# Patient Record
Sex: Male | Born: 1950 | Race: White | Hispanic: No | Marital: Married | Smoking: Former smoker
Health system: Southern US, Community
[De-identification: ages and names within clinical notes are randomized; demographics above are authoritative.]

## PROBLEM LIST (undated history)

## (undated) DIAGNOSIS — Z87442 Personal history of urinary calculi: Secondary | ICD-10-CM

## (undated) DIAGNOSIS — K219 Gastro-esophageal reflux disease without esophagitis: Secondary | ICD-10-CM

## (undated) DIAGNOSIS — I4891 Unspecified atrial fibrillation: Secondary | ICD-10-CM

## (undated) DIAGNOSIS — I350 Nonrheumatic aortic (valve) stenosis: Secondary | ICD-10-CM

## (undated) DIAGNOSIS — H33311 Horseshoe tear of retina without detachment, right eye: Secondary | ICD-10-CM

## (undated) DIAGNOSIS — C801 Malignant (primary) neoplasm, unspecified: Secondary | ICD-10-CM

## (undated) DIAGNOSIS — G8929 Other chronic pain: Secondary | ICD-10-CM

## (undated) DIAGNOSIS — R609 Edema, unspecified: Secondary | ICD-10-CM

## (undated) DIAGNOSIS — R197 Diarrhea, unspecified: Secondary | ICD-10-CM

## (undated) DIAGNOSIS — I255 Ischemic cardiomyopathy: Secondary | ICD-10-CM

## (undated) DIAGNOSIS — I209 Angina pectoris, unspecified: Secondary | ICD-10-CM

## (undated) DIAGNOSIS — I10 Essential (primary) hypertension: Secondary | ICD-10-CM

## (undated) DIAGNOSIS — M549 Dorsalgia, unspecified: Secondary | ICD-10-CM

## (undated) DIAGNOSIS — M1990 Unspecified osteoarthritis, unspecified site: Secondary | ICD-10-CM

## (undated) DIAGNOSIS — I4901 Ventricular fibrillation: Secondary | ICD-10-CM

## (undated) DIAGNOSIS — IMO0001 Reserved for inherently not codable concepts without codable children: Secondary | ICD-10-CM

## (undated) DIAGNOSIS — Z973 Presence of spectacles and contact lenses: Secondary | ICD-10-CM

## (undated) DIAGNOSIS — J986 Disorders of diaphragm: Secondary | ICD-10-CM

## (undated) DIAGNOSIS — E119 Type 2 diabetes mellitus without complications: Secondary | ICD-10-CM

## (undated) DIAGNOSIS — E785 Hyperlipidemia, unspecified: Secondary | ICD-10-CM

## (undated) DIAGNOSIS — I219 Acute myocardial infarction, unspecified: Secondary | ICD-10-CM

## (undated) DIAGNOSIS — R011 Cardiac murmur, unspecified: Secondary | ICD-10-CM

## (undated) DIAGNOSIS — I2510 Atherosclerotic heart disease of native coronary artery without angina pectoris: Secondary | ICD-10-CM

## (undated) HISTORY — PX: BACK SURGERY: SHX140

## (undated) HISTORY — PX: CORONARY STENT PLACEMENT: SHX1402

## (undated) HISTORY — PX: LITHOTRIPSY: SUR834

## (undated) HISTORY — PX: TONSILLECTOMY: SUR1361

## (undated) HISTORY — PX: HAND SURGERY: SHX662

## (undated) HISTORY — DX: Malignant (primary) neoplasm, unspecified: C80.1

## (undated) HISTORY — PX: EYE EXAMINATION UNDER ANESTHESIA W/ RETINAL CRYOTHERAPY AND RETINAL LASER: SHX1561

## (undated) HISTORY — PX: CARDIAC CATHETERIZATION: SHX172

---

## 1995-05-31 HISTORY — PX: BACK SURGERY: SHX140

## 1997-10-30 ENCOUNTER — Ambulatory Visit (HOSPITAL_COMMUNITY): Admission: RE | Admit: 1997-10-30 | Discharge: 1997-10-30 | Payer: Self-pay | Admitting: Neurosurgery

## 2001-11-15 ENCOUNTER — Other Ambulatory Visit: Admission: RE | Admit: 2001-11-15 | Discharge: 2001-11-15 | Payer: Self-pay | Admitting: Dermatology

## 2003-05-31 DIAGNOSIS — I219 Acute myocardial infarction, unspecified: Secondary | ICD-10-CM

## 2003-05-31 HISTORY — DX: Acute myocardial infarction, unspecified: I21.9

## 2003-06-23 ENCOUNTER — Inpatient Hospital Stay (HOSPITAL_COMMUNITY): Admission: EM | Admit: 2003-06-23 | Discharge: 2003-06-30 | Payer: Self-pay | Admitting: *Deleted

## 2005-07-14 DIAGNOSIS — M545 Low back pain, unspecified: Secondary | ICD-10-CM | POA: Insufficient documentation

## 2006-12-08 DIAGNOSIS — R079 Chest pain, unspecified: Secondary | ICD-10-CM | POA: Insufficient documentation

## 2012-10-10 ENCOUNTER — Inpatient Hospital Stay (HOSPITAL_COMMUNITY)
Admission: AD | Admit: 2012-10-10 | Discharge: 2012-10-13 | DRG: 247 | Disposition: A | Discharge status: Home / Self Care | Payer: Medicare Other | Source: Other Acute Inpatient Hospital | Attending: Cardiology | Admitting: Cardiology

## 2012-10-10 ENCOUNTER — Encounter (HOSPITAL_COMMUNITY): Payer: Self-pay | Admitting: Family Medicine

## 2012-10-10 DIAGNOSIS — I214 Non-ST elevation (NSTEMI) myocardial infarction: Secondary | ICD-10-CM

## 2012-10-10 DIAGNOSIS — R001 Bradycardia, unspecified: Secondary | ICD-10-CM | POA: Diagnosis present

## 2012-10-10 DIAGNOSIS — Z8674 Personal history of sudden cardiac arrest: Secondary | ICD-10-CM

## 2012-10-10 DIAGNOSIS — I10 Essential (primary) hypertension: Secondary | ICD-10-CM

## 2012-10-10 DIAGNOSIS — I2510 Atherosclerotic heart disease of native coronary artery without angina pectoris: Principal | ICD-10-CM | POA: Diagnosis present

## 2012-10-10 DIAGNOSIS — Z955 Presence of coronary angioplasty implant and graft: Secondary | ICD-10-CM

## 2012-10-10 DIAGNOSIS — Z87891 Personal history of nicotine dependence: Secondary | ICD-10-CM

## 2012-10-10 DIAGNOSIS — Z9861 Coronary angioplasty status: Secondary | ICD-10-CM | POA: Diagnosis present

## 2012-10-10 DIAGNOSIS — I200 Unstable angina: Secondary | ICD-10-CM | POA: Diagnosis present

## 2012-10-10 DIAGNOSIS — I252 Old myocardial infarction: Secondary | ICD-10-CM

## 2012-10-10 DIAGNOSIS — I2589 Other forms of chronic ischemic heart disease: Secondary | ICD-10-CM | POA: Diagnosis present

## 2012-10-10 HISTORY — DX: Ischemic cardiomyopathy: I25.5

## 2012-10-10 HISTORY — DX: Angina pectoris, unspecified: I20.9

## 2012-10-10 HISTORY — DX: Atherosclerotic heart disease of native coronary artery without angina pectoris: I25.10

## 2012-10-10 HISTORY — DX: Unspecified osteoarthritis, unspecified site: M19.90

## 2012-10-10 HISTORY — DX: Disorders of diaphragm: J98.6

## 2012-10-10 HISTORY — DX: Acute myocardial infarction, unspecified: I21.9

## 2012-10-10 HISTORY — DX: Ventricular fibrillation: I49.01

## 2012-10-10 MED ORDER — NITROGLYCERIN 2 % TD OINT
0.5000 [in_us] | TOPICAL_OINTMENT | Freq: Four times a day (QID) | TRANSDERMAL | Status: DC
  Administered 2012-10-10 – 2012-10-11 (×2): 0.5 [in_us] via TOPICAL
  Filled 2012-10-10: qty 30

## 2012-10-10 MED ORDER — SODIUM CHLORIDE 0.9 % IJ SOLN: 3.0000 mL | INTRAMUSCULAR | Status: DC | PRN

## 2012-10-10 MED ORDER — SODIUM CHLORIDE 0.9 % IV SOLN
1.0000 mL/kg/h | INTRAVENOUS | Status: DC
  Administered 2012-10-11: 1 mL/kg/h via INTRAVENOUS

## 2012-10-10 MED ORDER — NITROGLYCERIN 0.4 MG SL SUBL: 0.4000 mg | SUBLINGUAL_TABLET | SUBLINGUAL | Status: DC | PRN

## 2012-10-10 MED ORDER — ONDANSETRON HCL 4 MG/2ML IJ SOLN: 4.0000 mg | Freq: Four times a day (QID) | INTRAMUSCULAR | Status: DC | PRN

## 2012-10-10 MED ORDER — ACETAMINOPHEN 325 MG PO TABS: 650.0000 mg | ORAL_TABLET | ORAL | Status: DC | PRN

## 2012-10-10 MED ORDER — SODIUM CHLORIDE 0.9 % IJ SOLN
3.0000 mL | Freq: Two times a day (BID) | INTRAMUSCULAR | Status: DC
  Administered 2012-10-10 – 2012-10-11 (×2): 3 mL via INTRAVENOUS

## 2012-10-10 MED ORDER — ASPIRIN 300 MG RE SUPP
300.0000 mg | RECTAL | Status: AC
  Filled 2012-10-10: qty 1

## 2012-10-10 MED ORDER — METOPROLOL TARTRATE 12.5 MG HALF TABLET
12.5000 mg | ORAL_TABLET | Freq: Four times a day (QID) | ORAL | Status: DC
  Administered 2012-10-10: 12.5 mg via ORAL
  Filled 2012-10-10 (×6): qty 1

## 2012-10-10 MED ORDER — ASPIRIN EC 81 MG PO TBEC
81.0000 mg | DELAYED_RELEASE_TABLET | Freq: Every day | ORAL | Status: DC
  Administered 2012-10-12 – 2012-10-13 (×2): 81 mg via ORAL
  Filled 2012-10-10 (×2): qty 1

## 2012-10-10 MED ORDER — ASPIRIN 81 MG PO CHEW
324.0000 mg | CHEWABLE_TABLET | ORAL | Status: AC
  Administered 2012-10-11: 324 mg via ORAL
  Filled 2012-10-10 (×2): qty 4
  Filled 2012-10-10: qty 1

## 2012-10-10 MED ORDER — ATORVASTATIN CALCIUM 80 MG PO TABS
80.0000 mg | ORAL_TABLET | Freq: Every day | ORAL | Status: DC
  Administered 2012-10-10 – 2012-10-12 (×3): 80 mg via ORAL
  Filled 2012-10-10 (×4): qty 1

## 2012-10-10 MED ORDER — SODIUM CHLORIDE 0.9 % IV SOLN: 250.0000 mL | INTRAVENOUS | Status: DC | PRN

## 2012-10-10 MED ORDER — ASPIRIN 81 MG PO CHEW
324.0000 mg | CHEWABLE_TABLET | ORAL | Status: AC
  Administered 2012-10-10: 324 mg via ORAL
  Filled 2012-10-10: qty 4

## 2012-10-10 MED ORDER — RAMIPRIL 10 MG PO CAPS
10.0000 mg | ORAL_CAPSULE | Freq: Every day | ORAL | Status: DC
  Filled 2012-10-10: qty 1

## 2012-10-10 NOTE — Progress Notes (Signed)
ANTICOAGULATION CONSULT NOTE - Initial Consult  Pharmacy Consult for heparin Indication: chest pain/ACS  Allergies  Allergen Reactions  . Penicillins Swelling    Patient Measurements: Height: 5\' 11"  (180.3 cm) Weight: 249 lb 9 oz (113.2 kg) IBW/kg (Calculated) : 75.3 Heparin Dosing Weight: 100 kg  Vital Signs: Temp: 98.5 F (36.9 C) (05/14 2245) Temp src: Oral (05/14 2245) BP: 137/76 mmHg (05/14 2245) Pulse Rate: 58 (05/14 2245)  Labs: No results found for this basename: HGB, HCT, PLT, APTT, LABPROT, INR, HEPARINUNFRC, CREATININE, CKTOTAL, CKMB, TROPONINI,  in the last 72 hours  CrCl is unknown because no creatinine reading has been taken.   Medical History: Past Medical History  Diagnosis Date  . Anginal pain   . Arthritis   . Myocardial infarction     2004, 2005, cardiac arrest    Medications:  Scheduled:  . aspirin  324 mg Oral NOW   Or  . aspirin  300 mg Rectal NOW  . [START ON 10/11/2012] aspirin  324 mg Oral Pre-Cath  . [START ON 10/12/2012] aspirin EC  81 mg Oral Daily  . [START ON 10/11/2012] atorvastatin  80 mg Oral q1800  . [START ON 10/11/2012] metoprolol tartrate  12.5 mg Oral Q6H  . [START ON 10/11/2012] nitroGLYCERIN  0.5 inch Topical Q6H  . [START ON 10/11/2012] ramipril  10 mg Oral Daily  . sodium chloride  3 mL Intravenous Q12H  . sodium chloride  3 mL Intravenous Q12H    Assessment: 62 yo male transferred from [facility] with possible ACS. Pharmacy to manage heparin. At [facility], patient received heparin 5000 unit IV bolus x 1, then heparin infusion started at 1000 units/hr at ~ 20:00. Baseline labs from [facility] include Hgb 14.5,  Plt 178, INR 1.1. Plan for possible LHC tomorrow.   Goal of Therapy:  Heparin level 0.3-0.7 units/ml Monitor platelets by anticoagulation protocol: Yes   Plan:  1. Increase IV heparin to 1350 units/hr.  2. Heparin level in 6 hours 3. Daily CBC, heparin level  Emeline Gins 10/10/2012,11:46 PM

## 2012-10-10 NOTE — H&P (Signed)
Physician History and Physical    Kaj Vasil MRN: [id number] DOB/AGE: 1950-08-21 62 y.o. Admit date: 10/10/2012  HPI:  62 yo with history of CAD s/p LAD PCI in 2005 and 2006 presents with symptoms concerning for unstable angina.  Patient had an anterior STEMI in 2005 complicated by vfib arrest.  PCI to LAD was done at [facility].  He then had stent thrombosis that was treated with DES at [facility] in 2006.  He has done well since that time.  About a month ago, he had a brief episode of chest pain (does not remember the circumstances).   1 week ago, he stopped aspirin and Plavix for a skin surgery that he had today (mole removal).  Today, after the surgery, he was home washing his car.  While doing this, he developed substernal chest pain that radiated up to his jaw.  This pain lasted for about 1.5 hours and resolved in the ER at [facility] after getting NTG.  He is currently chest pain-free and has been started on heparin gtt.  Initial TnI was negative.  He does not smoke now (rare cigar).    Review of systems complete and found to be negative unless listed above   PMH: 1. HTN 2. CAD: Anterior STEMI 2005 complicated by vfib arrest.  Patient had PCI to LAD at [facility].  In 2006, patient had stent thrombosis and had DES within prior LAD stent at [facility].   3. Ischemic cardiomyopathy: No record of EF, but patient reports that he was told that his heart was "weakened" by MI.   Family History:  Mother with "heart trouble."    History   Social History  . Marital Status: Divorced    Spouse Name: N/A    Number of Children: N/A  . Years of Education: N/A   Occupational History  . Not on file.   Social History Main Topics  . Smoking status: Former Smoker    Types: Cigars  . Smokeless tobacco: Not on file  . Alcohol Use: No  . Drug Use: No  . Sexually Active: Not on file   Other Topics Concern  . Not on file   Social History Narrative  . Retired, lives in [place].     Home  Meds: Plavix 75 mg daily (off x 1 week) ASA 81 mg daily (off x 1 week) Ramipril 10 mg daily Crestor 10 mg daily  Physical Exam: Blood pressure 137/76, pulse 58, temperature 98.5 F (36.9 C), temperature source Oral, resp. rate 19, height 5\' 11"  (1.803 m), weight 249 lb 9 oz (113.2 kg), SpO2 94.00%.  General: NAD Neck: No JVD, no thyromegaly or thyroid nodule.  Lungs: Clear to auscultation bilaterally with normal respiratory effort. CV: Nondisplaced PMI.  Heart regular S1/S2, no S3/S4, no murmur.  No peripheral edema.  No carotid bruit.  Normal pedal pulses.  Abdomen: Soft, nontender, no hepatosplenomegaly, no distention.  Skin: Intact without lesions or rashes.  Neurologic: Alert and oriented x 3.  Psych: Normal affect. Extremities: No clubbing or cyanosis.  HEENT: Normal.   Labs: K 3.7, creatinine 0.88, ALT 45, AST 40, TnI 0.02, BNP 34, INR 1.1, HCT 43.5, WBCs 10, plts 178    EKG: NSR, old anteroseptal MI, cannot rule out prior IMI.   ASSESSMENT AND PLAN:  62 yo with history of anterior STEMI in 2005 and LAD stent thrombosis in 2006 presents with symptoms concerning for unstable angina.   - Plan for [facility] tomorrow.  - Cycle cardiac  enzymes and ECGs.  - Check echocardiogram to assess EF given history of anterior MI.  - Heparin gtt, ASA, atorvastatin 80, NTG paste, metoprolol 12.5 mg every 6 hrs, continue ramipril.  - Lipids in am.  - Hold off on Plavix for now as he is CP-free (has been off x 1 week).  Would likely be Brilinta or Effient candidate if he needs PCI.   Signed: [name] 10/10/2012, 11:24 PM

## 2012-10-11 ENCOUNTER — Encounter (HOSPITAL_COMMUNITY)
Admission: AD | Disposition: A | Discharge status: Home / Self Care | Payer: Self-pay | Source: Other Acute Inpatient Hospital | Attending: Cardiology

## 2012-10-11 DIAGNOSIS — I369 Nonrheumatic tricuspid valve disorder, unspecified: Secondary | ICD-10-CM

## 2012-10-11 DIAGNOSIS — I2510 Atherosclerotic heart disease of native coronary artery without angina pectoris: Secondary | ICD-10-CM

## 2012-10-11 HISTORY — PX: LEFT HEART CATH: SHX5478

## 2012-10-11 LAB — CBC
HCT: 38.9 % — ABNORMAL LOW (ref 39.0–52.0)
MCHC: 34.4 g/dL (ref 30.0–36.0)
MCV: 94 fL (ref 78.0–100.0)
Platelets: 170 10*3/uL (ref 150–400)
RDW: 12.9 % (ref 11.5–15.5)

## 2012-10-11 LAB — BASIC METABOLIC PANEL
BUN: 17 mg/dL (ref 6–23)
Calcium: 9 mg/dL (ref 8.4–10.5)
Creatinine, Ser: 0.98 mg/dL (ref 0.50–1.35)
GFR calc Af Amer: >90 mL/min (ref >90)
GFR calc non Af Amer: 86 mL/min — ABNORMAL LOW (ref >90)

## 2012-10-11 LAB — TROPONIN I
Troponin I: <0.3 ng/mL (ref <0.30)
Troponin I: <0.3 ng/mL (ref <0.30)

## 2012-10-11 LAB — LIPID PANEL
Cholesterol: 129 mg/dL (ref 0–200)
HDL: 42 mg/dL (ref >39)
Total CHOL/HDL Ratio: 3.1 RATIO

## 2012-10-11 LAB — HEPARIN LEVEL (UNFRACTIONATED): Heparin Unfractionated: 0.12 IU/mL — ABNORMAL LOW (ref 0.30–0.70)

## 2012-10-11 LAB — MRSA PCR SCREENING: MRSA by PCR: NEGATIVE

## 2012-10-11 SURGERY — LEFT HEART CATH
Anesthesia: Moderate Sedation

## 2012-10-11 MED ORDER — HEPARIN (PORCINE) IN NACL 2-0.9 UNIT/ML-% IJ SOLN
INTRAMUSCULAR | Status: AC
  Filled 2012-10-11: qty 1000

## 2012-10-11 MED ORDER — MIDAZOLAM HCL 2 MG/2ML IJ SOLN
INTRAMUSCULAR | Status: AC
  Filled 2012-10-11: qty 2

## 2012-10-11 MED ORDER — FENTANYL CITRATE 0.05 MG/ML IJ SOLN
INTRAMUSCULAR | Status: AC
  Filled 2012-10-11: qty 2

## 2012-10-11 MED ORDER — SODIUM CHLORIDE 0.9 % IV SOLN
1.0000 mL/kg/h | INTRAVENOUS | Status: AC
  Administered 2012-10-11: 1.007 mL/kg/h via INTRAVENOUS

## 2012-10-11 MED ORDER — NITROGLYCERIN IN D5W 200-5 MCG/ML-% IV SOLN
INTRAVENOUS | Status: AC
  Filled 2012-10-11: qty 250

## 2012-10-11 MED ORDER — SODIUM CHLORIDE 0.9 % IJ SOLN
3.0000 mL | Freq: Two times a day (BID) | INTRAMUSCULAR | Status: DC
  Administered 2012-10-11 – 2012-10-13 (×4): 3 mL via INTRAVENOUS

## 2012-10-11 MED ORDER — MORPHINE SULFATE 2 MG/ML IJ SOLN
INTRAMUSCULAR | Status: AC
  Filled 2012-10-11: qty 2

## 2012-10-11 MED ORDER — POTASSIUM CHLORIDE CRYS ER 20 MEQ PO TBCR
40.0000 meq | EXTENDED_RELEASE_TABLET | Freq: Once | ORAL | Status: AC
  Administered 2012-10-11: 40 meq via ORAL
  Filled 2012-10-11: qty 2

## 2012-10-11 MED ORDER — NITROGLYCERIN 1 MG/10 ML FOR IR/CATH LAB
INTRA_ARTERIAL | Status: AC
  Filled 2012-10-11: qty 10

## 2012-10-11 MED ORDER — GUAIFENESIN 100 MG/5ML PO SYRP
100.0000 mg | ORAL_SOLUTION | Freq: Once | ORAL | Status: AC
  Administered 2012-10-11: 100 mg via ORAL
  Filled 2012-10-11: qty 118

## 2012-10-11 MED ORDER — HEPARIN (PORCINE) IN NACL 100-0.45 UNIT/ML-% IJ SOLN
1700.0000 [IU]/h | INTRAMUSCULAR | Status: DC
  Administered 2012-10-11: 1350 [IU]/h via INTRAVENOUS
  Filled 2012-10-11 (×2): qty 250

## 2012-10-11 MED ORDER — SODIUM CHLORIDE 0.9 % IJ SOLN
3.0000 mL | INTRAMUSCULAR | Status: DC | PRN
  Administered 2012-10-11: 3 mL via INTRAVENOUS

## 2012-10-11 MED ORDER — SODIUM CHLORIDE 0.9 % IV SOLN: 250.0000 mL | INTRAVENOUS | Status: DC | PRN

## 2012-10-11 MED ORDER — SODIUM CHLORIDE 0.9 % IV SOLN
0.2500 mg/kg/h | INTRAVENOUS | Status: AC
  Administered 2012-10-11: 0.25 mg/kg/h via INTRAVENOUS
  Filled 2012-10-11: qty 250

## 2012-10-11 MED ORDER — RAMIPRIL 2.5 MG PO CAPS
2.5000 mg | ORAL_CAPSULE | Freq: Every day | ORAL | Status: DC
  Administered 2012-10-11: 2.5 mg via ORAL
  Filled 2012-10-11 (×2): qty 1

## 2012-10-11 MED ORDER — LIDOCAINE HCL (PF) 1 % IJ SOLN
INTRAMUSCULAR | Status: AC
  Filled 2012-10-11: qty 30

## 2012-10-11 MED ORDER — TICAGRELOR 90 MG PO TABS
90.0000 mg | ORAL_TABLET | Freq: Two times a day (BID) | ORAL | Status: DC
  Administered 2012-10-11 – 2012-10-13 (×4): 90 mg via ORAL
  Filled 2012-10-11 (×7): qty 1

## 2012-10-11 MED ORDER — BIVALIRUDIN 250 MG IV SOLR
INTRAVENOUS | Status: AC
  Filled 2012-10-11: qty 250

## 2012-10-11 MED ORDER — TICAGRELOR 90 MG PO TABS
ORAL_TABLET | ORAL | Status: AC
  Filled 2012-10-11: qty 2

## 2012-10-11 MED ORDER — MORPHINE SULFATE 4 MG/ML IJ SOLN: 4.0000 mg | Freq: Once | INTRAMUSCULAR | Status: DC

## 2012-10-11 NOTE — Progress Notes (Signed)
Patient ID: Hayden Rivera, male   DOB: 1950-11-10, 62 y.o.   MRN: [id number]    SUBJECTIVE: No further chest pain.  No complaints this morning.  HR is running upper 40s-50s. Cardiac enzymes negative.   Marland Kitchen aspirin  324 mg Oral Pre-Cath  . [START ON 10/12/2012] aspirin EC  81 mg Oral Daily  . atorvastatin  80 mg Oral q1800  . nitroGLYCERIN  0.5 inch Topical Q6H  . potassium chloride  40 mEq Oral Once  . ramipril  2.5 mg Oral Daily  . sodium chloride  3 mL Intravenous Q12H  . sodium chloride  3 mL Intravenous Q12H      Filed Vitals:   10/11/12 0200 10/11/12 0238 10/11/12 0349 10/11/12 0600  BP: 103/59  106/65 98/60  Pulse: 51 49 49 58  Temp:   98 F (36.7 C)   TempSrc:   Oral   Resp: 17 17 19 15   Height:      Weight:      SpO2: 96% 96% 97% 92%    Intake/Output Summary (Last 24 hours) at 10/11/12 0744 Last data filed at 10/11/12 0600  Gross per 24 hour  Intake 330.63 ml  Output    350 ml  Net -19.37 ml    LABS: Basic Metabolic Panel:  Recent Labs  40/98/11 0545  NA 141  K 3.4*  CL 105  CO2 24  GLUCOSE 103*  BUN 17  CREATININE 0.98  CALCIUM 9.0   Liver Function Tests: No results found for this basename: AST, ALT, ALKPHOS, BILITOT, PROT, ALBUMIN,  in the last 72 hours No results found for this basename: LIPASE, AMYLASE,  in the last 72 hours CBC:  Recent Labs  10/11/12 0545  WBC 9.6  HGB 13.4  HCT 38.9*  MCV 94.0  PLT 170   Cardiac Enzymes:  Recent Labs  10/10/12 2358 10/11/12 0545  TROPONINI <0.30 <0.30   BNP: No components found with this basename: POCBNP,  D-Dimer: No results found for this basename: DDIMER,  in the last 72 hours Hemoglobin A1C: No results found for this basename: HGBA1C,  in the last 72 hours Fasting Lipid Panel:  Recent Labs  10/11/12 0545  CHOL 129  HDL 42  LDLCALC 55  TRIG 160*  CHOLHDL 3.1   Thyroid Function Tests: No results found for this basename: TSH, T4TOTAL, FREET3, T3FREE, THYROIDAB,  in the last 72  hours Anemia Panel: No results found for this basename: VITAMINB12, FOLATE, FERRITIN, TIBC, IRON, RETICCTPCT,  in the last 72 hours  RADIOLOGY: No results found.  PHYSICAL EXAM General: NAD Neck: No JVD, no thyromegaly or thyroid nodule.  Lungs: Clear to auscultation bilaterally with normal respiratory effort. CV: Nondisplaced PMI.  Heart regular S1/S2, no S3/S4, no murmur.  No peripheral edema.   Abdomen: Soft, nontender, no hepatosplenomegaly, no distention.  Neurologic: Alert and oriented x 3.  Psych: Normal affect. Extremities: No clubbing or cyanosis.   TELEMETRY: Reviewed telemetry pt in NSR  ASSESSMENT AND PLAN: 62 yo with history of anterior STEMI in 2005 and LAD stent thrombosis in 2006 presents with symptoms concerning for unstable angina.  Cardiac enzymes negative.  - Plan for LHC today  - Check echocardiogram to assess EF given history of anterior MI.  - Heparin gtt, ASA, atorvastatin 80, NTG paste.  - Stop metoprolol given bradycardia - Will cut back home dose of ramipril for now as SBP is in the 90s-100s with NTG paste on.   - Hold off on Plavix  for now as he is CP-free (has been off x 1 week). Would likely be Brilinta or Effient candidate if he needs PCI.   [name] 10/11/2012 7:47 AM

## 2012-10-11 NOTE — Progress Notes (Signed)
Nurse contacted Cath lab to gain clarity for need of additional IV site  (Pt currently has 2 IV sites, left hand and right wrist).  Per Cath lab, nurse should not remove wrist site at this time, cath lab will assess and intervene if necessary prior to procedure.

## 2012-10-11 NOTE — Progress Notes (Signed)
HR sustaining between 47-53 bpm. BP 98/60. Metoprolol held this am. PA notified. Will monitor.  M.Foster Simpson, RN

## 2012-10-11 NOTE — Progress Notes (Signed)
Pt had prolonged vagal episode post-PCI. Did not require atropine, but was treated with IV fluids. Now feeling much better. EKG without acute ST changes. Still with mild residual 'soreness.' Will cycle CKMB's and f/u in am.  Tonny Bollman 10/11/2012 5:30 PM

## 2012-10-11 NOTE — Care Management Note (Signed)
    Page 1 of 1   10/11/2012     3:27:43 PM   CARE MANAGEMENT NOTE 10/11/2012  Patient:  Hayden Rivera, Hayden Rivera   Account Number:  [id number]  Date Initiated:  10/11/2012  Documentation initiated by:  Junius Creamer  Subjective/Objective Assessment:   adm w ch pain, pos cath     Action/Plan:   lives alone, spoke w pt and has medicare and supp ins   Anticipated DC Date:     Anticipated DC Plan:    In-house referral  Artist      DC Planning Services  CM consult  Medication Assistance      Choice offered to / List presented to:             Status of service:   Medicare Important Message given?   (If response is "NO", the following Medicare IM given date fields will be blank) Date Medicare IM given:   Date Additional Medicare IM given:    Discharge Disposition:    Per UR Regulation:  Reviewed for med. necessity/level of care/duration of stay  If discussed at Long Length of Stay Meetings, dates discussed:    Comments:  5/15 1526 Hayden Hayden Searight rn,bsn spoke w pt and gave him brilinta 30day free card. pt states has medicare and supplement. alerted fin co that pt has medicare.

## 2012-10-11 NOTE — Progress Notes (Signed)
ANTICOAGULATION CONSULT NOTE - Follow Up Consult  Pharmacy Consult for heparin Indication: chest pain/ACS  Allergies  Allergen Reactions  . Penicillins Swelling    Patient Measurements: Height: 5\' 11"  (180.3 cm) Weight: 249 lb 9 oz (113.2 kg) IBW/kg (Calculated) : 75.3 Heparin Dosing Weight: 100 kg  Vital Signs: Temp: 98 F (36.7 C) (05/15 0349) Temp src: Oral (05/15 0349) BP: 98/60 mmHg (05/15 0600) Pulse Rate: 58 (05/15 0600)  Labs:  Recent Labs  10/10/12 2358 10/11/12 0545  HGB  --  13.4  HCT  --  38.9*  PLT  --  170  LABPROT  --  14.5  INR  --  1.15  HEPARINUNFRC  --  0.12*  TROPONINI <0.30  --     CrCl is unknown because no creatinine reading has been taken.   Medical History: Past Medical History  Diagnosis Date  . Anginal pain   . Arthritis   . Myocardial infarction     2004, 2005, cardiac arrest    Medications:  Scheduled:  . aspirin  324 mg Oral Pre-Cath  . [START ON 10/12/2012] aspirin EC  81 mg Oral Daily  . atorvastatin  80 mg Oral q1800  . metoprolol tartrate  12.5 mg Oral Q6H  . nitroGLYCERIN  0.5 inch Topical Q6H  . ramipril  10 mg Oral Daily  . sodium chloride  3 mL Intravenous Q12H  . sodium chloride  3 mL Intravenous Q12H    Assessment: 62 yo male transferred from [facility] with possible ACS. Pharmacy to manage heparin. Heparin level (0.12) is below-goal on 1350 units/hr. No problem with line / infusion and no bleeding per RN. Plan for possible LHC later today.   Goal of Therapy:  Heparin level 0.3-0.7 units/ml Monitor platelets by anticoagulation protocol: Yes   Plan:  1. Increase IV heparin to 1700 units/hr.  2. Heparin level in 6 hours vs follow-up post-cath.   Emeline Gins 10/11/2012,7:04 AM

## 2012-10-11 NOTE — Progress Notes (Signed)
  Echocardiogram 2D Echocardiogram has been performed.  Georgian Co 10/11/2012, 10:09 AM

## 2012-10-11 NOTE — CV Procedure (Signed)
Cardiac Catheterization Procedure Note  Name: Hayden Rivera MRN: [id number] DOB: 1950-12-09  Procedure: Left Heart Cath, Selective Coronary Angiography, LV angiography, IVUS, PTCA and stenting of the LAD, PTCA and stenting of the LCx  Indication: Unstable angina. 62 year old gentleman with history of coronary artery disease. The patient had an anterior wall MI in 2005. He had stent thrombosis in 2006. His stent thrombosis event was treated in [facility]. He apparently underwent repeat stenting but I do not have the records. He has been maintained on long-term dual antiplatelet therapy with aspirin and Plavix. These medications were stopped about one week ago for a skin surgery. He developed chest pain radiating to the neck and shoulders, symptoms consistent with unstable angina. He was referred for cardiac catheterization and possible PCI.  Procedural Details:  The right wrist was prepped, draped, and anesthetized with 1% lidocaine. Using the modified Seldinger technique, a 5/6 French sheath was introduced into the right radial artery. 3 mg of verapamil was administered through the sheath, weight-based unfractionated heparin was administered intravenously. Standard Judkins catheters were used for selective coronary angiography and left ventriculography. Catheter exchanges were performed over an exchange length guidewire.  PROCEDURAL FINDINGS Hemodynamics: AO 99/60 LV 99/20   Coronary angiography: Coronary dominance: right  Left mainstem: Patent with mild to moderate calcification. There is a 30% distal left main stenosis.  Left anterior descending (LAD): The vessel is heavily calcified. The proximal LAD has diffuse calcific 50% stenosis. The stented segment has severe 95% eccentric stenosis with the appearance of a filling defect. The findings are suggestive of thrombus. The mid and distal LAD have mild segmental disease but no areas of high-grade stenosis.  Left circumflex (LCx):  The left circumflex is a large caliber vessel. There is an intermediate branch with 60% ostial stenosis. The AV circumflex gives off a single large OM. The mid circumflex has 80% stenosis. The OM divides into twin vessels without significant stenoses.  Right coronary artery (RCA): Large, dominant vessel. The mid vessel has 40-50% stenosis. There are no areas of high-grade disease noted. The PDA and PLA branches are widely patent.  Left ventriculography: Left ventricular systolic function is normal, LVEF is estimated at 55%, there is no significant mitral regurgitation   Intravascular ultrasound findings: The stented segment of the LAD is patent. The intravascular ultrasound was performed after predilatation. There was residual circumferential neointimal proliferation through the proximal portion of the stent. There was some irregularity but no clear evidence of thrombus. Proximal to the LAD stent, there is heavy circumflex frontal calcification with at least moderate stenosis. The left main has nonobstructive stenosis distally and is widely patent through the proximal and mid vessel.  PCI Note:  Following the diagnostic procedure, the decision was made to proceed with PCI. The patient was preloaded with brilinta 180 mg. We confirmed that he had no history of major bleeding problems and no plans for upcoming surgeries. He's been compliant with his medical therapies. Weight-based bivalirudin was given for anticoagulation. Once a therapeutic ACT was achieved, a 6 [place] XB LAD 3.5 cm guide catheter was inserted.  I plan on treating both the severe stenosis in the proximal LAD as well as the mid left circumflex. I plan on using intravascular ultrasound to help guide treatment for the severe stenosis in the stented segment of the LAD. A pro-water coronary guidewire was used to cross the lesion.  The lesion was predilated with a 3.0 x 12 mm noncompliant balloon.  The segment was then evaluated  with intravascular  ultrasound using a mechanical pullback after administration of intracoronary nitroglycerin. This demonstrated wide patency of the distal portion of the stented segment. There was a good transition zone distally. The mid and proximal segment of the stent had evidence of severe in-stent restenosis with neointimal proliferation. The stented segment was then redilated with a 3.0 x 10 mm cutting balloon to high pressure. Final angiography demonstrated an excellent result with less than 20% residual stenosis. Because there were already overlapping layers of stent, I elected to treat this with angioplasty alone. Attention was then turned to the left circumflex. The vessel was wired with a moderate amount of difficulty using the same pro-water wire. The lesion was dilated with the 3.0 [place] sprinter balloon to 8 atmospheres. The lesion was then stented with a 3.0 x 20 mm Promus premier drug-eluting stent. The stent was very difficult to deliver but eventually I was able to advance it across the lesion. The stented segment was postdilated with the same 3.0 [place] sprinter balloon to 18 atmospheres. Final angiography confirmed an excellent result. The patient tolerated the procedure well. There were no immediate procedural complications. A TR band was used for radial hemostasis. The patient was transferred to the post catheterization recovery area for further monitoring.  PCI Data: Lesion 1: Vessel - LAD/Segment - proximal Percent Stenosis (pre)  95 TIMI-flow 3 Stent none Percent Stenosis (post) 20 TIMI-flow (post) 3  Lesion 2: Vessel - left circumflex/Segment - mid Percent Stenosis (pre)  80 TIMI-flow 3 Stent 3.0 x 20 mm Promus premier drug-eluting Percent Stenosis (post) 0 TIMI-flow (post) 3  Final Conclusions:   1. Severe stenosis of the mid LAD involving the previously stented segment with successful PCI using balloon angioplasty alone with guidance of intravascular ultrasound  2. Severe stenosis of the mid  left circumflex treated successfully with a drug-eluting stent platform  3. Mild nonobstructive right coronary artery stenosis  4. Preserved left ventricular systolic function with an estimated left ventricular ejection fraction of 55%   Recommendations:  Dual antiplatelet therapy with aspirin and brilinta for at least 12 months. 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 Would be reasonable to change back to Plavix after 12 months  Tonny Bollman 10/11/2012, 12:25 PM

## 2012-10-11 NOTE — Progress Notes (Signed)
Pt was transported to cath lab at approximately 1020.  Pt was alert and oriented prior to transport without questions or concerns.  Pt family was at bedside during transport, personal belongings were carried by family.  Heparin drip was stopped on call from lab as ordered.

## 2012-10-11 NOTE — Plan of Care (Signed)
Problem: Phase I Progression Outcomes Goal: Hemodynamically stable Outcome: Not Progressing SB with soft BP.  MD aware

## 2012-10-12 ENCOUNTER — Inpatient Hospital Stay (HOSPITAL_COMMUNITY): Payer: Medicare Other

## 2012-10-12 DIAGNOSIS — I10 Essential (primary) hypertension: Secondary | ICD-10-CM

## 2012-10-12 DIAGNOSIS — I200 Unstable angina: Secondary | ICD-10-CM | POA: Diagnosis present

## 2012-10-12 DIAGNOSIS — I2510 Atherosclerotic heart disease of native coronary artery without angina pectoris: Secondary | ICD-10-CM | POA: Diagnosis present

## 2012-10-12 DIAGNOSIS — R001 Bradycardia, unspecified: Secondary | ICD-10-CM | POA: Diagnosis present

## 2012-10-12 DIAGNOSIS — Z9861 Coronary angioplasty status: Secondary | ICD-10-CM | POA: Diagnosis present

## 2012-10-12 LAB — CBC
HCT: 39.8 % (ref 39.0–52.0)
Hemoglobin: 14 g/dL (ref 13.0–17.0)
MCH: 32.9 pg (ref 26.0–34.0)
MCHC: 35.2 g/dL (ref 30.0–36.0)
MCV: 93.6 fL (ref 78.0–100.0)

## 2012-10-12 LAB — TROPONIN I: Troponin I: 3.99 ng/mL (ref <0.30)

## 2012-10-12 LAB — BASIC METABOLIC PANEL
BUN: 12 mg/dL (ref 6–23)
Chloride: 105 mEq/L (ref 96–112)
Glucose, Bld: 99 mg/dL (ref 70–99)
Potassium: 3.7 mEq/L (ref 3.5–5.1)

## 2012-10-12 LAB — CK TOTAL AND CKMB (NOT AT ARMC)
CK, MB: 20.1 ng/mL (ref 0.3–4.0)
Relative Index: 5.8 — ABNORMAL HIGH (ref 0.0–2.5)
Total CK: 347 U/L — ABNORMAL HIGH (ref 7–232)
Total CK: 389 U/L — ABNORMAL HIGH (ref 7–232)

## 2012-10-12 MED ORDER — ALPRAZOLAM 0.25 MG PO TABS
0.2500 mg | ORAL_TABLET | Freq: Two times a day (BID) | ORAL | Status: DC | PRN
  Administered 2012-10-13: 0.25 mg via ORAL
  Filled 2012-10-12: qty 1

## 2012-10-12 MED ORDER — HEPARIN SODIUM (PORCINE) 5000 UNIT/ML IJ SOLN
5000.0000 [IU] | Freq: Three times a day (TID) | INTRAMUSCULAR | Status: DC
  Administered 2012-10-12 – 2012-10-13 (×3): 5000 [IU] via SUBCUTANEOUS
  Filled 2012-10-12 (×6): qty 1

## 2012-10-12 MED ORDER — RAMIPRIL 5 MG PO CAPS
5.0000 mg | ORAL_CAPSULE | Freq: Every day | ORAL | Status: DC
  Administered 2012-10-12: 5 mg via ORAL
  Filled 2012-10-12 (×2): qty 1

## 2012-10-12 MED ORDER — FLUTICASONE PROPIONATE 50 MCG/ACT NA SUSP
1.0000 | Freq: Every day | NASAL | Status: DC
  Administered 2012-10-12 – 2012-10-13 (×2): 1 via NASAL
  Filled 2012-10-12: qty 16

## 2012-10-12 MED ORDER — ZOLPIDEM TARTRATE 5 MG PO TABS
10.0000 mg | ORAL_TABLET | Freq: Every evening | ORAL | Status: DC | PRN
  Administered 2012-10-12: 5 mg via ORAL
  Filled 2012-10-12: qty 1

## 2012-10-12 MED ORDER — GUAIFENESIN-CODEINE 100-10 MG/5ML PO SOLN
5.0000 mL | ORAL | Status: DC | PRN
  Administered 2012-10-12 – 2012-10-13 (×4): 5 mL via ORAL
  Filled 2012-10-12 (×4): qty 5

## 2012-10-12 MED ORDER — POTASSIUM CHLORIDE CRYS ER 20 MEQ PO TBCR
40.0000 meq | EXTENDED_RELEASE_TABLET | Freq: Once | ORAL | Status: AC
  Administered 2012-10-12: 40 meq via ORAL
  Filled 2012-10-12 (×2): qty 1

## 2012-10-12 MED FILL — Sodium Chloride IV Soln 0.9%: INTRAVENOUS | Qty: 50 | Status: AC

## 2012-10-12 NOTE — Progress Notes (Signed)
Subjective:  The patient underwent Cath yesterday, s/p PCI to LAD and DES to LCx.  The patient notes no chest pain overnight.  He does note mild post-nasal drip.  The patient's HR has stayed in the 50-60's overnight, though with no lightheadedness, dizziness, palpitations, nausea.  CK-MB has increased this morning.  Objective:  Vital Signs in the last 24 hours: Temp:  [97.7 F (36.5 C)-99.4 F (37.4 C)] 98.3 F (36.8 C) (05/16 0400) Pulse Rate:  [45-68] 55 (05/16 0600) Resp:  [12-25] 15 (05/16 0400) BP: (95-143)/(50-85) 131/70 mmHg (05/16 0600) SpO2:  [93 %-99 %] 97 % (05/16 0600) Weight:  [250 lb 10.6 oz (113.7 kg)] 250 lb 10.6 oz (113.7 kg) November 06, 2022 1600)  Intake/Output from previous day: 11-06-22 0701 - 05/16 0700 In: 2841 [P.O.:1080; I.V.:1761] Out: 2477 [Urine:2476; Stool:1]  Physical Exam: General: alert, cooperative, and in no apparent distress HEENT: pupils equal round and reactive to light, vision grossly intact, oropharynx clear and non-erythematous  Neck: supple, no lymphadenopathy, no JVD, no carotid bruits Lungs: clear to ascultation bilaterally, normal work of respiration, no wheezes, rales, ronchi Heart: bradycardic, regular rhythm, no murmurs, gallops, or rubs Abdomen: soft, non-tender, non-distended, normal bowel sounds Extremities: no cyanosis, clubbing, or edema Neurologic: alert & oriented X3, cranial nerves II-XII intact, strength grossly intact, sensation intact to light touch  Lab Results:  Recent Labs  11-05-12 0545 10/12/12 0352  WBC 9.6 12.3*  HGB 13.4 14.0  PLT 170 167    Recent Labs  05-Nov-2012 0545 10/12/12 0352  NA 141 139  K 3.4* 3.7  CL 105 105  CO2 24 22  GLUCOSE 103* 99  BUN 17 12  CREATININE 0.98 0.83    Recent Labs  11-05-12 0545 11/05/12 1504  TROPONINI <0.30 <0.30    Cardiac Studies: Cardiac Catheterization 11-05-12 Hemodynamics:  AO 99/60  LV 99/20  Coronary angiography:  Coronary dominance: right  Left mainstem:  Patent with mild to moderate calcification. There is a 30% distal left main stenosis.  Left anterior descending (LAD): The vessel is heavily calcified. The proximal LAD has diffuse calcific 50% stenosis. The stented segment has severe 95% eccentric stenosis with the appearance of a filling defect. The findings are suggestive of thrombus. The mid and distal LAD have mild segmental disease but no areas of high-grade stenosis.  Left circumflex (LCx): The left circumflex is a large caliber vessel. There is an intermediate branch with 60% ostial stenosis. The AV circumflex gives off a single large OM. The mid circumflex has 80% stenosis. The OM divides into twin vessels without significant stenoses.  Right coronary artery (RCA): Large, dominant vessel. The mid vessel has 40-50% stenosis. There are no areas of high-grade disease noted. The PDA and PLA branches are widely patent.  Left ventriculography: Left ventricular systolic function is normal, LVEF is estimated at 55%, there is no significant mitral regurgitation  Intravascular ultrasound findings: The stented segment of the LAD is patent. The intravascular ultrasound was performed after predilatation. There was residual circumferential neointimal proliferation through the proximal portion of the stent. There was some irregularity but no clear evidence of thrombus. Proximal to the LAD stent, there is heavy circumflex frontal calcification with at least moderate stenosis. The left main has nonobstructive stenosis distally and is widely patent through the proximal and mid vessel.  PCI Note: Following the diagnostic procedure, the decision was made to proceed with PCI. The patient was preloaded with brilinta 180 mg. We confirmed that he had no history of  major bleeding problems and no plans for upcoming surgeries. He's been compliant with his medical therapies. Weight-based bivalirudin was given for anticoagulation. Once a therapeutic ACT was achieved, a 6 [place]  XB LAD 3.5 cm guide catheter was inserted. I plan on treating both the severe stenosis in the proximal LAD as well as the mid left circumflex. I plan on using intravascular ultrasound to help guide treatment for the severe stenosis in the stented segment of the LAD. A pro-water coronary guidewire was used to cross the lesion. The lesion was predilated with a 3.0 x 12 mm noncompliant balloon. The segment was then evaluated with intravascular ultrasound using a mechanical pullback after administration of intracoronary nitroglycerin. This demonstrated wide patency of the distal portion of the stented segment. There was a good transition zone distally. The mid and proximal segment of the stent had evidence of severe in-stent restenosis with neointimal proliferation. The stented segment was then redilated with a 3.0 x 10 mm cutting balloon to high pressure. Final angiography demonstrated an excellent result with less than 20% residual stenosis. Because there were already overlapping layers of stent, I elected to treat this with angioplasty alone. Attention was then turned to the left circumflex. The vessel was wired with a moderate amount of difficulty using the same pro-water wire. The lesion was dilated with the 3.0 [place] sprinter balloon to 8 atmospheres. The lesion was then stented with a 3.0 x 20 mm Promus premier drug-eluting stent. The stent was very difficult to deliver but eventually I was able to advance it across the lesion. The stented segment was postdilated with the same 3.0  sprinter balloon to 18 atmospheres. Final angiography confirmed an excellent result. The patient tolerated the procedure well. There were no immediate procedural complications. A TR band was used for radial hemostasis. The patient was transferred to the post catheterization recovery area for further monitoring.  PCI Data:  Lesion 1:  Vessel - LAD/Segment - proximal  Percent Stenosis (pre) 95  TIMI-flow 3  Stent none  Percent  Stenosis (post) 20  TIMI-flow (post) 3  Lesion 2:  Vessel - left circumflex/Segment - mid  Percent Stenosis (pre) 80  TIMI-flow 3  Stent 3.0 x 20 mm Promus premier drug-eluting  Percent Stenosis (post) 0  TIMI-flow (post) 3  Final Conclusions:  1. Severe stenosis of the mid LAD involving the previously stented segment with successful PCI using balloon angioplasty alone with guidance of intravascular ultrasound  2. Severe stenosis of the mid left circumflex treated successfully with a drug-eluting stent platform  3. Mild nonobstructive right coronary artery stenosis  4. Preserved left ventricular systolic function with an estimated left ventricular ejection fraction of 55%  Recommendations:  Dual antiplatelet therapy with aspirin and brilinta for at least 12 months. 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 Would be reasonable to change back to Plavix after 12 months  Echocardiogram 10/11/12 - Left ventricle: The cavity size was normal. Wall thickness was normal. The estimated ejection fraction was 60%. Wall motion was normal; there were no regional wall motion abnormalities. Left ventricular diastolic function parameters were normal. - Aortic valve: Sclerosis without stenosis.  Tele: Sinus bradycardia, with few PVC's  Assessment/Plan:  The patient is a 62 yo man, history of CAD (s/p PCI to LAD 05, DES to LAD 06), HTN, presenting with chest pain.  # Acute obstructive CAD - the patient presented with symptoms of typical chest pain, with no elevation in troponin, found to have obstructive  CAD, s/p stent with PCI to LAD and DES to LCx.  Immediately following the procedure, the patient experienced an episode of pallor, lightheadedness, which resolved with IVF.  Patient is now chest pain free, though CK-MB has increased this morning. -continue aspirin, brilinta -holding metoprolol for bradycardia -continue atorvastatin -continue ramipril  #  Sinus bradycardia - HR in 50-60's overnight -continue to hold BB  # HTN - BP mostly stable, though as low as 108/69 overnight -continue ramipril  Janalyn Harder, M.D. 10/12/2012, 6:27 AM  Patient seen with resident, agree with the above note.  Will get 1 additional set of cardiac enzymes this morning. He did have a significant peri-procedural CKMB elevation to 20.  He can go to floor, will watch today given peri-procedural infarct and send home in am if remains stable.   [name] 10/12/2012 7:53 AM

## 2012-10-12 NOTE — Progress Notes (Signed)
Pt ambulated hallway independently. Tolerated well. Dion Saucier

## 2012-10-12 NOTE — Progress Notes (Signed)
CARDIAC REHAB PHASE I   PRE:  Rate/Rhythm: 67 SR    BP: sitting 120/70    SaO2:   MODE:  Ambulation: 500 ft   POST:  Rate/Rhythm: 85 SR    BP: sitting 140/74     SaO2:   Tolerated well, no c/o. Ed completed. Pt sts he knows what to do, just needs to do it. Interested in [facility] and will send referral to [facility]. [phone]   Elissa Lovett Bejou CES, ACSM 10/12/2012 1:52 PM

## 2012-10-12 NOTE — Progress Notes (Signed)
CRITICAL VALUE ALERT  Critical value received:  Troponin I: 3.99  Date of notification:  10/12/2012  Time of notification:  10:23 AM   Critical value read back:yes  Nurse who received alert:  Nicanor Alcon   MD notified (1st page):  [place]  Time of first page:  10:25 AM   MD notified (2nd page):  Time of second page:   Responding MD:  Shirlee Latch  Time MD responded:  10:26 AM

## 2012-10-12 NOTE — Progress Notes (Signed)
Critical value Troponin I received, MD notified. No new orders received. Will continue to monitor.

## 2012-10-13 ENCOUNTER — Encounter (HOSPITAL_COMMUNITY): Payer: Self-pay | Admitting: Physician Assistant

## 2012-10-13 DIAGNOSIS — I2510 Atherosclerotic heart disease of native coronary artery without angina pectoris: Secondary | ICD-10-CM

## 2012-10-13 DIAGNOSIS — I214 Non-ST elevation (NSTEMI) myocardial infarction: Secondary | ICD-10-CM

## 2012-10-13 LAB — CBC
HCT: 39.9 % (ref 39.0–52.0)
MCH: 32.9 pg (ref 26.0–34.0)
MCV: 94.5 fL (ref 78.0–100.0)
RBC: 4.22 MIL/uL (ref 4.22–5.81)
WBC: 12.7 10*3/uL — ABNORMAL HIGH (ref 4.0–10.5)

## 2012-10-13 LAB — BASIC METABOLIC PANEL
CO2: 22 mEq/L (ref 19–32)
Calcium: 9.3 mg/dL (ref 8.4–10.5)
Chloride: 106 mEq/L (ref 96–112)
Glucose, Bld: 115 mg/dL — ABNORMAL HIGH (ref 70–99)
Sodium: 140 mEq/L (ref 135–145)

## 2012-10-13 MED ORDER — LOSARTAN POTASSIUM 25 MG PO TABS: 25.0000 mg | ORAL_TABLET | Freq: Every day | ORAL | Status: DC

## 2012-10-13 MED ORDER — LOSARTAN POTASSIUM 50 MG PO TABS
50.0000 mg | ORAL_TABLET | Freq: Every day | ORAL | Status: DC
  Filled 2012-10-13: qty 1

## 2012-10-13 MED ORDER — NITROGLYCERIN 0.4 MG SL SUBL: 0.4000 mg | SUBLINGUAL_TABLET | SUBLINGUAL | Status: DC | PRN

## 2012-10-13 MED ORDER — LOSARTAN POTASSIUM 25 MG PO TABS
25.0000 mg | ORAL_TABLET | Freq: Every day | ORAL | Status: DC
  Administered 2012-10-13: 25 mg via ORAL
  Filled 2012-10-13: qty 1

## 2012-10-13 MED ORDER — METOPROLOL TARTRATE 12.5 MG HALF TABLET
12.5000 mg | ORAL_TABLET | Freq: Two times a day (BID) | ORAL | Status: DC
  Administered 2012-10-13: 12.5 mg via ORAL
  Filled 2012-10-13 (×2): qty 1

## 2012-10-13 MED ORDER — METOPROLOL TARTRATE 25 MG PO TABS: 12.5000 mg | ORAL_TABLET | Freq: Two times a day (BID) | ORAL | Status: DC

## 2012-10-13 MED ORDER — ATORVASTATIN CALCIUM 80 MG PO TABS: 80.0000 mg | ORAL_TABLET | Freq: Every day | ORAL | Status: DC

## 2012-10-13 MED ORDER — TICAGRELOR 90 MG PO TABS: 90.0000 mg | ORAL_TABLET | Freq: Two times a day (BID) | ORAL | Status: DC

## 2012-10-13 MED ORDER — ASPIRIN 81 MG PO TABS: 81.0000 mg | ORAL_TABLET | Freq: Every day | ORAL | Status: DC

## 2012-10-13 NOTE — Progress Notes (Signed)
Subjective:  The patient is s/p PCI to LAD and DES to LCx.  The patient notes no chest pain overnight.  He continues to complain of cough.  Objective:  Vital Signs in the last 24 hours: Temp:  [97.9 F (36.6 C)-98.8 F (37.1 C)] 97.9 F (36.6 C) (05/17 0447) Pulse Rate:  [53-66] 53 (05/17 0447) Resp:  [16-19] 19 (05/17 0447) BP: (104-132)/(59-65) 109/63 mmHg (05/17 0447) SpO2:  [96 %-98 %] 97 % (05/17 0447)  Intake/Output from previous day: 05/16 0701 - 05/17 0700 In: 840 [P.O.:840] Out: 1100 [Urine:1100]  Physical Exam: General: alert, cooperative, and in no apparent distress HEENT: normal Neck: supple Lungs: clear to ascultation bilaterally Heart: RRR Abdomen: soft, non-tender, non-distended Extremities: no edema Neurologic: grossly intact  Lab Results:  Recent Labs  10/12/12 0352 10/13/12 0355  WBC 12.3* 12.7*  HGB 14.0 13.9  PLT 167 172    Recent Labs  10/12/12 0352 10/13/12 0355  NA 139 140  K 3.7 3.7  CL 105 106  CO2 22 22  GLUCOSE 99 115*  BUN 12 14  CREATININE 0.83 0.93    Recent Labs  Nov 01, 2012 1504 10/12/12 0841  TROPONINI <0.30 3.99*    Cardiac Studies: Cardiac Catheterization 01-Nov-2012 Hemodynamics:  AO 99/60  LV 99/20  Coronary angiography:  Coronary dominance: right  Left mainstem: Patent with mild to moderate calcification. There is a 30% distal left main stenosis.  Left anterior descending (LAD): The vessel is heavily calcified. The proximal LAD has diffuse calcific 50% stenosis. The stented segment has severe 95% eccentric stenosis with the appearance of a filling defect. The findings are suggestive of thrombus. The mid and distal LAD have mild segmental disease but no areas of high-grade stenosis.  Left circumflex (LCx): The left circumflex is a large caliber vessel. There is an intermediate branch with 60% ostial stenosis. The AV circumflex gives off a single large OM. The mid circumflex has 80% stenosis. The OM divides into  twin vessels without significant stenoses.  Right coronary artery (RCA): Large, dominant vessel. The mid vessel has 40-50% stenosis. There are no areas of high-grade disease noted. The PDA and PLA branches are widely patent.  Left ventriculography: Left ventricular systolic function is normal, LVEF is estimated at 55%, there is no significant mitral regurgitation  Intravascular ultrasound findings: The stented segment of the LAD is patent. The intravascular ultrasound was performed after predilatation. There was residual circumferential neointimal proliferation through the proximal portion of the stent. There was some irregularity but no clear evidence of thrombus. Proximal to the LAD stent, there is heavy circumflex frontal calcification with at least moderate stenosis. The left main has nonobstructive stenosis distally and is widely patent through the proximal and mid vessel.  PCI Note: Following the diagnostic procedure, the decision was made to proceed with PCI. The patient was preloaded with brilinta 180 mg. We confirmed that he had no history of major bleeding problems and no plans for upcoming surgeries. He's been compliant with his medical therapies. Weight-based bivalirudin was given for anticoagulation. Once a therapeutic ACT was achieved, a 6 [place] XB LAD 3.5 cm guide catheter was inserted. I plan on treating both the severe stenosis in the proximal LAD as well as the mid left circumflex. I plan on using intravascular ultrasound to help guide treatment for the severe stenosis in the stented segment of the LAD. A pro-water coronary guidewire was used to cross the lesion. The lesion was predilated with a 3.0 x 12 mm  noncompliant balloon. The segment was then evaluated with intravascular ultrasound using a mechanical pullback after administration of intracoronary nitroglycerin. This demonstrated wide patency of the distal portion of the stented segment. There was a good transition zone distally. The mid  and proximal segment of the stent had evidence of severe in-stent restenosis with neointimal proliferation. The stented segment was then redilated with a 3.0 x 10 mm cutting balloon to high pressure. Final angiography demonstrated an excellent result with less than 20% residual stenosis. Because there were already overlapping layers of stent, I elected to treat this with angioplasty alone. Attention was then turned to the left circumflex. The vessel was wired with a moderate amount of difficulty using the same pro-water wire. The lesion was dilated with the 3.0 [place] sprinter balloon to 8 atmospheres. The lesion was then stented with a 3.0 x 20 mm Promus premier drug-eluting stent. The stent was very difficult to deliver but eventually I was able to advance it across the lesion. The stented segment was postdilated with the same 3.0 [place] sprinter balloon to 18 atmospheres. Final angiography confirmed an excellent result. The patient tolerated the procedure well. There were no immediate procedural complications. A TR band was used for radial hemostasis. The patient was transferred to the post catheterization recovery area for further monitoring.  PCI Data:  Lesion 1:  Vessel - LAD/Segment - proximal  Percent Stenosis (pre) 95  TIMI-flow 3  Stent none  Percent Stenosis (post) 20  TIMI-flow (post) 3  Lesion 2:  Vessel - left circumflex/Segment - mid  Percent Stenosis (pre) 80  TIMI-flow 3  Stent 3.0 x 20 mm Promus premier drug-eluting  Percent Stenosis (post) 0  TIMI-flow (post) 3  Final Conclusions:  1. Severe stenosis of the mid LAD involving the previously stented segment with successful PCI using balloon angioplasty alone with guidance of intravascular ultrasound  2. Severe stenosis of the mid left circumflex treated successfully with a drug-eluting stent platform  3. Mild nonobstructive right coronary artery stenosis  4. Preserved left ventricular systolic function with an estimated left ventricular  ejection fraction of 55%  Recommendations:  Dual antiplatelet therapy with aspirin and brilinta for at least 12 months. 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 Would be reasonable to change back to Plavix after 12 months  Echocardiogram 10/11/12 - Left ventricle: The cavity size was normal. Wall thickness was normal. The estimated ejection fraction was 60%. Wall motion was normal; there were no regional wall motion abnormalities. Left ventricular diastolic function parameters were normal. - Aortic valve: Sclerosis without stenosis.  Tele: Sinus bradycardia, with few PVC's  Assessment/Plan:  The patient is a 62 yo man, history of CAD (s/p PCI to LAD 05, DES to LAD 06), HTN, presenting with chest pain.  # Acute obstructive CAD - the patient presented with symptoms of typical chest pain, with no elevation in troponin, found to have obstructive CAD, s/p stent with PCI to LAD and DES to LCx.  Immediately following the procedure, the patient experienced an episode of chest pain, pallor, lightheadedness, which resolved with IVF.  He has ruled in for non-ST elevation myocardial infarction. I discussed with Dr. Excell Seltzer this morning. This is felt most likely related to distal embolization of thrombus during procedure. He has had no further chest pain. Plan discharge today. -continue aspirin, brilinta -resume metoprolol 12.5 mg by mouth twice a day. -continue atorvastatin -discontinue ramipril because of cough. Add Cozaar 25 mg by mouth daily.   #  HTN - change Altace to Cozaar as described above.   Olga Millers 10/13/2012 9:44 AM

## 2012-10-13 NOTE — Discharge Summary (Signed)
Discharge Summary   Patient ID: Hayden Rivera MRN: [id number], DOB/AGE: [birth year] 63 y.o. Admit date: 10/10/2012 D/C date:     10/13/2012  Primary Cardiologist: Shirlee Latch this admission  Primary Discharge Diagnoses:  1. CAD  - THIS ADMISSION: s/p balloon angioplasty to mid LAD for severe stenosis involving previously stented segment and DES to LCx; initial enzymes negative but ruled in for NSTEMI following vagal symptoms after his case, felt likely related to distal embolization of thrombus during procedure - prior history: Anterior STEMI 2005 c/b vfib arrest, had PCI to LAD at [facility]; 2006: patient had stent thrombosis and had DES within prior LAD stent at [facility] 2. Prior h/o ischemic cardiomyopathy, EF 55% by cath this admission 3. HTN 4. Elevated R hemidiaphragm, instructed to f/u PCP in setting of #5 5. Cough - ACEI discontinued and changed to ARB 6. Sinus bradycardia  Secondary Discharge Diagnoses:  1. H/o VF arrest 2005 in setting of STEMI  Hospital Course: Hayden Rivera is a 62 y/o M with history of CAD s/p LAD PCI in 2005 (STEMI c/b VF arrest) and 2006, HTN who presented to [facility] with complaints of chest pain concerning for unstable angina. About a month ago, he had a brief episode of chest pain (does not remember the circumstances). 1 week ago, he stopped aspirin and Plavix for a skin surgery that he had today (mole removal). After surgery, he was at home washing his car and developed substernal chest pain that radiated up to his jaw. He presented initially to [facility] ER, started on heparin gtt, then was transferred to [facility]. Troponin was negative, EKG showed NSR old anteroseptal MI. He was treated with continued heparin gtt, ASA, statin, NTG paste, and BB. Metoprolol was adjusted given bradycardia with HR 40s-50s. 2D Echo showed EF 60% without RWMA. He underwent cardiac cath demonstrating 1. Severe stenosis of the mid LAD involving the previously stented segment  with successful PCI using balloon angioplasty alone with guidance of intravascular ultrasound  2. Severe stenosis of the mid left circumflex treated successfully with a drug-eluting stent platform  3. Mild nonobstructive right coronary artery stenosis  4. Preserved left ventricular systolic function with an estimated left ventricular ejection fraction of 55%  Dr. Excell Seltzer recommended ASA/brilinta for at least 12 months, with consideration for long-term antiplatelet therapy because of history of stent thrombosis. He felt it would be reasonable to change back to Plavix after 12 months. Post PCI, the patient had a prolonged vagal episode. He did not require atropine, but was treated with IV fluids. EKG was without acute ST-changes. Followup troponins were positive at 3.99 ruling him in for NSTEMI, likely related to distal embolization of thrombus during procedure. The patient remained stable and is doing well today. His ramipril was changed to Cozaar due to cough. He was also noted to have elevated R hemidiaphragm and thus was instructed to f/u PCP to determine if further eval is needed particularly in setting of cough. I personally discussed these findings with the patient. Dr. Jens Som has seen and examined him today and feels he is stable for discharge. I have left a message on our office's scheduling voicemail requesting a follow-up appointment, and our office will call the patient with this appointment.   Discharge Vitals: Blood pressure 136/70, pulse 61, temperature 98.6 F (37 C), temperature source Oral, resp. rate 18, height 5\' 11"  (1.803 m), weight 250 lb 10.6 oz (113.7 kg), SpO2 97.00%.  Labs: Lab Results  Component Value Date  WBC 12.7* 10/13/2012   HGB 13.9 10/13/2012   HCT 39.9 10/13/2012   MCV 94.5 10/13/2012   PLT 172 10/13/2012     Recent Labs Lab 10/13/12 0355  NA 140  K 3.7  CL 106  CO2 22  BUN 14  CREATININE 0.93  CALCIUM 9.3  GLUCOSE 115*    Recent Labs  10/10/12 2358  10/11/12 0545 10/11/12 1504 10/12/12 0352 10/12/12 0841  CKTOTAL  --   --  187 347* 389*  CKMB  --   --  3.8 20.1* 21.8*  TROPONINI <0.30 <0.30 <0.30  --  3.99*   Lab Results  Component Value Date   CHOL 129 10/11/2012   HDL 42 10/11/2012   LDLCALC 55 10/11/2012   TRIG 160* 10/11/2012    Diagnostic Studies/Procedures   Cardiac catheterization this admission, please see full report and above for summary.  2D Echo 10/11/12 - Left ventricle: The cavity size was normal. Wall thickness was normal. The estimated ejection fraction was 60%. Wall motion was normal; there were no regional wall motion abnormalities. Left ventricular diastolic function parameters were normal. - Aortic valve: Sclerosis without stenosis.  Dg Chest Port 1 View 10/12/2012   *RADIOLOGY REPORT*  Clinical Data: Cough.  Post cardiac catheterization 1 day ago.  PORTABLE CHEST - 1 VIEW  Comparison: None.  Findings: Elevated right hemidiaphragm limits evaluation of the right lung base.  Otherwise no evidence of infiltrate noted.  Mild central pulmonary vascular prominence.  No gross pneumothorax.  Heart size within normal limits.  The patient would eventually benefit from follow-up two-view chest with cardiac leads removed.  IMPRESSION: No infiltrate detected.  Please see above.   Original Report Authenticated By: Lacy Duverney, M.D.    Discharge Medications     Medication List    STOP taking these medications       clopidogrel 75 MG tablet  Commonly known as:  PLAVIX     hydrochlorothiazide 25 MG tablet  Commonly known as:  HYDRODIURIL     potassium chloride 10 MEQ tablet  Commonly known as:  K-DUR,KLOR-CON     ramipril 10 MG capsule  Commonly known as:  ALTACE     rosuvastatin 10 MG tablet  Commonly known as:  CRESTOR      TAKE these medications       aspirin 81 MG tablet  Take 1 tablet (81 mg total) by mouth daily.     atorvastatin 80 MG tablet  Commonly known as:  LIPITOR  Take 1 tablet (80 mg  total) by mouth daily.     cholecalciferol 1000 UNITS tablet  Commonly known as:  VITAMIN D  Take 1,000 Units by mouth daily.     Fish Oil 1200 MG Caps  Take 1 capsule by mouth 2 (two) times daily.     losartan 25 MG tablet  Commonly known as:  COZAAR  Take 1 tablet (25 mg total) by mouth daily.     metoprolol tartrate 25 MG tablet  Commonly known as:  LOPRESSOR  Take 0.5 tablets (12.5 mg total) by mouth 2 (two) times daily.     nitroGLYCERIN 0.4 MG SL tablet  Commonly known as:  NITROSTAT  Place 1 tablet (0.4 mg total) under the tongue every 5 (five) minutes as needed for chest pain (up to 3 doses).     OSTEO BI-FLEX TRIPLE STRENGTH PO  Take 1 tablet by mouth 2 (two) times daily.     Ticagrelor 90 MG Tabs tablet  Commonly  known as:  BRILINTA  Take 1 tablet (90 mg total) by mouth 2 (two) times daily.       Note: pharmacy inadvertantly entered brilinta as a home med since patient's wife had told them it was recently changed (not realizing she meant here in the hospital) - this shows up as continue rather than start, but the patient is aware to be taking brilinta.   Disposition   The patient will be discharged in stable condition to home. Discharge Orders   Future Orders Complete By Expires     Amb Referral to Cardiac Rehabilitation  As directed     Diet - low sodium heart healthy  As directed     Discharge instructions  As directed     Comments:      Please follow up with your primary doctor regarding your cough. On your chest x-ray, your hemidiaphragm (breathing muscle) was slightly more elevated on the right. This may be a normal finding but please discuss further evaluation with your doctor.    Increase activity slowly  As directed     Scheduling Instructions:      No driving for 1 week. No lifting over 10 lbs for 2 weeks. No sexual activity for 2 weeks. Keep procedure site clean & dry. If you notice increased pain, swelling, bleeding or pus, call/return!  You may  shower, but no soaking baths/hot tubs/pools for 1 week.      Follow-up Information   Follow up with Primary Care Doctor. (See discharge instructions)       Follow up with [name], MD. (Our office will call you for a follow-up appointment. Please call the office if you have not heard from [place] within 3 days.)    Contact information:   [address] [place] [address] [phone]        Duration of Discharge Encounter: Greater than 30 minutes including physician and PA time.  Signed, Ronie Spies PA-C 10/13/2012, 2:28 PM

## 2012-10-13 NOTE — Discharge Summary (Signed)
See progress notes Hayden Rivera  

## 2012-10-14 ENCOUNTER — Other Ambulatory Visit: Payer: Self-pay | Admitting: Physician Assistant

## 2012-10-14 ENCOUNTER — Telehealth: Payer: Self-pay | Admitting: Physician Assistant

## 2012-10-14 NOTE — Telephone Encounter (Addendum)
Pt's wife called Sunday 12:25pm with question about Brilinta. He was just discharged yesterday afternoon around 2:30pm with new rx for this. By the time they went to the pharmacy last night to pick it up, it was closed. They did not call our office at that time. This afternoon the patient's wife went to the pharmacy to pick up the prescription, but was told they do not have Brilinta in stock. It will have to be ordered. She was instructed to call various pharmacies in the area ASAP to try and locate a supply - I instructed her to try [facility] area first since [place] is right there and many of our heart patients are on Brilinta out that way. I told her the name-brand pharmacies may be able to run a search on the medication. I also told her this should be done ASAP as he is not supposed to miss any doses of Brilinta whatsoever given recent procedure. She will call me back as soon as one has been located. Since she works at [employer], she prefers to pick up the full prescription at [facility] (where it was originally e-scribed to) because her insurance will pay for it. Thus my plan is to send in additional short-term rx to whichever pharmacy she finds stocking Brilinta, until she can pick up regular prescription from [facility]. [facility] told her they will probably have it in stock by tomorrow. Pt verbalized understanding of all aspects of conversation and will call us back. Dayna Dunn PA-C  ADDENDUM: within 10 mins, pt's wife located med at [facility] [place]. Per pharmacist, they unable to fill short term rx, must be 60 tablets total. I verbally gave order for Brilinta 90mg  po bid disp #60 with zero rf to pharmacist there. Pt's wife aware that this has to be 30-day rx at minimum. I reiterated need for him to not miss any further doses due to risk of stent thrombosis. She verbalized understanding and gratitude. She will go pick up immediately. She will still plan to get regular refills at [facility]. Dayna Dunn PA-C

## 2012-10-31 ENCOUNTER — Ambulatory Visit (INDEPENDENT_AMBULATORY_CARE_PROVIDER_SITE_OTHER): Payer: Medicare Other | Admitting: Nurse Practitioner

## 2012-10-31 ENCOUNTER — Encounter: Payer: Self-pay | Admitting: Nurse Practitioner

## 2012-10-31 ENCOUNTER — Ambulatory Visit
Admission: RE | Admit: 2012-10-31 | Discharge: 2012-10-31 | Disposition: A | Discharge status: Home / Self Care | Payer: Medicare Other | Source: Ambulatory Visit | Attending: Nurse Practitioner | Admitting: Nurse Practitioner

## 2012-10-31 VITALS — BP 100/70 | HR 47 | Ht 71.0 in | Wt 247.1 lb

## 2012-10-31 DIAGNOSIS — I214 Non-ST elevation (NSTEMI) myocardial infarction: Secondary | ICD-10-CM

## 2012-10-31 DIAGNOSIS — R0600 Dyspnea, unspecified: Secondary | ICD-10-CM

## 2012-10-31 DIAGNOSIS — R0609 Other forms of dyspnea: Secondary | ICD-10-CM

## 2012-10-31 LAB — BASIC METABOLIC PANEL
BUN: 18 mg/dL (ref 6–23)
CO2: 22 mEq/L (ref 19–32)
Calcium: 9.4 mg/dL (ref 8.4–10.5)
Chloride: 109 mEq/L (ref 96–112)
Creatinine, Ser: 0.9 mg/dL (ref 0.4–1.5)
GFR: 87.34 mL/min (ref >60.00)
Glucose, Bld: 86 mg/dL (ref 70–99)
Potassium: 4.3 mEq/L (ref 3.5–5.1)
Sodium: 143 mEq/L (ref 135–145)

## 2012-10-31 LAB — CBC WITH DIFFERENTIAL/PLATELET
Basophils Absolute: 0.1 10*3/uL (ref 0.0–0.1)
Basophils Relative: 0.6 % (ref 0.0–3.0)
Eosinophils Absolute: 0.2 10*3/uL (ref 0.0–0.7)
Eosinophils Relative: 2.4 % (ref 0.0–5.0)
HCT: 43.9 % (ref 39.0–52.0)
Hemoglobin: 14.5 g/dL (ref 13.0–17.0)
Lymphocytes Relative: 32 % (ref 12.0–46.0)
Lymphs Abs: 2.9 10*3/uL (ref 0.7–4.0)
MCHC: 33 g/dL (ref 30.0–36.0)
MCV: 98.6 fl (ref 78.0–100.0)
Monocytes Absolute: 0.8 10*3/uL (ref 0.1–1.0)
Monocytes Relative: 9.2 % (ref 3.0–12.0)
Neutro Abs: 5.1 10*3/uL (ref 1.4–7.7)
Neutrophils Relative %: 55.8 % (ref 43.0–77.0)
Platelets: 184 10*3/uL (ref 150.0–400.0)
RBC: 4.45 Mil/uL (ref 4.22–5.81)
RDW: 12.8 % (ref 11.5–14.6)
WBC: 9.2 10*3/uL (ref 4.5–10.5)

## 2012-10-31 LAB — BRAIN NATRIURETIC PEPTIDE: Pro B Natriuretic peptide (BNP): 44 pg/mL (ref 0.0–100.0)

## 2012-10-31 MED ORDER — PRASUGREL HCL 10 MG PO TABS: 10.0000 mg | ORAL_TABLET | Freq: Every day | ORAL | Status: DC

## 2012-10-31 NOTE — Patient Instructions (Addendum)
You will not be switched to Plavix for at least a year - that was Dr. Earmon Phoenix recommendation.  We will stop the Brilinta and place you on Effient 10 mg a day. This may help with your breathing. We will try to give you samples.  Stop the metoprolol - to let your heart rate and BP come up.  I think your shortness of breath is coming from your elevated right hemidiaphragm and your medicines  You need a CXR today to look at that. May have to go see a lung doctor as well since you are continuing to cough. Go to [facility] to [facility] on the first floor - [address]  Your pumping function of your heart is normal.   We need to see you in 2 weeks with a repeat EKG  We will check labs today.   Call the [facility] office at [phone] if you have any questions, problems or concerns.

## 2012-10-31 NOTE — Progress Notes (Signed)
Hayden Rivera Date of Birth: 1951-05-28 Medical Record #[id number]  History of Present Illness: Hayden Rivera is seen back today for a post hospital visit. Seen for Hayden Rivera. He has a history of CAD withprior LAD in 2005 (STEMIc/b VF arrest) and in 2006. Other issues include HTN, elevated right hemidiaphragm, cough (changed to ARB from ACE) and chronic sinus bradycardia. Has had past ischemic CM but EF is 55% by recent cath and 60% per echo done at [facility].  Most recently admitted with chest pain. Had stopped his aspirin and Plavix one week prior to admission for a skin surgery. Was admitted and ruled out initially for MI. Had cath with balloon PCI to the mid LAD for severe stenosis involving the previously stented segment and DES to the LCX. Ruled in for NSTEMI following vagal symptoms post cath, felt likely related to distal embolization of thrombus during the procedure. He is on aspirin and Brilinta for one year with consideration for long term therapy. Could change to Plavix after a year of therapy. Noted to have an elevated right hemidiaphragm and was to see his PCP.   Comes in today. He is here with his wife. He is "a whole lot better" but only about 80%. His cough persists. No change with switching to ARB therapy. Short of breath. Wakes up at night and feels "anxious". Has to go and sit up. No chest pain. No jaw pain. Little lightheaded at times. BP and HR still low. He does not have a PCP.    Current Outpatient Prescriptions on File Prior to Visit  Medication Sig Dispense Refill  . aspirin 81 MG tablet Take 1 tablet (81 mg total) by mouth daily.      . cholecalciferol (VITAMIN D) 1000 UNITS tablet Take 1,000 Units by mouth daily.      Marland Kitchen losartan (COZAAR) 25 MG tablet Take 1 tablet (25 mg total) by mouth daily.  30 tablet  6  . metoprolol tartrate (LOPRESSOR) 25 MG tablet Take 0.5 tablets (12.5 mg total) by mouth 2 (two) times daily.  60 tablet  6  . Misc Natural Products (OSTEO BI-FLEX  TRIPLE STRENGTH PO) Take 1 tablet by mouth 2 (two) times daily.      . nitroGLYCERIN (NITROSTAT) 0.4 MG SL tablet Place 1 tablet (0.4 mg total) under the tongue every 5 (five) minutes as needed for chest pain (up to 3 doses).  25 tablet  4  . Omega-3 Fatty Acids (FISH OIL) 1200 MG CAPS Take 1 capsule by mouth 2 (two) times daily.      . Ticagrelor (BRILINTA) 90 MG TABS tablet Take 1 tablet (90 mg total) by mouth 2 (two) times daily.  60 tablet  10   No current facility-administered medications on file prior to visit.    Allergies  Allergen Reactions  . Altace (Ramipril)     cough  . Penicillins Swelling    Past Medical History  Diagnosis Date  . Arthritis   . CAD (coronary artery disease)     a. Anterior STEMI 2005 c/b vfib arrest, PCI to LAD at [facility]. b. Stent thrombosis 2006 with DES within prior LAD stent at [facility]. C. 09/2012: s/p balloon angioplasty to mLAD for severe stenosis in previously stented segment & DES to LCx; initial enz neg but ruled in for NSTEMI after post-cath vagal sx, felt d/t distal emboliz of thrombus during case.  . Ischemic cardiomyopathy     a. Unclear prior EF but pt was told heart was  weakened in past. b. EF normal 09/2012.  Marland Kitchen Ventricular fibrillation     a. VF arrest 2005 in setting of STEMI.  . Elevated hemidiaphragm     a. Noted 09/2012 - instructed to f/u PCP.    Past Surgical History  Procedure Laterality Date  . Back surgery    . Hand surgery    . Cardiac catheterization      stents x2    History  Smoking status  . Former Smoker  . Types: Cigars  Smokeless tobacco  . Not on file    History  Alcohol Use No    History reviewed. No pertinent family history.  Review of Systems: The review of systems is per the HPI.  All other systems were reviewed and are negative.  Physical Exam: BP 100/70  Pulse 47  Ht 5\' 11"  (1.803 m)  Wt 247 lb 1.9 oz (112.093 kg)  BMI 34.48 kg/m2  SpO2 100% Patient is very pleasant and in no acute distress.  Skin is warm and dry. Color is normal.  HEENT is unremarkable. Normocephalic/atraumatic. PERRL. Sclera are nonicteric. Neck is supple. No masses. No JVD. Lungs are clear. Cardiac exam shows a regular rate and rhythm. Abdomen is soft. Extremities are without edema. Gait and ROM are intact. No gross neurologic deficits noted.  LABORATORY DATA: EKG today shows marked sinus bradycardia with PACs.   Lab Results  Component Value Date   WBC 12.7* 10/13/2012   HGB 13.9 10/13/2012   HCT 39.9 10/13/2012   PLT 172 10/13/2012   GLUCOSE 115* 10/13/2012   CHOL 129 10/11/2012   TRIG 160* 10/11/2012   HDL 42 10/11/2012   LDLCALC 55 10/11/2012   NA 140 10/13/2012   K 3.7 10/13/2012   CL 106 10/13/2012   CREATININE 0.93 10/13/2012   BUN 14 10/13/2012   CO2 22 10/13/2012   TSH 2.083 10/10/2012   INR 1.15 10/11/2012   Dg Chest Port 1 View  10/12/2012   *RADIOLOGY REPORT*  Clinical Data: Cough.  Post cardiac catheterization 1 day ago.  PORTABLE CHEST - 1 VIEW  Comparison: None.  Findings: Elevated right hemidiaphragm limits evaluation of the right lung base.  Otherwise no evidence of infiltrate noted.  Mild central pulmonary vascular prominence.  No gross pneumothorax.  Heart size within normal limits.  The patient would eventually benefit from follow-up two-view chest with cardiac leads removed.  IMPRESSION: No infiltrate detected.  Please see above.   Original Report Authenticated By: Lacy Duverney, M.D.    Coronary angiography:   Left mainstem: Patent with mild to moderate calcification. There is a 30% distal left main stenosis.  Left anterior descending (LAD): The vessel is heavily calcified. The proximal LAD has diffuse calcific 50% stenosis. The stented segment has severe 95% eccentric stenosis with the appearance of a filling defect. The findings are suggestive of thrombus. The mid and distal LAD have mild segmental disease but no areas of high-grade stenosis.  Left circumflex (LCx): The left circumflex is a large  caliber vessel. There is an intermediate branch with 60% ostial stenosis. The AV circumflex gives off a single large OM. The mid circumflex has 80% stenosis. The OM divides into twin vessels without significant stenoses.  Right coronary artery (RCA): Large, dominant vessel. The mid vessel has 40-50% stenosis. There are no areas of high-grade disease noted. The PDA and PLA branches are widely patent.  Left ventriculography: Left ventricular systolic function is normal, LVEF is estimated at 55%, there is no significant mitral  regurgitation  Intravascular ultrasound findings: The stented segment of the LAD is patent. The intravascular ultrasound was performed after predilatation. There was residual circumferential neointimal proliferation through the proximal portion of the stent. There was some irregularity but no clear evidence of thrombus. Proximal to the LAD stent, there is heavy circumflex frontal calcification with at least moderate stenosis. The left main has nonobstructive stenosis distally and is widely patent through the proximal and mid vessel.  PCI Note: Following the diagnostic procedure, the decision was made to proceed with PCI. The patient was preloaded with brilinta 180 mg. We confirmed that he had no history of major bleeding problems and no plans for upcoming surgeries. He's been compliant with his medical therapies. Weight-based bivalirudin was given for anticoagulation. Once a therapeutic ACT was achieved, a 6 [place] XB LAD 3.5 cm guide catheter was inserted. I plan on treating both the severe stenosis in the proximal LAD as well as the mid left circumflex. I plan on using intravascular ultrasound to help guide treatment for the severe stenosis in the stented segment of the LAD. A pro-water coronary guidewire was used to cross the lesion. The lesion was predilated with a 3.0 x 12 mm noncompliant balloon. The segment was then evaluated with intravascular ultrasound using a mechanical pullback  after administration of intracoronary nitroglycerin. This demonstrated wide patency of the distal portion of the stented segment. There was a good transition zone distally. The mid and proximal segment of the stent had evidence of severe in-stent restenosis with neointimal proliferation. The stented segment was then redilated with a 3.0 x 10 mm cutting balloon to high pressure. Final angiography demonstrated an excellent result with less than 20% residual stenosis. Because there were already overlapping layers of stent, I elected to treat this with angioplasty alone. Attention was then turned to the left circumflex. The vessel was wired with a moderate amount of difficulty using the same pro-water wire. The lesion was dilated with the 3.0 [place] sprinter balloon to 8 atmospheres. The lesion was then stented with a 3.0 x 20 mm Promus premier drug-eluting stent. The stent was very difficult to deliver but eventually I was able to advance it across the lesion. The stented segment was postdilated with the same 3.0 [place] sprinter balloon to 18 atmospheres. Final angiography confirmed an excellent result. The patient tolerated the procedure well. There were no immediate procedural complications. A TR band was used for radial hemostasis. The patient was transferred to the post catheterization recovery area for further monitoring.  PCI Data:  Lesion 1:  Vessel - LAD/Segment - proximal  Percent Stenosis (pre) 95  TIMI-flow 3  Stent none  Percent Stenosis (post) 20  TIMI-flow (post) 3  Lesion 2:  Vessel - left circumflex/Segment - mid  Percent Stenosis (pre) 80  TIMI-flow 3  Stent 3.0 x 20 mm Promus premier drug-eluting  Percent Stenosis (post) 0  TIMI-flow (post) 3   Final Conclusions:  1. Severe stenosis of the mid LAD involving the previously stented segment with successful PCI using balloon angioplasty alone with guidance of intravascular ultrasound  2. Severe stenosis of the mid left circumflex treated  successfully with a drug-eluting stent platform  3. Mild nonobstructive right coronary artery stenosis  4. Preserved left ventricular systolic function with an estimated left ventricular ejection fraction of 55%   Recommendations:  Dual antiplatelet therapy with aspirin and brilinta for at least 12 months. 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 Would be reasonable to change  back to Plavix after 12 months  Tonny Bollman  10/11/2012, 12:25 PM   Assessment / Plan: 1. CAD with recent cath/PCI as described above. No further chest/jaw pain but is short of breath. EF is normal.   2. Dyspnea - possibly related to his medicines, his elevated diaphragm. Will switch to Effient. Will recheck a CXR 2 view today. Will check labs today as well.  3. Bradycardia - Metoprolol is stopped. Hopefully this will let his BP and heart rate come up some.  4. Cough - does not sound like it was ACE related.   I will see him back in 2 weeks. Hold on cardiac rehab at this time. May need pulmonary referral at some point but will make these changes first and then reassess.   Patient is agreeable to this plan and will call if any problems develop in the interim.   Rosalio Macadamia, RN, ANP-C [facility] [address] [place], [place]  [address]

## 2012-10-31 NOTE — Addendum Note (Signed)
Addended by: Rosalio Macadamia on: 10/31/2012 09:37 AM   Modules accepted: Orders

## 2012-11-01 ENCOUNTER — Telehealth: Payer: Self-pay | Admitting: *Deleted

## 2012-11-01 NOTE — Telephone Encounter (Signed)
Message copied by Tarri Fuller on Thu Nov 01, 2012  9:55 AM ------      Message from: Rosalio Macadamia      Created: Wed Oct 31, 2012 10:29 AM       Ok to report. The chest XRAY is stable. No active lung issues. Slight elevation of the right hemidiaphragm noted. Will see how he does with the med changes that were made today. If his symptoms do not improve by the time he is seen back, will send to pulmonary. ------

## 2012-11-01 NOTE — Telephone Encounter (Signed)
pt notified about cxr and lab results with verbal understanding on both results. has f/u w/LG, NP 6/18 11:45.

## 2012-11-14 ENCOUNTER — Encounter: Payer: Self-pay | Admitting: Nurse Practitioner

## 2012-11-14 ENCOUNTER — Ambulatory Visit (INDEPENDENT_AMBULATORY_CARE_PROVIDER_SITE_OTHER): Payer: Medicare Other | Admitting: Nurse Practitioner

## 2012-11-14 VITALS — BP 132/80 | HR 60 | Ht 71.0 in | Wt 246.0 lb

## 2012-11-14 DIAGNOSIS — I259 Chronic ischemic heart disease, unspecified: Secondary | ICD-10-CM

## 2012-11-14 MED ORDER — ISOSORBIDE MONONITRATE ER 30 MG PO TB24: 30.0000 mg | ORAL_TABLET | Freq: Every day | ORAL | Status: DC

## 2012-11-14 NOTE — Progress Notes (Signed)
Hayden Rivera Date of Birth: 01-Mar-1951 Medical Record #[id number]  History of Present Illness: Hayden Rivera is seen back today for a 2 week check. Seen for Dr. Shirlee Latch. He has a history of CAD with prior LAD in 2005 (STEMI c/b VF arrest) and in 2006. Other issues include HTN, elevated right hemidiaphragm, cough (changed to ARB from ACE) and chronic sinus bradycardia. Has had past ischemic CM but EF is 55% by recent cath and 60% per echo done at [facility].   Most recently admitted in May with chest pain. Had stopped his aspirin and Plavix one week prior to admission for a skin surgery. Was admitted and ruled out initially for MI. Had cath with balloon PCI to the mid LAD for severe stenosis involving the previously stented segment and DES to the LCX. Ruled in for NSTEMI following vagal symptoms post cath, felt likely related to distal embolization of thrombus during the procedure. He was on aspirin and Brilinta for one year with consideration for long term therapy. Could change to Plavix after a year of therapy. Noted to have an elevated right hemidiaphragm and was to see his PCP.   I saw him 2 weeks. He was short of breath. Still with a cough. No chest pain. We checked a CXR. Changed him to Effient from [facility].   He comes back today. He is here with his wife. He is feeling better overall. No more shortness of breath or anxious feelings at night. Back playing golf and feels like he is back to his baseline. Did have some chest pain yesterday - lasted about 5 to 10 minutes - no NTG use - little similar to his prior chest pain syndrome. May have felt like indigestion. Recurred during the night. May have felt some in his jaw. None today so far. He is not really exercising but back playing golf and walking the course. Has been doing some heavy moving since they are relocating to [place].   Current Outpatient Prescriptions on File Prior to Visit  Medication Sig Dispense Refill  . aspirin 81 MG tablet  Take 1 tablet (81 mg total) by mouth daily.      . cholecalciferol (VITAMIN D) 1000 UNITS tablet Take 1,000 Units by mouth daily.      Marland Kitchen losartan (COZAAR) 25 MG tablet Take 1 tablet (25 mg total) by mouth daily.  30 tablet  6  . Misc Natural Products (OSTEO BI-FLEX TRIPLE STRENGTH PO) Take 1 tablet by mouth 2 (two) times daily.      . nitroGLYCERIN (NITROSTAT) 0.4 MG SL tablet Place 1 tablet (0.4 mg total) under the tongue every 5 (five) minutes as needed for chest pain (up to 3 doses).  25 tablet  4  . Omega-3 Fatty Acids (FISH OIL) 1200 MG CAPS Take 1 capsule by mouth 2 (two) times daily.      . prasugrel (EFFIENT) 10 MG TABS Take 1 tablet (10 mg total) by mouth daily.  30 tablet  11  . rosuvastatin (CRESTOR) 10 MG tablet Take 10 mg by mouth daily.       No current facility-administered medications on file prior to visit.    Allergies  Allergen Reactions  . Altace (Ramipril)     cough  . Penicillins Swelling    Past Medical History  Diagnosis Date  . Arthritis   . CAD (coronary artery disease)     a. Anterior STEMI 2005 c/b vfib arrest, PCI to LAD at [facility]. b. Stent thrombosis 2006 with  DES within prior LAD stent at [facility]. C. 09/2012: s/p balloon angioplasty to mLAD for severe stenosis in previously stented segment & DES to LCx; initial enz neg but ruled in for NSTEMI after post-cath vagal sx, felt d/t distal emboliz of thrombus during case.  . Ischemic cardiomyopathy     a. Unclear prior EF but pt was told heart was weakened in past. b. EF normal 09/2012.  Marland Kitchen Ventricular fibrillation     a. VF arrest 2005 in setting of STEMI.  . Elevated hemidiaphragm     a. Noted 09/2012 - instructed to f/u PCP.    Past Surgical History  Procedure Laterality Date  . Back surgery    . Hand surgery    . Cardiac catheterization      stents x2    History  Smoking status  . Former Smoker  . Types: Cigars  Smokeless tobacco  . Not on file    History  Alcohol Use No    History reviewed.  No pertinent family history.  Review of Systems: The review of systems is per the HPI.  All other systems were reviewed and are negative.  Physical Exam: BP 132/80  Pulse 60  Ht 5\' 11"  (1.803 m)  Wt 246 lb (111.585 kg)  BMI 34.33 kg/m2 Patient is very pleasant and in no acute distress. Skin is warm and dry. Color is normal.  HEENT is unremarkable. Normocephalic/atraumatic. PERRL. Sclera are nonicteric. Neck is supple. No masses. No JVD. Lungs are clear. Cardiac exam shows a regular rate and rhythm. He does have a soft outflow murmur. Abdomen is soft. Extremities are without edema. Gait and ROM are intact. No gross neurologic deficits noted.  LABORATORY DATA: EKG with sinus rhythm, PAC, anterior Q's - unchanged. QT may be a little longer versus slight U wave noted.   Dg Chest 2 View  10/31/2012   *RADIOLOGY REPORT*  Clinical Data: Cough, short of breath, former smoking history  CHEST - 2 VIEW  Comparison: Portable chest x-ray of 10/12/2012  Findings: No active infiltrate or effusion is seen.  Mediastinal contours appear stable.  The heart is within upper limits of normal.  Slight elevation of the right hemidiaphragm is stable.  No bony abnormality is seen.  IMPRESSION: Stable chest x-ray.  No active lung disease.   Original Report Authenticated By: Dwyane Dee, M.D.   Lab Results  Component Value Date   WBC 9.2 10/31/2012   HGB 14.5 10/31/2012   HCT 43.9 10/31/2012   PLT 184.0 10/31/2012   GLUCOSE 86 10/31/2012   CHOL 129 10/11/2012   TRIG 160* 10/11/2012   HDL 42 10/11/2012   LDLCALC 55 10/11/2012   NA 143 10/31/2012   K 4.3 10/31/2012   CL 109 10/31/2012   CREATININE 0.9 10/31/2012   BUN 18 10/31/2012   CO2 22 10/31/2012   TSH 2.083 10/10/2012   INR 1.15 10/11/2012   Cardiac Cath Final Conclusions:  1. Severe stenosis of the mid LAD involving the previously stented segment with successful PCI using balloon angioplasty alone with guidance of intravascular ultrasound  2. Severe stenosis of the mid left  circumflex treated successfully with a drug-eluting stent platform  3. Mild nonobstructive right coronary artery stenosis  4. Preserved left ventricular systolic function with an estimated left ventricular ejection fraction of 55%   Recommendations:  Dual antiplatelet therapy with aspirin and brilinta for at least 12 months. 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 Would be reasonable to change  back to Plavix after 12 months  Tonny Bollman  10/11/2012, 12:25 PM   Assessment / Plan: 1. CAD - with recent cath/PCI - some recurrent chest pain - may be GI - will add OTC Prilosec or Nexium to take one a day along with Imdur 30 mg daily. Use sl NTG prn. Will get him back to see Dr. Shirlee Latch in about 2 weeks. Overall he is doing much better but will need to continue to monitor closely. Recheck EKG on return.   2. Dyspnea - now resolved - probably from Brilinta.   3. Bradycardia - resolved - off of his beta blocker.   Patient is agreeable to this plan and will call if any problems develop in the interim.   Rosalio Macadamia, RN, ANP-C [facility] [address] [place], [place]  [address]

## 2012-11-14 NOTE — Patient Instructions (Addendum)
Go to the drugstore and pick up either OTC Prilosec or the Nexium and take one a day  I am sending the prescription for Imdur 30 mg to the drug store - take that one a day.   Stay on your current medicines.  Use your NTG under your tongue for recurrent chest pain. May take one tablet every 5 minutes. If you are still having discomfort after 3 tablets in 15 minutes, call 911.  See Dr. Shirlee Latch in 2 to 3 weeks  Call the [facility] office at [phone] if you have any questions, problems or concerns.

## 2012-11-29 ENCOUNTER — Ambulatory Visit (INDEPENDENT_AMBULATORY_CARE_PROVIDER_SITE_OTHER): Payer: Medicare Other | Admitting: Cardiology

## 2012-11-29 ENCOUNTER — Encounter: Payer: Self-pay | Admitting: Cardiology

## 2012-11-29 ENCOUNTER — Encounter: Payer: Self-pay | Admitting: *Deleted

## 2012-11-29 ENCOUNTER — Encounter (INDEPENDENT_AMBULATORY_CARE_PROVIDER_SITE_OTHER): Payer: Medicare Other

## 2012-11-29 VITALS — BP 142/82 | HR 76 | Ht 71.0 in | Wt 247.0 lb

## 2012-11-29 DIAGNOSIS — E785 Hyperlipidemia, unspecified: Secondary | ICD-10-CM

## 2012-11-29 DIAGNOSIS — J986 Disorders of diaphragm: Secondary | ICD-10-CM

## 2012-11-29 DIAGNOSIS — R002 Palpitations: Secondary | ICD-10-CM

## 2012-11-29 DIAGNOSIS — I214 Non-ST elevation (NSTEMI) myocardial infarction: Secondary | ICD-10-CM

## 2012-11-29 DIAGNOSIS — I2510 Atherosclerotic heart disease of native coronary artery without angina pectoris: Secondary | ICD-10-CM

## 2012-11-29 DIAGNOSIS — I10 Essential (primary) hypertension: Secondary | ICD-10-CM

## 2012-11-29 MED ORDER — LOSARTAN POTASSIUM 50 MG PO TABS: 50.0000 mg | ORAL_TABLET | Freq: Every day | ORAL | Status: DC

## 2012-11-29 NOTE — Patient Instructions (Addendum)
Increase losartan to 50mg  daily. You can take 2 of your 25mg  tablets daily at the same time and use your current supply.  Your physician recommends that you return for a FASTING lipid profile /liver profile/BMET in 2 weeks.    You have been referred to Dr Marchelle Gearing at [facility].  You have been referred to Cardiac Rehab at [facility].  Your physician has recommended that you wear a holter monitor. Holter monitors are medical devices that record the heart's electrical activity. Doctors most often use these monitors to diagnose arrhythmias. Arrhythmias are problems with the speed or rhythm of the heartbeat. The monitor is a small, portable device. You can wear one while you do your normal daily activities. This is usually used to diagnose what is causing palpitations/syncope (passing out). 24 hour    Your physician wants you to follow-up in: 3 months with Dr Shirlee Latch. (October 2014), You will receive a reminder letter in the mail two months in advance. If you don't receive a letter, please call our office to schedule the follow-up appointment.

## 2012-11-29 NOTE — Progress Notes (Signed)
Patient ID: Hayden Rivera, male   DOB: 01-19-1951, 62 y.o.   MRN: [id number] E-Cardio 24 Hour Holter monitor applied to patient.

## 2012-11-30 DIAGNOSIS — R002 Palpitations: Secondary | ICD-10-CM | POA: Insufficient documentation

## 2012-11-30 DIAGNOSIS — E785 Hyperlipidemia, unspecified: Secondary | ICD-10-CM | POA: Insufficient documentation

## 2012-11-30 NOTE — Progress Notes (Signed)
Patient ID: Hayden Rivera, male   DOB: Oct 17, 1950, 62 y.o.   MRN: [id number] 62 yo with history of CAD s/p recent admission with unstable angina presents for cardiology followup.  In 5/14, patient was admitted with unstable angina.  He had angioplasty to in-stent restenosis in the mid LAD and had DES to LCx.  The procedure was complicated by probable distal embolization with peri-procedural infarction.  EF was preserved on echo done this admission.  After discharge, he was seen in the office with significant dyspnea.  Brilinta was stopped and he was started on Effient.  The dyspnea resolved.  Metoprolol was stopped due to bradycardia.    Currently, Hayden Rivera is doing well.  He is playing golf and doing some walking for exercise.  He has been moving into a new house in [place], so has been very active.  He denies exertional dyspnea or chest pain.  He was, of note, found to have an elevated right hemidiaphragm during recent hospitalization.  He has also been having palpitations/fluttering in chest that has been bothersome.   Labs (6/14): K 4.3, creatinine 0.9, BNP 44  PMH: 1. CAD: Anterior STEMI in 2005 complicated by ventricular fibrillation arrest.  PCI to LAD at [facility].  Stent thrombosis in 2006, had DES to LAD in [place].  5/14 had balloon angioplasty to mid LAD (in-stent restenosis) and DES to LCx for unstable angina.  Echo (5/14) with EF 60%, no regional wall motion abnormalities, aortic sclerosis without stenosis.  2. Elevated right hemidiaphragm.  3. Hyperlipidemia. 4. Dyspnea with Brilinta.  5. HTN  SH: Retired, prior smoker, lives in [place].   FH: CAD  ROS: All systems reviewed and negative except as per HPI.   Current Outpatient Prescriptions  Medication Sig Dispense Refill  . aspirin 81 MG tablet Take 1 tablet (81 mg total) by mouth daily.      . cholecalciferol (VITAMIN D) 1000 UNITS tablet Take 1,000 Units by mouth daily.      . isosorbide mononitrate (IMDUR) 30 MG 24  hr tablet Take 1 tablet (30 mg total) by mouth daily.  30 tablet  3  . Misc Natural Products (OSTEO BI-FLEX TRIPLE STRENGTH PO) Take 1 tablet by mouth 2 (two) times daily.      . nitroGLYCERIN (NITROSTAT) 0.4 MG SL tablet Place 1 tablet (0.4 mg total) under the tongue every 5 (five) minutes as needed for chest pain (up to 3 doses).  25 tablet  4  . Omega-3 Fatty Acids (FISH OIL) 1200 MG CAPS Take 1 capsule by mouth 2 (two) times daily.      . prasugrel (EFFIENT) 10 MG TABS Take 1 tablet (10 mg total) by mouth daily.  30 tablet  11  . Ranitidine HCl (ZANTAC 75 PO) Take by mouth daily.      . rosuvastatin (CRESTOR) 10 MG tablet Take 10 mg by mouth daily.      Marland Kitchen losartan (COZAAR) 50 MG tablet Take 1 tablet (50 mg total) by mouth daily.  90 tablet  1   No current facility-administered medications for this visit.    BP 142/82  Pulse 76  Ht 5\' 11"  (1.803 m)  Wt 247 lb (112.038 kg)  BMI 34.46 kg/m2  SpO2 96% General: NAD, overweight Neck: No JVD, no thyromegaly or thyroid nodule.  Lungs: Slight decreased breath sounds right base.  CV: Nondisplaced PMI.  Heart regular S1/S2, no S3/S4, no murmur.  No peripheral edema.  No carotid bruit.  Normal pedal pulses.  Abdomen: Soft, nontender, no hepatosplenomegaly, no distention.  Skin: Intact without lesions or rashes.  Neurologic: Alert and oriented x 3.  Psych: Normal affect. Extremities: No clubbing or cyanosis.   Assessment/Plan: 1. CAD: Stable with no ischemic symptoms.  EF preserved on echo in 5/14.  Dyspnea resolved off Brilinta.   - Continue Effient until 5/15, will switch back to Plavix at that point for long-term therapy given history of stent thrombosis. - Continue ASA 81, Imdur, ARB, statin.  - No beta blocker with bradycardia while on low dose metoprolol. - Start cardiac rehab at [facility].  2. Palpitations: I will arrange a 24 hour holter monitor.  3. HTN: Increase losartan to 50 mg daily with BMET in 2 wks. 4. Hyperlipidemia:  Fasting lipids in 2 wks, continue statin.   [name] 11/30/2012

## 2012-12-18 ENCOUNTER — Encounter (HOSPITAL_COMMUNITY): Payer: Self-pay

## 2012-12-18 ENCOUNTER — Encounter (HOSPITAL_COMMUNITY)
Admission: RE | Admit: 2012-12-18 | Discharge: 2012-12-18 | Disposition: A | Discharge status: Home / Self Care | Payer: Medicare Other | Source: Ambulatory Visit | Attending: Cardiology | Admitting: Cardiology

## 2012-12-18 DIAGNOSIS — I252 Old myocardial infarction: Secondary | ICD-10-CM | POA: Insufficient documentation

## 2012-12-18 DIAGNOSIS — I219 Acute myocardial infarction, unspecified: Secondary | ICD-10-CM

## 2012-12-18 DIAGNOSIS — Z5189 Encounter for other specified aftercare: Secondary | ICD-10-CM | POA: Insufficient documentation

## 2012-12-18 DIAGNOSIS — Z9861 Coronary angioplasty status: Secondary | ICD-10-CM

## 2012-12-18 NOTE — Progress Notes (Signed)
Patient was referred to CR by Dr. [name] due to recent Myocardial Infarction 410.90. Patient had angioplasty performed. During orientation advised patient on arrival and appointment times what to wear, what to do before, during and after exercise. Reviewed attendance and class policy. Talked about inclement weather and class consultation policy. Pt is scheduled to start Cardiac Rehab on 12/24/12 at 8:15am. Pt was advised to come to class 5 minutes before class starts. He was also given instructions on meeting with the dietician and attending the Family Structure classes. Pt is eager to get started.

## 2012-12-18 NOTE — Patient Instructions (Signed)
Pt has finished orientation and is scheduled to start CR on 12/24/12 at 8:15am. Pt has been instructed to arrive to class 15 minutes early for scheduled class. Pt has been instructed to wear comfortable clothing and shoes with rubber soles. Pt has been told to take their medications 1 hour prior to coming to class.  If the patient is not going to attend class, he/she has been instructed to call.

## 2012-12-19 ENCOUNTER — Telehealth: Payer: Self-pay | Admitting: *Deleted

## 2012-12-19 NOTE — Telephone Encounter (Signed)
Pt given results of monitor done 11/29/12 and reviewed by Dr Shirlee Latch.

## 2012-12-24 ENCOUNTER — Encounter (HOSPITAL_COMMUNITY)
Admission: RE | Admit: 2012-12-24 | Discharge: 2012-12-24 | Disposition: A | Discharge status: Home / Self Care | Payer: Medicare Other | Source: Ambulatory Visit | Attending: Cardiology | Admitting: Cardiology

## 2012-12-26 ENCOUNTER — Institutional Professional Consult (permissible substitution): Payer: Medicare Other | Admitting: Internal Medicine

## 2012-12-26 ENCOUNTER — Encounter: Payer: Self-pay | Admitting: Internal Medicine

## 2012-12-26 ENCOUNTER — Encounter (HOSPITAL_COMMUNITY)
Admission: RE | Admit: 2012-12-26 | Discharge: 2012-12-26 | Disposition: A | Discharge status: Home / Self Care | Payer: Medicare Other | Source: Ambulatory Visit | Attending: Cardiology | Admitting: Cardiology

## 2012-12-26 ENCOUNTER — Ambulatory Visit (INDEPENDENT_AMBULATORY_CARE_PROVIDER_SITE_OTHER): Payer: Medicare Other | Admitting: Internal Medicine

## 2012-12-26 VITALS — BP 138/82 | HR 61 | Temp 97.7°F | Ht 70.0 in | Wt 249.0 lb

## 2012-12-26 DIAGNOSIS — J986 Disorders of diaphragm: Secondary | ICD-10-CM

## 2012-12-26 NOTE — Patient Instructions (Addendum)
#  Diaprhagm elevation  - not sure if this is a problem  - do sniff test; wil call with results  #Cough  - glad is better; if this is aproblem we will follow this   #FOllowup  - depends on sniff test result

## 2012-12-26 NOTE — Progress Notes (Signed)
Subjective:    Patient ID: Hayden Rivera, male    DOB: 19-Aug-1950, 62 y.o.   MRN: [id number]  PCP No PCP Per Patient CArdiologist - Dr [name] HPI  62 yo with history of CAD s/p recent admission in [place] 2014 with unstable angina presents for cardiology followup. In 5/14, patient was admitted with unstable angina. He had angioplasty to in-stent restenosis in the mid LAD and had DES to LCx. The procedure was complicated by probable distal embolization with peri-procedural infarction. EF was preserved on echo done this admission. After discharge, he was seen in the office with significant dyspnea. Brilinta was stopped and he was started on Effient. The dyspnea resolved. Metoprolol was stopped due to bradycardia.   As of Early July 2014 cardiology visit:  Mr Lipinski was doing well. He was playing golf and doing some walking for exercise. He denied exertional dyspnea or chest pain.    However, now 12/26/2012   He was, of note, found to have an elevated right hemidiaphragm May 2014  during recent hospitalization  REferred to pulmonary imaging. He says he had real bad severe dry cough priro to admission for Unstable angina in May 2014. Did some cement work. This was a dry cough but it resolved after admission; a week later. HE thinks might have been due to ace inhiibitor but he is not sure.  Currently only occassional mild dry cough that does not bother him. Current cough happpens at night. Cough not associatd with sinus drainage, no actoive heart burn symptoms but is on H2 blockade (did have some symptims around ACS admission in may 2014), wheezing  He does have chronic mild dyspnea when doing heavy work esp squatting; improved with rest. Stabl  CXR only available is May 2014 a portable that reported as right diaphragm elevation - looks normal to me. ANd June 2014 reported as normal - agree is normal   [employer]   reports that he quit smoking about 10 years ago. His smoking use included  Cigars and Cigarettes. He has a 5 pack-year smoking history. He does not have any smokeless tobacco history on file.        Past Medical History  Diagnosis Date  . Arthritis   . CAD (coronary artery disease)     a. Anterior STEMI 2005 c/b vfib arrest, PCI to LAD at [facility]. b. Stent thrombosis 2006 with DES within prior LAD stent at [facility]. C. 09/2012: s/p balloon angioplasty to mLAD for severe stenosis in previously stented segment & DES to LCx; initial enz neg but ruled in for NSTEMI after post-cath vagal sx, felt d/t distal emboliz of thrombus during case.  . Ischemic cardiomyopathy     a. Unclear prior EF but pt was told heart was weakened in past. b. EF normal 09/2012.  Marland Kitchen Ventricular fibrillation     a. VF arrest 2005 in setting of STEMI.  . Elevated hemidiaphragm     a. Noted 09/2012 - instructed to f/u PCP.  Marland Kitchen Myocardial infarction      Family History  Problem Relation Age of Onset  . Heart disease Mother      History   Social History  . Marital Status: Divorced    Spouse Name: N/A    Number of Children: N/A  . Years of Education: N/A   Occupational History  . retired    Social History Main Topics  . Smoking status: Former Smoker -- 0.50 packs/day for 10 years    Types: Cigars, Cigarettes  Quit date: 05/30/2002  . Smokeless tobacco: Not on file  . Alcohol Use: No  . Drug Use: No  . Sexually Active: Not Currently   Other Topics Concern  . Not on file   Social History Narrative  . No narrative on file     Allergies  Allergen Reactions  . Altace (Ramipril)     cough  . Penicillins Swelling     Outpatient Prescriptions Prior to Visit  Medication Sig Dispense Refill  . aspirin 81 MG tablet Take 1 tablet (81 mg total) by mouth daily.      . cholecalciferol (VITAMIN D) 1000 UNITS tablet Take 1,000 Units by mouth daily.      . isosorbide mononitrate (IMDUR) 30 MG 24 hr tablet Take 1 tablet (30 mg total) by mouth daily.  30 tablet  3  . losartan (COZAAR)  50 MG tablet Take 1 tablet (50 mg total) by mouth daily.  90 tablet  1  . Misc Natural Products (OSTEO BI-FLEX TRIPLE STRENGTH PO) Take 1 tablet by mouth 2 (two) times daily.      . nitroGLYCERIN (NITROSTAT) 0.4 MG SL tablet Place 1 tablet (0.4 mg total) under the tongue every 5 (five) minutes as needed for chest pain (up to 3 doses).  25 tablet  4  . Omega-3 Fatty Acids (FISH OIL) 1200 MG CAPS Take 1 capsule by mouth 2 (two) times daily.      . prasugrel (EFFIENT) 10 MG TABS Take 1 tablet (10 mg total) by mouth daily.  30 tablet  11  . Ranitidine HCl (ZANTAC 75 PO) Take by mouth daily.      . rosuvastatin (CRESTOR) 10 MG tablet Take 10 mg by mouth daily.       No facility-administered medications prior to visit.       Review of Systems  Constitutional: Negative for fever and unexpected weight change.  HENT: Negative for ear pain, nosebleeds, congestion, sore throat, rhinorrhea, sneezing, trouble swallowing, dental problem, postnasal drip and sinus pressure.   Eyes: Negative for redness and itching.  Respiratory: Positive for cough. Negative for chest tightness, shortness of breath and wheezing.   Cardiovascular: Positive for leg swelling ( "feel tight"). Negative for palpitations.  Gastrointestinal: Negative for nausea and vomiting.  Genitourinary: Negative for dysuria.  Musculoskeletal: Negative for joint swelling.  Skin: Negative for rash.  Neurological: Negative for headaches.  Hematological: Does not bruise/bleed easily.  Psychiatric/Behavioral: Negative for dysphoric mood. The patient is not nervous/anxious.    Current outpatient prescriptions:aspirin 81 MG tablet, Take 1 tablet (81 mg total) by mouth daily., Disp: , Rfl: ;  cholecalciferol (VITAMIN D) 1000 UNITS tablet, Take 1,000 Units by mouth daily., Disp: , Rfl: ;  isosorbide mononitrate (IMDUR) 30 MG 24 hr tablet, Take 1 tablet (30 mg total) by mouth daily., Disp: 30 tablet, Rfl: 3;  losartan (COZAAR) 50 MG tablet, Take 1  tablet (50 mg total) by mouth daily., Disp: 90 tablet, Rfl: 1 Misc Natural Products (OSTEO BI-FLEX TRIPLE STRENGTH PO), Take 1 tablet by mouth 2 (two) times daily., Disp: , Rfl: ;  nitroGLYCERIN (NITROSTAT) 0.4 MG SL tablet, Place 1 tablet (0.4 mg total) under the tongue every 5 (five) minutes as needed for chest pain (up to 3 doses)., Disp: 25 tablet, Rfl: 4;  Omega-3 Fatty Acids (FISH OIL) 1200 MG CAPS, Take 1 capsule by mouth 2 (two) times daily., Disp: , Rfl:  prasugrel (EFFIENT) 10 MG TABS, Take 1 tablet (10 mg total) by mouth daily.,  Disp: 30 tablet, Rfl: 11;  Ranitidine HCl (ZANTAC 75 PO), Take by mouth daily., Disp: , Rfl: ;  rosuvastatin (CRESTOR) 10 MG tablet, Take 10 mg by mouth daily., Disp: , Rfl:      Objective:   Physical Exam  Nursing note and vitals reviewed. Constitutional: He is oriented to person, place, and time. He appears well-developed and well-nourished. No distress.  Body mass index is 35.73 kg/(m^2).   HENT:  Head: Normocephalic and atraumatic.  Right Ear: External ear normal.  Left Ear: External ear normal.  Mouth/Throat: Oropharynx is clear and moist. No oropharyngeal exudate.  Eyes: Conjunctivae and EOM are normal. Pupils are equal, round, and reactive to light. Right eye exhibits no discharge. Left eye exhibits no discharge. No scleral icterus.  Neck: Normal range of motion. Neck supple. No JVD present. No tracheal deviation present. No thyromegaly present.  Cardiovascular: Normal rate, regular rhythm and intact distal pulses.  Exam reveals no gallop and no friction rub.   No murmur heard. Pulmonary/Chest: Effort normal and breath sounds normal. No respiratory distress. He has no wheezes. He has no rales. He exhibits no tenderness.  Abdominal: Soft. Bowel sounds are normal. He exhibits no distension and no mass. There is no tenderness. There is no rebound and no guarding.  Musculoskeletal: Normal range of motion. He exhibits no edema and no tenderness.   Lymphadenopathy:    He has no cervical adenopathy.  Neurological: He is alert and oriented to person, place, and time. He has normal reflexes. No cranial nerve deficit. Coordination normal.  Skin: Skin is warm and dry. No rash noted. He is not diaphoretic. No erythema. No pallor.  Psychiatric: He has a normal mood and affect. His behavior is normal. Judgment and thought content normal.          Assessment & Plan:

## 2012-12-28 ENCOUNTER — Encounter (HOSPITAL_COMMUNITY): Payer: Medicare Other

## 2012-12-31 ENCOUNTER — Encounter (HOSPITAL_COMMUNITY)
Admission: RE | Admit: 2012-12-31 | Discharge: 2012-12-31 | Disposition: A | Discharge status: Home / Self Care | Payer: Medicare Other | Source: Ambulatory Visit | Attending: Cardiology | Admitting: Cardiology

## 2012-12-31 DIAGNOSIS — I252 Old myocardial infarction: Secondary | ICD-10-CM | POA: Insufficient documentation

## 2012-12-31 DIAGNOSIS — Z5189 Encounter for other specified aftercare: Secondary | ICD-10-CM | POA: Insufficient documentation

## 2013-01-01 DIAGNOSIS — J986 Disorders of diaphragm: Secondary | ICD-10-CM | POA: Insufficient documentation

## 2013-01-01 NOTE — Assessment & Plan Note (Signed)
I am not very convinced he has elevated Rt hemidiaphramgm. Wil get sniff test to sort out.

## 2013-01-02 ENCOUNTER — Encounter (HOSPITAL_COMMUNITY)
Admission: RE | Admit: 2013-01-02 | Discharge: 2013-01-02 | Disposition: A | Discharge status: Home / Self Care | Payer: Medicare Other | Source: Ambulatory Visit | Attending: Cardiology | Admitting: Cardiology

## 2013-01-03 ENCOUNTER — Ambulatory Visit (HOSPITAL_COMMUNITY)
Admission: RE | Admit: 2013-01-03 | Discharge: 2013-01-03 | Disposition: A | Discharge status: Home / Self Care | Payer: Medicare Other | Source: Ambulatory Visit | Attending: Internal Medicine | Admitting: Internal Medicine

## 2013-01-03 DIAGNOSIS — R918 Other nonspecific abnormal finding of lung field: Secondary | ICD-10-CM | POA: Insufficient documentation

## 2013-01-03 DIAGNOSIS — J986 Disorders of diaphragm: Secondary | ICD-10-CM

## 2013-01-04 ENCOUNTER — Encounter (HOSPITAL_COMMUNITY)
Admission: RE | Admit: 2013-01-04 | Discharge: 2013-01-04 | Disposition: A | Discharge status: Home / Self Care | Payer: Medicare Other | Source: Ambulatory Visit | Attending: Cardiology | Admitting: Cardiology

## 2013-01-06 ENCOUNTER — Telehealth: Payer: Self-pay | Admitting: Internal Medicine

## 2013-01-06 NOTE — Telephone Encounter (Signed)
Let him know siff test 01/03/13 is normal. So I do not think right diaphragm is paralyzed. SO no further fu from that standpoing. However, if he needs evaluation for his shortness of breath when he bends down; likely weight related. I could order some PFT etc., or he can watch it and return if problematic   Dr. Kalman Shan, M.D., [facility].C.P Pulmonary and Critical Care Medicine Staff Physician [facility] Pager: [phone], If no answer or between  15:00h - 7:00h: call [phone]  01/06/2013 9:22 PM

## 2013-01-07 ENCOUNTER — Encounter (HOSPITAL_COMMUNITY)
Admission: RE | Admit: 2013-01-07 | Discharge: 2013-01-07 | Disposition: A | Discharge status: Home / Self Care | Payer: Medicare Other | Source: Ambulatory Visit | Attending: Cardiology | Admitting: Cardiology

## 2013-01-09 ENCOUNTER — Encounter (HOSPITAL_COMMUNITY)
Admission: RE | Admit: 2013-01-09 | Discharge: 2013-01-09 | Disposition: A | Discharge status: Home / Self Care | Payer: Medicare Other | Source: Ambulatory Visit | Attending: Cardiology | Admitting: Cardiology

## 2013-01-09 NOTE — Telephone Encounter (Signed)
[email or web address].Jennifer Castillo, CMA  

## 2013-01-10 NOTE — Telephone Encounter (Signed)
Returning call can be reached at [phone].[name]

## 2013-01-10 NOTE — Telephone Encounter (Signed)
Called, spoke with pt - Informed him of below results and recs.  He verbalized understanding of both.  Pt would like to wait to see if the SOB when bending over becomes problematic. If so, he will call back.  Nothing further needed at this time.  Will sign off.

## 2013-01-11 ENCOUNTER — Encounter (HOSPITAL_COMMUNITY)
Admission: RE | Admit: 2013-01-11 | Discharge: 2013-01-11 | Disposition: A | Discharge status: Home / Self Care | Payer: Medicare Other | Source: Ambulatory Visit | Attending: Cardiology | Admitting: Cardiology

## 2013-01-14 ENCOUNTER — Encounter (HOSPITAL_COMMUNITY)
Admission: RE | Admit: 2013-01-14 | Discharge: 2013-01-14 | Disposition: A | Discharge status: Home / Self Care | Payer: Medicare Other | Source: Ambulatory Visit | Attending: Cardiology | Admitting: Cardiology

## 2013-01-16 ENCOUNTER — Encounter (HOSPITAL_COMMUNITY)
Admission: RE | Admit: 2013-01-16 | Discharge: 2013-01-16 | Disposition: A | Discharge status: Home / Self Care | Payer: Medicare Other | Source: Ambulatory Visit | Attending: Cardiology | Admitting: Cardiology

## 2013-01-18 ENCOUNTER — Encounter (HOSPITAL_COMMUNITY)
Admission: RE | Admit: 2013-01-18 | Discharge: 2013-01-18 | Disposition: A | Discharge status: Home / Self Care | Payer: Medicare Other | Source: Ambulatory Visit | Attending: Cardiology | Admitting: Cardiology

## 2013-01-21 ENCOUNTER — Encounter (HOSPITAL_COMMUNITY)
Admission: RE | Admit: 2013-01-21 | Discharge: 2013-01-21 | Disposition: A | Discharge status: Home / Self Care | Payer: Medicare Other | Source: Ambulatory Visit | Attending: Cardiology | Admitting: Cardiology

## 2013-01-23 ENCOUNTER — Encounter (HOSPITAL_COMMUNITY)
Admission: RE | Admit: 2013-01-23 | Discharge: 2013-01-23 | Disposition: A | Discharge status: Home / Self Care | Payer: Medicare Other | Source: Ambulatory Visit | Attending: Cardiology | Admitting: Cardiology

## 2013-01-25 ENCOUNTER — Encounter (HOSPITAL_COMMUNITY): Payer: Medicare Other

## 2013-01-28 ENCOUNTER — Encounter (HOSPITAL_COMMUNITY): Payer: Medicare Other

## 2013-01-30 ENCOUNTER — Encounter (HOSPITAL_COMMUNITY)
Admission: RE | Admit: 2013-01-30 | Discharge: 2013-01-30 | Disposition: A | Discharge status: Home / Self Care | Payer: Medicare Other | Source: Ambulatory Visit | Attending: Cardiology | Admitting: Cardiology

## 2013-01-30 DIAGNOSIS — I252 Old myocardial infarction: Secondary | ICD-10-CM | POA: Insufficient documentation

## 2013-01-30 DIAGNOSIS — Z5189 Encounter for other specified aftercare: Secondary | ICD-10-CM | POA: Insufficient documentation

## 2013-02-01 ENCOUNTER — Encounter (HOSPITAL_COMMUNITY)
Admission: RE | Admit: 2013-02-01 | Discharge: 2013-02-01 | Disposition: A | Discharge status: Home / Self Care | Payer: Medicare Other | Source: Ambulatory Visit | Attending: Cardiology | Admitting: Cardiology

## 2013-02-04 ENCOUNTER — Encounter (HOSPITAL_COMMUNITY)
Admission: RE | Admit: 2013-02-04 | Discharge: 2013-02-04 | Disposition: A | Discharge status: Home / Self Care | Payer: Medicare Other | Source: Ambulatory Visit | Attending: Cardiology | Admitting: Cardiology

## 2013-02-06 ENCOUNTER — Encounter (HOSPITAL_COMMUNITY)
Admission: RE | Admit: 2013-02-06 | Discharge: 2013-02-06 | Disposition: A | Discharge status: Home / Self Care | Payer: Medicare Other | Source: Ambulatory Visit | Attending: Cardiology | Admitting: Cardiology

## 2013-02-08 ENCOUNTER — Encounter (HOSPITAL_COMMUNITY)
Admission: RE | Admit: 2013-02-08 | Discharge: 2013-02-08 | Disposition: A | Discharge status: Home / Self Care | Payer: Medicare Other | Source: Ambulatory Visit | Attending: Cardiology | Admitting: Cardiology

## 2013-02-11 ENCOUNTER — Encounter (HOSPITAL_COMMUNITY)
Admission: RE | Admit: 2013-02-11 | Discharge: 2013-02-11 | Disposition: A | Discharge status: Home / Self Care | Payer: Medicare Other | Source: Ambulatory Visit | Attending: Cardiology | Admitting: Cardiology

## 2013-02-13 ENCOUNTER — Encounter (HOSPITAL_COMMUNITY)
Admission: RE | Admit: 2013-02-13 | Discharge: 2013-02-13 | Disposition: A | Discharge status: Home / Self Care | Payer: Medicare Other | Source: Ambulatory Visit | Attending: Cardiology | Admitting: Cardiology

## 2013-02-15 ENCOUNTER — Encounter (HOSPITAL_COMMUNITY)
Admission: RE | Admit: 2013-02-15 | Discharge: 2013-02-15 | Disposition: A | Discharge status: Home / Self Care | Payer: Medicare Other | Source: Ambulatory Visit | Attending: Cardiology | Admitting: Cardiology

## 2013-02-18 ENCOUNTER — Encounter (HOSPITAL_COMMUNITY)
Admission: RE | Admit: 2013-02-18 | Discharge: 2013-02-18 | Disposition: A | Discharge status: Home / Self Care | Payer: Medicare Other | Source: Ambulatory Visit | Attending: Cardiology | Admitting: Cardiology

## 2013-02-20 ENCOUNTER — Encounter (HOSPITAL_COMMUNITY)
Admission: RE | Admit: 2013-02-20 | Discharge: 2013-02-20 | Disposition: A | Discharge status: Home / Self Care | Payer: Medicare Other | Source: Ambulatory Visit | Attending: Cardiology | Admitting: Cardiology

## 2013-02-22 ENCOUNTER — Encounter (HOSPITAL_COMMUNITY)
Admission: RE | Admit: 2013-02-22 | Discharge: 2013-02-22 | Disposition: A | Discharge status: Home / Self Care | Payer: Medicare Other | Source: Ambulatory Visit | Attending: Cardiology | Admitting: Cardiology

## 2013-02-25 ENCOUNTER — Encounter (HOSPITAL_COMMUNITY): Payer: Medicare Other

## 2013-02-27 ENCOUNTER — Encounter (HOSPITAL_COMMUNITY): Payer: Medicare Other

## 2013-03-01 ENCOUNTER — Encounter (HOSPITAL_COMMUNITY): Payer: Medicare Other

## 2013-03-04 ENCOUNTER — Encounter (HOSPITAL_COMMUNITY)
Admission: RE | Admit: 2013-03-04 | Discharge: 2013-03-04 | Disposition: A | Discharge status: Home / Self Care | Payer: Medicare Other | Source: Ambulatory Visit | Attending: Cardiology | Admitting: Cardiology

## 2013-03-04 DIAGNOSIS — Z5189 Encounter for other specified aftercare: Secondary | ICD-10-CM | POA: Insufficient documentation

## 2013-03-04 DIAGNOSIS — I252 Old myocardial infarction: Secondary | ICD-10-CM | POA: Insufficient documentation

## 2013-03-06 ENCOUNTER — Encounter (HOSPITAL_COMMUNITY): Payer: Medicare Other

## 2013-03-08 ENCOUNTER — Encounter (HOSPITAL_COMMUNITY): Payer: Medicare Other

## 2013-03-11 ENCOUNTER — Encounter (HOSPITAL_COMMUNITY): Payer: Medicare Other

## 2013-03-12 ENCOUNTER — Other Ambulatory Visit: Payer: Self-pay | Admitting: Nurse Practitioner

## 2013-03-13 ENCOUNTER — Encounter (HOSPITAL_COMMUNITY)
Admission: RE | Admit: 2013-03-13 | Discharge: 2013-03-13 | Disposition: A | Discharge status: Home / Self Care | Payer: Medicare Other | Source: Ambulatory Visit | Attending: Cardiology | Admitting: Cardiology

## 2013-03-15 ENCOUNTER — Encounter (HOSPITAL_COMMUNITY)
Admission: RE | Admit: 2013-03-15 | Discharge: 2013-03-15 | Disposition: A | Discharge status: Home / Self Care | Payer: Medicare Other | Source: Ambulatory Visit | Attending: Cardiology | Admitting: Cardiology

## 2013-03-18 ENCOUNTER — Encounter (HOSPITAL_COMMUNITY)
Admission: RE | Admit: 2013-03-18 | Discharge: 2013-03-18 | Disposition: A | Discharge status: Home / Self Care | Payer: Medicare Other | Source: Ambulatory Visit | Attending: Cardiology | Admitting: Cardiology

## 2013-03-20 ENCOUNTER — Encounter (HOSPITAL_COMMUNITY)
Admission: RE | Admit: 2013-03-20 | Discharge: 2013-03-20 | Disposition: A | Discharge status: Home / Self Care | Payer: Medicare Other | Source: Ambulatory Visit | Attending: Cardiology | Admitting: Cardiology

## 2013-03-22 ENCOUNTER — Encounter (HOSPITAL_COMMUNITY)
Admission: RE | Admit: 2013-03-22 | Discharge: 2013-03-22 | Disposition: A | Discharge status: Home / Self Care | Payer: Medicare Other | Source: Ambulatory Visit | Attending: Cardiology | Admitting: Cardiology

## 2013-03-25 ENCOUNTER — Encounter (HOSPITAL_COMMUNITY)
Admission: RE | Admit: 2013-03-25 | Discharge: 2013-03-25 | Disposition: A | Discharge status: Home / Self Care | Payer: Medicare Other | Source: Ambulatory Visit | Attending: Cardiology | Admitting: Cardiology

## 2013-03-27 ENCOUNTER — Encounter (HOSPITAL_COMMUNITY)
Admission: RE | Admit: 2013-03-27 | Discharge: 2013-03-27 | Disposition: A | Discharge status: Home / Self Care | Payer: Medicare Other | Source: Ambulatory Visit | Attending: Cardiology | Admitting: Cardiology

## 2013-03-29 ENCOUNTER — Encounter (HOSPITAL_COMMUNITY)
Admission: RE | Admit: 2013-03-29 | Discharge: 2013-03-29 | Disposition: A | Discharge status: Home / Self Care | Payer: Medicare Other | Source: Ambulatory Visit | Attending: Cardiology | Admitting: Cardiology

## 2013-04-01 ENCOUNTER — Encounter (HOSPITAL_COMMUNITY)
Admission: RE | Admit: 2013-04-01 | Discharge: 2013-04-01 | Disposition: A | Discharge status: Home / Self Care | Payer: Medicare Other | Source: Ambulatory Visit | Attending: Cardiology | Admitting: Cardiology

## 2013-04-01 DIAGNOSIS — I252 Old myocardial infarction: Secondary | ICD-10-CM | POA: Insufficient documentation

## 2013-04-01 DIAGNOSIS — Z5189 Encounter for other specified aftercare: Secondary | ICD-10-CM | POA: Insufficient documentation

## 2013-04-03 ENCOUNTER — Encounter (HOSPITAL_COMMUNITY)
Admission: RE | Admit: 2013-04-03 | Discharge: 2013-04-03 | Disposition: A | Discharge status: Home / Self Care | Payer: Medicare Other | Source: Ambulatory Visit | Attending: Cardiology | Admitting: Cardiology

## 2013-04-05 ENCOUNTER — Encounter (HOSPITAL_COMMUNITY)
Admission: RE | Admit: 2013-04-05 | Discharge: 2013-04-05 | Disposition: A | Discharge status: Home / Self Care | Payer: Medicare Other | Source: Ambulatory Visit | Attending: Cardiology | Admitting: Cardiology

## 2013-04-05 NOTE — Progress Notes (Signed)
Patient graduated from Cardiac Rehabilitation today on 04/05/13 after completing 36 sessions. He achieved LTG of 30 minutes of aerobic exercise at Max Met level of 3.89. All patients vitals are WNL. Patient has met with dietician. Discharge instruction has been reviewed in detail and patient stated an understanding of material given. Patient plans to exercise at the [facility] and walking. Cardiac Rehab staff will make f/u calls at 1 month, 6 months, and 1 year. Patient had no complaints of any abnormal S/S or pain on their exit visit. Patient completed 6 minute walk test on 04/03/13.

## 2013-04-08 ENCOUNTER — Encounter (INDEPENDENT_AMBULATORY_CARE_PROVIDER_SITE_OTHER): Payer: Self-pay

## 2013-04-08 ENCOUNTER — Ambulatory Visit (INDEPENDENT_AMBULATORY_CARE_PROVIDER_SITE_OTHER): Payer: Medicare Other | Admitting: Cardiology

## 2013-04-08 ENCOUNTER — Encounter: Payer: Self-pay | Admitting: Cardiology

## 2013-04-08 ENCOUNTER — Encounter (HOSPITAL_COMMUNITY): Payer: Medicare Other

## 2013-04-08 VITALS — BP 138/60 | HR 50 | Ht 71.0 in | Wt 246.0 lb

## 2013-04-08 DIAGNOSIS — E785 Hyperlipidemia, unspecified: Secondary | ICD-10-CM

## 2013-04-08 DIAGNOSIS — I498 Other specified cardiac arrhythmias: Secondary | ICD-10-CM

## 2013-04-08 DIAGNOSIS — I2510 Atherosclerotic heart disease of native coronary artery without angina pectoris: Secondary | ICD-10-CM

## 2013-04-08 DIAGNOSIS — R001 Bradycardia, unspecified: Secondary | ICD-10-CM

## 2013-04-08 DIAGNOSIS — R002 Palpitations: Secondary | ICD-10-CM

## 2013-04-08 DIAGNOSIS — I10 Essential (primary) hypertension: Secondary | ICD-10-CM

## 2013-04-08 NOTE — Progress Notes (Signed)
Patient ID: Hayden Rivera, male   DOB: 1951-05-24, 62 y.o.   MRN: [id number] PCP: At [facility]  62 yo with history of CAD presents for cardiology followup.  In 5/14, patient was admitted with unstable angina.  He had angioplasty to in-stent restenosis in the mid LAD and had DES to LCx.  The procedure was complicated by probable distal embolization with peri-procedural infarction.  EF was preserved on echo done 5/14.  After discharge, he was seen in the office with significant dyspnea.  Brilinta was stopped and he was started on Effient.  The dyspnea resolved.  Metoprolol was stopped due to bradycardia.  He has completed cardiac rehab at [facility].   Currently, Hayden Rivera is doing well.  He is playing golf and doing some walking for exercise.  He does yardwork on 3 acres.  No exertional dyspnea.  He had 1 episode of chest pain about 1 month ago while watching TV.  This resolved on its own in about 10 minutes with no recurrence. There had been some concern about elevated right hemidiaphragm but sniff test was normal.   Labs (6/14): K 4.3, creatinine 0.9, BNP 44  PMH: 1. CAD: Anterior STEMI in 2005 complicated by ventricular fibrillation arrest.  PCI to LAD at [facility].  Stent thrombosis in 2006, had DES to LAD in [place].  5/14 had balloon angioplasty to mid LAD (in-stent restenosis) and DES to LCx for unstable angina.  Echo (5/14) with EF 60%, no regional wall motion abnormalities, aortic sclerosis without stenosis.  2. 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  3. Hyperlipidemia. 4. Dyspnea with Brilinta.  5. HTN 6. Palpitations: Holter (7/14) occasional PVCs and PACs.     SH: Retired, prior smoker, lives in [place].   FH: CAD  ROS: All systems reviewed and negative except as per HPI.   Current Outpatient Prescriptions  Medication Sig Dispense Refill  . aspirin 81 MG tablet Take 1 tablet (81 mg total) by mouth daily.      . cholecalciferol (VITAMIN D) 1000  UNITS tablet Vitamin d3 2000 IU DAILY      . isosorbide mononitrate (IMDUR) 30 MG 24 hr tablet TAKE ONE TABLET BY MOUTH ONCE EVERY DAY  30 tablet  3  . losartan (COZAAR) 50 MG tablet Take 1 tablet (50 mg total) by mouth daily.  90 tablet  1  . Misc Natural Products (OSTEO BI-FLEX TRIPLE STRENGTH PO) Take 1 tablet by mouth 2 (two) times daily.      . nitroGLYCERIN (NITROSTAT) 0.4 MG SL tablet Place 1 tablet (0.4 mg total) under the tongue every 5 (five) minutes as needed for chest pain (up to 3 doses).  25 tablet  4  . Omega-3 Fatty Acids (FISH OIL) 1200 MG CAPS Take 1 capsule by mouth 2 (two) times daily.      . prasugrel (EFFIENT) 10 MG TABS Take 1 tablet (10 mg total) by mouth daily.  30 tablet  11  . rosuvastatin (CRESTOR) 10 MG tablet Take 10 mg by mouth daily.       No current facility-administered medications for this visit.    BP 138/60  Pulse 50  Ht 5\' 11"  (1.803 m)  Wt 111.585 kg (246 lb)  BMI 34.33 kg/m2 General: NAD, obese Neck: No JVD, no thyromegaly or thyroid nodule.  Lungs: Slight decreased breath sounds right base.  CV: Nondisplaced PMI.  Heart regular S1/S2, no S3/S4, no murmur.  No peripheral edema.  No carotid  bruit.  Normal pedal pulses.  Abdomen: Soft, nontender, no hepatosplenomegaly, no distention.  Skin: Intact without lesions or rashes.  Neurologic: Alert and oriented x 3.  Psych: Normal affect. Extremities: No clubbing or cyanosis.   Assessment/Plan: 1. CAD: Stable with no ischemic symptoms.  EF preserved on echo in 5/14.  Dyspnea resolved off Brilinta.   - Continue Effient until 5/15, will switch back to Plavix at that point for long-term therapy given history of stent thrombosis. - Continue ASA 81, Imdur, ARB, statin.  - No beta blocker with bradycardia while on low dose metoprolol. 2. Palpitations: PVCs and PACs on holter in 7/14.  These seem to have recovered.  3. HTN: BP controlled.  4. Hyperlipidemia: Check lipids today.    Hayden Rivera 04/08/2013

## 2013-04-08 NOTE — Patient Instructions (Signed)
Your physician recommends that you have  lab work today--Lipid profile/Liver profile/BMET.  Your physician wants you to follow-up in: 6 months with Dr Shirlee Latch. (May 2015). You will receive a reminder letter in the mail two months in advance. If you don't receive a letter, please call our office to schedule the follow-up appointment.

## 2013-04-09 LAB — LIPID PANEL
Cholesterol: 132 mg/dL (ref 0–200)
HDL: 51 mg/dL (ref >39.00)

## 2013-04-09 LAB — BASIC METABOLIC PANEL
BUN: 17 mg/dL (ref 6–23)
Calcium: 9.4 mg/dL (ref 8.4–10.5)
GFR: 87.22 mL/min (ref >60.00)
Glucose, Bld: 105 mg/dL — ABNORMAL HIGH (ref 70–99)

## 2013-04-09 LAB — HEPATIC FUNCTION PANEL
ALT: 36 U/L (ref 0–53)
AST: 31 U/L (ref 0–37)
Bilirubin, Direct: 0.2 mg/dL (ref 0.0–0.3)
Total Bilirubin: 1 mg/dL (ref 0.3–1.2)

## 2013-04-10 NOTE — Progress Notes (Signed)
Pt.notified

## 2013-06-01 ENCOUNTER — Other Ambulatory Visit: Payer: Self-pay | Admitting: Cardiology

## 2013-06-04 NOTE — Progress Notes (Signed)
Cardiac Rehabilitation Program Outcomes Report   Orientation:  12/18/2012 1st week report: 12/31/2012 Graduate Date:  tbd Discharge Date:  tbd # of sessions completed: 3 DX: Stent X 1   Cardiologist: Arville Care MD:  [facility] Class Time:  08:15  A.  Exercise Program:  Tolerates exercise @ 3.89 METS for 15 minutes and Walk Test Results:  Pre: Pre Walk Test: Resting HR 65, BP 122/70, O297%, RPE 7 and RPD 7, 53minHR 87, BP 150/80, O298%, RPE11 and RPD 12, Post HR 58, BP 120/62, O2 96 %, RPE 7 and RPD 9 walked 1600 ft at 3.0 mph at 3.3 METS.  B.  Mental Health:  Good mental attitude  C.  Education/Instruction/Skills  Accurately checks own pulse.  Rest:  53  Exercise: 108, Knows THR for exercise and Uses Perceived Exertion Scale and/or Dyspnea Scale  Uses Perceived Exertion Scale and/or Dyspnea Scale  D.  Nutrition/Weight Control/Body Composition:  Adherence to prescribed nutrition program: good    E.  Blood Lipids    Lab Results  Component Value Date   CHOL 132 04/08/2013   HDL 51.00 04/08/2013   LDLCALC 62 04/08/2013   TRIG 93.0 04/08/2013   CHOLHDL 3 04/08/2013    F.  Lifestyle Changes:  Making positive lifestyle changes and Not smoking:  Quit 2004  G.  Symptoms noted with exercise:  Asymptomatic  Report Completed By:  Oletta Lamas. Sondi Desch RN   Comments:  This is patients 1st week report. He has done well his first week. He achieved a peak METS 56f 3.89. His resting HR was 53 and resting BP was 112/60, His peak HR was 108 and Peak BP was 122/70. A report will follow upon his halfway session.

## 2013-06-04 NOTE — Addendum Note (Signed)
Encounter addended by: Norlene Duel, RN on: 06/04/2013 11:54 AM<BR>     Documentation filed: Notes Section

## 2013-06-04 NOTE — Addendum Note (Signed)
Encounter addended by: Norlene Duel, RN on: 06/04/2013 11:45 AM<BR>     Documentation filed: Clinical Notes

## 2013-06-04 NOTE — Addendum Note (Signed)
Encounter addended by: Norlene Duel, RN on: 06/04/2013 12:07 PM<BR>     Documentation filed: Notes Section

## 2013-06-04 NOTE — Progress Notes (Signed)
Cardiac Rehabilitation Program07/22/2014 Outcomes Report   Orientation:  12/18/2012 Halfway Report: 02/08/2013 Graduate Date:  tbd Discharge Date:  tbd # of sessions completed: 18 DX: Stent X 1  Cardiologist: Arville Care MD:  Juliann Pulse Drs Class Time:  08:15  A.  Exercise Program:  Tolerates exercise @ 3.80 METS for 15 minutes  B.  Mental Health:  Good mental attitude  C.  Education/Instruction/Skills  Accurately checks own pulse.  Rest:  65  Exercise:  108, Knows THR for exercise and Uses Perceived Exertion Scale and/or Dyspnea Scale  Uses Perceived Exertion Scale and/or Dyspnea Scale  D.  Nutrition/Weight Control/Body Composition:  Adherence to prescribed nutrition program: good    E.  Blood Lipids    Lab Results  Component Value Date   CHOL 132 04/08/2013   HDL 51.00 04/08/2013   LDLCALC 62 04/08/2013   TRIG 93.0 04/08/2013   CHOLHDL 3 04/08/2013    F.  Lifestyle Changes:  Making positive lifestyle changes and Not smoking:  Quit 2004  G.  Symptoms noted with exercise:  Asymptomatic  Report Completed By:  Oletta Lamas. Genifer Lazenby RN   Comments:  This is patients halfway report. HE achieved a peak METS of 3.80. His resting HR was 65 and resting BP was 120/62. His peak HR was 108 and His peak BP was 122/68. A graduation report will follow upon the 53 th visit.

## 2013-06-04 NOTE — Addendum Note (Signed)
Encounter addended by: Norlene Duel, RN on: 06/04/2013 12:07 PM<BR>     Documentation filed: Clinical Notes

## 2013-06-04 NOTE — Addendum Note (Signed)
Encounter addended by: Norlene Duel, RN on: 06/04/2013 11:44 AM<BR>     Documentation filed: Notes Section

## 2013-06-04 NOTE — Progress Notes (Signed)
Cardiac Rehabilitation Program Outcomes Report   Orientation:  O7/22/2014 Graduate Date:  04/05/2013 Discharge Date:  04/05/2013 # of sessions completed: 36 DX: Stent X 1  Cardiologist: Loralie Champagne Family MD:  [facility] Class Time:  08:15  A.  Exercise Program:  Tolerates exercise @ 3.89 METS for 15 minutes and Walk Test Results:  Post: Post walk test: Rest HR 67, BP 110/62, O2 97%,RPE 7, and RPD 7, 6 min hr 102, BP 180/80, O2 97%, RPE 11,and RPD 11, Post HR 64, BP 132/70 O2 97%, RPE 7 and RPD 7. Walked 2050 feet at 3.8 mph at 3.97 METS  B.  Mental Health:  Good mental attitude  C.  Education/Instruction/Skills  Accurately checks own pulse.  Rest:  76  Exercise:  103, Knows THR for exercise, Uses Perceived Exertion Scale and/or Dyspnea Scale and Attended 13  education classes  Uses Perceived Exertion Scale and/or Dyspnea Scale  D.  Nutrition/Weight Control/Body Composition:  Adherence to prescribed nutrition program: good    E.  Blood Lipids    Lab Results  Component Value Date   CHOL 132 04/08/2013   HDL 51.00 04/08/2013   LDLCALC 62 04/08/2013   TRIG 93.0 04/08/2013   CHOLHDL 3 04/08/2013    F.  Lifestyle Changes:  Making positive lifestyle changes and Not smoking:  Quit 2004  G.  Symptoms noted with exercise:  Asymptomatic  Report Completed By:  Oletta Lamas. Jovane Foutz RN   Comments:  This is patients graduation report. He has done very well while in rehab. His resting hr was 76 and resting BP was 120/70, peak HR was 103 and Peak BP was 140/70. A call will be given to the patient at 1 month, ^ months and at 1 year to comfirm compliance with exercise or not.

## 2013-06-04 NOTE — Addendum Note (Signed)
Encounter addended by: Norlene Duel, RN on: 06/04/2013 11:55 AM<BR>     Documentation filed: Clinical Notes

## 2013-07-09 ENCOUNTER — Other Ambulatory Visit: Payer: Self-pay | Admitting: Nurse Practitioner

## 2013-07-30 ENCOUNTER — Other Ambulatory Visit: Payer: Self-pay | Admitting: *Deleted

## 2013-07-30 MED ORDER — ISOSORBIDE MONONITRATE ER 30 MG PO TB24: ORAL_TABLET | ORAL | Status: DC

## 2013-07-30 MED ORDER — LOSARTAN POTASSIUM 50 MG PO TABS: ORAL_TABLET | ORAL | Status: DC

## 2013-07-30 MED ORDER — ROSUVASTATIN CALCIUM 10 MG PO TABS: 10.0000 mg | ORAL_TABLET | Freq: Every day | ORAL | Status: DC

## 2013-07-30 MED ORDER — PRASUGREL HCL 10 MG PO TABS
10.0000 mg | ORAL_TABLET | Freq: Every day | ORAL | Status: DC
Start: 2013-07-30 — End: 2013-10-01

## 2013-08-12 ENCOUNTER — Ambulatory Visit: Payer: Self-pay | Admitting: General Practice

## 2013-08-31 ENCOUNTER — Other Ambulatory Visit: Payer: Self-pay | Admitting: Cardiology

## 2013-09-27 ENCOUNTER — Other Ambulatory Visit: Payer: Self-pay | Admitting: Cardiology

## 2013-10-01 ENCOUNTER — Other Ambulatory Visit: Payer: Self-pay | Admitting: Cardiology

## 2013-10-11 ENCOUNTER — Encounter: Payer: Self-pay | Admitting: Cardiology

## 2013-10-11 ENCOUNTER — Ambulatory Visit (INDEPENDENT_AMBULATORY_CARE_PROVIDER_SITE_OTHER): Payer: Commercial Managed Care - HMO | Admitting: Cardiology

## 2013-10-11 VITALS — BP 141/86 | HR 58 | Ht 71.0 in | Wt 255.0 lb

## 2013-10-11 DIAGNOSIS — I10 Essential (primary) hypertension: Secondary | ICD-10-CM

## 2013-10-11 DIAGNOSIS — R002 Palpitations: Secondary | ICD-10-CM

## 2013-10-11 DIAGNOSIS — Z0000 Encounter for general adult medical examination without abnormal findings: Secondary | ICD-10-CM

## 2013-10-11 DIAGNOSIS — E785 Hyperlipidemia, unspecified: Secondary | ICD-10-CM

## 2013-10-11 DIAGNOSIS — I2510 Atherosclerotic heart disease of native coronary artery without angina pectoris: Secondary | ICD-10-CM

## 2013-10-11 MED ORDER — LOSARTAN POTASSIUM 50 MG PO TABS: 50.0000 mg | ORAL_TABLET | Freq: Every day | ORAL | Status: DC

## 2013-10-11 NOTE — Patient Instructions (Addendum)
Your physician wants you to follow-up in: 6 months with Dr Aundra Dubin with lipid profile sameday You will receive a reminder letter in the mail two months in advance. If you don't receive a letter, please call our office to schedule the follow-up appointment.   Your physician has recommended you make the following change in your medication:  1) Stop Effient in 3 months---call 1 week prior to running out of med and we will call in Plavix for you 2) Start Plavix 75mg  the day after you stop Effient  You have been referred to [facility]-- for PCP per Dr Aundra Dubin

## 2013-10-13 NOTE — Progress Notes (Signed)
Patient ID: Hayden Rivera, male   DOB: [birth year], 63 y.o.   MRN: [id number] PCP: None  63 yo with history of CAD presents for cardiology followup.  In 5/14, patient was admitted with unstable angina.  He had angioplasty to in-stent restenosis in the mid LAD and had DES to LCx.  The procedure was complicated by probable distal embolization with peri-procedural infarction.  EF was preserved on echo done 5/14.  After discharge, he was seen in the office with significant dyspnea.  Brilinta was stopped and he was started on Effient.  The dyspnea resolved.  Metoprolol was stopped due to bradycardia.    Currently, Mr Minder is doing well.  He is doing a lot of yardwork, now lives near [place].  He has gained 9 lbs likely through eating more and less exercise.  No exertional dyspnea.  He is able to mow and weed-eat with no problems.  No chest pain.    Labs (6/14): K 4.3, creatinine 0.9, BNP 44 Labs (11/14): K 3.8, creatinine 0.9, LDL 62, HDL 51  ECG: sinus bradycardia with PVC, left axis deviation, old ASMI  PMH: 1. CAD: Anterior STEMI in 3474 complicated by ventricular fibrillation arrest.  PCI to LAD at [facility].  Stent thrombosis in 2006, had DES to LAD in [place].  5/14 had balloon angioplasty to mid LAD (in-stent restenosis) and DES to LCx for unstable angina.  Echo (5/14) with EF 60%, no regional wall motion abnormalities, aortic sclerosis without stenosis.  2. 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  3. Hyperlipidemia. 4. Dyspnea with Brilinta.  5. HTN 6. Palpitations: Holter (7/14) occasional PVCs and PACs.     SH: Retired, prior smoker, lives in [place].   FH: CAD  ROS: All systems reviewed and negative except as per HPI.   Current Outpatient Prescriptions  Medication Sig Dispense Refill  . aspirin 81 MG tablet Take 1 tablet (81 mg total) by mouth daily.      . cholecalciferol (VITAMIN D) 1000 UNITS tablet Vitamin d3 2000 IU DAILY      . EFFIENT 10  MG TABS tablet TAKE 1 TABLET EVERY DAY  90 tablet  0  . isosorbide mononitrate (IMDUR) 30 MG 24 hr tablet TAKE 1 TABLET ONE TIME EVERY DAY  90 tablet  0  . losartan (COZAAR) 50 MG tablet Take 1 tablet (50 mg total) by mouth daily.  90 tablet  3  . Misc Natural Products (OSTEO BI-FLEX TRIPLE STRENGTH PO) Take 1 tablet by mouth 2 (two) times daily.      . nitroGLYCERIN (NITROSTAT) 0.4 MG SL tablet Place 1 tablet (0.4 mg total) under the tongue every 5 (five) minutes as needed for chest pain (up to 3 doses).  25 tablet  4  . Omega-3 Fatty Acids (FISH OIL) 1200 MG CAPS Take 1 capsule by mouth 2 (two) times daily.      . rosuvastatin (CRESTOR) 10 MG tablet Take 1 tablet (10 mg total) by mouth daily.  90 tablet  0   No current facility-administered medications for this visit.    BP 141/86  Pulse 58  Ht 5\' 11"  (1.803 m)  Wt 115.667 kg (255 lb)  BMI 35.58 kg/m2 General: NAD, obese Neck: No JVD, no thyromegaly or thyroid nodule.  Lungs: Slight decreased breath sounds right base.  CV: Nondisplaced PMI.  Heart regular S1/S2, no S3/S4, 1/6 SEM RUSB.  No peripheral edema.  No carotid bruit. Normal pedal pulses.  Abdomen: Soft, nontender,  no hepatosplenomegaly, no distention.  Skin: Intact without lesions or rashes.  Neurologic: Alert and oriented x 3.  Psych: Normal affect. Extremities: No clubbing or cyanosis.   Assessment/Plan: 1. CAD: Stable with no ischemic symptoms.  EF preserved on echo in 5/14.  Dyspnea resolved off Brilinta.   - Continue Effient until he runs out of current bottle, will switch to Plavix at that point for long-term therapy given history of stent thrombosis. - Continue ASA 81, Imdur, ARB, statin.  - No beta blocker with bradycardia while on low dose metoprolol. 2. Palpitations: PVCs and PACs on holter in 7/14.  These seem to have recovered.  3. HTN: BP controlled.  4. Hyperlipidemia: Good lipids in 11/14.  5. I will try to arrange for PCP followup.     Larey Dresser 10/13/2013

## 2013-10-22 ENCOUNTER — Ambulatory Visit: Payer: Self-pay | Admitting: Physician Assistant

## 2013-10-23 ENCOUNTER — Ambulatory Visit (INDEPENDENT_AMBULATORY_CARE_PROVIDER_SITE_OTHER): Payer: Commercial Managed Care - HMO

## 2013-10-23 ENCOUNTER — Ambulatory Visit (INDEPENDENT_AMBULATORY_CARE_PROVIDER_SITE_OTHER): Payer: Commercial Managed Care - HMO | Admitting: Physician Assistant

## 2013-10-23 ENCOUNTER — Encounter: Payer: Self-pay | Admitting: Physician Assistant

## 2013-10-23 VITALS — BP 144/73 | HR 62 | Temp 98.1°F | Ht 70.0 in | Wt 254.8 lb

## 2013-10-23 DIAGNOSIS — R05 Cough: Secondary | ICD-10-CM

## 2013-10-23 DIAGNOSIS — R059 Cough, unspecified: Secondary | ICD-10-CM

## 2013-10-23 DIAGNOSIS — R109 Unspecified abdominal pain: Secondary | ICD-10-CM

## 2013-10-23 DIAGNOSIS — I2510 Atherosclerotic heart disease of native coronary artery without angina pectoris: Secondary | ICD-10-CM

## 2013-10-23 DIAGNOSIS — E785 Hyperlipidemia, unspecified: Secondary | ICD-10-CM

## 2013-10-23 DIAGNOSIS — R1032 Left lower quadrant pain: Secondary | ICD-10-CM

## 2013-10-23 NOTE — Patient Instructions (Signed)
healthyCardiac Diet A cardiac diet can help stop heart disease or a stroke from happening. It involves eating less unhealthy fats and eating more healthy fats.  FOODS TO AVOID OR LIMIT  Limit saturated fats. This type of fat is found in oils and dairy products, such as:  Coconut oil.  Palm oil.  Cocoa butter.  Butter.  Avoid trans-fat or hydrogenated oils. These are found in fried or pre-made baked goods, such as:  Margarine.  Pre-made cookies, cakes, and crackers.  Limit processed meats (hot dogs, deli meats, sausage) to 3 ounces a week.  Limit high-fat meats (marbled meats, fried chicken, or chicken with skin) to 3 ounces a week.  Limit salt (sodium) to 1500 milligrams a day.   Limit sweets and drinks with added sugar to no more than 5 servings a week. One serving is:  1 tablespoon of sugar.  1 tablespoon of jelly or jam.   cup sorbet.  1 cup lemonade.   cup regular soda. EAT MORE OF THE FOLLOWING FOODS Fruit  Eat 4to 5 servings a day. One serving of fruit is:  1 medium whole fruit.   cup dried fruit.   cup of fresh, frozen, or canned fruit.   cup 100% fruit juice. Vegetables  Eat 4 to 5 servings a day. One serving is:  1 cup raw leafy vegetables.   cup raw or cooked, cut-up vegetables.   cup vegetable juice. Whole Grains  Eat 3 servings a day (1 ounce equals 1 serving). Legumes (such as beans, peas, and lentils)   Eat at least 4 servings a week ( cup equals 1 serving). Nuts and Seeds   Eat at least 4 servings a week ( cup equals 1 serving). Dietary Fiber  Eat 20 to 30 grams a day. Some foods high in dietary fiber include:  Dried beans.  Citrus fruits.  Apples, bananas.  Broccoli, Brussels sprouts, and eggplant.  Oats. Omega-3 Fats  Eat food with omega-3 fats. You can also take a dietary pill (supplement) that has 1 gram of DHA and EPA. Have 3.5 ounces of fatty fish a week, such as:  Salmon.  Mackerel.  Albacore  tuna.  Sardines.  Lake trout.  Herring. PREPARING YOUR FOOD  Broil, bake, steam, or roast foods. Do not fry food. Do not cook food in butter (fat).  Use non-stick cooking sprays.  Remove skin from poultry, such as chicken and [place].  Remove fat from meat.  Take the fat off the top of stews, soups, and gravy.  Use lemon or herbs to flavor food instead of using butter or margarine.  Use nonfat yogurt, salsa, or low-fat dressings for salads. Document Released: 11/15/2011 Document Reviewed: 11/15/2011 [facility] Patient Information 2014 [place], [place].

## 2013-10-23 NOTE — Progress Notes (Signed)
Subjective:     Patient ID: Hayden Rivera, male   DOB: 08/13/1950, 63 y.o.   MRN: [id number]  HPI Pt here to establish care He has significant hx of CAD an d MI and has regular f/u with his Card They recommended he have a family MD to take care of his other issues  Review of Systems  Constitutional: Negative for activity change, appetite change and unexpected weight change.  HENT: Positive for congestion, postnasal drip and voice change. Negative for sore throat.   Cardiovascular: Positive for palpitations. Negative for chest pain and leg swelling.  Gastrointestinal: Negative for nausea, abdominal pain, diarrhea, constipation and abdominal distention.  Endocrine: Negative.   Genitourinary: Negative for dysuria, urgency, frequency, flank pain, decreased urine volume, scrotal swelling, enuresis, difficulty urinating and testicular pain.       Nocturia x 2  Musculoskeletal: Positive for arthralgias and back pain. Negative for joint swelling, myalgias, neck pain and neck stiffness.  Skin: Negative.   Allergic/Immunologic: Positive for environmental allergies.  Neurological: Negative.   Hematological: Negative.   Psychiatric/Behavioral: Negative.        Objective:   Physical Exam  Constitutional: He is oriented to person, place, and time. He appears well-developed and well-nourished.  HENT:  Head: Normocephalic and atraumatic.  Right Ear: External ear normal.  Left Ear: External ear normal.  Mouth/Throat: Oropharynx is clear and moist.  Eyes: Conjunctivae and EOM are normal. Pupils are equal, round, and reactive to light.  Neck: Normal range of motion. Neck supple. No JVD present. No thyromegaly present.  Cardiovascular: Normal rate, regular rhythm and intact distal pulses.   Murmur heard. Soft sys murmur 2/6  Pulmonary/Chest: Effort normal and breath sounds normal. He has no wheezes. He has no rales. He exhibits no tenderness.  Abdominal: Soft. Bowel sounds are normal. He exhibits  no distension and no mass. There is tenderness. There is no guarding.  TTP LLQ Sx worse with stressing of abd and L hip flexor  Genitourinary: Penis normal.  Testes down No hernia  Musculoskeletal: Normal range of motion. He exhibits no edema and no tenderness.  Lymphadenopathy:    He has no cervical adenopathy.  Neurological: He is alert and oriented to person, place, and time. He has normal reflexes. No cranial nerve deficit.  Skin: Skin is warm and dry.  CXR- pending BMP and PSA pending     Assessment:     CAD Hyperlipid Cough LLQ abd pain    Plan:     Discussed with pt LLQ pain seems muscular in nature I would like him to increase exercise as recommended by Card Good healthy diet Labs done today not recently done at Card Pt due for colonoscopy but he sates his Card would like him to wait for 3 months until off med Will set this up at that time Will inform of lab results F/U pending labs

## 2013-10-24 LAB — CMP14+EGFR
A/G RATIO: 1.6 (ref 1.1–2.5)
ALBUMIN: 4.6 g/dL (ref 3.6–4.8)
ALK PHOS: 99 IU/L (ref 39–117)
ALT: 47 IU/L — ABNORMAL HIGH (ref 0–44)
AST: 47 IU/L — ABNORMAL HIGH (ref 0–40)
BILIRUBIN TOTAL: 0.6 mg/dL (ref 0.0–1.2)
BUN / CREAT RATIO: 15 (ref 10–22)
BUN: 15 mg/dL (ref 8–27)
CO2: 22 mmol/L (ref 18–29)
CREATININE: 1.01 mg/dL (ref 0.76–1.27)
Calcium: 9.7 mg/dL (ref 8.6–10.2)
Chloride: 105 mmol/L (ref 97–108)
GFR, EST AFRICAN AMERICAN: 91 mL/min/{1.73_m2} (ref >59)
GFR, EST NON AFRICAN AMERICAN: 79 mL/min/{1.73_m2} (ref >59)
GLOBULIN, TOTAL: 2.8 g/dL (ref 1.5–4.5)
GLUCOSE: 95 mg/dL (ref 65–99)
Potassium: 4.1 mmol/L (ref 3.5–5.2)
Sodium: 143 mmol/L (ref 134–144)
TOTAL PROTEIN: 7.4 g/dL (ref 6.0–8.5)

## 2013-10-24 LAB — PSA, TOTAL AND FREE
PSA, Free Pct: 55 %
PSA, Free: 0.11 ng/mL
PSA: 0.2 ng/mL (ref 0.0–4.0)

## 2013-10-25 ENCOUNTER — Telehealth: Payer: Self-pay | Admitting: Physician Assistant

## 2013-10-25 NOTE — Telephone Encounter (Signed)
Please review for patient

## 2013-12-31 ENCOUNTER — Other Ambulatory Visit: Payer: Self-pay

## 2013-12-31 MED ORDER — CLOPIDOGREL BISULFATE 75 MG PO TABS: 75.0000 mg | ORAL_TABLET | Freq: Every day | ORAL | Status: DC

## 2014-02-06 ENCOUNTER — Other Ambulatory Visit: Payer: Self-pay | Admitting: Cardiology

## 2014-03-03 ENCOUNTER — Encounter: Payer: Self-pay | Admitting: Cardiology

## 2014-03-03 ENCOUNTER — Ambulatory Visit (INDEPENDENT_AMBULATORY_CARE_PROVIDER_SITE_OTHER): Payer: Commercial Managed Care - HMO | Admitting: Cardiology

## 2014-03-03 ENCOUNTER — Other Ambulatory Visit (INDEPENDENT_AMBULATORY_CARE_PROVIDER_SITE_OTHER): Payer: Commercial Managed Care - HMO | Admitting: *Deleted

## 2014-03-03 VITALS — BP 124/70 | HR 56 | Ht 70.0 in | Wt 250.0 lb

## 2014-03-03 DIAGNOSIS — E785 Hyperlipidemia, unspecified: Secondary | ICD-10-CM

## 2014-03-03 DIAGNOSIS — K219 Gastro-esophageal reflux disease without esophagitis: Secondary | ICD-10-CM | POA: Insufficient documentation

## 2014-03-03 DIAGNOSIS — I2510 Atherosclerotic heart disease of native coronary artery without angina pectoris: Secondary | ICD-10-CM

## 2014-03-03 LAB — LIPID PANEL
Cholesterol: 132 mg/dL (ref 0–200)
HDL: 42.9 mg/dL (ref >39.00)
LDL CALC: 59 mg/dL (ref 0–99)
NonHDL: 89.1
Total CHOL/HDL Ratio: 3
Triglycerides: 149 mg/dL (ref 0.0–149.0)
VLDL: 29.8 mg/dL (ref 0.0–40.0)

## 2014-03-03 NOTE — Patient Instructions (Signed)
Your physician wants you to follow-up in:6 months with Dr. McLean.  You will receive a reminder letter in the mail two months in advance. If you don't receive a letter, please call our office to schedule the follow-up appointment.   

## 2014-03-03 NOTE — Progress Notes (Signed)
Patient ID: Nery Frappier, male   DOB: 08-30-1950, 63 y.o.   MRN: [id number]    03/03/2014 Olusegun Gerstenberger   1950-12-15  [id number]  Primary Physicia Redge Gainer, MD Primary Cardiologist: Dr. Aundra Dubin   HPI: The patient is a 63 year old male, followed by Dr. Aundra Dubin, who presents to clinic today for routine 6 month followup. His last office visit with Dr. Aundra Dubin was in May 2015. He has a history of known CAD: Anterior STEMI in 5329 complicated by ventricular fibrillation arrest. PCI to LAD at [facility]. Stent thrombosis in 2006, had DES to LAD in [place]. 5/14 had balloon angioplasty to mid LAD (in-stent restenosis) and DES to LCx for unstable angina. Echo (5/14) with EF 60%, no regional wall motion abnormalities, aortic sclerosis without stenosis. He also has a history of treated hypertension and hyperlipidemia. He remains on dual antiplatelet therapy with aspirin and Plavix. Beta blocker therapy was discontinued in the past subsequent to sinus bradycardia. He takes Crestor for cholesterol. He is on an ARB for blood pressure control.  Today in clinic, he denies any recent history of angina. He performs yard work on a routine basis including mowing and trimming bushes and trees without exertional dyspnea. Occasionally, he feels a bit winded whenever he walks his dogs with moderate to high intensity speed, however denies any anginal like symptoms. No symptoms at rest. He denies orthopnea, PND and lower extremity edema. No dizziness, lightheadedness, syncope/near syncope. He denies any tobacco use. He reports full medication compliance.  EKG today demonstrates sinus bradycardia with heart rate of 56 beats per minute. Compared to prior EKG in 09/2013, there are no changes. His blood pressure is well controlled at 124/70.    Current Outpatient Prescriptions  Medication Sig Dispense Refill  . aspirin 81 MG tablet Take 1 tablet (81 mg total) by mouth daily.      . cholecalciferol (VITAMIN D) 1000 UNITS  tablet Vitamin d3 2000 IU DAILY      . clopidogrel (PLAVIX) 75 MG tablet Take 1 tablet (75 mg total) by mouth daily.  90 tablet  3  . isosorbide mononitrate (IMDUR) 30 MG 24 hr tablet TAKE 1 TABLET ONE TIME DAILY  ( NEED MD APPOINTMENT  )  90 tablet  0  . losartan (COZAAR) 50 MG tablet Take 1 tablet (50 mg total) by mouth daily.  90 tablet  3  . Misc Natural Products (OSTEO BI-FLEX TRIPLE STRENGTH PO) Take 1 tablet by mouth 2 (two) times daily.      . nitroGLYCERIN (NITROSTAT) 0.4 MG SL tablet Place 1 tablet (0.4 mg total) under the tongue every 5 (five) minutes as needed for chest pain (up to 3 doses).  25 tablet  4  . Omega-3 Fatty Acids (FISH OIL) 1200 MG CAPS Take 1 capsule by mouth 2 (two) times daily.      . rosuvastatin (CRESTOR) 10 MG tablet Take 1 tablet (10 mg total) by mouth daily.  90 tablet  0   No current facility-administered medications for this visit.    Allergies  Allergen Reactions  . Altace [Ramipril]     cough  . Penicillins Swelling    History   Social History  . Marital Status: Married    Spouse Name: N/A    Number of Children: N/A  . Years of Education: N/A   Occupational History  . retired    Social History Main Topics  . Smoking status: Former Smoker -- 0.50 packs/day for 10 years  Types: Cigars, Cigarettes    Quit date: 05/30/2002  . Smokeless tobacco: Not on file  . Alcohol Use: No  . Drug Use: No  . Sexual Activity: Not Currently   Other Topics Concern  . Not on file   Social History Narrative  . No narrative on file     Review of Systems: General: negative for chills, fever, night sweats or weight changes.  Cardiovascular: negative for chest pain, dyspnea on exertion, edema, orthopnea, palpitations, paroxysmal nocturnal dyspnea or shortness of breath Dermatological: negative for rash Respiratory: negative for cough or wheezing Urologic: negative for hematuria Abdominal: negative for nausea, vomiting, diarrhea, bright red blood per  rectum, melena, or hematemesis Neurologic: negative for visual changes, syncope, or dizziness All other systems reviewed and are otherwise negative except as noted above.    Blood pressure 124/70, pulse 56, height 5\' 10"  (1.778 m), weight 250 lb (113.399 kg).  General appearance: alert, cooperative and no distress Neck: no carotid bruit and no JVD Lungs: clear to auscultation bilaterally Heart: regular rate and rhythm, S1, S2 normal, no murmur, click, rub or gallop Extremities: no LEE Pulses: 2+ and symmetric Skin: warm and dry Neurologic: Grossly normal  EKG Sinus bradycardia. HR 56 bpm. No significant changes compare to prior EKG 5/15.  ASSESSMENT AND PLAN:   1. CAD: No anginal symptoms. EKG compared to prior EKG is unchanged. Blood pressure and heart rate were well controlled. Continue medical therapy with aspirin, Plavix, losartan and Crestor. He is not a candidate for beta blocker therapy due to sinus bradycardia.  2. Hypertension: Controlled at 124/70. Continue current dose of losartan.  3. Hyperlipidemia: Last lipid panel 10 months ago revealed total cholesterol less than 200. LDL was at goal of less than 70 and 60 mg/dL. The patient just had labs drawn for repeat lipid panel. Results pending. For now, continue current dose of Crestor. We'll make medication adjustments if fasting lipid panel demonstrates any negative change in cholesterol levels.   PLAN  The patient appears stable from a cardiac standpoint with no recurrent anginal symptoms, nonischemic EKG, and stable heart rate and blood pressure. Recommend continuation of medical therapy. Followup with Dr. Aundra Dubin in 6 months.  Hester Forget, BRITTAINYPA-C 03/03/2014 11:05 AM

## 2014-05-08 ENCOUNTER — Encounter (HOSPITAL_COMMUNITY): Payer: Self-pay | Admitting: Cardiovascular Disease

## 2014-05-20 ENCOUNTER — Other Ambulatory Visit: Payer: Self-pay | Admitting: Cardiology

## 2014-07-08 ENCOUNTER — Encounter: Payer: Self-pay | Admitting: Family Medicine

## 2014-07-08 ENCOUNTER — Ambulatory Visit (INDEPENDENT_AMBULATORY_CARE_PROVIDER_SITE_OTHER): Payer: Commercial Managed Care - HMO | Admitting: Family Medicine

## 2014-07-08 VITALS — BP 136/77 | HR 62 | Temp 96.6°F | Ht 70.0 in | Wt 259.2 lb

## 2014-07-08 DIAGNOSIS — R3915 Urgency of urination: Secondary | ICD-10-CM | POA: Diagnosis not present

## 2014-07-08 DIAGNOSIS — M1711 Unilateral primary osteoarthritis, right knee: Secondary | ICD-10-CM

## 2014-07-08 DIAGNOSIS — N200 Calculus of kidney: Secondary | ICD-10-CM

## 2014-07-08 DIAGNOSIS — N210 Calculus in bladder: Secondary | ICD-10-CM | POA: Diagnosis not present

## 2014-07-08 DIAGNOSIS — R339 Retention of urine, unspecified: Secondary | ICD-10-CM | POA: Diagnosis not present

## 2014-07-08 DIAGNOSIS — R312 Other microscopic hematuria: Secondary | ICD-10-CM | POA: Diagnosis not present

## 2014-07-08 DIAGNOSIS — N5201 Erectile dysfunction due to arterial insufficiency: Secondary | ICD-10-CM | POA: Diagnosis not present

## 2014-07-08 LAB — POCT URINALYSIS DIPSTICK
Bilirubin, UA: NEGATIVE
Glucose, UA: NEGATIVE
Ketones, UA: NEGATIVE
Leukocytes, UA: NEGATIVE
Nitrite, UA: NEGATIVE
Spec Grav, UA: 1.02
Urobilinogen, UA: NEGATIVE
pH, UA: 5

## 2014-07-08 LAB — POCT UA - MICROSCOPIC ONLY
Bacteria, U Microscopic: NEGATIVE
Casts, Ur, LPF, POC: NEGATIVE
Crystals, Ur, HPF, POC: NEGATIVE
WBC, Ur, HPF, POC: NEGATIVE
Yeast, UA: NEGATIVE

## 2014-07-08 MED ORDER — NAPROXEN 500 MG PO TABS: 500.0000 mg | ORAL_TABLET | Freq: Two times a day (BID) | ORAL | Status: DC

## 2014-07-08 MED ORDER — HYDROCODONE-ACETAMINOPHEN 5-325 MG PO TABS: 1.0000 | ORAL_TABLET | Freq: Four times a day (QID) | ORAL | Status: DC | PRN

## 2014-07-08 NOTE — Progress Notes (Signed)
   Subjective:    Patient ID: Hayden Rivera, male    DOB: 1951/01/21, 64 y.o.   MRN: [id number]  HPI Patient c/o colicky back pain and difficulty urinating.  He states his urine stream becomes diminished and he develops back pain.  He states this has been ongoing for a month.  He c/o chronic right knee pain and was told by the [facility] that he has DJD of the right knee and needs to see orthopedics.  Review of Systems  Constitutional: Negative for fever.  HENT: Negative for ear pain.   Eyes: Negative for discharge.  Respiratory: Negative for cough.   Cardiovascular: Negative for chest pain.  Gastrointestinal: Negative for abdominal distention.  Endocrine: Negative for polyuria.  Genitourinary: Negative for difficulty urinating.  Musculoskeletal: Negative for gait problem and neck pain.  Skin: Negative for color change and rash.  Neurological: Negative for speech difficulty and headaches.  Psychiatric/Behavioral: Negative for agitation.       Objective:    BP 136/77 mmHg  Pulse 62  Temp(Src) 96.6 F (35.9 C) (Oral)  Ht 5\' 10"  (1.778 m)  Wt 259 lb 3.2 oz (117.572 kg)  BMI 37.19 kg/m2 Physical Exam  Constitutional: He is oriented to person, place, and time. He appears well-developed and well-nourished.  HENT:  Head: Normocephalic and atraumatic.  Mouth/Throat: Oropharynx is clear and moist.  Eyes: Pupils are equal, round, and reactive to light.  Neck: Normal range of motion. Neck supple.  Cardiovascular: Normal rate and regular rhythm.   No murmur heard. Pulmonary/Chest: Effort normal and breath sounds normal.  Abdominal: Soft. Bowel sounds are normal. There is no tenderness.  Neurological: He is alert and oriented to person, place, and time.  Skin: Skin is warm and dry.  Psychiatric: He has a normal mood and affect.          Assessment & Plan:     ICD-9-CM ICD-10-CM   1. Urinary retention 788.20 R33.9 POCT UA - Microscopic Only     POCT urinalysis dipstick   naproxen (NAPROSYN) 500 MG tablet     HYDROcodone-acetaminophen (NORCO) 5-325 MG per tablet     Ambulatory referral to Urology  2. Nephrolithiasis 592.0 N20.0 naproxen (NAPROSYN) 500 MG tablet     HYDROcodone-acetaminophen (NORCO) 5-325 MG per tablet     Ambulatory referral to Urology  3. Primary osteoarthritis of right knee 715.16 M17.11 naproxen (NAPROSYN) 500 MG tablet     HYDROcodone-acetaminophen (NORCO) 5-325 MG per tablet     Ambulatory referral to Urology     Ambulatory referral to Orthopedic Surgery     No Follow-up on file.  Lysbeth Penner FNP

## 2014-07-08 NOTE — Addendum Note (Signed)
Addended by: Jamelle Haring on: 07/08/2014 10:47 AM   Modules accepted: Miquel Dunn

## 2014-07-16 DIAGNOSIS — M1711 Unilateral primary osteoarthritis, right knee: Secondary | ICD-10-CM | POA: Diagnosis not present

## 2014-07-28 DIAGNOSIS — M1711 Unilateral primary osteoarthritis, right knee: Secondary | ICD-10-CM | POA: Diagnosis not present

## 2014-07-29 ENCOUNTER — Other Ambulatory Visit: Payer: Self-pay | Admitting: Cardiology

## 2014-08-05 DIAGNOSIS — M25561 Pain in right knee: Secondary | ICD-10-CM | POA: Diagnosis not present

## 2014-08-13 DIAGNOSIS — M25561 Pain in right knee: Secondary | ICD-10-CM | POA: Diagnosis not present

## 2014-09-02 ENCOUNTER — Telehealth: Payer: Self-pay | Admitting: Cardiology

## 2014-09-02 NOTE — Telephone Encounter (Signed)
I do not have a surgical clearance form  for Dr Aundra Dubin to complete.  I will forward to HIM to see if they have a completed form on file from 08/15/14.  Kim--would you see if you have this surgical clearance on file? Thanks

## 2014-09-02 NOTE — Telephone Encounter (Signed)
New problem   Want to know status of medical clearance that was fax on 3.18.16 for pt to have knee scope. Please advise.

## 2014-09-03 NOTE — Telephone Encounter (Signed)
Hayden Rivera,  I do Not have a Surgical Clearance for this pt.

## 2014-09-08 NOTE — Telephone Encounter (Signed)
Surgical clearance request has been completed by Dr Aundra Dubin and returned to HIM.

## 2014-09-10 ENCOUNTER — Ambulatory Visit (INDEPENDENT_AMBULATORY_CARE_PROVIDER_SITE_OTHER): Payer: Commercial Managed Care - HMO | Admitting: Cardiology

## 2014-09-10 ENCOUNTER — Encounter: Payer: Self-pay | Admitting: Cardiology

## 2014-09-10 VITALS — BP 136/76 | HR 74 | Ht 71.0 in | Wt 259.0 lb

## 2014-09-10 DIAGNOSIS — R0600 Dyspnea, unspecified: Secondary | ICD-10-CM | POA: Insufficient documentation

## 2014-09-10 DIAGNOSIS — I10 Essential (primary) hypertension: Secondary | ICD-10-CM

## 2014-09-10 DIAGNOSIS — R0609 Other forms of dyspnea: Secondary | ICD-10-CM | POA: Diagnosis not present

## 2014-09-10 DIAGNOSIS — E785 Hyperlipidemia, unspecified: Secondary | ICD-10-CM

## 2014-09-10 DIAGNOSIS — E669 Obesity, unspecified: Secondary | ICD-10-CM

## 2014-09-10 NOTE — Assessment & Plan Note (Signed)
STEMI in '05 Rx'd with LAD stent with ISR of LAD - PCI '06, and 2014 CFX DES 2014

## 2014-09-10 NOTE — Assessment & Plan Note (Signed)
This is a new problem for him, onset in the past few weeks after a URI

## 2014-09-10 NOTE — Assessment & Plan Note (Signed)
He needs Rt knee arthroscopic surgery

## 2014-09-10 NOTE — Assessment & Plan Note (Signed)
Treated

## 2014-09-10 NOTE — Assessment & Plan Note (Signed)
Treated LDL 59 in Oct 2015

## 2014-09-10 NOTE — Patient Instructions (Signed)
Medication Instructions:   Your physician recommends that you continue on your current medications as directed. Please refer to the Current Medication list given to you today.   Labwork: NONE AT THIS TIME   Testing/Procedures:  Your physician has requested that you have an echocardiogram. Echocardiography is a painless test that uses sound waves to create images of your heart. It provides your doctor with information about the size and shape of your heart and how well your heart's chambers and valves are working. This procedure takes approximately one hour. There are no restrictions for this procedure.  Your physician has requested that you have a lexiscan myoview. For further information please visit [email or web address]. Please follow instruction sheet, as given.   Follow-Up:  YOU HAVE A 6 MONTH FOLLOW SET UP FOR YOU TO CALL OFFICE BACK ONCE YOU RECEIVE A LETTER THROUGH THE MAIL   Any Other Special Instructions Will Be Listed Below (If Applicable).

## 2014-09-10 NOTE — Progress Notes (Signed)
09/10/2014 Hayden Rivera   [birth year]  [id number]  Primary Physician Redge Gainer, MD Primary Cardiologist: Dr Aundra Dubin  HPI:  64 year old male, followed by Dr. Aundra Dubin, who presents to clinic today for routine 6 month follow up as well as pre op clearance for arthroscopic knee surgery. He has a history of known CAD: s/p anterior STEMI in 3354 complicated by ventricular fibrillation arrest. He underwent PCI to LAD at [facility]. He had in stent thrombosis in 2006, Rx'd with DES to LAD in [place]. In May 2014 he had balloon angioplasty to mid LAD (in-stent restenosis) and DES to LCx for unstable angina. Echo (5/14) with EF 60%, no regional wall motion abnormalities, aortic sclerosis without stenosis. He also has a history of treated hypertension, obesity, and hyperlipidemia. He remains on dual antiplatelet therapy with aspirin and Plavix. Beta blocker therapy was discontinued in the past subsequent to sinus bradycardia. He takes Crestor for cholesterol. He is on an ARB for blood pressure control.         He needs to have Rt knee arthroscopic surgery and came today really for pre op clearance. He denies chest pain but does complain of new DOE for the past few weeks. He says it started after a URI,  "the flu" a few weeks ago. He denies any ankle edema but does say he feels like he has to sit up to sleep at times. He notes increased DOE overall. He denies chest pain but has had an increase in his palpitations.    Current Outpatient Prescriptions  Medication Sig Dispense Refill  . acetaminophen (TYLENOL) 500 MG tablet Take 500 mg by mouth as needed.    Marland Kitchen aspirin 81 MG tablet Take 1 tablet (81 mg total) by mouth daily.    . cholecalciferol (VITAMIN D) 1000 UNITS tablet Vitamin d3 2000 IU DAILY    . clopidogrel (PLAVIX) 75 MG tablet Take 1 tablet (75 mg total) by mouth daily. 90 tablet 3  . HYDROcodone-acetaminophen (NORCO) 5-325 MG per tablet Take 1 tablet by mouth every 6 (six) hours as needed for  moderate pain. 30 tablet 0  . isosorbide mononitrate (IMDUR) 30 MG 24 hr tablet Take 1 tablet (30 mg total) by mouth daily. 90 tablet 1  . losartan (COZAAR) 50 MG tablet TAKE 1 TABLET BY MOUTH DAILY. 90 tablet 0  . Misc Natural Products (OSTEO BI-FLEX TRIPLE STRENGTH PO) Take 1 tablet by mouth 2 (two) times daily.    . naproxen (NAPROSYN) 500 MG tablet Take 1 tablet (500 mg total) by mouth 2 (two) times daily with a meal. 30 tablet 0  . nitroGLYCERIN (NITROSTAT) 0.4 MG SL tablet Place 1 tablet (0.4 mg total) under the tongue every 5 (five) minutes as needed for chest pain (up to 3 doses). 25 tablet 4  . Omega-3 Fatty Acids (FISH OIL) 1200 MG CAPS Take 1 capsule by mouth 2 (two) times daily.    Marland Kitchen OVER THE COUNTER MEDICATION as needed. COUGH SUPPRESSANT.Marland KitchenMarland KitchenTake as directed on bottle    . rosuvastatin (CRESTOR) 10 MG tablet Take 1 tablet (10 mg total) by mouth daily. 90 tablet 0   No current facility-administered medications for this visit.    Allergies  Allergen Reactions  . Altace [Ramipril] Shortness Of Breath and Other (See Comments)    cough  . Penicillins Swelling    History   Social History  . Marital Status: Married    Spouse Name: N/A  . Number of Children: N/A  . Years  of Education: N/A   Occupational History  . retired    Social History Main Topics  . Smoking status: Former Smoker -- 0.50 packs/day for 10 years    Types: Cigars, Cigarettes    Quit date: 05/30/2002  . Smokeless tobacco: Not on file  . Alcohol Use: No  . Drug Use: No  . Sexual Activity: Not Currently   Other Topics Concern  . Not on file   Social History Narrative     Review of Systems: General: negative for chills, fever, night sweats or weight changes.  Cardiovascular: negative for chest pain, dyspnea on exertion, edema, orthopnea, palpitations, paroxysmal nocturnal dyspnea or shortness of breath Dermatological: negative for rash Respiratory: negative for cough or wheezing Urologic: negative  for hematuria Abdominal: negative for nausea, vomiting, diarrhea, bright red blood per rectum, melena, or hematemesis Neurologic: negative for visual changes, syncope, or dizziness All other systems reviewed and are otherwise negative except as noted above.    Blood pressure 136/76, pulse 74, height 5\' 11"  (1.803 m), weight 259 lb (117.482 kg), SpO2 94 %.  General appearance: alert, cooperative, no distress and moderately obese Neck: no carotid bruit and no JVD Lungs: clear to auscultation bilaterally Heart: regular rate and rhythm and 2/6 systolic murmur LSB Abdomen: obese Extremities: no edema Skin: Skin color, texture, turgor normal. No rashes or lesions Neurologic: Grossly normal   ASSESSMENT AND PLAN:   Dyspnea on exertion This is a new problem for him, onset in the past few weeks after a URI   CAD S/P percutaneous coronary angioplasty/DES STEMI in '05 Rx'd with LAD stent with ISR of LAD - PCI '06, and 2014 CFX DES 2014   Hypertension Treated   Hyperlipidemia Treated LDL 59 in Oct 2015   Arthritis, degenerative He needs Rt knee arthroscopic surgery    PLAN  I have ordered an echo and Lexiscan Myoview on Mr Blackard. If the above tests are normal he should be OK for arthroscopic knee surgery. He would most likely have to hold his ASA and Plavix for this and this should be resumed ASAP after surgery.   Iris Hairston KPA-C 09/10/2014 11:35 AM

## 2014-09-11 ENCOUNTER — Encounter: Payer: Self-pay | Admitting: Cardiovascular Disease

## 2014-09-11 NOTE — Telephone Encounter (Signed)
This encounter was created in error - please disregard.

## 2014-09-11 NOTE — Telephone Encounter (Signed)
Follow up  Pt returning Lauren's phone call. Please call back and discuss.

## 2014-09-16 ENCOUNTER — Ambulatory Visit: Payer: Commercial Managed Care - HMO | Admitting: Cardiology

## 2014-09-16 ENCOUNTER — Other Ambulatory Visit: Payer: Self-pay

## 2014-09-16 ENCOUNTER — Ambulatory Visit (HOSPITAL_BASED_OUTPATIENT_CLINIC_OR_DEPARTMENT_OTHER): Payer: Commercial Managed Care - HMO | Admitting: Radiology

## 2014-09-16 ENCOUNTER — Ambulatory Visit (HOSPITAL_COMMUNITY): Payer: Commercial Managed Care - HMO | Attending: Cardiology | Admitting: Radiology

## 2014-09-16 DIAGNOSIS — I10 Essential (primary) hypertension: Secondary | ICD-10-CM | POA: Diagnosis not present

## 2014-09-16 DIAGNOSIS — R0609 Other forms of dyspnea: Secondary | ICD-10-CM | POA: Diagnosis not present

## 2014-09-16 DIAGNOSIS — E785 Hyperlipidemia, unspecified: Secondary | ICD-10-CM | POA: Diagnosis not present

## 2014-09-16 MED ORDER — TECHNETIUM TC 99M SESTAMIBI GENERIC - CARDIOLITE
33.0000 | Freq: Once | INTRAVENOUS | Status: AC | PRN
  Administered 2014-09-16: 33 via INTRAVENOUS

## 2014-09-16 MED ORDER — REGADENOSON 0.4 MG/5ML IV SOLN
0.4000 mg | Freq: Once | INTRAVENOUS | Status: AC
  Administered 2014-09-16: 0.4 mg via INTRAVENOUS

## 2014-09-16 MED ORDER — TECHNETIUM TC 99M SESTAMIBI GENERIC - CARDIOLITE
11.0000 | Freq: Once | INTRAVENOUS | Status: AC | PRN
  Administered 2014-09-16: 11 via INTRAVENOUS

## 2014-09-16 NOTE — Progress Notes (Signed)
Echocardiogram performed.  

## 2014-09-16 NOTE — Progress Notes (Addendum)
[facility] [address] [place], [place] [address] [phone]    Cardiology Nuclear Med Study  Jakai Risse is a 64 y.o. male     MRN : [id number]     DOB: 1950-06-27  Procedure Date: 09/16/2014  Nuclear Med Background Indication for Stress Test:  Evaluation for Ischemia and Surgical Clearance:Right Knee Surgery with Dr. Rhona Raider History:  CAD-Stent, MPI 10 yrs ago, STEMI with VFIB arrest Cardiac Risk Factors: Hypertension and Lipids  Symptoms:  DOE   Nuclear Pre-Procedure Caffeine/Decaff Intake:  8:00pm NPO After: 8:30am   Lungs:  clear O2 Sat: 95% on room air. IV 0.9% NS with Angio Cath:  22g  IV Site: R Hand  IV Started by:  Matilde Haymaker, RN  Chest Size (in):  50 Cup Size: n/a  Height: 5\' 11"  (1.803 m)  Weight:  252 lb (114.306 kg)  BMI:  Body mass index is 35.16 kg/(m^2). Tech Comments:  n/a    Nuclear Med Study 1 or 2 day study: 1 day  Stress Test Type:  Lexiscan  Reading MD: n/a  Order Authorizing Provider:  Theador Hawthorne and Rober Minion  Resting Radionuclide: Technetium 39m Sestamibi  Resting Radionuclide Dose: 11.0 mCi   Stress Radionuclide:  Technetium 15m Sestamibi  Stress Radionuclide Dose: 33.0 mCi           Stress Protocol Rest HR: 59 Stress HR: 72  Rest BP: 140/88 Stress BP: 160/90  Exercise Time (min): n/a METS: n/a   Predicted Max HR: 156 bpm % Max HR: 46.15 bpm Rate Pressure Product: 11520   Dose of Adenosine (mg):  n/a Dose of Lexiscan: 0.4 mg  Dose of Atropine (mg): n/a Dose of Dobutamine: n/a mcg/kg/min (at max HR)  Stress Test Technologist: Perrin Maltese, EMT-P  Nuclear Technologist:  Earl Many, CNMT     Rest Procedure:  Myocardial perfusion imaging was performed at rest 45 minutes following the intravenous administration of Technetium 77m Sestamibi. Rest ECG: SR, anteroseptal MI, age undetermined.  Stress Procedure:  The patient received IV Lexiscan 0.4 mg over 15-seconds.   Technetium 74m Sestamibi injected at 30-seconds. The patient was sob and felt weird with the Lexiscan injection. Quantitative spect images were obtained after a 45 minute delay. Stress ECG: No significant change from baseline ECG  QPS Raw Data Images:  Normal; no motion artifact; normal heart/lung ratio. Stress Images:  There is decreased uptake in the apical anterior, apex, basal and mid inferoseptal, inferior, inferolateral and basal anterolateral walls.  Rest Images:  There is decreased uptake in the apical anterior, apex, basal and mid inferoseptal, inferior, inferolateral walls.  Subtraction (SDS):  4 Transient Ischemic Dilatation (Normal <1.22):  1.05 Lung/Heart Ratio (Normal <0.45):  0.36  Quantitative Gated Spect Images QGS EDV:  133 ml QGS ESV:  66 ml  Impression Exercise Capacity:  Lexiscan with no exercise. BP Response:  Normal blood pressure response. Clinical Symptoms:  There is dyspnea. ECG Impression:  No significant ST segment change suggestive of ischemia. Comparison with Prior Nuclear Study: No images to compare  Overall Impression:  High risk stress nuclear study with two defects: 1. There is a small scar in the distal anterior wall and in the apex. 2. There is a large scar in the basal and mid inferoseptal, inferior and inferolateral walls with medium size, moderate severity  peri-infarct ischemia in the basal and mid inferolateral and basal anterolateral walls.   LV Ejection Fraction: 49%.  LV Wall Motion:  Paradoxical septal motion, akinesis in the basal inferior and inferoseptal walls, apical anterior walls.   Dorothy Spark 09/16/2014  Primarily scar from prior MI, some peri-infarct ischemia.  Suspect this is result of prior MI but would like to see see him back in office to discuss symptoms are scar appears rather extensive.  Can we work him in to see me on my office day next week (can squeeze him in as last patient or first patient)?  Thanks.  Loralie Champagne 09/17/2014 9:56 AM

## 2014-09-17 ENCOUNTER — Telehealth: Payer: Self-pay | Admitting: Nurse Practitioner

## 2014-09-17 NOTE — Telephone Encounter (Signed)
Tentatively have appointment "scheduled" for 4/28 at 4:30 pm. Can adjust if needed.

## 2014-09-17 NOTE — Telephone Encounter (Signed)
Spoke with patient and reviewed myocardial stress test results.  Patient verbalized understanding and states he needs to come in earlier on 4/28.  Patient scheduled for 11:15 on 4/28

## 2014-09-17 NOTE — Telephone Encounter (Signed)
Left message for patient to call office.  Per Dr. Aundra Dubin he would like to see the patient for follow-up next week to discuss myoview results

## 2014-09-19 ENCOUNTER — Other Ambulatory Visit: Payer: Self-pay | Admitting: Physician Assistant

## 2014-09-25 ENCOUNTER — Encounter: Payer: Self-pay | Admitting: *Deleted

## 2014-09-25 ENCOUNTER — Encounter: Payer: Self-pay | Admitting: Cardiology

## 2014-09-25 ENCOUNTER — Ambulatory Visit: Payer: Commercial Managed Care - HMO | Admitting: Cardiology

## 2014-09-25 ENCOUNTER — Ambulatory Visit (INDEPENDENT_AMBULATORY_CARE_PROVIDER_SITE_OTHER): Payer: Commercial Managed Care - HMO | Admitting: Cardiology

## 2014-09-25 VITALS — BP 156/92 | HR 66 | Ht 71.0 in | Wt 257.0 lb

## 2014-09-25 DIAGNOSIS — I2510 Atherosclerotic heart disease of native coronary artery without angina pectoris: Secondary | ICD-10-CM | POA: Diagnosis not present

## 2014-09-25 DIAGNOSIS — R0609 Other forms of dyspnea: Secondary | ICD-10-CM | POA: Diagnosis not present

## 2014-09-25 DIAGNOSIS — E785 Hyperlipidemia, unspecified: Secondary | ICD-10-CM | POA: Diagnosis not present

## 2014-09-25 DIAGNOSIS — I10 Essential (primary) hypertension: Secondary | ICD-10-CM | POA: Diagnosis not present

## 2014-09-25 LAB — CBC WITH DIFFERENTIAL/PLATELET
BASOS ABS: 0.1 10*3/uL (ref 0.0–0.1)
BASOS PCT: 0.7 % (ref 0.0–3.0)
EOS ABS: 0.2 10*3/uL (ref 0.0–0.7)
Eosinophils Relative: 2.3 % (ref 0.0–5.0)
HCT: 42 % (ref 39.0–52.0)
Hemoglobin: 14.4 g/dL (ref 13.0–17.0)
Lymphocytes Relative: 31.8 % (ref 12.0–46.0)
Lymphs Abs: 3.2 10*3/uL (ref 0.7–4.0)
MCHC: 34.3 g/dL (ref 30.0–36.0)
MCV: 96.6 fl (ref 78.0–100.0)
MONO ABS: 1 10*3/uL (ref 0.1–1.0)
Monocytes Relative: 10 % (ref 3.0–12.0)
NEUTROS ABS: 5.6 10*3/uL (ref 1.4–7.7)
Neutrophils Relative %: 55.2 % (ref 43.0–77.0)
Platelets: 208 10*3/uL (ref 150.0–400.0)
RBC: 4.35 Mil/uL (ref 4.22–5.81)
RDW: 13.9 % (ref 11.5–15.5)
WBC: 10.1 10*3/uL (ref 4.0–10.5)

## 2014-09-25 LAB — BASIC METABOLIC PANEL
BUN: 12 mg/dL (ref 6–23)
CALCIUM: 9.7 mg/dL (ref 8.4–10.5)
CO2: 24 meq/L (ref 19–32)
CREATININE: 0.81 mg/dL (ref 0.40–1.50)
Chloride: 108 mEq/L (ref 96–112)
GFR: 101.82 mL/min (ref >60.00)
Glucose, Bld: 104 mg/dL — ABNORMAL HIGH (ref 70–99)
Potassium: 3.8 mEq/L (ref 3.5–5.1)
SODIUM: 139 meq/L (ref 135–145)

## 2014-09-25 LAB — PROTIME-INR
INR: 1.2 ratio — AB (ref 0.8–1.0)
Prothrombin Time: 13.7 s — ABNORMAL HIGH (ref 9.6–13.1)

## 2014-09-25 MED ORDER — NITROGLYCERIN 0.4 MG SL SUBL: SUBLINGUAL_TABLET | SUBLINGUAL | Status: DC

## 2014-09-25 MED ORDER — ISOSORBIDE MONONITRATE ER 60 MG PO TB24: 60.0000 mg | ORAL_TABLET | Freq: Every day | ORAL | Status: DC

## 2014-09-25 NOTE — Patient Instructions (Addendum)
Medication Instructions:  Increase Imdur (isosorbide) to 60mg  daily.  Dr Aundra Dubin has refilled your nitroglycerin prescription.  Labwork: BMET/CBCd/PT/INR today  Testing/Procedures: Your physician has requested that you have a cardiac catheterization. Cardiac catheterization is used to diagnose and/or treat various heart conditions. Doctors may recommend this procedure for a number of different reasons. The most common reason is to evaluate chest pain. Chest pain can be a symptom of coronary artery disease (CAD), and cardiac catheterization can show whether plaque is narrowing or blocking your heart's arteries. This procedure is also used to evaluate the valves, as well as measure the blood flow and oxygen levels in different parts of your heart. For further information please visit HugeFiesta.[email or web address]. Please follow instruction sheet, as given. Thursday May 5,2016    Follow-Up: Your physician recommends that you schedule a follow-up appointment in: 3 weeks with Dr Aundra Dubin.   Your physician has requested that you regularly monitor and record your blood pressure readings at home. Please use the same machine at the same time of day to check your readings and record them. I have given you a prescription for a blood pressure cuff.

## 2014-09-25 NOTE — Progress Notes (Addendum)
Patient ID: Hayden Rivera, male   DOB: 1950-10-06, 64 y.o.   MRN: [id number] PCP: Dr Laurance Flatten  64 yo with history of CAD presents for cardiology followup.  In 5/14, patient was admitted with unstable angina.  He had angioplasty to in-stent restenosis in the mid LAD and had DES to LCx.  The procedure was complicated by probable distal embolization with peri-procedural infarction.  EF was preserved on echo done 5/14.  After discharge, he was seen in the office with significant dyspnea.  Brilinta was stopped and he was started on Effient.  The dyspnea resolved.  Metoprolol was stopped due to bradycardia.    About 2 months ago, Hayden Rivera got influenza, then feels like he never got over it.  He developed exertional dyspnea.  Currently, this is a little better but still present.  He is short of breath walking up a hill or a flight of steps.  He also gets episodes where he will be short of breath and diaphoretic with no particular trigger.  Possibly associated with anxiety.  No chest pain or pressure. No palpitations or lightheadedness.   He has been having knee pain and needed arthroscopy.  He was seen in this office by Kerin Ransom for pre-operative evaluation and given the above symptoms, set up for Cardiolite and echo. The Cardiolite was done earlier this month and was read as "high risk" with anterior and apical scar and inferior/inferolateral scar + prominent peri-infarct ischemia and EF 49%.  Echo showed EF 50% with wall motion abnormalities. He returns today to followup on this.  BP is high today.   Labs (6/14): K 4.3, creatinine 0.9, BNP 44 Labs (11/14): K 3.8, creatinine 0.9, LDL 62, HDL 51 Labs (5/15): creatinine 1.01 Labs (10/15): LDL 59, HDL 43  ECG: sinus bradycardia with PVC, left axis deviation, old ASMI  PMH: 1. CAD: Anterior STEMI in 0865 complicated by ventricular fibrillation arrest.  PCI to LAD at [facility].  Stent thrombosis in 2006, had DES to LAD in [place].  5/14 had balloon  angioplasty to mid LAD (in-stent restenosis) and DES to LCx for unstable angina.  Echo (5/14) with EF 60%, no regional wall motion abnormalities, aortic sclerosis without stenosis. Lexiscan Cardiolite (4/16) with EF 49%, small apical anterior/apex scar, large basal-mid inferoseptal/inferior/inferolateral scar + prominent peri-infarct ischemia (read as high risk).  Echo (4/16) with EF 50%, mid to apical anteroseptal akinesis, mid to apical inferior akinesis, very mild aortic stenosis.  2. 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  3. Hyperlipidemia. 4. Dyspnea with Brilinta.  5. HTN 6. Palpitations: Holter (7/14) occasional PVCs and PACs.    7. Aortic stenosis: Very mild on 4/16 echo.  8. OA knee  SH: Retired, prior smoker, lives in [place].   FH: CAD  ROS: All systems reviewed and negative except as per HPI.   Current Outpatient Prescriptions  Medication Sig Dispense Refill  . acetaminophen (TYLENOL) 500 MG tablet Take 500 mg by mouth as needed.    Marland Kitchen aspirin 81 MG tablet Take 1 tablet (81 mg total) by mouth daily.    . cholecalciferol (VITAMIN D) 1000 UNITS tablet Vitamin d3 2000 IU DAILY    . clopidogrel (PLAVIX) 75 MG tablet Take 1 tablet (75 mg total) by mouth daily. 90 tablet 3  . HYDROcodone-acetaminophen (NORCO) 5-325 MG per tablet Take 1 tablet by mouth every 6 (six) hours as needed for moderate pain. 30 tablet 0  . losartan (COZAAR) 50 MG tablet TAKE 1  TABLET BY MOUTH DAILY. 90 tablet 0  . Misc Natural Products (OSTEO BI-FLEX TRIPLE STRENGTH PO) Take 1 tablet by mouth 2 (two) times daily.    . naproxen (NAPROSYN) 500 MG tablet Take 1 tablet (500 mg total) by mouth 2 (two) times daily with a meal. 30 tablet 0  . nitroGLYCERIN (NITROSTAT) 0.4 MG SL tablet PLACE 1 TABLET UNDER THE TONGUE EVERY 5 MINUTES UP TO 3 DOSES AS NEEDED FOR CHEST PAIN 100 tablet 3  . Omega-3 Fatty Acids (FISH OIL) 1200 MG CAPS Take 1 capsule by mouth 2 (two) times daily.    Marland Kitchen OVER  THE COUNTER MEDICATION as needed. COUGH SUPPRESSANT.Marland KitchenMarland KitchenTake as directed on bottle    . rosuvastatin (CRESTOR) 10 MG tablet Take 1 tablet (10 mg total) by mouth daily. 90 tablet 0  . isosorbide mononitrate (IMDUR) 60 MG 24 hr tablet Take 1 tablet (60 mg total) by mouth daily. 90 tablet 3   No current facility-administered medications for this visit.    BP 156/92 mmHg  Pulse 66  Ht 5\' 11"  (1.803 m)  Wt 257 lb (116.574 kg)  BMI 35.86 kg/m2 General: NAD, obese Neck: No JVD, no thyromegaly or thyroid nodule.  Lungs: Slight decreased breath sounds right base.  CV: Nondisplaced PMI.  Heart regular S1/S2, no S3/S4, 1/6 SEM RUSB.  Trace ankle edema.  No carotid bruit. Normal pedal pulses.  Abdomen: Soft, nontender, no hepatosplenomegaly, no distention.  Skin: Intact without lesions or rashes.  Neurologic: Alert and oriented x 3.  Psych: Normal affect. Extremities: No clubbing or cyanosis.   Assessment/Plan: 1. CAD: Patient has developed exertional dyspnea over the last 2 months.  Cardiolite is concerning for possible inferior/inferolateral ischemia. EF is mildly depressed by echo and by Cardiolite.  Patient needs a knee arthroscopy.  - Given high risk Cardiolite and his history, I think he needs cardiac catheterization.  We discussed the risks/benefits of the procedure and he agrees to proceed. Will plan for next week. Will get pre-cath labs today. - Continue Plavix long-term given history of stent thrombosis. - Continue ASA 81, Imdur, ARB, statin. Will increase Imdur to 60 mg daily.  If his exertional dyspnea is due to ischemia, this may help.  - Will refill NTG sublingual. - No beta blocker with bradycardia while on low dose metoprolol. - He was instructed to go to ER with any worsening symptoms prior to cath.  2. Palpitations: PVCs and PACs on holter in 7/14.  These seem to have recovered.  3. HTN: BP high today, could be related to anxiety.  He will get a home BP cuff and check daily. I will  review his readings at next appt in 2 wks to see if additional medication is needed.  4. Hyperlipidemia: Good lipids in 10/15.  5. Knee OA: Needs arthroscopy.  Will do cath first and recommendations after this.   Followup with me in 2 wks after cath.    Loralie Champagne 09/25/2014

## 2014-09-29 ENCOUNTER — Other Ambulatory Visit: Payer: Self-pay | Admitting: Cardiology

## 2014-10-02 ENCOUNTER — Encounter (HOSPITAL_COMMUNITY): Admission: RE | Payer: Self-pay | Source: Ambulatory Visit

## 2014-10-02 ENCOUNTER — Encounter (HOSPITAL_COMMUNITY): Payer: Self-pay | Admitting: Cardiology

## 2014-10-02 ENCOUNTER — Encounter (HOSPITAL_COMMUNITY)
Admission: RE | Disposition: A | Discharge status: Home / Self Care | Payer: Commercial Managed Care - HMO | Source: Ambulatory Visit | Attending: Cardiology

## 2014-10-02 ENCOUNTER — Ambulatory Visit (HOSPITAL_COMMUNITY)
Admission: RE | Admit: 2014-10-02 | Payer: Commercial Managed Care - HMO | Source: Ambulatory Visit | Admitting: Cardiology

## 2014-10-02 ENCOUNTER — Other Ambulatory Visit: Payer: Self-pay

## 2014-10-02 ENCOUNTER — Ambulatory Visit (HOSPITAL_COMMUNITY)
Admission: RE | Admit: 2014-10-02 | Discharge: 2014-10-02 | Disposition: A | Discharge status: Home / Self Care | Payer: Commercial Managed Care - HMO | Source: Ambulatory Visit | Attending: Cardiology | Admitting: Cardiology

## 2014-10-02 DIAGNOSIS — Z955 Presence of coronary angioplasty implant and graft: Secondary | ICD-10-CM | POA: Insufficient documentation

## 2014-10-02 DIAGNOSIS — I2510 Atherosclerotic heart disease of native coronary artery without angina pectoris: Secondary | ICD-10-CM | POA: Insufficient documentation

## 2014-10-02 DIAGNOSIS — I10 Essential (primary) hypertension: Secondary | ICD-10-CM | POA: Insufficient documentation

## 2014-10-02 DIAGNOSIS — M179 Osteoarthritis of knee, unspecified: Secondary | ICD-10-CM | POA: Insufficient documentation

## 2014-10-02 DIAGNOSIS — Z87891 Personal history of nicotine dependence: Secondary | ICD-10-CM | POA: Insufficient documentation

## 2014-10-02 DIAGNOSIS — E785 Hyperlipidemia, unspecified: Secondary | ICD-10-CM | POA: Diagnosis not present

## 2014-10-02 DIAGNOSIS — I350 Nonrheumatic aortic (valve) stenosis: Secondary | ICD-10-CM | POA: Insufficient documentation

## 2014-10-02 DIAGNOSIS — I252 Old myocardial infarction: Secondary | ICD-10-CM | POA: Diagnosis not present

## 2014-10-02 DIAGNOSIS — T859XXA Unspecified complication of internal prosthetic device, implant and graft, initial encounter: Secondary | ICD-10-CM | POA: Insufficient documentation

## 2014-10-02 DIAGNOSIS — Z7982 Long term (current) use of aspirin: Secondary | ICD-10-CM | POA: Insufficient documentation

## 2014-10-02 HISTORY — PX: CARDIAC CATHETERIZATION: SHX172

## 2014-10-02 SURGERY — LEFT HEART CATH AND CORONARY ANGIOGRAPHY

## 2014-10-02 SURGERY — LEFT HEART CATHETERIZATION WITH CORONARY ANGIOGRAM
Anesthesia: LOCAL

## 2014-10-02 MED ORDER — SODIUM CHLORIDE 0.9 % WEIGHT BASED INFUSION: 1.0000 mL/kg/h | INTRAVENOUS | Status: DC

## 2014-10-02 MED ORDER — SODIUM CHLORIDE 0.9 % IV SOLN
INTRAVENOUS | Status: DC | PRN
  Administered 2014-10-02: 117 mL/h via INTRAVENOUS

## 2014-10-02 MED ORDER — MIDAZOLAM HCL 2 MG/2ML IJ SOLN
INTRAMUSCULAR | Status: DC | PRN
  Administered 2014-10-02 (×2): 1 mg via INTRAVENOUS

## 2014-10-02 MED ORDER — SODIUM CHLORIDE 0.9 % IV SOLN: 250.0000 mL | INTRAVENOUS | Status: DC | PRN

## 2014-10-02 MED ORDER — SODIUM CHLORIDE 0.9 % IJ SOLN: 3.0000 mL | INTRAMUSCULAR | Status: DC | PRN

## 2014-10-02 MED ORDER — SODIUM CHLORIDE 0.9 % WEIGHT BASED INFUSION: 3.0000 mL/kg/h | INTRAVENOUS | Status: AC

## 2014-10-02 MED ORDER — ACETAMINOPHEN 325 MG PO TABS: 650.0000 mg | ORAL_TABLET | ORAL | Status: DC | PRN

## 2014-10-02 MED ORDER — ASPIRIN 81 MG PO CHEW: 81.0000 mg | CHEWABLE_TABLET | ORAL | Status: DC

## 2014-10-02 MED ORDER — SODIUM CHLORIDE 0.9 % IJ SOLN: 3.0000 mL | Freq: Two times a day (BID) | INTRAMUSCULAR | Status: DC

## 2014-10-02 MED ORDER — HEPARIN SODIUM (PORCINE) 1000 UNIT/ML IJ SOLN
INTRAMUSCULAR | Status: DC | PRN
  Administered 2014-10-02: 5500 [IU] via INTRAVENOUS

## 2014-10-02 MED ORDER — SODIUM CHLORIDE 0.9 % WEIGHT BASED INFUSION: 3.0000 mL/kg/h | INTRAVENOUS | Status: DC

## 2014-10-02 MED ORDER — VERAPAMIL HCL 2.5 MG/ML IV SOLN
INTRAVENOUS | Status: DC | PRN
  Administered 2014-10-02: 3 mg via INTRA_ARTERIAL

## 2014-10-02 MED ORDER — IOHEXOL 350 MG/ML SOLN
INTRAVENOUS | Status: DC | PRN
  Administered 2014-10-02: 85 mL via INTRACARDIAC

## 2014-10-02 MED ORDER — NITROGLYCERIN 0.2 MG/ML ON CALL CATH LAB
INTRAVENOUS | Status: DC | PRN
  Administered 2014-10-02: 200 ug via INTRAVENOUS

## 2014-10-02 MED ORDER — ONDANSETRON HCL 4 MG/2ML IJ SOLN: 4.0000 mg | Freq: Four times a day (QID) | INTRAMUSCULAR | Status: DC | PRN

## 2014-10-02 SURGICAL SUPPLY — 15 items
CATH INFINITI 5 FR JL3.5 (CATHETERS) ×3 IMPLANT
CATH INFINITI 5 FR MPA2 (CATHETERS) IMPLANT
CATH INFINITI 5FR ANG PIGTAIL (CATHETERS) ×3 IMPLANT
CATH INFINITI JR4 5F (CATHETERS) ×3 IMPLANT
DEVICE RAD COMP TR BAND LRG (VASCULAR PRODUCTS) ×3 IMPLANT
GLIDESHEATH SLEND SS 6F .021 (SHEATH) ×3 IMPLANT
KIT HEART LEFT (KITS) ×3 IMPLANT
PACK CARDIAC CATHETERIZATION (CUSTOM PROCEDURE TRAY) ×3 IMPLANT
SHEATH PINNACLE 5F 10CM (SHEATH) IMPLANT
SYR MEDRAD MARK V 150ML (SYRINGE) IMPLANT
TRANSDUCER W/STOPCOCK (MISCELLANEOUS) ×3 IMPLANT
TUBING CIL FLEX 10 FLL-RA (TUBING) ×3 IMPLANT
WIRE EMERALD 3MM-J .035X150CM (WIRE) IMPLANT
WIRE HI TORQ VERSACORE-J 145CM (WIRE) ×3 IMPLANT
WIRE SAFE-T 1.5MM-J .035X260CM (WIRE) ×3 IMPLANT

## 2014-10-02 NOTE — Discharge Instructions (Signed)
Radial Site Care °Refer to this sheet in the next few weeks. These instructions provide you with information on caring for yourself after your procedure. Your caregiver may also give you more specific instructions. Your treatment has been planned according to current medical practices, but problems sometimes occur. Call your caregiver if you have any problems or questions after your procedure. °HOME CARE INSTRUCTIONS °· You may shower the day after the procedure. Remove the bandage (dressing) and gently wash the site with plain soap and water. Gently pat the site dry. °· Do not apply powder or lotion to the site. °· Do not submerge the affected site in water for 3 to 5 days. °· Inspect the site at least twice daily. °· Do not flex or bend the affected arm for 24 hours. °· No lifting over 5 pounds (2.3 kg) for 5 days after your procedure. °· Do not drive home if you are discharged the same day of the procedure. Have someone else drive you. °· You may drive 24 hours after the procedure unless otherwise instructed by your caregiver. °· Do not operate machinery or power tools for 24 hours. °· A responsible adult should be with you for the first 24 hours after you arrive home. °What to expect: °· Any bruising will usually fade within 1 to 2 weeks. °· Blood that collects in the tissue (hematoma) may be painful to the touch. It should usually decrease in size and tenderness within 1 to 2 weeks. °SEEK IMMEDIATE MEDICAL CARE IF: °· You have unusual pain at the radial site. °· You have redness, warmth, swelling, or pain at the radial site. °· You have drainage (other than a small amount of blood on the dressing). °· You have chills. °· You have a fever or persistent symptoms for more than 72 hours. °· You have a fever and your symptoms suddenly get worse. °· Your arm becomes pale, cool, tingly, or numb. °· You have heavy bleeding from the site. Hold pressure on the site and call 911. °Document Released: 06/18/2010 Document  Revised: 08/08/2011 Document Reviewed: 06/18/2010 °ExitCare® Patient Information ©2015 [employer]. This information is not intended to replace advice given to you by your health care provider. Make sure you discuss any questions you have with your health care provider. ° °

## 2014-10-02 NOTE — H&P (View-Only) (Signed)
Patient ID: Hayden Rivera, male   DOB: 19-Oct-1950, 64 y.o.   MRN: [id number] PCP: Dr Laurance Flatten  64 yo with history of CAD presents for cardiology followup.  In 5/14, patient was admitted with unstable angina.  He had angioplasty to in-stent restenosis in the mid LAD and had DES to LCx.  The procedure was complicated by probable distal embolization with peri-procedural infarction.  EF was preserved on echo done 5/14.  After discharge, he was seen in the office with significant dyspnea.  Brilinta was stopped and he was started on Effient.  The dyspnea resolved.  Metoprolol was stopped due to bradycardia.    About 2 months ago, [name] got influenza, then feels like he never got over it.  He developed exertional dyspnea.  Currently, this is a little better but still present.  He is short of breath walking up a hill or a flight of steps.  He also gets episodes where he will be short of breath and diaphoretic with no particular trigger.  Possibly associated with anxiety.  No chest pain or pressure. No palpitations or lightheadedness.   He has been having knee pain and needed arthroscopy.  He was seen in this office by Kerin Ransom for pre-operative evaluation and given the above symptoms, set up for Cardiolite and echo. The Cardiolite was done earlier this month and was read as "high risk" with anterior and apical scar and inferior/inferolateral scar + prominent peri-infarct ischemia and EF 49%.  Echo showed EF 50% with wall motion abnormalities. He returns today to followup on this.  BP is high today.   Labs (6/14): K 4.3, creatinine 0.9, BNP 44 Labs (11/14): K 3.8, creatinine 0.9, LDL 62, HDL 51 Labs (5/15): creatinine 1.01 Labs (10/15): LDL 59, HDL 43  ECG: sinus bradycardia with PVC, left axis deviation, old ASMI  PMH: 1. CAD: Anterior STEMI in 1025 complicated by ventricular fibrillation arrest.  PCI to LAD at [facility].  Stent thrombosis in 2006, had DES to LAD in [place].  5/14 had balloon  angioplasty to mid LAD (in-stent restenosis) and DES to LCx for unstable angina.  Echo (5/14) with EF 60%, no regional wall motion abnormalities, aortic sclerosis without stenosis. Lexiscan Cardiolite (4/16) with EF 49%, small apical anterior/apex scar, large basal-mid inferoseptal/inferior/inferolateral scar + prominent peri-infarct ischemia (read as high risk).  Echo (4/16) with EF 50%, mid to apical anteroseptal akinesis, mid to apical inferior akinesis, very mild aortic stenosis.  2. 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  3. Hyperlipidemia. 4. Dyspnea with Brilinta.  5. HTN 6. Palpitations: Holter (7/14) occasional PVCs and PACs.    7. Aortic stenosis: Very mild on 4/16 echo.  8. OA knee  SH: Retired, prior smoker, lives in [place].   FH: CAD  ROS: All systems reviewed and negative except as per HPI.   Current Outpatient Prescriptions  Medication Sig Dispense Refill  . acetaminophen (TYLENOL) 500 MG tablet Take 500 mg by mouth as needed.    Marland Kitchen aspirin 81 MG tablet Take 1 tablet (81 mg total) by mouth daily.    . cholecalciferol (VITAMIN D) 1000 UNITS tablet Vitamin d3 2000 IU DAILY    . clopidogrel (PLAVIX) 75 MG tablet Take 1 tablet (75 mg total) by mouth daily. 90 tablet 3  . HYDROcodone-acetaminophen (NORCO) 5-325 MG per tablet Take 1 tablet by mouth every 6 (six) hours as needed for moderate pain. 30 tablet 0  . losartan (COZAAR) 50 MG tablet TAKE 1  TABLET BY MOUTH DAILY. 90 tablet 0  . Misc Natural Products (OSTEO BI-FLEX TRIPLE STRENGTH PO) Take 1 tablet by mouth 2 (two) times daily.    . naproxen (NAPROSYN) 500 MG tablet Take 1 tablet (500 mg total) by mouth 2 (two) times daily with a meal. 30 tablet 0  . nitroGLYCERIN (NITROSTAT) 0.4 MG SL tablet PLACE 1 TABLET UNDER THE TONGUE EVERY 5 MINUTES UP TO 3 DOSES AS NEEDED FOR CHEST PAIN 100 tablet 3  . Omega-3 Fatty Acids (FISH OIL) 1200 MG CAPS Take 1 capsule by mouth 2 (two) times daily.    Marland Kitchen OVER  THE COUNTER MEDICATION as needed. COUGH SUPPRESSANT.Marland KitchenMarland KitchenTake as directed on bottle    . rosuvastatin (CRESTOR) 10 MG tablet Take 1 tablet (10 mg total) by mouth daily. 90 tablet 0  . isosorbide mononitrate (IMDUR) 60 MG 24 hr tablet Take 1 tablet (60 mg total) by mouth daily. 90 tablet 3   No current facility-administered medications for this visit.    BP 156/92 mmHg  Pulse 66  Ht 5\' 11"  (1.803 m)  Wt 257 lb (116.574 kg)  BMI 35.86 kg/m2 General: NAD, obese Neck: No JVD, no thyromegaly or thyroid nodule.  Lungs: Slight decreased breath sounds right base.  CV: Nondisplaced PMI.  Heart regular S1/S2, no S3/S4, 1/6 SEM RUSB.  Trace ankle edema.  No carotid bruit. Normal pedal pulses.  Abdomen: Soft, nontender, no hepatosplenomegaly, no distention.  Skin: Intact without lesions or rashes.  Neurologic: Alert and oriented x 3.  Psych: Normal affect. Extremities: No clubbing or cyanosis.   Assessment/Plan: 1. CAD: Patient has developed exertional dyspnea over the last 2 months.  Cardiolite is concerning for possible inferior/inferolateral ischemia. EF is mildly depressed by echo and by Cardiolite.  Patient needs a knee arthroscopy.  - Given high risk Cardiolite and his history, I think he needs cardiac catheterization.  We discussed the risks/benefits of the procedure and he agrees to proceed. Will plan for next week. Will get pre-cath labs today. - Continue Plavix long-term given history of stent thrombosis. - Continue ASA 81, Imdur, ARB, statin. Will increase Imdur to 60 mg daily.  If his exertional dyspnea is due to ischemia, this may help.  - Will refill NTG sublingual. - No beta blocker with bradycardia while on low dose metoprolol. - He was instructed to go to ER with any worsening symptoms prior to cath.  2. Palpitations: PVCs and PACs on holter in 7/14.  These seem to have recovered.  3. HTN: BP high today, could be related to anxiety.  He will get a home BP cuff and check daily. I will  review his readings at next appt in 2 wks to see if additional medication is needed.  4. Hyperlipidemia: Good lipids in 10/15.  5. Knee OA: Needs arthroscopy.  Will do cath first and recommendations after this.   Followup with me in 2 wks after cath.    Loralie Champagne 09/25/2014

## 2014-10-02 NOTE — Interval H&P Note (Signed)
Cath Lab Visit (complete for each Cath Lab visit)  Clinical Evaluation Leading to the Procedure:   ACS: No.  Non-ACS:    Anginal Classification: CCS III  Anti-ischemic medical therapy: Minimal Therapy (1 class of medications)  Non-Invasive Test Results: Intermediate-risk stress test findings: cardiac mortality 1-3%/year  Prior CABG: No previous CABG      History and Physical Interval Note:  10/02/2014 11:23 AM  Hayden Rivera  has presented today for surgery, with the diagnosis of cp  The various methods of treatment have been discussed with the patient and family. After consideration of risks, benefits and other options for treatment, the patient has consented to  Procedure(s): Left Heart Cath And Coronary Angiography (N/A) as a surgical intervention .  The patient's history has been reviewed, patient examined, no change in status, stable for surgery.  I have reviewed the patient's chart and labs.  Questions were answered to the patient's satisfaction.     Hayden Rivera

## 2014-10-02 NOTE — Progress Notes (Signed)
CAD Assessment (Coronary Angiography With or Without Left Heart Catheterization and/or Left Ventriculography)  Patient Information:   Suspected CAD (No Prior PCI, No Prior CABG, and No Prior Angiogram Showing > = 50% Angiographic Stenosis)   Prior Noninvasive Testing: Stress Test With Imaging (SPECT MPI, Stress Echocardiography, Stress PET, Stress CMR)   Intermediate-risk findings (e.g., 5% to 10% ischemic myocardium on stress SPECT MPI or stress PET, stress-induced wall motion abnormality in a single segment on stress echo or stress CMR)   Pretest Symptom Status: Symptomatic  AUC Score:   A (7)   Indication:   16  Hayden Rivera 10/02/2014

## 2014-10-03 MED FILL — Heparin Sodium (Porcine) 2 Unit/ML in Sodium Chloride 0.9%: INTRAMUSCULAR | Qty: 1000 | Status: AC

## 2014-10-09 DIAGNOSIS — N200 Calculus of kidney: Secondary | ICD-10-CM | POA: Diagnosis not present

## 2014-10-09 DIAGNOSIS — N5201 Erectile dysfunction due to arterial insufficiency: Secondary | ICD-10-CM | POA: Diagnosis not present

## 2014-10-10 ENCOUNTER — Other Ambulatory Visit: Payer: Self-pay | Admitting: Orthopaedic Surgery

## 2014-10-16 ENCOUNTER — Ambulatory Visit (INDEPENDENT_AMBULATORY_CARE_PROVIDER_SITE_OTHER): Payer: Commercial Managed Care - HMO | Admitting: Cardiology

## 2014-10-16 ENCOUNTER — Encounter: Payer: Self-pay | Admitting: Cardiology

## 2014-10-16 VITALS — BP 138/86 | HR 87 | Ht 71.0 in | Wt 260.0 lb

## 2014-10-16 DIAGNOSIS — I10 Essential (primary) hypertension: Secondary | ICD-10-CM | POA: Diagnosis not present

## 2014-10-16 DIAGNOSIS — Z9861 Coronary angioplasty status: Secondary | ICD-10-CM | POA: Diagnosis not present

## 2014-10-16 DIAGNOSIS — I2510 Atherosclerotic heart disease of native coronary artery without angina pectoris: Secondary | ICD-10-CM

## 2014-10-16 MED ORDER — ISOSORBIDE MONONITRATE ER 30 MG PO TB24: 30.0000 mg | ORAL_TABLET | Freq: Every day | ORAL | Status: DC

## 2014-10-16 NOTE — Patient Instructions (Signed)
Medication Instructions:  Decrease Imdur (isosorbide) to 30mg  daily.  Labwork: None today.   Testing/Procedures: None today.  Follow-Up: Your physician wants you to follow-up in: 6 months with Dr Aundra Dubin. (November 2016).  You will receive a reminder letter in the mail two months in advance. If you don't receive a letter, please call our office to schedule the follow-up appointment.

## 2014-10-17 NOTE — H&P (Signed)
Hayden Rivera is an 64 y.o. male.   Chief Complaint: Right knee scope HPI: Hayden Rivera continues with right knee pain.  It is particularly painful when he tries to walk.  If he twists it's very difficult.  He sometimes will wake from sleep due to the knee.  He is here with his wife Hayden Rivera who has recovered from her shoulder arthroscopy which we did back in December.  Hayden Rivera has had an MRI scan since last time he was here.    MRI:  I reviewed an MRI scan films and report of a study done at the [facility] on 08/05/14.  This shows a moderate size radial tear of the posterior horn of the medial meniscus.  They also read some patellofemoral chondromalacia moderate.  Past Medical History  Diagnosis Date  . Arthritis   . CAD (coronary artery disease)     a. Anterior STEMI 2005 c/b vfib arrest, PCI to LAD at [facility]. b. Stent thrombosis 2006 with DES within prior LAD stent at [facility]. C. 09/2012: s/p balloon angioplasty to mLAD for severe stenosis in previously stented segment & DES to LCx; initial enz neg but ruled in for NSTEMI after post-cath vagal sx, felt d/t distal emboliz of thrombus during case.  . Ischemic cardiomyopathy     a. Unclear prior EF but pt was told heart was weakened in past. b. EF normal 09/2012.  Marland Kitchen Ventricular fibrillation     a. VF arrest 2005 in setting of STEMI.  . Elevated hemidiaphragm     a. Noted 09/2012 - instructed to f/u PCP.  Marland Kitchen Myocardial infarction     Past Surgical History  Procedure Laterality Date  . Back surgery    . Hand surgery    . Cardiac catheterization      stents x2  . Coronary stent placement    . Left heart cath N/A 10/11/2012    Procedure: LEFT HEART CATH;  Surgeon: Sherren Mocha, MD;  Location: [facility] CATH LAB;  Service: Cardiovascular;  Laterality: N/A;  . Cardiac catheterization N/A 10/02/2014    Procedure: Left Heart Cath And Coronary Angiography;  Surgeon: Larey Dresser, MD;  Location: [facility] INVASIVE CV LAB CUPID;  Service: Cardiovascular;  Laterality: N/A;     Family History  Problem Relation Age of Onset  . Heart disease Mother   . Stroke Father   . Healthy Brother    Social History:  reports that he quit smoking about 12 years ago. His smoking use included Cigars and Cigarettes. He has a 5 pack-year smoking history. He does not have any smokeless tobacco history on file. He reports that he does not drink alcohol or use illicit drugs.  Allergies:  Allergies  Allergen Reactions  . Altace [Ramipril] Shortness Of Breath and Other (See Comments)    cough  . Penicillins Swelling    No prescriptions prior to admission    No results found for this or any previous visit (from the past 48 hour(s)). No results found.  Review of Systems  Musculoskeletal: Positive for joint pain.       Right knee  All other systems reviewed and are negative.   There were no vitals taken for this visit. Physical Exam  Constitutional: He is oriented to person, place, and time. He appears well-developed and well-nourished.  HENT:  Head: Normocephalic and atraumatic.  Eyes: Pupils are equal, round, and reactive to light.  Neck: Normal range of motion.  Cardiovascular: Normal rate and regular rhythm.   Respiratory: Effort  normal.  GI: Soft.  Musculoskeletal:  Right knee has trace effusion.  His motion is from 0-120 at which point he has significant pain.  He has pain along the medial joint line and to patellofemoral compression.  There is some mild crepitation towards full extension.  McMurray's test does cause pain in the medial direction today.  There is no pop with that test.   Hip motion is full and pain free and SLR is negative on both sides.  There is no palpable LAD behind either knee.  Sensation and motor function are intact on both sides and there are palpable pulses on both sides.    Neurological: He is alert and oriented to person, place, and time.  Skin: Skin is warm and dry.  Psychiatric: He has a normal mood and affect. His behavior is  normal. Judgment and thought content normal.     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  The injection we gave him helped for a few days.  He has been symptomatic for about 4 or 5 months at this point.  This limits his ability to walk and rest.  We discussed a knee arthroscopy. I reviewed risks of anesthesia and infection as well as potential for DVT related to a knee arthroscopy.  I've stressed the importance of some postoperative physical therapy to optimize results and we will try to set up an appointment.  Two to four weeks for recovery would be typical but that is a little variable. I stressed this will make him better but not new as he does have some underlying moderate degenerative change mostly patellofemoral.    Hayden Rivera PAUL 10/17/2014, 10:13 AM

## 2014-10-17 NOTE — Pre-Procedure Instructions (Signed)
    Hayden Rivera  10/17/2014     Your procedure is scheduled on Friday, May 17,  Report to [facility] Admitting at 11:10 A.M.   Call this number if you have problems the morning of surgery: [phone]              For any other questions, please call [phone], Monday - Friday 8 AM - 4 PM.     Remember:  Do not eat food or drink liquids after midnight Thursday, May 26.  Take these medicines the morning of surgery with A SIP OF WATER --isosorbide mononitrate (IMDUR).                 Take if needed:HYDROcodone-acetaminophen (NORCO)  Or Tylenol, nitroGLYCERIN (NITROSTAT).               Stop taking Vitamins, Osteo- BiFlex, Aleve (Naproxen), Fish Oil.            Do not wear jewelry, make-up or nail polish.  Do not wear lotions, powders, or perfumes.              Men may shave face and neck.  Do not bring valuables to the hospital.  [facility] is not responsible for any belongings or valuables.  Contacts, dentures or bridgework may not be worn into surgery.  Leave your suitcase in the car.  After surgery it may be brought to your room.  For patients admitted to the hospital, discharge time will be determined by your treatment team.  Patients discharged the day of surgery will not be allowed to drive home.   Name and phone number of your driver:   -  Special instructions:  Review  [place] - Preparing For Surgery.  Please read over the following fact sheets that you were given. Pain Booklet, Coughing and Deep Breathing and Surgical Site Infection Prevention

## 2014-10-17 NOTE — Pre-Procedure Instructions (Signed)
    Hayden Rivera  10/17/2014     Your procedure is scheduled on Friday, May 17,  Report to [facility] Admitting at 11:10 A.M.   Call this number if you have problems the morning of surgery: [phone]              For any other questions, please call [phone], Monday - Friday 8 AM - 4 PM.     Remember:  Do not eat food or drink liquids after midnight Thursday, May 26.  Take these medicines the morning of surgery with A SIP OF WATER --isosorbide mononitrate (IMDUR).                 Take if needed:HYDROcodone-acetaminophen (NORCO)  Or Tylenol, nitroGLYCERIN (NITROSTAT).               Stop taking Vitamins, Osteo- BiFlex, Aleve (Naproxen), Fish Oil.            Do not wear jewelry, make-up or nail polish.  Do not wear lotions, powders, or perfumes.              Men may shave face and neck.  Do not bring valuables to the hospital.  [facility] is not responsible for any belongings or valuables.  Contacts, dentures or bridgework may not be worn into surgery.  Leave your suitcase in the car.  After surgery it may be brought to your room.  For patients admitted to the hospital, discharge time will be determined by your treatment team.  Patients discharged the day of surgery will not be allowed to drive home.   Name and phone number of your driver:   -  Special instructions:  Review   - Preparing For Surgery.  Please read over the following fact sheets that you were given. Pain Booklet, Coughing and Deep Breathing and Surgical Site Infection Prevention

## 2014-10-18 NOTE — Progress Notes (Signed)
Patient ID: Hayden Rivera, male   DOB: 1950-11-04, 64 y.o.   MRN: [id number] PCP: Dr Laurance Flatten  64 yo with history of CAD presents for cardiology followup.  In 5/14, patient was admitted with unstable angina.  He had angioplasty to in-stent restenosis in the mid LAD and had DES to LCx.  The procedure was complicated by probable distal embolization with peri-procedural infarction.  EF was preserved on echo done 5/14.  After discharge, he was seen in the office with significant dyspnea.  Brilinta was stopped and he was started on Effient.  The dyspnea resolved.  Metoprolol was stopped due to bradycardia.    About 2 months ago, Hayden Rivera got influenza, then feels like he never got over it.  He developed exertional dyspnea.  He was short of breath walking up a hill or a flight of steps.  He also gets episodes where he will be short of breath and diaphoretic with no particular trigger.  Possibly associated with anxiety.  No chest pain or pressure. No palpitations or lightheadedness.   He has been having knee pain and needed arthroscopy.  He was seen in this office by Kerin Ransom for pre-operative evaluation and given the above symptoms, set up for Cardiolite and echo. The Cardiolite was read as "high risk" with anterior and apical scar and inferior/inferolateral scar + prominent peri-infarct ischemia and EF 49%.  Echo showed EF 50% with wall motion abnormalities. LHC was done, showing no tight stenoses and patent stents.   Since then, he has had no chest pain.  He has dyspnea with heavier exertion.  Labs (6/14): K 4.3, creatinine 0.9, BNP 44 Labs (11/14): K 3.8, creatinine 0.9, LDL 62, HDL 51 Labs (5/15): creatinine 1.01 Labs (10/15): LDL 59, HDL 43 Labs (4/16): K 3.8, creatinine 0.81  PMH: 1. CAD: Anterior STEMI in 0932 complicated by ventricular fibrillation arrest.  [facility] LAD [facility].  Stent thrombosis in 2006, had DES to LAD in [place].  5/14 had balloon angioplasty to mid LAD (in-stent restenosis)  and DES to LCx for unstable angina.  Echo (5/14) with EF 60%, no regional wall motion abnormalities, aortic sclerosis without stenosis. Lexiscan Cardiolite (4/16) with EF 49%, small apical anterior/apex scar, large basal-mid inferoseptal/inferior/inferolateral scar + prominent peri-infarct ischemia (read as high risk).  Echo (4/16) with EF 50%, mid to apical anteroseptal akinesis, mid to apical inferior akinesis, very mild aortic stenosis.  LHC (5/16) with 35% LM stenosis, serial 30% RCA stenoses, serial 30% and 40% proximal LAD stenoses, 50% OM1 stenosis.  2. 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  3. Hyperlipidemia. 4. Dyspnea with Brilinta.  5. HTN 6. Palpitations: Holter (7/14) occasional PVCs and PACs.    7. Aortic stenosis: Very mild on 4/16 echo.  8. OA knee  SH: Retired, prior smoker, lives in [place].   FH: CAD  ROS: All systems reviewed and negative except as per HPI.   Current Outpatient Prescriptions  Medication Sig Dispense Refill  . acetaminophen (TYLENOL) 500 MG tablet Take 500 mg by mouth as needed.    Marland Kitchen aspirin 81 MG tablet Take 1 tablet (81 mg total) by mouth daily.    . cholecalciferol (VITAMIN D) 1000 UNITS tablet Take 2,000 Units by mouth daily. Vitamin d3 2000 IU DAILY    . clopidogrel (PLAVIX) 75 MG tablet Take 1 tablet (75 mg total) by mouth daily. 90 tablet 3  . HYDROcodone-acetaminophen (NORCO) 5-325 MG per tablet Take 1 tablet by mouth every  6 (six) hours as needed for moderate pain. 30 tablet 0  . losartan (COZAAR) 50 MG tablet TAKE 1 TABLET BY MOUTH DAILY. 90 tablet 0  . Misc Natural Products (OSTEO BI-FLEX TRIPLE STRENGTH PO) Take 1 tablet by mouth 2 (two) times daily.    . naproxen (NAPROSYN) 500 MG tablet Take 1 tablet (500 mg total) by mouth 2 (two) times daily with a meal. 30 tablet 0  . nitroGLYCERIN (NITROSTAT) 0.4 MG SL tablet PLACE 1 TABLET UNDER THE TONGUE EVERY 5 MINUTES UP TO 3 DOSES AS NEEDED FOR CHEST PAIN 100 tablet  3  . Omega-3 Fatty Acids (FISH OIL) 1200 MG CAPS Take 1 capsule by mouth 2 (two) times daily.    Marland Kitchen OVER THE COUNTER MEDICATION as needed. COUGH SUPPRESSANT.Marland KitchenMarland KitchenTake as directed on bottle    . rosuvastatin (CRESTOR) 10 MG tablet Take 1 tablet (10 mg total) by mouth daily. 90 tablet 0  . isosorbide mononitrate (IMDUR) 30 MG 24 hr tablet Take 1 tablet (30 mg total) by mouth daily. 90 tablet 3   No current facility-administered medications for this visit.    BP 138/86 mmHg  Pulse 87  Ht 5\' 11"  (1.803 m)  Wt 260 lb (117.935 kg)  BMI 36.28 kg/m2 General: NAD, obese Neck: Thick, no JVD, no thyromegaly or thyroid nodule.  Lungs: Slight decreased breath sounds right base.  CV: Nondisplaced PMI.  Heart regular S1/S2, no S3/S4, 1/6 SEM RUSB.  Trace ankle edema.  No carotid bruit. Normal pedal pulses.  Abdomen: Soft, nontender, no hepatosplenomegaly, no distention.  Skin: Intact without lesions or rashes.  Neurologic: Alert and oriented x 3.  Psych: Normal affect. Extremities: No clubbing or cyanosis.   Assessment/Plan: 1. CAD: Patient had Cardiolite concerning for possible inferior/inferolateral ischemia. EF was mildly depressed by echo and by Cardiolite.  I took him for [facility] in 5/16.  There was no tight stenosis.  I suspect the Cardiolite abnormality was due to prior MI.  - Continue Plavix long-term given history of stent thrombosis. - Continue ASA 81, Imdur, ARB, statin. He can decrease Imdur back to 30 mg daily. - No beta blocker with bradycardia while on low dose metoprolol.  2. Palpitations: PVCs and PACs on holter in 7/14.  These seem to have recovered.  3. HTN: BP upper normal today.  He will get a home BP cuff and check daily (I gave him another prescription today). We will call him in 2 weeks to get readings.  4. Hyperlipidemia: Good lipids in 10/15.  5. Knee OA: Needs arthroscopy.  I think that he is reasonable risk to undergo the procedure.  He will need to stop his Plavix 5 days prior  to procedure unless told otherwise by the surgeon.    Loralie Champagne 10/18/2014

## 2014-10-20 ENCOUNTER — Encounter (HOSPITAL_COMMUNITY): Payer: Self-pay

## 2014-10-20 ENCOUNTER — Encounter (HOSPITAL_COMMUNITY)
Admission: RE | Admit: 2014-10-20 | Discharge: 2014-10-20 | Disposition: A | Discharge status: Home / Self Care | Payer: Commercial Managed Care - HMO | Source: Ambulatory Visit | Attending: Orthopaedic Surgery | Admitting: Orthopaedic Surgery

## 2014-10-20 DIAGNOSIS — I2510 Atherosclerotic heart disease of native coronary artery without angina pectoris: Secondary | ICD-10-CM | POA: Insufficient documentation

## 2014-10-20 DIAGNOSIS — I252 Old myocardial infarction: Secondary | ICD-10-CM | POA: Insufficient documentation

## 2014-10-20 DIAGNOSIS — Z01812 Encounter for preprocedural laboratory examination: Secondary | ICD-10-CM | POA: Diagnosis not present

## 2014-10-20 DIAGNOSIS — Z955 Presence of coronary angioplasty implant and graft: Secondary | ICD-10-CM | POA: Insufficient documentation

## 2014-10-20 HISTORY — DX: Reserved for inherently not codable concepts without codable children: IMO0001

## 2014-10-20 HISTORY — DX: Presence of spectacles and contact lenses: Z97.3

## 2014-10-20 LAB — CBC
HEMATOCRIT: 43.6 % (ref 39.0–52.0)
Hemoglobin: 14.9 g/dL (ref 13.0–17.0)
MCH: 33 pg (ref 26.0–34.0)
MCHC: 34.2 g/dL (ref 30.0–36.0)
MCV: 96.5 fL (ref 78.0–100.0)
PLATELETS: 175 10*3/uL (ref 150–400)
RBC: 4.52 MIL/uL (ref 4.22–5.81)
RDW: 12.8 % (ref 11.5–15.5)
WBC: 8.4 10*3/uL (ref 4.0–10.5)

## 2014-10-20 LAB — BASIC METABOLIC PANEL
Anion gap: 11 (ref 5–15)
BUN: 12 mg/dL (ref 6–20)
CHLORIDE: 106 mmol/L (ref 101–111)
CO2: 24 mmol/L (ref 22–32)
Calcium: 9.6 mg/dL (ref 8.9–10.3)
Creatinine, Ser: 0.94 mg/dL (ref 0.61–1.24)
GFR calc Af Amer: >60 mL/min (ref >60)
GFR calc non Af Amer: >60 mL/min (ref >60)
Glucose, Bld: 155 mg/dL — ABNORMAL HIGH (ref 65–99)
POTASSIUM: 3.7 mmol/L (ref 3.5–5.1)
Sodium: 141 mmol/L (ref 135–145)

## 2014-10-20 NOTE — Progress Notes (Signed)
Pt denies SOB at present but stated that he does have a history of episodes of SOB at rest and with exertion. Pt denies chest pain but is under the care of Dr. Aundra Dubin, cardiology. Spoke with Sandi Raveling at Dr. Jerald Kief office regarding pt Plavix and Aspirin instructions. According to Juliann Pulse, pt is to continue with both and take the morning of surgery. Pt called and instructed to take both Plavix and Aspirin the AM of procedure with a sip of water. Pt verbalized understanding of instructions. Spoke with Ebony Hail, PA ( anesthesia)  to review pt chart given pt cardiac history.

## 2014-10-20 NOTE — Progress Notes (Signed)
   10/20/14 1019  OBSTRUCTIVE SLEEP APNEA  Have you ever been diagnosed with sleep apnea through a sleep study? No  Do you snore loudly (loud enough to be heard through closed doors)?  0  Do you often feel tired, fatigued, or sleepy during the daytime? 0  Has anyone observed you stop breathing during your sleep? 0  Do you have, or are you being treated for high blood pressure? 0  BMI more than 35 kg/m2? 1  Age over 65 years old? 1  Neck circumference greater than 40 cm/16 inches? 1  Gender: 1

## 2014-10-20 NOTE — Pre-Procedure Instructions (Signed)
Rockney Grenz  10/20/2014      [facility] - [place], [place] - [address] [place] [address] Phone: [phone] Fax: [phone] [address], [place] [address] [place] [address] Phone: [phone] Fax: [phone]    Your procedure is scheduled on Friday, Oct 24, 2014  [facility] at 11:30 A.M.  Call this number if you have problems the morning of surgery:  [phone]   Remember: Follow doctors instructions regarding Plavix  Do not eat food or drink liquids after midnight Thursday, Oct 23, 2014  Take these medicines the morning of surgery with A SIP OF WATER : isosorbide mononitrate (IMDUR),  if needed: Pain medication,  nitroGLYCERIN (NITROSTAT) for chest pain    Stop taking over the counter vitamins and herbal medications such as Omega-3 Fatty Acids (FISH OIL) and OSTEO BI-FLEX TRIPLE STRENGTH). Do not take any NSAIDs ie: Ibuprofen, Advil, Naproxen and etc.   Do not wear jewelry, make-up or nail polish.  Do not wear lotions, powders, or perfumes.  You may not wear deodorant.  Do not shave 48 hours prior to surgery.  Men may shave face and neck.  Do not bring valuables to the hospital.  [facility] is not responsible for any belongings or valuables.  Contacts, dentures or bridgework may not be worn into surgery.  Leave your suitcase in the car.  After surgery it may be brought to your room.  For patients admitted to the hospital, discharge time will be determined by your treatment team.  Patients discharged the day of surgery will not be allowed to drive home.   Name and phone number of your driver:    Special instructions:   Special Instructions:Special Instructions: [facility] - Preparing for Surgery  Before surgery, you can play an important role.  Because skin is not sterile, your skin needs to be as free of germs as possible.  You can  reduce the number of germs on you skin by washing with CHG (chlorahexidine gluconate) soap before surgery.  CHG is an antiseptic cleaner which kills germs and bonds with the skin to continue killing germs even after washing.  Please DO NOT use if you have an allergy to CHG or antibacterial soaps.  If your skin becomes reddened/irritated stop using the CHG and inform your nurse when you arrive at Short Stay.  Do not shave (including legs and underarms) for at least 48 hours prior to the first CHG shower.  You may shave your face.  Please follow these instructions carefully:   1.  Shower with CHG Soap the night before surgery and the morning of Surgery.  2.  If you choose to wash your hair, wash your hair first as usual with your normal shampoo.  3.  After you shampoo, rinse your hair and body thoroughly to remove the Shampoo.  4.  Use CHG as you would any other liquid soap.  You can apply chg directly  to the skin and wash gently with scrungie or a clean washcloth.  5.  Apply the CHG Soap to your body ONLY FROM THE NECK DOWN.  Do not use on open wounds or open sores.  Avoid contact with your eyes, ears, mouth and genitals (private parts).  Wash genitals (private parts) with your normal soap.  6.  Wash thoroughly, paying special attention to the area where your surgery  will be performed.  7.  Thoroughly rinse your body with warm water from the neck down.  8.  DO NOT shower/wash with your normal soap after using and rinsing off the CHG Soap.  9.  Pat yourself dry with a clean towel.            10.  Wear clean pajamas.            11.  Place clean sheets on your bed the night of your first shower and do not sleep with pets.  Day of Surgery  Do not apply any lotions/deodorants the morning of surgery.  Please wear clean clothes to the hospital/surgery center.  Please read over the following fact sheets that you were given. Pain Booklet, Coughing and Deep Breathing and Surgical Site Infection  Prevention

## 2014-10-20 NOTE — Progress Notes (Signed)
Anesthesia Chart Review:  Pt is 64 year old male scheduled for R knee arthroscopy on 10/24/2014 with Dr. Rhona Raider.   Cardiologist is Dr. Aundra Dubin. Last office visit 10/18/2014.   PMH includes: CAD (a. Anterior STEMI 2005 c/b vfib arrest, PCI to LAD at [facility]. b. Stent thrombosis 2006 with DES within prior LAD stent at [facility]. c. 09/2012: s/p balloon angioplasty to mLAD for severe stenosis in previously stented segment & DES to LCx; initial enz neg but ruled in for NSTEMI after post-cath vagal sx, felt d/t distal emboliz of thrombus during case), ischemic cardiomyopathy. Former smoker. BMI 36.   Medications include: ASA, plavix, imdur, losartan. Pt to continue ASA and plavix perioperatively.   Preoperative labs reviewed.    Chest x-ray 10/23/2013 reviewed. No active cardiopulmonary disease.   EKG 10/02/2014: NSR. Left axis deviation. Low voltage QRS. Inferior infarct, age undetermined. Possible Anterolateral infarct, age undetermined  Cardiac cath 10/02/2014 (for abnormal stress test):  LM lesion, 35% stenosed.  Prox RCA-1 lesion, 30% stenosed.  Prox RCA-2 lesion, 30% stenosed.  Prox LAD-2 lesion, 30% stenosed.  Prox LAD-1 lesion, 40% stenosed.  1st Mrg lesion, 50% stenosed.  Mid Cx lesion, 20% stenosed.  Stents patent. The most significant lesion present was a 50% ostial stenosis in a high OM1. I think that the Cardiolite defect was likely reflective of a prior MI.   Echo 09/16/2014:  - Left ventricle: The cavity size was normal. Wall thickness was normal. Systolic function was normal. The estimated ejection fraction was in the range of 50% to 55%. There is akinesis of the mid-apicalanteroseptal myocardium. There is akinesis of the mid-apicalinferior myocardium. Features are consistent with a pseudonormal left ventricular filling pattern, with concomitant abnormal relaxation and increased filling pressure (grade 2 diastolic dysfunction). - Aortic valve: Valve mobility was restricted.  There was very mild stenosis. Impressions:- Distal septal and distal inferior akinesis with overall lownormal LV function; grade 2 diastolic dysfunction; thickened aortic valve with mild AS (mean gradient 10 mmHg); trace MR and TR.  Pt has cardiac clearance for procedure from Dr. Aundra Dubin in 10/18/2014 Epic note.   If no changes, I anticipate pt can proceed with surgery as scheduled.   Willeen Cass, FNP-BC [facility]/Anesthesiology Phone: [phone] 10/20/2014 3:33 PM

## 2014-10-21 ENCOUNTER — Telehealth: Payer: Self-pay | Admitting: *Deleted

## 2014-10-21 NOTE — Telephone Encounter (Signed)
I left message for patient to return call in regards to needing a sleep study test based on results from pre-operative report

## 2014-10-22 NOTE — Telephone Encounter (Signed)
Patient aware and states he will call us back once he gets over his surgery.

## 2014-10-23 MED ORDER — VANCOMYCIN HCL 10 G IV SOLR
1500.0000 mg | INTRAVENOUS | Status: AC
  Administered 2014-10-24 (×2): 1500 mg via INTRAVENOUS
  Filled 2014-10-23: qty 1500

## 2014-10-24 ENCOUNTER — Encounter (HOSPITAL_COMMUNITY)
Admission: RE | Disposition: A | Discharge status: Home / Self Care | Payer: Self-pay | Source: Ambulatory Visit | Attending: Orthopaedic Surgery

## 2014-10-24 ENCOUNTER — Ambulatory Visit (HOSPITAL_COMMUNITY): Payer: Commercial Managed Care - HMO | Admitting: Certified Registered Nurse Anesthetist

## 2014-10-24 ENCOUNTER — Ambulatory Visit (HOSPITAL_COMMUNITY)
Admission: RE | Admit: 2014-10-24 | Discharge: 2014-10-24 | Disposition: A | Discharge status: Home / Self Care | Payer: Commercial Managed Care - HMO | Source: Ambulatory Visit | Attending: Orthopaedic Surgery | Admitting: Orthopaedic Surgery

## 2014-10-24 ENCOUNTER — Encounter (HOSPITAL_COMMUNITY): Payer: Self-pay | Admitting: *Deleted

## 2014-10-24 ENCOUNTER — Ambulatory Visit (HOSPITAL_COMMUNITY): Payer: Commercial Managed Care - HMO | Admitting: Vascular Surgery

## 2014-10-24 DIAGNOSIS — I255 Ischemic cardiomyopathy: Secondary | ICD-10-CM | POA: Diagnosis not present

## 2014-10-24 DIAGNOSIS — S83241A Other tear of medial meniscus, current injury, right knee, initial encounter: Secondary | ICD-10-CM | POA: Insufficient documentation

## 2014-10-24 DIAGNOSIS — Z87891 Personal history of nicotine dependence: Secondary | ICD-10-CM | POA: Diagnosis not present

## 2014-10-24 DIAGNOSIS — X58XXXA Exposure to other specified factors, initial encounter: Secondary | ICD-10-CM | POA: Diagnosis not present

## 2014-10-24 DIAGNOSIS — M94261 Chondromalacia, right knee: Secondary | ICD-10-CM | POA: Insufficient documentation

## 2014-10-24 DIAGNOSIS — Z6836 Body mass index (BMI) 36.0-36.9, adult: Secondary | ICD-10-CM | POA: Diagnosis not present

## 2014-10-24 DIAGNOSIS — M2241 Chondromalacia patellae, right knee: Secondary | ICD-10-CM | POA: Diagnosis not present

## 2014-10-24 DIAGNOSIS — I252 Old myocardial infarction: Secondary | ICD-10-CM | POA: Insufficient documentation

## 2014-10-24 DIAGNOSIS — Z880 Allergy status to penicillin: Secondary | ICD-10-CM | POA: Insufficient documentation

## 2014-10-24 DIAGNOSIS — Z7902 Long term (current) use of antithrombotics/antiplatelets: Secondary | ICD-10-CM | POA: Diagnosis not present

## 2014-10-24 DIAGNOSIS — Z888 Allergy status to other drugs, medicaments and biological substances status: Secondary | ICD-10-CM | POA: Diagnosis not present

## 2014-10-24 DIAGNOSIS — Y9389 Activity, other specified: Secondary | ICD-10-CM | POA: Insufficient documentation

## 2014-10-24 DIAGNOSIS — Z955 Presence of coronary angioplasty implant and graft: Secondary | ICD-10-CM | POA: Diagnosis not present

## 2014-10-24 DIAGNOSIS — M1711 Unilateral primary osteoarthritis, right knee: Secondary | ICD-10-CM | POA: Insufficient documentation

## 2014-10-24 DIAGNOSIS — Y998 Other external cause status: Secondary | ICD-10-CM | POA: Insufficient documentation

## 2014-10-24 DIAGNOSIS — M1990 Unspecified osteoarthritis, unspecified site: Secondary | ICD-10-CM | POA: Diagnosis not present

## 2014-10-24 DIAGNOSIS — Z79899 Other long term (current) drug therapy: Secondary | ICD-10-CM | POA: Diagnosis not present

## 2014-10-24 DIAGNOSIS — Y9289 Other specified places as the place of occurrence of the external cause: Secondary | ICD-10-CM | POA: Diagnosis not present

## 2014-10-24 DIAGNOSIS — I4901 Ventricular fibrillation: Secondary | ICD-10-CM | POA: Diagnosis not present

## 2014-10-24 DIAGNOSIS — I2510 Atherosclerotic heart disease of native coronary artery without angina pectoris: Secondary | ICD-10-CM | POA: Insufficient documentation

## 2014-10-24 DIAGNOSIS — M25461 Effusion, right knee: Secondary | ICD-10-CM | POA: Insufficient documentation

## 2014-10-24 DIAGNOSIS — M96830 Postprocedural hemorrhage and hematoma of a musculoskeletal structure following a musculoskeletal system procedure: Secondary | ICD-10-CM | POA: Diagnosis not present

## 2014-10-24 DIAGNOSIS — Z7982 Long term (current) use of aspirin: Secondary | ICD-10-CM | POA: Diagnosis not present

## 2014-10-24 DIAGNOSIS — Z8674 Personal history of sudden cardiac arrest: Secondary | ICD-10-CM | POA: Diagnosis not present

## 2014-10-24 DIAGNOSIS — I10 Essential (primary) hypertension: Secondary | ICD-10-CM | POA: Insufficient documentation

## 2014-10-24 DIAGNOSIS — L7621 Postprocedural hemorrhage and hematoma of skin and subcutaneous tissue following a dermatologic procedure: Secondary | ICD-10-CM | POA: Diagnosis not present

## 2014-10-24 HISTORY — PX: KNEE ARTHROSCOPY: SHX127

## 2014-10-24 SURGERY — ARTHROSCOPY, KNEE
Anesthesia: General | Site: Knee | Laterality: Right

## 2014-10-24 MED ORDER — CHLORHEXIDINE GLUCONATE 4 % EX LIQD: 60.0000 mL | Freq: Once | CUTANEOUS | Status: DC

## 2014-10-24 MED ORDER — PROPOFOL 10 MG/ML IV BOLUS
INTRAVENOUS | Status: AC
  Filled 2014-10-24: qty 20

## 2014-10-24 MED ORDER — LABETALOL HCL 5 MG/ML IV SOLN
INTRAVENOUS | Status: AC
  Filled 2014-10-24: qty 4

## 2014-10-24 MED ORDER — PROPOFOL 10 MG/ML IV BOLUS
INTRAVENOUS | Status: DC | PRN
  Administered 2014-10-24: 200 mg via INTRAVENOUS

## 2014-10-24 MED ORDER — SODIUM CHLORIDE 0.9 % IR SOLN
Status: DC | PRN
  Administered 2014-10-24: 1000 mL
  Administered 2014-10-24 (×2): 3000 mL

## 2014-10-24 MED ORDER — FENTANYL CITRATE (PF) 250 MCG/5ML IJ SOLN
INTRAMUSCULAR | Status: AC
  Filled 2014-10-24: qty 5

## 2014-10-24 MED ORDER — ONDANSETRON HCL 4 MG/2ML IJ SOLN
INTRAMUSCULAR | Status: DC | PRN
  Administered 2014-10-24: 4 mg via INTRAVENOUS

## 2014-10-24 MED ORDER — MIDAZOLAM HCL 2 MG/2ML IJ SOLN
INTRAMUSCULAR | Status: AC
  Filled 2014-10-24: qty 2

## 2014-10-24 MED ORDER — LACTATED RINGERS IV SOLN: INTRAVENOUS | Status: DC

## 2014-10-24 MED ORDER — MORPHINE SULFATE 4 MG/ML IJ SOLN
INTRAMUSCULAR | Status: DC | PRN
  Administered 2014-10-24: 1 mg via INTRAVENOUS

## 2014-10-24 MED ORDER — PROMETHAZINE HCL 25 MG/ML IJ SOLN: 6.2500 mg | INTRAMUSCULAR | Status: DC | PRN

## 2014-10-24 MED ORDER — FENTANYL CITRATE (PF) 100 MCG/2ML IJ SOLN
INTRAMUSCULAR | Status: DC | PRN
  Administered 2014-10-24: 100 ug via INTRAVENOUS
  Administered 2014-10-24 (×3): 50 ug via INTRAVENOUS

## 2014-10-24 MED ORDER — MORPHINE SULFATE 4 MG/ML IJ SOLN
INTRAMUSCULAR | Status: AC
  Filled 2014-10-24: qty 1

## 2014-10-24 MED ORDER — MIDAZOLAM HCL 5 MG/5ML IJ SOLN
INTRAMUSCULAR | Status: DC | PRN
  Administered 2014-10-24: 2 mg via INTRAVENOUS

## 2014-10-24 MED ORDER — LIDOCAINE HCL (CARDIAC) 20 MG/ML IV SOLN
INTRAVENOUS | Status: DC | PRN
  Administered 2014-10-24: 100 mg via INTRAVENOUS

## 2014-10-24 MED ORDER — LACTATED RINGERS IV SOLN
INTRAVENOUS | Status: DC
  Administered 2014-10-24: 13:00:00 via INTRAVENOUS

## 2014-10-24 MED ORDER — HYDROMORPHONE HCL 1 MG/ML IJ SOLN
INTRAMUSCULAR | Status: AC
  Filled 2014-10-24: qty 1

## 2014-10-24 MED ORDER — HYDROMORPHONE HCL 1 MG/ML IJ SOLN
0.2500 mg | INTRAMUSCULAR | Status: DC | PRN
  Administered 2014-10-24: 0.5 mg via INTRAVENOUS

## 2014-10-24 MED ORDER — BUPIVACAINE HCL 0.5 % IJ SOLN
INTRAMUSCULAR | Status: DC | PRN
  Administered 2014-10-24: 1 mL via INTRA_ARTICULAR

## 2014-10-24 MED ORDER — BUPIVACAINE-EPINEPHRINE 0.5% -1:200000 IJ SOLN
INTRAMUSCULAR | Status: DC | PRN
  Administered 2014-10-24: 30 mL

## 2014-10-24 MED ORDER — LABETALOL HCL 5 MG/ML IV SOLN
INTRAVENOUS | Status: DC | PRN
  Administered 2014-10-24: 5 mg via INTRAVENOUS

## 2014-10-24 MED ORDER — HYDROCODONE-ACETAMINOPHEN 7.5-325 MG PO TABS: 1.0000 | ORAL_TABLET | Freq: Once | ORAL | Status: DC | PRN

## 2014-10-24 MED ORDER — METHYLPREDNISOLONE ACETATE 80 MG/ML IJ SUSP
INTRAMUSCULAR | Status: AC
  Filled 2014-10-24: qty 1

## 2014-10-24 MED ORDER — LACTATED RINGERS IV SOLN
INTRAVENOUS | Status: DC | PRN
  Administered 2014-10-24: 14:00:00 via INTRAVENOUS

## 2014-10-24 SURGICAL SUPPLY — 34 items
BANDAGE ELASTIC 6 VELCRO ST LF (GAUZE/BANDAGES/DRESSINGS) ×3 IMPLANT
BLADE GREAT WHITE 4.2 (BLADE) ×2 IMPLANT
BLADE GREAT WHITE 4.2MM (BLADE) ×1
BLADE SURG 11 STRL SS (BLADE) IMPLANT
BNDG GAUZE ELAST 4 BULKY (GAUZE/BANDAGES/DRESSINGS) ×3 IMPLANT
COVER SURGICAL LIGHT HANDLE (MISCELLANEOUS) ×3 IMPLANT
CUFF TOURNIQUET SINGLE 34IN LL (TOURNIQUET CUFF) IMPLANT
CUFF TOURNIQUET SINGLE 44IN (TOURNIQUET CUFF) IMPLANT
DRAPE ARTHROSCOPY W/POUCH 114 (DRAPES) ×3 IMPLANT
DRAPE U-SHAPE 47X51 STRL (DRAPES) ×3 IMPLANT
DRSG EMULSION OIL 3X3 NADH (GAUZE/BANDAGES/DRESSINGS) ×3 IMPLANT
DRSG PAD ABDOMINAL 8X10 ST (GAUZE/BANDAGES/DRESSINGS) ×3 IMPLANT
DURAPREP 26ML APPLICATOR (WOUND CARE) ×3 IMPLANT
GAUZE SPONGE 4X4 12PLY STRL (GAUZE/BANDAGES/DRESSINGS) ×3 IMPLANT
GLOVE BIO SURGEON STRL SZ8 (GLOVE) ×12 IMPLANT
GLOVE BIOGEL PI IND STRL 8 (GLOVE) ×1 IMPLANT
GLOVE BIOGEL PI INDICATOR 8 (GLOVE) ×2
GOWN STRL REUS W/ TWL LRG LVL3 (GOWN DISPOSABLE) ×3 IMPLANT
GOWN STRL REUS W/ TWL XL LVL3 (GOWN DISPOSABLE) ×3 IMPLANT
GOWN STRL REUS W/TWL 2XL LVL3 (GOWN DISPOSABLE) ×3 IMPLANT
GOWN STRL REUS W/TWL LRG LVL3 (GOWN DISPOSABLE) ×6
GOWN STRL REUS W/TWL XL LVL3 (GOWN DISPOSABLE) ×6
KIT ROOM TURNOVER OR (KITS) ×3 IMPLANT
MANIFOLD NEPTUNE II (INSTRUMENTS) ×3 IMPLANT
NEEDLE 18GX1X1/2 (RX/OR ONLY) (NEEDLE) ×3 IMPLANT
NEEDLE 22X1 1/2 (OR ONLY) (NEEDLE) IMPLANT
NEEDLE SPNL 18GX3.5 QUINCKE PK (NEEDLE) IMPLANT
PACK ARTHROSCOPY DSU (CUSTOM PROCEDURE TRAY) ×3 IMPLANT
PAD ARMBOARD 7.5X6 YLW CONV (MISCELLANEOUS) ×6 IMPLANT
SET ARTHROSCOPY TUBING (MISCELLANEOUS) ×2
SET ARTHROSCOPY TUBING LN (MISCELLANEOUS) ×1 IMPLANT
SYR CONTROL 10ML LL (SYRINGE) ×3 IMPLANT
TOWEL OR 17X24 6PK STRL BLUE (TOWEL DISPOSABLE) ×6 IMPLANT
WATER STERILE IRR 1000ML POUR (IV SOLUTION) ×3 IMPLANT

## 2014-10-24 NOTE — Interval H&P Note (Signed)
OK for surgery PD 

## 2014-10-24 NOTE — Op Note (Signed)
#  [id number] 

## 2014-10-24 NOTE — Progress Notes (Signed)
Orthopedic Tech Progress Note Patient Details:  Hayden Rivera December 14, 1950 [id number] Fit pt. for crutches.  Left crutches with pt.'s nurse. Ortho Devices Type of Ortho Device: Crutches Ortho Device/Splint Interventions: Adjustment   Darrol Poke 10/24/2014, 3:06 PM

## 2014-10-24 NOTE — Anesthesia Procedure Notes (Signed)
Procedure Name: LMA Insertion Date/Time: 10/24/2014 1:43 PM Performed by: Rogers Blocker Pre-anesthesia Checklist: Patient identified, Emergency Drugs available, Suction available, Timeout performed and Patient being monitored Patient Re-evaluated:Patient Re-evaluated prior to inductionOxygen Delivery Method: Circle system utilized Preoxygenation: Pre-oxygenation with 100% oxygen Intubation Type: IV induction LMA: LMA inserted LMA Size: 5.0 Number of attempts: 1 Placement Confirmation: positive ETCO2,  CO2 detector and breath sounds checked- equal and bilateral Tube secured with: Tape Dental Injury: Teeth and Oropharynx as per pre-operative assessment

## 2014-10-24 NOTE — Anesthesia Postprocedure Evaluation (Signed)
  Anesthesia Post-op Note  Patient: Hayden Rivera  Procedure(s) Performed: Procedure(s): ARTHROSCOPY RIGHT KNEE, Partial medial menisectomy and chondroplasty (Right)  Patient Location: PACU  Anesthesia Type:General  Level of Consciousness: awake, alert  and oriented  Airway and Oxygen Therapy: Patient Spontanous Breathing  Post-op Pain: mild  Post-op Assessment: Post-op Vital signs reviewed  Post-op Vital Signs: Reviewed  Last Vitals:  Filed Vitals:   10/24/14 1530  BP:   Pulse: 64  Temp:   Resp: 19    Complications: No apparent anesthesia complications

## 2014-10-24 NOTE — Transfer of Care (Signed)
Immediate Anesthesia Transfer of Care Note  Patient: Hayden Rivera  Procedure(s) Performed: Procedure(s): ARTHROSCOPY RIGHT KNEE, Partial medial menisectomy and chondroplasty (Right)  Patient Location: PACU  Anesthesia Type:General  Level of Consciousness: awake, alert , oriented and patient cooperative  Airway & Oxygen Therapy: Patient Spontanous Breathing and Patient connected to face mask oxygen  Post-op Assessment: Report given to RN, Post -op Vital signs reviewed and stable and Patient moving all extremities X 4  Post vital signs: Reviewed and stable  Last Vitals:  Filed Vitals:   10/24/14 1441  BP: 155/86  Pulse: 67  Temp:   Resp: 17    Complications: No apparent anesthesia complications

## 2014-10-24 NOTE — Anesthesia Preprocedure Evaluation (Addendum)
Anesthesia Evaluation  Patient identified by MRN, date of birth, ID band Patient awake    Reviewed: Allergy & Precautions, NPO status , Patient's Chart, lab work & pertinent test results  Airway Mallampati: II  TM Distance: >3 FB Neck ROM: Full    Dental   Pulmonary former smoker,  breath sounds clear to auscultation        Cardiovascular hypertension, Pt. on medications + CAD, + Past MI and + Cardiac Stents Rhythm:Regular Rate:Normal     Neuro/Psych negative neurological ROS     GI/Hepatic Neg liver ROS, GERD-  ,  Endo/Other  Morbid obesity  Renal/GU negative Renal ROS     Musculoskeletal  (+) Arthritis -,   Abdominal   Peds  Hematology negative hematology ROS (+)   Anesthesia Other Findings   Reproductive/Obstetrics                            Anesthesia Physical Anesthesia Plan  ASA: III  Anesthesia Plan: General   Post-op Pain Management:    Induction: Intravenous  Airway Management Planned: LMA  Additional Equipment:   Intra-op Plan:   Post-operative Plan:   Informed Consent: I have reviewed the patients History and Physical, chart, labs and discussed the procedure including the risks, benefits and alternatives for the proposed anesthesia with the patient or authorized representative who has indicated his/her understanding and acceptance.   Dental advisory given  Plan Discussed with: CRNA  Anesthesia Plan Comments:         Anesthesia Quick Evaluation

## 2014-10-25 ENCOUNTER — Encounter (HOSPITAL_COMMUNITY): Payer: Self-pay | Admitting: Orthopaedic Surgery

## 2014-10-25 NOTE — Op Note (Signed)
NAME:  Hayden Rivera, Hayden Rivera NO.:  [id number]  MEDICAL RECORD NO.:  [id number]  LOCATION:  [facility]                         FACILITY:  [facility]  PHYSICIAN:  Monico Blitz. Iisha Soyars, M.D.DATE OF BIRTH:  1950-09-15  DATE OF PROCEDURE:  10/24/2014 DATE OF DISCHARGE:  10/24/2014                              OPERATIVE REPORT   PREOPERATIVE DIAGNOSES: 1. Right knee torn medial meniscus. 2. Right knee chondromalacia.  POSTOPERATIVE DIAGNOSES: 1. Right knee torn medial meniscus. 2. Right knee chondromalacia.  PROCEDURES: 1. Right knee partial medial meniscectomy. 2. Right knee abrasion chondroplasty, patellofemoral.  ANESTHESIA:  General.  ATTENDING SURGEON:  Monico Blitz. Rhona Raider, M.D.  ASSISTANT:  Loni Dolly, PA.  INDICATION FOR PROCEDURE:  The patient is a 64 year old man with a long history of right knee pain.  This is persisted despite multiple conservative measures.  MRI scan, he has a radial tear of the posterior horn of the medial meniscus and some moderate degenerative change.  He has pain, which limits his ability to rest and walk and he is offered an arthroscopy.  Informed operative consent was obtained after discussion of possible complications including reaction to anesthesia and infection.  The case was scheduled in the main operating room due to some cardiac comorbidities.  He was cleared by his cardiologist preoperatively.  SUMMARY OF FINDINGS AND PROCEDURE:  Under general anesthesia, an arthroscopy of the right knee was performed.  Suprapatellar pouch was benign while the patellofemoral joint exhibited some grade 3 and focal grade 4 change.  A thorough chondroplasty was done along with abrasion to bleeding bone and some small areas.  Medial compartment exhibited a radial tear of the posterior horn consistent with his scan.  This required about a 15% partial medial meniscectomy in the posterior horn contouring this back to stable tissues.  He had at most some  grade 3 change focally medial.  ACL looked normal.  The lateral compartment exhibited no evidence of meniscal or articular cartilage injury.  He was irrigated and discharged home assuming he clears anesthesia and the PACU.  DESCRIPTION OF PROCEDURE:  The patient was taken to the operating suite where general anesthetic was applied without difficulty.  He was positioned supine and prepped and draped in normal sterile fashion. After the administration of preop IV vancomycin and an appropriate time- out, an arthroscopy of the right knee was performed through total of two portals.  Findings were as noted above and procedure consisted predominantly of the partial medial meniscectomy done with basket and shaver involving the posterior horn only.  We also performed the chondroplasties and abrasions as described above.  The knee was thoroughly irrigated at the end of the case followed by placement of Marcaine with epinephrine and morphine plus some Depo-Medrol.  Adaptic was placed over the portals followed by dry gauze and loose Ace wrap. Estimated blood loss and intraoperative fluids can obtained from anesthesia records.  DISPOSITION:  The patient was extubated in the operating room and taken to the recovery room in stable condition.  Plans were for him to go home same day assuming he clears the anesthesia and PACU.     Monico Blitz Rhona Raider, M.D.  PGD/MEDQ  D:  10/24/2014  T:  10/25/2014  Job:  [id number]

## 2014-11-03 DIAGNOSIS — Z9889 Other specified postprocedural states: Secondary | ICD-10-CM | POA: Diagnosis not present

## 2014-11-03 DIAGNOSIS — M2241 Chondromalacia patellae, right knee: Secondary | ICD-10-CM | POA: Diagnosis not present

## 2014-11-03 DIAGNOSIS — M25561 Pain in right knee: Secondary | ICD-10-CM | POA: Diagnosis not present

## 2014-11-03 DIAGNOSIS — R262 Difficulty in walking, not elsewhere classified: Secondary | ICD-10-CM | POA: Diagnosis not present

## 2014-11-03 DIAGNOSIS — M25661 Stiffness of right knee, not elsewhere classified: Secondary | ICD-10-CM | POA: Diagnosis not present

## 2014-11-04 ENCOUNTER — Telehealth: Payer: Self-pay | Admitting: Cardiology

## 2014-11-04 NOTE — Telephone Encounter (Signed)
New Message    Pt is needing prior auth for his blood pressure monitor Hayden Rivera is this something that you would handle?

## 2014-11-05 NOTE — Telephone Encounter (Signed)
Spoke with patient about this issue. Informed him I am unaware of any prior auth process for a BP cuff. Will ask Dr. Claris Gladden nurse if she knows any more.

## 2014-11-12 DIAGNOSIS — M25561 Pain in right knee: Secondary | ICD-10-CM | POA: Diagnosis not present

## 2014-11-12 DIAGNOSIS — M25661 Stiffness of right knee, not elsewhere classified: Secondary | ICD-10-CM | POA: Diagnosis not present

## 2014-11-12 DIAGNOSIS — R262 Difficulty in walking, not elsewhere classified: Secondary | ICD-10-CM | POA: Diagnosis not present

## 2014-11-12 DIAGNOSIS — Z9889 Other specified postprocedural states: Secondary | ICD-10-CM | POA: Diagnosis not present

## 2014-11-18 DIAGNOSIS — M25661 Stiffness of right knee, not elsewhere classified: Secondary | ICD-10-CM | POA: Diagnosis not present

## 2014-11-18 DIAGNOSIS — Z9889 Other specified postprocedural states: Secondary | ICD-10-CM | POA: Diagnosis not present

## 2014-11-18 DIAGNOSIS — M25561 Pain in right knee: Secondary | ICD-10-CM | POA: Diagnosis not present

## 2014-11-18 DIAGNOSIS — R262 Difficulty in walking, not elsewhere classified: Secondary | ICD-10-CM | POA: Diagnosis not present

## 2014-11-20 DIAGNOSIS — M25561 Pain in right knee: Secondary | ICD-10-CM | POA: Diagnosis not present

## 2014-11-20 DIAGNOSIS — M25661 Stiffness of right knee, not elsewhere classified: Secondary | ICD-10-CM | POA: Diagnosis not present

## 2014-11-20 DIAGNOSIS — Z9889 Other specified postprocedural states: Secondary | ICD-10-CM | POA: Diagnosis not present

## 2014-11-20 DIAGNOSIS — R262 Difficulty in walking, not elsewhere classified: Secondary | ICD-10-CM | POA: Diagnosis not present

## 2014-11-24 DIAGNOSIS — R262 Difficulty in walking, not elsewhere classified: Secondary | ICD-10-CM | POA: Diagnosis not present

## 2014-11-24 DIAGNOSIS — Z9889 Other specified postprocedural states: Secondary | ICD-10-CM | POA: Diagnosis not present

## 2014-11-24 DIAGNOSIS — M25561 Pain in right knee: Secondary | ICD-10-CM | POA: Diagnosis not present

## 2014-11-24 DIAGNOSIS — M25661 Stiffness of right knee, not elsewhere classified: Secondary | ICD-10-CM | POA: Diagnosis not present

## 2014-11-26 DIAGNOSIS — M25561 Pain in right knee: Secondary | ICD-10-CM | POA: Diagnosis not present

## 2014-11-26 DIAGNOSIS — Z9889 Other specified postprocedural states: Secondary | ICD-10-CM | POA: Diagnosis not present

## 2014-11-27 DIAGNOSIS — M25661 Stiffness of right knee, not elsewhere classified: Secondary | ICD-10-CM | POA: Diagnosis not present

## 2014-11-27 DIAGNOSIS — M25561 Pain in right knee: Secondary | ICD-10-CM | POA: Diagnosis not present

## 2014-11-27 DIAGNOSIS — R262 Difficulty in walking, not elsewhere classified: Secondary | ICD-10-CM | POA: Diagnosis not present

## 2014-11-27 DIAGNOSIS — Z9889 Other specified postprocedural states: Secondary | ICD-10-CM | POA: Diagnosis not present

## 2014-12-19 ENCOUNTER — Other Ambulatory Visit: Payer: Self-pay | Admitting: Cardiology

## 2014-12-22 DIAGNOSIS — Z9889 Other specified postprocedural states: Secondary | ICD-10-CM | POA: Diagnosis not present

## 2015-02-17 ENCOUNTER — Telehealth: Payer: Self-pay | Admitting: Family Medicine

## 2015-02-18 NOTE — Telephone Encounter (Signed)
Will call back when ready

## 2015-02-26 ENCOUNTER — Telehealth: Payer: Self-pay | Admitting: Family Medicine

## 2015-04-01 DIAGNOSIS — M1711 Unilateral primary osteoarthritis, right knee: Secondary | ICD-10-CM | POA: Diagnosis not present

## 2015-04-08 DIAGNOSIS — M1711 Unilateral primary osteoarthritis, right knee: Secondary | ICD-10-CM | POA: Diagnosis not present

## 2015-04-15 DIAGNOSIS — M1711 Unilateral primary osteoarthritis, right knee: Secondary | ICD-10-CM | POA: Diagnosis not present

## 2015-04-20 ENCOUNTER — Other Ambulatory Visit: Payer: Self-pay | Admitting: Cardiology

## 2015-04-28 ENCOUNTER — Telehealth: Payer: Self-pay | Admitting: Family Medicine

## 2015-04-29 DIAGNOSIS — N200 Calculus of kidney: Secondary | ICD-10-CM | POA: Diagnosis not present

## 2015-05-27 ENCOUNTER — Telehealth: Payer: Self-pay | Admitting: Family Medicine

## 2015-05-27 ENCOUNTER — Telehealth: Payer: Self-pay | Admitting: Cardiology

## 2015-05-27 NOTE — Telephone Encounter (Signed)
°  New Prob  1. What dental office are you calling from? Pt is calling in  2. What is your office phone and fax number? N/A  3. What type of procedure is the patient having performed? Tooth extraction  4. What date is procedure scheduled? 12/30 Friday   5. What is your question (ex. Antibiotics prior to procedure, holding medication-we need to know how long dentist wants pt to hold med)? Pt wants to known if he needs antibiotics prior to procedure.

## 2015-05-27 NOTE — Telephone Encounter (Signed)
Last office visit here was with B. Oxford 07-08-14.   Patient advised to call his cardiologist for opinion  about antibiotic before tooth removal.

## 2015-05-27 NOTE — Telephone Encounter (Signed)
Pt.notified

## 2015-05-27 NOTE — Telephone Encounter (Signed)
No antibiotics required.

## 2015-05-31 DIAGNOSIS — T8859XA Other complications of anesthesia, initial encounter: Secondary | ICD-10-CM

## 2015-05-31 HISTORY — DX: Other complications of anesthesia, initial encounter: T88.59XA

## 2015-06-15 ENCOUNTER — Telehealth: Payer: Self-pay | Admitting: Family Medicine

## 2015-06-16 NOTE — Telephone Encounter (Signed)
noted 

## 2015-07-02 ENCOUNTER — Encounter: Payer: Self-pay | Admitting: Cardiology

## 2015-07-02 ENCOUNTER — Ambulatory Visit (INDEPENDENT_AMBULATORY_CARE_PROVIDER_SITE_OTHER): Payer: Commercial Managed Care - HMO | Admitting: Cardiology

## 2015-07-02 VITALS — BP 142/78 | HR 66 | Ht 71.0 in | Wt 256.0 lb

## 2015-07-02 DIAGNOSIS — I10 Essential (primary) hypertension: Secondary | ICD-10-CM | POA: Diagnosis not present

## 2015-07-02 DIAGNOSIS — E785 Hyperlipidemia, unspecified: Secondary | ICD-10-CM

## 2015-07-02 DIAGNOSIS — Z9861 Coronary angioplasty status: Secondary | ICD-10-CM

## 2015-07-02 DIAGNOSIS — I2510 Atherosclerotic heart disease of native coronary artery without angina pectoris: Secondary | ICD-10-CM | POA: Diagnosis not present

## 2015-07-02 DIAGNOSIS — R0683 Snoring: Secondary | ICD-10-CM | POA: Diagnosis not present

## 2015-07-02 MED ORDER — LOSARTAN POTASSIUM 100 MG PO TABS: 100.0000 mg | ORAL_TABLET | Freq: Every day | ORAL | Status: DC

## 2015-07-02 NOTE — Patient Instructions (Signed)
Medication Instructions:  Increase losartan to 100mg  daily. You can take 2 of your 50mg  tablets daily at the same time and use your current supply.   Labwork: Your physician recommends that you return for a FASTING lipid profile /BMET in 2 weeks.    Testing/Procedures: Your physician has recommended that you have a sleep study. This test records several body functions during sleep, including: brain activity, eye movement, oxygen and carbon dioxide blood levels, heart rate and rhythm, breathing rate and rhythm, the flow of air through your mouth and nose, snoring, body muscle movements, and chest and belly movement.    Follow-Up: Your physician wants you to follow-up in: 1 year with Dr Aundra Dubin. (February 2018).  You will receive a reminder letter in the mail two months in advance. If you don't receive a letter, please call our office to schedule the follow-up appointment.        If you need a refill on your cardiac medications before your next appointment, please call your pharmacy.

## 2015-07-02 NOTE — Progress Notes (Signed)
Patient ID: Hayden Rivera, male   DOB: Oct 20, 1950, 65 y.o.   MRN: [id number] PCP: Dr Laurance Flatten  65 yo with history of CAD presents for cardiology followup.  In 5/14, patient was admitted with unstable angina.  He had angioplasty to in-stent restenosis in the mid LAD and had DES to LCx.  The procedure was complicated by probable distal embolization with peri-procedural infarction.  EF was preserved on echo done 5/14.  After discharge, he was seen in the office with significant dyspnea.  Brilinta was stopped and he was started on Effient.  The dyspnea resolved.  Metoprolol was stopped due to bradycardia.  He had a Cardiolite in 4/16 that was read as "high risk" with anterior and apical scar and inferior/inferolateral scar + prominent peri-infarct ischemia and EF 49%.  Echo showed EF 50% with wall motion abnormalities. LHC was done, showing no tight stenoses and patent stents.     No recent chest pain.  Still has right knee pain.  He plays golf.  Mild dyspnea walking up a steep hill.  +Generalized fatigue.  Snores at night with daytime sleepiness.    Labs (6/14): K 4.3, creatinine 0.9, BNP 44 Labs (11/14): K 3.8, creatinine 0.9, LDL 62, HDL 51 Labs (5/15): creatinine 1.01 Labs (10/15): LDL 59, HDL 43 Labs (4/16): K 3.8, creatinine 0.81 Labs (5/16): K 3.7, creatinine 0.94, HCT 43.6  ECG: NSR, septal Qs, low voltage  PMH: 1. CAD: Anterior STEMI in [id number] complicated by ventricular fibrillation arrest.  PCI to LAD at [facility].  Stent thrombosis in 2006, had DES to LAD in [place].  5/14 had balloon angioplasty to mid LAD (in-stent restenosis) and DES to LCx for unstable angina.  Echo (5/14) with EF 60%, no regional wall motion abnormalities, aortic sclerosis without stenosis. Lexiscan Cardiolite (4/16) with EF 49%, small apical anterior/apex scar, large basal-mid inferoseptal/inferior/inferolateral scar + prominent peri-infarct ischemia (read as high risk).  Echo (4/16) with EF 50%, mid to apical anteroseptal  akinesis, mid to apical inferior akinesis, very mild aortic stenosis.  LHC (5/16) with 35% LM stenosis, serial 30% RCA stenoses, serial 30% and 40% proximal LAD stenoses, 50% OM1 stenosis.  2. 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  3. Hyperlipidemia. 4. Dyspnea with Brilinta.  5. HTN 6. Palpitations: Holter (7/14) occasional PVCs and PACs.    7. Aortic stenosis: Very mild on 4/16 echo.  8. OA knee  SH: Retired, prior smoker, lives in [place].   FH: CAD  ROS: All systems reviewed and negative except as per HPI.   Current Outpatient Prescriptions  Medication Sig Dispense Refill  . acetaminophen (TYLENOL) 500 MG tablet Take 500 mg by mouth every 6 (six) hours as needed (PAIN).     Marland Kitchen aspirin 81 MG tablet Take 1 tablet (81 mg total) by mouth daily.    . cholecalciferol (VITAMIN D) 1000 UNITS tablet Take 2,000 Units by mouth daily.     . clopidogrel (PLAVIX) 75 MG tablet Take 75 mg by mouth daily. Reported on 07/02/2015    . isosorbide mononitrate (IMDUR) 30 MG 24 hr tablet Take 1 tablet (30 mg total) by mouth daily. 90 tablet 3  . Misc Natural Products (OSTEO BI-FLEX TRIPLE STRENGTH PO) Take 1 tablet by mouth 2 (two) times daily.    . nitroGLYCERIN (NITROSTAT) 0.4 MG SL tablet PLACE 1 TABLET UNDER THE TONGUE EVERY 5 MINUTES UP TO 3 DOSES AS NEEDED FOR CHEST PAIN 100 tablet 3  . Omega-3 Fatty Acids (FISH  OIL) 1200 MG CAPS Take 1 capsule by mouth 2 (two) times daily.    . rosuvastatin (CRESTOR) 10 MG tablet Take 1 tablet (10 mg total) by mouth daily. 90 tablet 0  . losartan (COZAAR) 100 MG tablet Take 1 tablet (100 mg total) by mouth daily. 90 tablet 0   No current facility-administered medications for this visit.    BP 142/78 mmHg  Pulse 66  Ht 5\' 11"  (1.803 m)  Wt 256 lb (116.121 kg)  BMI 35.72 kg/m2 General: NAD, obese Neck: Thick, no JVD, no thyromegaly or thyroid nodule.  Lungs: CTAB  CV: Nondisplaced PMI.  Heart regular S1/S2, no S3/S4, 2/6 early  SEM RUSB.  No edema.  No carotid bruit. Normal pedal pulses.  Abdomen: Soft, nontender, no hepatosplenomegaly, no distention.  Skin: Intact without lesions or rashes.  Neurologic: Alert and oriented x 3.  Psych: Normal affect. Extremities: No clubbing or cyanosis.   Assessment/Plan: 1. CAD: Patient had Cardiolite in 4/16 concerning for possible inferior/inferolateral ischemia. EF was mildly depressed by echo and by Cardiolite.  I took him for [facility] in 5/16.  There was no tight stenosis.  I suspect the Cardiolite abnormality was due to prior MI.  No further chest pain.  - Continue Plavix long-term given history of stent thrombosis. - Continue ASA 81, Imdur, ARB, statin.  - No beta blocker with bradycardia while on low dose metoprolol.  2. Palpitations: PVCs and PACs on holter in 7/14.  These seem to have resolved.  3. HTN: BP has been running high.  I will have him increase losartan to 100 mg daily.  BMET in 2 wks.   4. Hyperlipidemia: I will arrange to check fasting lipids.  5. Suspect OSA: I will arrange for sleep study.    Loralie Champagne 07/02/2015

## 2015-07-16 ENCOUNTER — Other Ambulatory Visit (INDEPENDENT_AMBULATORY_CARE_PROVIDER_SITE_OTHER): Payer: Commercial Managed Care - HMO | Admitting: *Deleted

## 2015-07-16 DIAGNOSIS — R0683 Snoring: Secondary | ICD-10-CM | POA: Diagnosis not present

## 2015-07-16 DIAGNOSIS — E785 Hyperlipidemia, unspecified: Secondary | ICD-10-CM

## 2015-07-16 DIAGNOSIS — I2510 Atherosclerotic heart disease of native coronary artery without angina pectoris: Secondary | ICD-10-CM

## 2015-07-16 DIAGNOSIS — I10 Essential (primary) hypertension: Secondary | ICD-10-CM | POA: Diagnosis not present

## 2015-07-16 LAB — LIPID PANEL
Cholesterol: 136 mg/dL (ref 125–200)
HDL: 40 mg/dL (ref >=40)
LDL Cholesterol: 72 mg/dL (ref <130)
TRIGLYCERIDES: 118 mg/dL (ref <150)
Total CHOL/HDL Ratio: 3.4 Ratio (ref <=5.0)
VLDL: 24 mg/dL (ref <30)

## 2015-07-16 LAB — BASIC METABOLIC PANEL
BUN: 16 mg/dL (ref 7–25)
CHLORIDE: 106 mmol/L (ref 98–110)
CO2: 20 mmol/L (ref 20–31)
CREATININE: 1.05 mg/dL (ref 0.70–1.25)
Calcium: 9.2 mg/dL (ref 8.6–10.3)
Glucose, Bld: 124 mg/dL — ABNORMAL HIGH (ref 65–99)
Potassium: 3.9 mmol/L (ref 3.5–5.3)
SODIUM: 139 mmol/L (ref 135–146)

## 2015-09-21 ENCOUNTER — Ambulatory Visit (HOSPITAL_BASED_OUTPATIENT_CLINIC_OR_DEPARTMENT_OTHER): Payer: Commercial Managed Care - HMO | Attending: Cardiology | Admitting: Cardiology

## 2015-09-21 DIAGNOSIS — Z7982 Long term (current) use of aspirin: Secondary | ICD-10-CM | POA: Insufficient documentation

## 2015-09-21 DIAGNOSIS — Z79899 Other long term (current) drug therapy: Secondary | ICD-10-CM | POA: Insufficient documentation

## 2015-09-21 DIAGNOSIS — I493 Ventricular premature depolarization: Secondary | ICD-10-CM | POA: Diagnosis not present

## 2015-09-21 DIAGNOSIS — I2510 Atherosclerotic heart disease of native coronary artery without angina pectoris: Secondary | ICD-10-CM

## 2015-09-21 DIAGNOSIS — I10 Essential (primary) hypertension: Secondary | ICD-10-CM | POA: Diagnosis not present

## 2015-09-21 DIAGNOSIS — R0683 Snoring: Secondary | ICD-10-CM

## 2015-09-21 HISTORY — PX: SPLIT NIGHT STUDY: SLE1000

## 2015-09-22 ENCOUNTER — Telehealth: Payer: Self-pay | Admitting: Cardiology

## 2015-09-22 ENCOUNTER — Encounter (HOSPITAL_BASED_OUTPATIENT_CLINIC_OR_DEPARTMENT_OTHER): Payer: Self-pay | Admitting: Cardiology

## 2015-09-22 DIAGNOSIS — R0683 Snoring: Secondary | ICD-10-CM | POA: Insufficient documentation

## 2015-09-22 NOTE — Telephone Encounter (Signed)
Please let patient know that sleep study showed no significant sleep apnea.    

## 2015-09-22 NOTE — Procedures (Signed)
   Patient Name: Hayden Rivera, Hayden Rivera MRN: [id number] Study Date: 09/21/2015 Gender: Male D.O.B: [birth year] Age (years): 36 Referring Provider: Loralie Champagne Interpreting Physician: Fransico Him MD, ABSM RPSGT: Madelon Lips  Weight (lbs): 250 BMI: 35 Height (inches): 71 Neck Size: 19.00  CLINICAL INFORMATION Sleep Study Type: NPSG Indication for sleep study: Snoring Epworth Sleepiness Score: 5  SLEEP STUDY TECHNIQUE As per the AASM Manual for the Scoring of Sleep and Associated Events v2.3 (April 2016) with a hypopnea requiring 4% desaturations. The channels recorded and monitored were frontal, central and occipital EEG, electrooculogram (EOG), submentalis EMG (chin), nasal and oral airflow, thoracic and abdominal wall motion, anterior tibialis EMG, snore microphone, electrocardiogram, and pulse oximetry.  MEDICATIONS Patient's medications include: ASA, Vit D, Plavix, Imdur, Losartan, Fish Oil, Crestor.   Medications self-administered by patient during sleep study : No sleep medicine administered.  SLEEP ARCHITECTURE The study was initiated at 10:16:00 PM and ended at 4:33:25 AM. Sleep onset time was 43.0 minutes and the sleep efficiency was reduced at 63.6%. The total sleep time was 240.0 minutes. Stage REM latency was 73.5 minutes. The patient spent 6.88% of the night in stage N1 sleep, 74.80% in stage N2 sleep, 0.00% in stage N3 and 18.32% in REM. Alpha intrusion was absent. Supine sleep was 31.26%.  RESPIRATORY PARAMETERS The overall apnea/hypopnea index (AHI) was 1.5 per hour. There were 1 total apneas, including 0 obstructive, 1 central and 0 mixed apneas. There were 5 hypopneas and 12 RERAs. The AHI during Stage REM sleep was 6.8 per hour. AHI while supine was 0.8 per hour. The mean oxygen saturation was 92.81%. The minimum SpO2 during sleep was 89.00%. Loud snoring was noted during this study.  CARDIAC DATA The 2 lead EKG demonstrated sinus rhythm. The mean heart rate  was 57.02 beats per minute. Other EKG findings include: PVCs.  LEG MOVEMENT DATA The total PLMS were 0 with a resulting PLMS index of 0.00. Associated arousal with leg movement index was 0.0 .  IMPRESSIONS - No significant obstructive sleep apnea occurred during this study (AHI = 1.5/h). - No significant central sleep apnea occurred during this study (CAI = 0.3/h). - The patient had minimal or no oxygen desaturation during the study (Min O2 = 89.00%) - The patient snored with Loud snoring volume. - EKG findings include PVCs. - Clinically significant periodic limb movements did not occur during sleep. No significant associated arousals.  DIAGNOSIS - Snoring  RECOMMENDATIONS - Avoid alcohol, sedatives and other CNS depressants that may result in sleep apnea and disrupt normal sleep architecture. - Sleep hygiene should be reviewed to assess factors that may improve sleep quality. - Weight management and regular exercise should be initiated or continued if appropriate. - Consider referral to ENT for evaluation of surgical causes of snoring.   [place], American Board of Sleep Medicine  ELECTRONICALLY SIGNED ON:  09/22/2015, 9:02 PM [place] PH: [phone]   FX: [phone] [place]

## 2015-09-24 NOTE — Telephone Encounter (Signed)
Per DPR,  Detailed message left on patient's voicemail. My name and number was left if he had any further questions.

## 2015-10-03 ENCOUNTER — Other Ambulatory Visit: Payer: Self-pay | Admitting: Cardiology

## 2015-11-05 ENCOUNTER — Other Ambulatory Visit: Payer: Self-pay | Admitting: Cardiology

## 2015-12-22 ENCOUNTER — Other Ambulatory Visit: Payer: Self-pay | Admitting: Cardiology

## 2015-12-22 NOTE — Telephone Encounter (Signed)
Rx request sent to pharmacy.  

## 2016-02-10 ENCOUNTER — Encounter: Payer: Self-pay | Admitting: Physician Assistant

## 2016-02-11 NOTE — Progress Notes (Signed)
Cardiology Office Note:    Date:  02/11/2016   ID:  Hayden Rivera, DOB 07/04/1950, MRN [id number]  PCP:  Kenn File, MD  Cardiologist:  Dr. Loralie Champagne   Electrophysiologist:  n/a  Referring MD: Timmothy Euler, MD   No chief complaint on file.   History of Present Illness:    Hayden Rivera is a 65 y.o. male with a hx of CAD status post prior stenting to the LAD, angioplasty to the LAD in 5/14 secondary to ISR and DES to the LCx (c/b peri-procedure infarct 2/2 distal embolization).  He is not on beta-blocker 2/2 bradycardia.  He was intolerant of Brilinta 2/2 to dyspnea.  Last cardiac catheterization in 5/16 demonstrated patent stents in LAD and LCx. He had moderate nonobstructive disease elsewhere. Medical therapy was recommended. Last see by Dr. Loralie Champagne in 2/17.   He presents today with his wife for evaluation of chest discomfort. Over the past 2-3 months, he has noted exertional chest burning. This typically occurs with walking his dog. It will start after about 100 yards. Symptoms resolve with rest. He's also noted some vague jaw discomfort. The jaw discomfort reminds him of his previous angina. He has not taken nitroglycerin. He denies associated nausea, diaphoresis or dyspnea. He has also noted recent indigestion with meals. This is fairly frequent. He denies orthopnea, PND or edema. Denies syncope.  Prior CV studies that were reviewed today include:    LHC 10/02/14 Left Main  35%   distal   Left Anterior Descending Prox 40% prior to stent, stent patent with 30% ISR Left Circumflex Mid 20%, stent is patent.  Ostial OM1 50% Right Coronary Artery Prox 30% and 30%  Stents patent.  The most significant lesion present was a 50% ostial stenosis in a high OM1.  I think that the Cardiolite defect was likely reflective of a prior MI.  He should be stable for knee surgery.   Myoview 09/17/14 Overall Impression:  High risk stress nuclear study with two defects: 1. There is  a small scar in the distal anterior wall and in the apex. 2. There is a large scar in the basal and mid inferoseptal, inferior and inferolateral walls with medium size, moderate severity  peri-infarct ischemia in the basal and mid inferolateral and basal anterolateral walls.  LV Ejection Fraction: 49%.  LV Wall Motion:  Paradoxical septal motion, akinesis in the basal inferior and inferoseptal walls, apical anterior walls.   Echo 09/16/14 EF 50-55%, anteroseptal AK, inferior AK, grade 2 diastolic dysfunction, mild aortic stenosis mean gradient 10 mmHg   Past Medical History:  Diagnosis Date  . Arthritis   . CAD (coronary artery disease)    a. Anterior STEMI 2005 c/b vfib arrest, PCI to LAD at [facility]. b. Stent thrombosis 2006 with DES within prior LAD stent at [facility]. C. 09/2012: s/p balloon angioplasty to mLAD for severe stenosis in previously stented segment & DES to LCx; initial enz neg but ruled in for NSTEMI after post-cath vagal sx, felt d/t distal emboliz of thrombus during case.  . Elevated hemidiaphragm    a. Noted 09/2012 - instructed to f/u PCP.  . Ischemic cardiomyopathy    a. Unclear prior EF but pt was told heart was weakened in past. b. EF normal 09/2012.  Marland Kitchen Kidney stones   . Myocardial infarction ([place])   . Shortness of breath dyspnea   . Ventricular fibrillation ([place])    a. VF arrest 2005 in setting of STEMI.  Marland Kitchen  Wears glasses   1. CAD: Anterior STEMI in [id number] complicated by ventricular fibrillation arrest.  PCI to LAD at [facility].  Stent thrombosis in 2006, had DES to LAD in [place].  5/14 had balloon angioplasty to mid LAD (in-stent restenosis) and DES to LCx for unstable angina.  Echo (5/14) with EF 60%, no regional wall motion abnormalities, aortic sclerosis without stenosis. Lexiscan Cardiolite (4/16) with EF 49%, small apical anterior/apex scar, large basal-mid inferoseptal/inferior/inferolateral scar + prominent peri-infarct ischemia (read as high risk).  Echo (4/16) with EF  50%, mid to apical anteroseptal akinesis, mid to apical inferior akinesis, very mild aortic stenosis.  LHC (5/16) with 35% LM stenosis, serial 30% RCA stenoses, serial 30% and 40% proximal LAD stenoses, 50% OM1 stenosis.  2. 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  3. Hyperlipidemia. 4. Dyspnea with Brilinta.  5. HTN 6. Palpitations: Holter (7/14) occasional PVCs and PACs.    7. Aortic stenosis: Very mild on 4/16 echo.  8. OA knee  Past Surgical History:  Procedure Laterality Date  . BACK SURGERY    . CARDIAC CATHETERIZATION     stents x2  . CARDIAC CATHETERIZATION N/A 10/02/2014   Procedure: Left Heart Cath And Coronary Angiography;  Surgeon: Larey Dresser, MD;  Location: [facility] INVASIVE CV LAB CUPID;  Service: Cardiovascular;  Laterality: N/A;  . CORONARY STENT PLACEMENT    . HAND SURGERY    . KNEE ARTHROSCOPY Right 10/24/2014   Procedure: ARTHROSCOPY RIGHT KNEE, Partial medial menisectomy and chondroplasty;  Surgeon: Melrose Nakayama, MD;  Location: [facility];  Service: Orthopedics;  Laterality: Right;  . LEFT HEART CATH N/A 10/11/2012   Procedure: LEFT HEART CATH;  Surgeon: Sherren Mocha, MD;  Location: [facility] CATH LAB;  Service: Cardiovascular;  Laterality: N/A;  . LITHOTRIPSY    . SPLIT NIGHT STUDY  09/21/2015  . TONSILLECTOMY      Current Medications: Outpatient Medications Prior to Visit  Medication Sig Dispense Refill  . acetaminophen (TYLENOL) 500 MG tablet Take 500 mg by mouth every 6 (six) hours as needed (PAIN).     Marland Kitchen aspirin 81 MG tablet Take 1 tablet (81 mg total) by mouth daily.    . cholecalciferol (VITAMIN D) 1000 UNITS tablet Take 2,000 Units by mouth daily.     . clopidogrel (PLAVIX) 75 MG tablet TAKE 1 TABLET EVERY DAY 90 tablet 2  . isosorbide mononitrate (IMDUR) 30 MG 24 hr tablet Take 1 tablet (30 mg total) by mouth daily. 90 tablet 2  . losartan (COZAAR) 100 MG tablet TAKE 1 TABLET EVERY DAY 90 tablet 3  . Misc Natural Products (OSTEO BI-FLEX  TRIPLE STRENGTH PO) Take 1 tablet by mouth 2 (two) times daily.    . nitroGLYCERIN (NITROSTAT) 0.4 MG SL tablet PLACE 1 TABLET UNDER THE TONGUE EVERY 5 MINUTES UP TO 3 DOSES AS NEEDED FOR CHEST PAIN 100 tablet 3  . Omega-3 Fatty Acids (FISH OIL) 1200 MG CAPS Take 1 capsule by mouth 2 (two) times daily.    . rosuvastatin (CRESTOR) 10 MG tablet Take 1 tablet (10 mg total) by mouth daily. 90 tablet 0   No facility-administered medications prior to visit.       Allergies:   Altace [ramipril] and Penicillins   Social History   Social History  . Marital status: Married    Spouse name: N/A  . Number of children: N/A  . Years of education: N/A   Occupational History  . retired    Science writer  History Main Topics  . Smoking status: Former Smoker    Packs/day: 0.50    Years: 10.00    Types: Cigars, Cigarettes    Quit date: 05/30/2002  . Smokeless tobacco: Never Used  . Alcohol use No  . Drug use: No  . Sexual activity: Not Currently   Other Topics Concern  . Not on file   Social History Narrative  . No narrative on file     Family History:  The patient's family history includes Healthy in his brother; Heart disease in his mother; Stroke in his father.   ROS:   Please see the history of present illness.    Review of Systems  Cardiovascular: Positive for chest pain, dyspnea on exertion and irregular heartbeat.   All other systems reviewed and are negative.   EKGs/Labs/Other Test Reviewed:    EKG:  EKG is   ordered today.  The ekg ordered today demonstrates Sinus bradycardia, HR 53, LAD, low voltage, QTc 412 ms, no significant ST changes, similar to prior tracing dated 07/02/15  Recent Labs: 07/16/2015: BUN 16; Creat 1.05; Potassium 3.9; Sodium 139   Recent Lipid Panel    Component Value Date/Time   CHOL 136 07/16/2015 0843   TRIG 118 07/16/2015 0843   HDL 40 07/16/2015 0843   CHOLHDL 3.4 07/16/2015 0843   VLDL 24 07/16/2015 0843   LDLCALC 72 07/16/2015 0843     Physical  Exam:    VS:  There were no vitals taken for this visit.    Wt Readings from Last 3 Encounters:  09/21/15 250 lb (113.4 kg)  07/02/15 256 lb (116.1 kg)  10/24/14 259 lb (117.5 kg)     Physical Exam  Constitutional: He is oriented to person, place, and time. He appears well-developed and well-nourished. No distress.  HENT:  Head: Normocephalic and atraumatic.  Eyes: No scleral icterus.  Neck: Normal range of motion. No JVD present.  Cardiovascular: Normal rate, regular rhythm, S1 normal and S2 normal.  Exam reveals no gallop and no friction rub.   Murmur heard.  Harsh systolic murmur is present with a grade of 2/6  at the upper right sternal border Pulmonary/Chest: Effort normal and breath sounds normal. He has no wheezes. He has no rhonchi. He has no rales.  Abdominal: Soft. There is no tenderness.  Musculoskeletal: He exhibits no edema.  Neurological: He is alert and oriented to person, place, and time.  Skin: Skin is warm and dry.  Psychiatric: He has a normal mood and affect.    ASSESSMENT:    1. Other chest pain   2. CAD S/P percutaneous coronary angioplasty/DES   3. Essential hypertension   4. Hyperlipidemia    PLAN:    In order of problems listed above:  1. CAD - Hx of Anterior STEMI c/b VF arrest in 2005 with PCI to LAD, DES to LAD in 2006 2/2 stent thrombosis [facility]), angioplasty to LAD in 2014 2/2/ ISR and DES to LCx in 2014 for [place].  Last LHC in 5/16 demonstrated patent stents and mild to mod non-obstructive CAD elsewhere.  He now presents with complaints of chest discomfort that are consistent with CCS class III angina. Stress testing would likely be abnormal given his previous nuclear study in 2016. I reviewed his case with Dr. Aundra Dubin by phone. He recommended proceeding with cardiac catheterization.  Risks and benefits of cardiac catheterization have been discussed with the patient.  These include bleeding, infection, kidney damage, stroke, heart attack, death.  The patient understands these risks and is willing to proceed.   -  LHC with Dr. Loralie Champagne next week  -  Increase Isosorbide to 60 mg QD  2. HTN - Controlled.  3. HL - Continue statin.  4. GERD - He also has symptoms of indigestion. Add Protonix 40 mg QD.    Medication Adjustments/Labs and Tests Ordered: Current medicines are reviewed at length with the patient today.  Concerns regarding medicines are outlined above.  Medication changes, Labs and Tests ordered today are outlined in the Patient Instructions noted below. There are no Patient Instructions on file for this visit. Signed, Richardson Dopp, PA-C  02/11/2016 4:47 PM    [facility] [address], [place], [place]  [address] Phone: [phone]; Fax: [phone]

## 2016-02-12 ENCOUNTER — Encounter (INDEPENDENT_AMBULATORY_CARE_PROVIDER_SITE_OTHER): Payer: Self-pay

## 2016-02-12 ENCOUNTER — Ambulatory Visit (INDEPENDENT_AMBULATORY_CARE_PROVIDER_SITE_OTHER): Payer: Commercial Managed Care - HMO | Admitting: Physician Assistant

## 2016-02-12 ENCOUNTER — Telehealth: Payer: Self-pay | Admitting: *Deleted

## 2016-02-12 ENCOUNTER — Encounter: Payer: Self-pay | Admitting: *Deleted

## 2016-02-12 ENCOUNTER — Encounter: Payer: Self-pay | Admitting: Physician Assistant

## 2016-02-12 VITALS — BP 124/80 | HR 53 | Ht 71.0 in | Wt 254.1 lb

## 2016-02-12 DIAGNOSIS — E785 Hyperlipidemia, unspecified: Secondary | ICD-10-CM

## 2016-02-12 DIAGNOSIS — I10 Essential (primary) hypertension: Secondary | ICD-10-CM | POA: Diagnosis not present

## 2016-02-12 DIAGNOSIS — I25119 Atherosclerotic heart disease of native coronary artery with unspecified angina pectoris: Secondary | ICD-10-CM

## 2016-02-12 LAB — CBC WITH DIFFERENTIAL/PLATELET
BASOS ABS: 96 {cells}/uL (ref 0–200)
Basophils Relative: 1 %
Eosinophils Absolute: 288 cells/uL (ref 15–500)
Eosinophils Relative: 3 %
HCT: 43.3 % (ref 38.5–50.0)
Hemoglobin: 14.7 g/dL (ref 13.2–17.1)
Lymphocytes Relative: 39 %
Lymphs Abs: 3744 cells/uL (ref 850–3900)
MCH: 33 pg (ref 27.0–33.0)
MCHC: 33.9 g/dL (ref 32.0–36.0)
MCV: 97.3 fL (ref 80.0–100.0)
MONO ABS: 768 {cells}/uL (ref 200–950)
MPV: 11.8 fL (ref 7.5–12.5)
Monocytes Relative: 8 %
NEUTROS PCT: 49 %
Neutro Abs: 4704 cells/uL (ref 1500–7800)
Platelets: 176 10*3/uL (ref 140–400)
RBC: 4.45 MIL/uL (ref 4.20–5.80)
RDW: 13.3 % (ref 11.0–15.0)
WBC: 9.6 10*3/uL (ref 3.8–10.8)

## 2016-02-12 LAB — BASIC METABOLIC PANEL
BUN: 16 mg/dL (ref 7–25)
CO2: 22 mmol/L (ref 20–31)
Calcium: 9.6 mg/dL (ref 8.6–10.3)
Chloride: 106 mmol/L (ref 98–110)
Creat: 1 mg/dL (ref 0.70–1.25)
GLUCOSE: 106 mg/dL — AB (ref 65–99)
POTASSIUM: 4.1 mmol/L (ref 3.5–5.3)
Sodium: 140 mmol/L (ref 135–146)

## 2016-02-12 LAB — PROTIME-INR
INR: 1.2 — ABNORMAL HIGH
Prothrombin Time: 12.5 s — ABNORMAL HIGH (ref 9.0–11.5)

## 2016-02-12 MED ORDER — ROSUVASTATIN CALCIUM 10 MG PO TABS: 10.0000 mg | ORAL_TABLET | Freq: Every day | ORAL | 3 refills | Status: DC

## 2016-02-12 MED ORDER — PANTOPRAZOLE SODIUM 40 MG PO TBEC: 40.0000 mg | DELAYED_RELEASE_TABLET | Freq: Every day | ORAL | 6 refills | Status: DC

## 2016-02-12 MED ORDER — ISOSORBIDE MONONITRATE ER 60 MG PO TB24: 60.0000 mg | ORAL_TABLET | Freq: Every day | ORAL | 3 refills | Status: DC

## 2016-02-12 NOTE — Telephone Encounter (Signed)
Pt notified of lab results and ok to proceed with cath 9/20. Pt verbalized understanding to plan of care

## 2016-02-12 NOTE — Patient Instructions (Addendum)
Medication Instructions:  1. INCREASE IMDUR TO 60 MG DAILY; NEW RX FOR THE 60 MG TABLET HAS BEEN SENT IN 2. START  PROTONIX 40 MG DAILY; NEW RX HAS BEEN SENT IN  Labwork: 1. TODAY BMET, CBC W/DIFF, PT/INR  Testing/Procedures: Your physician has requested that you have a cardiac catheterization. Cardiac catheterization is used to diagnose and/or treat various heart conditions. Doctors may recommend this procedure for a number of different reasons. The most common reason is to evaluate chest pain. Chest pain can be a symptom of coronary artery disease (CAD), and cardiac catheterization can show whether plaque is narrowing or blocking your heart's arteries. This procedure is also used to evaluate the valves, as well as measure the blood flow and oxygen levels in different parts of your heart. For further information please visit [email or web address]. Please follow instruction sheet, as given.  Follow-Up: 03/01/16 @ 8:15 WITH SCOTT WEAVER, PAC   Any Other Special Instructions Will Be Listed Below (If Applicable).  If you need a refill on your cardiac medications before your next appointment, please call your pharmacy.

## 2016-02-17 ENCOUNTER — Encounter (HOSPITAL_COMMUNITY): Payer: Self-pay | Admitting: *Deleted

## 2016-02-17 ENCOUNTER — Other Ambulatory Visit: Payer: Self-pay | Admitting: Physician Assistant

## 2016-02-17 ENCOUNTER — Encounter (HOSPITAL_COMMUNITY)
Admission: RE | Disposition: A | Discharge status: Home / Self Care | Payer: Self-pay | Source: Ambulatory Visit | Attending: Cardiology

## 2016-02-17 ENCOUNTER — Ambulatory Visit (HOSPITAL_COMMUNITY)
Admission: RE | Admit: 2016-02-17 | Discharge: 2016-02-17 | Disposition: A | Discharge status: Home / Self Care | Payer: Commercial Managed Care - HMO | Source: Ambulatory Visit | Attending: Cardiology | Admitting: Cardiology

## 2016-02-17 DIAGNOSIS — R0789 Other chest pain: Secondary | ICD-10-CM | POA: Insufficient documentation

## 2016-02-17 DIAGNOSIS — Y712 Prosthetic and other implants, materials and accessory cardiovascular devices associated with adverse incidents: Secondary | ICD-10-CM | POA: Insufficient documentation

## 2016-02-17 DIAGNOSIS — I2510 Atherosclerotic heart disease of native coronary artery without angina pectoris: Secondary | ICD-10-CM | POA: Diagnosis not present

## 2016-02-17 DIAGNOSIS — Z8249 Family history of ischemic heart disease and other diseases of the circulatory system: Secondary | ICD-10-CM | POA: Insufficient documentation

## 2016-02-17 DIAGNOSIS — I2584 Coronary atherosclerosis due to calcified coronary lesion: Secondary | ICD-10-CM | POA: Insufficient documentation

## 2016-02-17 DIAGNOSIS — I255 Ischemic cardiomyopathy: Secondary | ICD-10-CM | POA: Diagnosis not present

## 2016-02-17 DIAGNOSIS — Z7982 Long term (current) use of aspirin: Secondary | ICD-10-CM | POA: Diagnosis not present

## 2016-02-17 DIAGNOSIS — M179 Osteoarthritis of knee, unspecified: Secondary | ICD-10-CM | POA: Diagnosis not present

## 2016-02-17 DIAGNOSIS — Z823 Family history of stroke: Secondary | ICD-10-CM | POA: Diagnosis not present

## 2016-02-17 DIAGNOSIS — Z7902 Long term (current) use of antithrombotics/antiplatelets: Secondary | ICD-10-CM | POA: Insufficient documentation

## 2016-02-17 DIAGNOSIS — E785 Hyperlipidemia, unspecified: Secondary | ICD-10-CM | POA: Diagnosis not present

## 2016-02-17 DIAGNOSIS — K219 Gastro-esophageal reflux disease without esophagitis: Secondary | ICD-10-CM | POA: Insufficient documentation

## 2016-02-17 DIAGNOSIS — I252 Old myocardial infarction: Secondary | ICD-10-CM | POA: Insufficient documentation

## 2016-02-17 DIAGNOSIS — Z87891 Personal history of nicotine dependence: Secondary | ICD-10-CM | POA: Insufficient documentation

## 2016-02-17 DIAGNOSIS — I700 Atherosclerosis of aorta: Secondary | ICD-10-CM | POA: Insufficient documentation

## 2016-02-17 DIAGNOSIS — I25119 Atherosclerotic heart disease of native coronary artery with unspecified angina pectoris: Secondary | ICD-10-CM

## 2016-02-17 DIAGNOSIS — T82855A Stenosis of coronary artery stent, initial encounter: Secondary | ICD-10-CM | POA: Diagnosis not present

## 2016-02-17 DIAGNOSIS — Z87442 Personal history of urinary calculi: Secondary | ICD-10-CM | POA: Insufficient documentation

## 2016-02-17 DIAGNOSIS — I4901 Ventricular fibrillation: Secondary | ICD-10-CM | POA: Insufficient documentation

## 2016-02-17 HISTORY — PX: CARDIAC CATHETERIZATION: SHX172

## 2016-02-17 SURGERY — LEFT HEART CATH AND CORONARY ANGIOGRAPHY
Anesthesia: LOCAL

## 2016-02-17 MED ORDER — FENTANYL CITRATE (PF) 100 MCG/2ML IJ SOLN
INTRAMUSCULAR | Status: AC
  Filled 2016-02-17: qty 2

## 2016-02-17 MED ORDER — HEPARIN (PORCINE) IN NACL 2-0.9 UNIT/ML-% IJ SOLN
INTRAMUSCULAR | Status: AC
  Filled 2016-02-17: qty 1000

## 2016-02-17 MED ORDER — SODIUM CHLORIDE 0.9 % IV SOLN: 250.0000 mL | INTRAVENOUS | Status: DC | PRN

## 2016-02-17 MED ORDER — HEPARIN SODIUM (PORCINE) 1000 UNIT/ML IJ SOLN
INTRAMUSCULAR | Status: AC
  Filled 2016-02-17: qty 1

## 2016-02-17 MED ORDER — MIDAZOLAM HCL 2 MG/2ML IJ SOLN
INTRAMUSCULAR | Status: DC | PRN
  Administered 2016-02-17: 1 mg via INTRAVENOUS

## 2016-02-17 MED ORDER — LIDOCAINE HCL (PF) 1 % IJ SOLN
INTRAMUSCULAR | Status: DC | PRN
  Administered 2016-02-17: 2 mL

## 2016-02-17 MED ORDER — SODIUM CHLORIDE 0.9 % WEIGHT BASED INFUSION: 1.0000 mL/kg/h | INTRAVENOUS | Status: DC

## 2016-02-17 MED ORDER — MIDAZOLAM HCL 2 MG/2ML IJ SOLN
INTRAMUSCULAR | Status: AC
  Filled 2016-02-17: qty 2

## 2016-02-17 MED ORDER — VERAPAMIL HCL 2.5 MG/ML IV SOLN
INTRAVENOUS | Status: AC
  Filled 2016-02-17: qty 2

## 2016-02-17 MED ORDER — IOPAMIDOL (ISOVUE-370) INJECTION 76%
INTRAVENOUS | Status: AC
  Filled 2016-02-17: qty 100

## 2016-02-17 MED ORDER — LIDOCAINE HCL (PF) 1 % IJ SOLN
INTRAMUSCULAR | Status: AC
  Filled 2016-02-17: qty 30

## 2016-02-17 MED ORDER — ONDANSETRON HCL 4 MG/2ML IJ SOLN: 4.0000 mg | Freq: Four times a day (QID) | INTRAMUSCULAR | Status: DC | PRN

## 2016-02-17 MED ORDER — ACETAMINOPHEN 325 MG PO TABS: 650.0000 mg | ORAL_TABLET | ORAL | Status: DC | PRN

## 2016-02-17 MED ORDER — SODIUM CHLORIDE 0.9% FLUSH: 3.0000 mL | INTRAVENOUS | Status: DC | PRN

## 2016-02-17 MED ORDER — SODIUM CHLORIDE 0.9% FLUSH: 3.0000 mL | Freq: Two times a day (BID) | INTRAVENOUS | Status: DC

## 2016-02-17 MED ORDER — SODIUM CHLORIDE 0.9 % IV SOLN
INTRAVENOUS | Status: DC
  Administered 2016-02-17: 13:00:00 via INTRAVENOUS

## 2016-02-17 MED ORDER — IOPAMIDOL (ISOVUE-370) INJECTION 76%
INTRAVENOUS | Status: DC | PRN
  Administered 2016-02-17: 75 mL via INTRA_ARTERIAL

## 2016-02-17 MED ORDER — HEPARIN SODIUM (PORCINE) 1000 UNIT/ML IJ SOLN
INTRAMUSCULAR | Status: DC | PRN
  Administered 2016-02-17: 5500 [IU] via INTRAVENOUS

## 2016-02-17 MED ORDER — HEPARIN (PORCINE) IN NACL 2-0.9 UNIT/ML-% IJ SOLN
INTRAMUSCULAR | Status: DC | PRN
  Administered 2016-02-17: 1000 mL

## 2016-02-17 MED ORDER — ASPIRIN 81 MG PO CHEW: 81.0000 mg | CHEWABLE_TABLET | ORAL | Status: DC

## 2016-02-17 MED ORDER — FENTANYL CITRATE (PF) 100 MCG/2ML IJ SOLN
INTRAMUSCULAR | Status: DC | PRN
  Administered 2016-02-17: 25 ug via INTRAVENOUS

## 2016-02-17 SURGICAL SUPPLY — 8 items
CATH IMPULSE 5F ANG/FL3.5 (CATHETERS) ×2 IMPLANT
CATH VISTA GUIDE 6FR JL3 (CATHETERS) ×2 IMPLANT
GLIDESHEATH SLEND SS 6F .021 (SHEATH) ×2 IMPLANT
KIT HEART LEFT (KITS) ×2 IMPLANT
PACK CARDIAC CATHETERIZATION (CUSTOM PROCEDURE TRAY) ×2 IMPLANT
TRANSDUCER W/STOPCOCK (MISCELLANEOUS) ×2 IMPLANT
TUBING CIL FLEX 10 FLL-RA (TUBING) ×2 IMPLANT
WIRE SAFE-T 1.5MM-J .035X260CM (WIRE) ×2 IMPLANT

## 2016-02-17 NOTE — Interval H&P Note (Signed)
Cath Lab Visit (complete for each Cath Lab visit)  Clinical Evaluation Leading to the Procedure:   ACS: No.  Non-ACS:    Anginal Classification: CCS III  Anti-ischemic medical therapy: Minimal Therapy (1 class of medications)  Non-Invasive Test Results: No non-invasive testing performed  Prior CABG: No previous CABG      History and Physical Interval Note:  02/17/2016 2:50 PM  Hayden Rivera  has presented today for surgery, with the diagnosis of chest pain  The various methods of treatment have been discussed with the patient and family. After consideration of risks, benefits and other options for treatment, the patient has consented to  Procedure(s): Left Heart Cath and Coronary Angiography (N/A) as a surgical intervention .  The patient's history has been reviewed, patient examined, no change in status, stable for surgery.  I have reviewed the patient's chart and labs.  Questions were answered to the patient's satisfaction.     Maurie Musco [employer]

## 2016-02-17 NOTE — H&P (View-Only) (Signed)
Cardiology Office Note:    Date:  02/11/2016   ID:  Hayden Rivera, DOB 07/04/1950, MRN [id number]  PCP:  Hayden File, MD  Cardiologist:  Dr. Loralie Champagne   Electrophysiologist:  n/a  Referring MD: Hayden Euler, MD   No chief complaint on Rivera.   History of Present Illness:    Hayden Rivera is a 65 y.o. male with a hx of CAD status post prior stenting to the LAD, angioplasty to the LAD in 5/14 secondary to ISR and DES to the LCx (c/b peri-procedure infarct 2/2 distal embolization).  He is not on beta-blocker 2/2 bradycardia.  He was intolerant of Brilinta 2/2 to dyspnea.  Last cardiac catheterization in 5/16 demonstrated patent stents in LAD and LCx. He had moderate nonobstructive disease elsewhere. Medical therapy was recommended. Last see by Dr. Loralie Champagne in 2/17.   He presents today with his wife for evaluation of chest discomfort. Over the past 2-3 months, he has noted exertional chest burning. This typically occurs with walking his dog. It will start after about 100 yards. Symptoms resolve with rest. He's also noted some vague jaw discomfort. The jaw discomfort reminds him of his previous angina. He has not taken nitroglycerin. He denies associated nausea, diaphoresis or dyspnea. He has also noted recent indigestion with meals. This is fairly frequent. He denies orthopnea, PND or edema. Denies syncope.  Prior CV studies that were reviewed today include:    LHC 10/02/14 Left Main  35%   distal   Left Anterior Descending Prox 40% prior to stent, stent patent with 30% ISR Left Circumflex Mid 20%, stent is patent.  Ostial OM1 50% Right Coronary Artery Prox 30% and 30%  Stents patent.  The most significant lesion present was a 50% ostial stenosis in a high OM1.  I think that the Cardiolite defect was likely reflective of a prior MI.  He should be stable for knee surgery.   Myoview 09/17/14 Overall Impression:  High risk stress nuclear study with two defects: 1. There is  a small scar in the distal anterior wall and in the apex. 2. There is a large scar in the basal and mid inferoseptal, inferior and inferolateral walls with medium size, moderate severity  peri-infarct ischemia in the basal and mid inferolateral and basal anterolateral walls.  LV Ejection Fraction: 49%.  LV Wall Motion:  Paradoxical septal motion, akinesis in the basal inferior and inferoseptal walls, apical anterior walls.   Echo 09/16/14 EF 50-55%, anteroseptal AK, inferior AK, grade 2 diastolic dysfunction, mild aortic stenosis mean gradient 10 mmHg   Past Medical History:  Diagnosis Date  . Arthritis   . CAD (coronary artery disease)    a. Anterior STEMI 2005 c/b vfib arrest, PCI to LAD at [facility]. b. Stent thrombosis 2006 with DES within prior LAD stent at [facility]. C. 09/2012: s/p balloon angioplasty to mLAD for severe stenosis in previously stented segment & DES to LCx; initial enz neg but ruled in for NSTEMI after post-cath vagal sx, felt d/t distal emboliz of thrombus during case.  . Elevated hemidiaphragm    a. Noted 09/2012 - instructed to f/u PCP.  . Ischemic cardiomyopathy    a. Unclear prior EF but pt was told heart was weakened in past. b. EF normal 09/2012.  Marland Kitchen Kidney stones   . Myocardial infarction ([place])   . Shortness of breath dyspnea   . Ventricular fibrillation ([place])    a. VF arrest 2005 in setting of STEMI.  Marland Kitchen  Wears glasses   1. CAD: Anterior STEMI in [id number] complicated by ventricular fibrillation arrest.  PCI [facility] LAD at [facility].  Stent thrombosis in 2006, had DES to LAD in [place].  5/14 had balloon angioplasty to mid LAD (in-stent restenosis) and DES to LCx for unstable angina.  Echo (5/14) with EF 60%, no regional wall motion abnormalities, aortic sclerosis without stenosis. Lexiscan Cardiolite (4/16) with EF 49%, small apical anterior/apex scar, large basal-mid inferoseptal/inferior/inferolateral scar + prominent peri-infarct ischemia (read as high risk).  Echo (4/16) with EF  50%, mid to apical anteroseptal akinesis, mid to apical inferior akinesis, very mild aortic stenosis.  LHC (5/16) with 35% LM stenosis, serial 30% RCA stenoses, serial 30% and 40% proximal LAD stenoses, 50% OM1 stenosis.  2. 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  3. Hyperlipidemia. 4. Dyspnea with Brilinta.  5. HTN 6. Palpitations: Holter (7/14) occasional PVCs and PACs.    7. Aortic stenosis: Very mild on 4/16 echo.  8. OA knee  Past Surgical History:  Procedure Laterality Date  . BACK SURGERY    . CARDIAC CATHETERIZATION     stents x2  . CARDIAC CATHETERIZATION N/A 10/02/2014   Procedure: Left Heart Cath And Coronary Angiography;  Surgeon: Larey Dresser, MD;  Location: [facility] INVASIVE CV LAB CUPID;  Service: Cardiovascular;  Laterality: N/A;  . CORONARY STENT PLACEMENT    . HAND SURGERY    . KNEE ARTHROSCOPY Right 10/24/2014   Procedure: ARTHROSCOPY RIGHT KNEE, Partial medial menisectomy and chondroplasty;  Surgeon: Melrose Nakayama, MD;  Location: [place];  Service: Orthopedics;  Laterality: Right;  . LEFT HEART CATH N/A 10/11/2012   Procedure: LEFT HEART CATH;  Surgeon: Sherren Mocha, MD;  Location: [facility] CATH LAB;  Service: Cardiovascular;  Laterality: N/A;  . LITHOTRIPSY    . SPLIT NIGHT STUDY  09/21/2015  . TONSILLECTOMY      Current Medications: Outpatient Medications Prior to Visit  Medication Sig Dispense Refill  . acetaminophen (TYLENOL) 500 MG tablet Take 500 mg by mouth every 6 (six) hours as needed (PAIN).     Marland Kitchen aspirin 81 MG tablet Take 1 tablet (81 mg total) by mouth daily.    . cholecalciferol (VITAMIN D) 1000 UNITS tablet Take 2,000 Units by mouth daily.     . clopidogrel (PLAVIX) 75 MG tablet TAKE 1 TABLET EVERY DAY 90 tablet 2  . isosorbide mononitrate (IMDUR) 30 MG 24 hr tablet Take 1 tablet (30 mg total) by mouth daily. 90 tablet 2  . losartan (COZAAR) 100 MG tablet TAKE 1 TABLET EVERY DAY 90 tablet 3  . Misc Natural Products (OSTEO BI-FLEX  TRIPLE STRENGTH PO) Take 1 tablet by mouth 2 (two) times daily.    . nitroGLYCERIN (NITROSTAT) 0.4 MG SL tablet PLACE 1 TABLET UNDER THE TONGUE EVERY 5 MINUTES UP TO 3 DOSES AS NEEDED FOR CHEST PAIN 100 tablet 3  . Omega-3 Fatty Acids (FISH OIL) 1200 MG CAPS Take 1 capsule by mouth 2 (two) times daily.    . rosuvastatin (CRESTOR) 10 MG tablet Take 1 tablet (10 mg total) by mouth daily. 90 tablet 0   No facility-administered medications prior to visit.       Allergies:   Altace [ramipril] and Penicillins   Social History   Social History  . Marital status: Married    Spouse name: N/A  . Number of children: N/A  . Years of education: N/A   Occupational History  . retired    Science writer  History Main Topics  . Smoking status: Former Smoker    Packs/day: 0.50    Years: 10.00    Types: Cigars, Cigarettes    Quit date: 05/30/2002  . Smokeless tobacco: Never Used  . Alcohol use No  . Drug use: No  . Sexual activity: Not Currently   Other Topics Concern  . Not on Rivera   Social History Narrative  . No narrative on Rivera     Family History:  The patient's family history includes Healthy in his brother; Heart disease in his mother; Stroke in his father.   ROS:   Please see the history of present illness.    Review of Systems  Cardiovascular: Positive for chest pain, dyspnea on exertion and irregular heartbeat.   All other systems reviewed and are negative.   EKGs/Labs/Other Test Reviewed:    EKG:  EKG is   ordered today.  The ekg ordered today demonstrates Sinus bradycardia, HR 53, LAD, low voltage, QTc 412 ms, no significant ST changes, similar to prior tracing dated 07/02/15  Recent Labs: 07/16/2015: BUN 16; Creat 1.05; Potassium 3.9; Sodium 139   Recent Lipid Panel    Component Value Date/Time   CHOL 136 07/16/2015 0843   TRIG 118 07/16/2015 0843   HDL 40 07/16/2015 0843   CHOLHDL 3.4 07/16/2015 0843   VLDL 24 07/16/2015 0843   LDLCALC 72 07/16/2015 0843     Physical  Exam:    VS:  There were no vitals taken for this visit.    Wt Readings from Last 3 Encounters:  09/21/15 250 lb (113.4 kg)  07/02/15 256 lb (116.1 kg)  10/24/14 259 lb (117.5 kg)     Physical Exam  Constitutional: He is oriented to person, place, and time. He appears well-developed and well-nourished. No distress.  HENT:  Head: Normocephalic and atraumatic.  Eyes: No scleral icterus.  Neck: Normal range of motion. No JVD present.  Cardiovascular: Normal rate, regular rhythm, S1 normal and S2 normal.  Exam reveals no gallop and no friction rub.   Murmur heard.  Harsh systolic murmur is present with a grade of 2/6  at the upper right sternal border Pulmonary/Chest: Effort normal and breath sounds normal. He has no wheezes. He has no rhonchi. He has no rales.  Abdominal: Soft. There is no tenderness.  Musculoskeletal: He exhibits no edema.  Neurological: He is alert and oriented to person, place, and time.  Skin: Skin is warm and dry.  Psychiatric: He has a normal mood and affect.    ASSESSMENT:    1. Other chest pain   2. CAD S/P percutaneous coronary angioplasty/DES   3. Essential hypertension   4. Hyperlipidemia    PLAN:    In order of problems listed above:  1. CAD - Hx of Anterior STEMI c/b VF arrest in 2005 with PCI to LAD, DES to LAD in 2006 2/2 stent thrombosis [facility]), angioplasty to LAD in 2014 2/2/ ISR and DES to LCx in 2014 for [place].  Last LHC in 5/16 demonstrated patent stents and mild to mod non-obstructive CAD elsewhere.  He now presents with complaints of chest discomfort that are consistent with CCS class III angina. Stress testing would likely be abnormal given his previous nuclear study in 2016. I reviewed his case with Dr. Aundra Dubin by phone. He recommended proceeding with cardiac catheterization.  Risks and benefits of cardiac catheterization have been discussed with the patient.  These include bleeding, infection, kidney damage, stroke, heart attack, death.  The patient understands these risks and is willing to proceed.   -  LHC with Dr. Loralie Champagne next week  -  Increase Isosorbide to 60 mg QD  2. HTN - Controlled.  3. HL - Continue statin.  4. GERD - He also has symptoms of indigestion. Add Protonix 40 mg QD.    Medication Adjustments/Labs and Tests Ordered: Current medicines are reviewed at length with the patient today.  Concerns regarding medicines are outlined above.  Medication changes, Labs and Tests ordered today are outlined in the Patient Instructions noted below. There are no Patient Instructions on Rivera for this visit. Signed, Richardson Dopp, PA-C  02/11/2016 4:47 PM    [facility] [place], [place], [place]  [address] Phone: [phone]; Fax: [phone]

## 2016-02-17 NOTE — Discharge Instructions (Signed)
Radial Site Care °Refer to this sheet in the next few weeks. These instructions provide you with information about caring for yourself after your procedure. Your health care provider may also give you more specific instructions. Your treatment has been planned according to current medical practices, but problems sometimes occur. Call your health care provider if you have any problems or questions after your procedure. °WHAT TO EXPECT AFTER THE PROCEDURE °After your procedure, it is typical to have the following: °· Bruising at the radial site that usually fades within 1-2 weeks. °· Blood collecting in the tissue (hematoma) that may be painful to the touch. It should usually decrease in size and tenderness within 1-2 weeks. °HOME CARE INSTRUCTIONS °· Take medicines only as directed by your health care provider. °· You may shower 24-48 hours after the procedure or as directed by your health care provider. Remove the bandage (dressing) and gently wash the site with plain soap and water. Pat the area dry with a clean towel. Do not rub the site, because this may cause bleeding. °· Do not take baths, swim, or use a hot tub until your health care provider approves. °· Check your insertion site every day for redness, swelling, or drainage. °· Do not apply powder or lotion to the site. °· Do not flex or bend the affected arm for 24 hours or as directed by your health care provider. °· Do not push or pull heavy objects with the affected arm for 24 hours or as directed by your health care provider. °· Do not lift over 10 lb (4.5 kg) for 5 days after your procedure or as directed by your health care provider. °· Ask your health care provider when it is okay to: °¨ Return to work or school. °¨ Resume usual physical activities or sports. °¨ Resume sexual activity. °· Do not drive home if you are discharged the same day as the procedure. Have someone else drive you. °· You may drive 24 hours after the procedure unless otherwise  instructed by your health care provider. °· Do not operate machinery or power tools for 24 hours after the procedure. °· If your procedure was done as an outpatient procedure, which means that you went home the same day as your procedure, a responsible adult should be with you for the first 24 hours after you arrive home. °· Keep all follow-up visits as directed by your health care provider. This is important. °SEEK MEDICAL CARE IF: °· You have a fever. °· You have chills. °· You have increased bleeding from the radial site. Hold pressure on the site. Call 911 °SEEK IMMEDIATE MEDICAL CARE IF: °· You have unusual pain at the radial site. °· You have redness, warmth, or swelling at the radial site. °· You have drainage (other than a small amount of blood on the dressing) from the radial site. °· The radial site is bleeding, and the bleeding does not stop after 30 minutes of holding steady pressure on the site. °· Your arm or hand becomes pale, cool, tingly, or numb. °  °This information is not intended to replace advice given to you by your health care provider. Make sure you discuss any questions you have with your health care provider. °  °Document Released: 06/18/2010 Document Revised: 06/06/2014 Document Reviewed: 12/02/2013 °Elsevier Interactive Patient Education ©2016 [employer] ° °

## 2016-02-17 NOTE — Progress Notes (Signed)
Indication: 1. Suspected CAD  2. No Prior Noninvasive Testing 3. Symptomatic 4. High Pretest Probability A (7) Indication: 10;  Hayden Rivera 02/17/2016 2:53 PM

## 2016-02-18 ENCOUNTER — Encounter (HOSPITAL_COMMUNITY): Payer: Self-pay | Admitting: Cardiology

## 2016-02-24 ENCOUNTER — Encounter: Payer: Commercial Managed Care - HMO | Admitting: Cardiothoracic Surgery

## 2016-02-25 ENCOUNTER — Other Ambulatory Visit: Payer: Self-pay | Admitting: *Deleted

## 2016-02-25 DIAGNOSIS — I2510 Atherosclerotic heart disease of native coronary artery without angina pectoris: Secondary | ICD-10-CM

## 2016-02-26 ENCOUNTER — Institutional Professional Consult (permissible substitution) (INDEPENDENT_AMBULATORY_CARE_PROVIDER_SITE_OTHER): Payer: Commercial Managed Care - HMO | Admitting: Cardiothoracic Surgery

## 2016-02-26 ENCOUNTER — Encounter: Payer: Self-pay | Admitting: Cardiothoracic Surgery

## 2016-02-26 VITALS — BP 128/79 | HR 50 | Resp 16 | Ht 71.0 in | Wt 250.0 lb

## 2016-02-26 DIAGNOSIS — I208 Other forms of angina pectoris: Secondary | ICD-10-CM | POA: Diagnosis not present

## 2016-02-26 DIAGNOSIS — I2510 Atherosclerotic heart disease of native coronary artery without angina pectoris: Secondary | ICD-10-CM | POA: Diagnosis not present

## 2016-02-26 NOTE — Progress Notes (Signed)
[address]       [place] [address]             [phone]                    Hayden Rivera Medical Record [id number] Date of Birth: July 18, 1950  Referring: Larey Dresser, MD Primary Care: Kenn File, MD  Chief Complaint:    Chief Complaint  Patient presents with  . Coronary Artery Disease    CATH 02/17/16...will need ECHO per Dr. Aundra Dubin  . Chest Pain    with exertion    History of Present Illness:    Hayden Rivera 65 y.o. male is seen in the office  Referred cardiology for coronary artery bypass grafting consideration. Patient has a long history of known coronary artery disease. He presented in 2005 after cardiac arrest in [place] within anterior infarct. A stent was placed in the LAD by Dr. Gwenlyn Found. Following year after his Plavix was stopped he had a second stent placed within the first and the LAD while in [place]. In 2014 read the cardiac catheterization with stenting of the circumflex was performed at [facility]. Cardiac catheterization in 2016 showed stable disease.   Over the past several weeks the patient noted increasing discomfort in his chest especially when playing golf and walking uphill. He also noted episodes of substernal burning all walking his dogs associated with jaw pain. He denies any rest angina denies shortness of breath or diaphoresis.   Patient has no history of diabetes he is a previous smoker but quit more than 15 years ago.   Current Activity/ Functional Status:  Patient is independent with mobility/ambulation, transfers, ADL's, IADL's.   Zubrod Score: At the time of surgery this patient's most appropriate activity status/level should be described as: []     0    Normal activity, no symptoms [x]     1    Restricted in physical strenuous activity but ambulatory, able to do out light work []     2    Ambulatory and capable of self care, unable to do work activities, up and about               >50 % of  waking hours                              []     3    Only limited self care, in bed greater than 50% of waking hours []     4    Completely disabled, no self care, confined to bed or chair []     5    Moribund   Past Medical History:  Diagnosis Date  . Arthritis   . CAD (coronary artery disease)    a. Anterior STEMI 2005 c/b vfib arrest, PCI to LAD at [facility]. b. Stent thrombosis 2006 with DES within prior LAD stent at [facility]. C. 09/2012: s/p balloon angioplasty to mLAD for severe stenosis in previously stented segment & DES to LCx; initial enz neg but ruled in for NSTEMI after post-cath vagal sx, felt d/t distal emboliz of thrombus during case.  . Elevated hemidiaphragm    a. Noted 09/2012 - instructed to f/u PCP.  . Ischemic cardiomyopathy    a. Unclear prior EF but pt was told heart was weakened in past. b. EF normal 09/2012.  Marland Kitchen Kidney stones   . Myocardial infarction ([place])   .  Shortness of breath dyspnea   . 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 VF arrest 2005 in setting of STEMI.  . Wears glasses     Past Surgical History:  Procedure Laterality Date  . BACK SURGERY    . CARDIAC CATHETERIZATION     stents x2  . CARDIAC CATHETERIZATION N/A 10/02/2014   Procedure: Left Heart Cath And Coronary Angiography;  Surgeon: Larey Dresser, MD;  Location: [facility] INVASIVE CV LAB CUPID;  Service: Cardiovascular;  Laterality: N/A;  . CARDIAC CATHETERIZATION N/A 02/17/2016   Procedure: Left Heart Cath and Coronary Angiography;  Surgeon: Larey Dresser, MD;  Location: [facility] CV LAB;  Service: Cardiovascular;  Laterality: N/A;  . CORONARY STENT PLACEMENT    . HAND SURGERY    . KNEE ARTHROSCOPY Right 10/24/2014   Procedure: ARTHROSCOPY RIGHT KNEE, Partial medial menisectomy and chondroplasty;  Surgeon: Melrose Nakayama, MD;  Location: [facility];  Service: Orthopedics;  Laterality: Right;  . LEFT HEART CATH N/A 10/11/2012   Procedure: LEFT HEART CATH;  Surgeon: Sherren Mocha, MD;  Location: [facility] CATH LAB;   Service: Cardiovascular;  Laterality: N/A;  . LITHOTRIPSY    . SPLIT NIGHT STUDY  09/21/2015  . TONSILLECTOMY      Family History  Problem Relation Age of Onset  . Heart disease Mother   . Stroke Father   . Healthy Brother     Social History   Social History  . Marital status: Married    Spouse name: N/A  . Number of children: N/A  . Years of education: N/A   Occupational History  . retired    Social History Main Topics  . Smoking status: Former Smoker    Packs/day: 0.50    Years: 10.00    Types: Cigars, Cigarettes    Quit date: 05/30/2002  . Smokeless tobacco: Never Used  . Alcohol use No  . Drug use: No  . Sexual activity: Not Currently   Other Topics Concern  . Not on file   Social History Narrative  . No narrative on file    History  Smoking Status  . Former Smoker  . Packs/day: 0.50  . Years: 10.00  . Types: Cigars, Cigarettes  . Quit date: 05/30/2002  Smokeless Tobacco  . Never Used    History  Alcohol Use No     Allergies  Allergen Reactions  . Altace [Ramipril] Shortness Of Breath and Other (See Comments)    cough  . Penicillins Swelling    Has patient had a PCN reaction causing immediate rash, facial/tongue/throat swelling, SOB or lightheadedness with hypotension:unsure Has patient had a PCN reaction causing severe rash involving mucus membranes or skin necrosis:unsure Has patient had a PCN reaction that required hospitalization:No Has patient had a PCN reaction occurring within the last 10 years:NO If all of the above answers are "NO", then may proceed with Cephalosporin use.  Childhood reaction      Current Outpatient Prescriptions  Medication Sig Dispense Refill  . acetaminophen (TYLENOL) 500 MG tablet Take 500 mg by mouth every 6 (six) hours as needed (PAIN).     Marland Kitchen aspirin 81 MG tablet Take 1 tablet (81 mg total) by mouth daily.    Marland Kitchen aspirin EC 81 MG tablet Take 81 mg by mouth daily.    . cholecalciferol (VITAMIN D) 1000 UNITS  tablet Take 2,000 Units by mouth daily.     . clopidogrel (PLAVIX) 75 MG tablet Take 75 mg by mouth at bedtime.     Marland Kitchen  Coenzyme Q10 (COQ10) 100 MG CAPS Take 100 mg by mouth daily.    . isosorbide mononitrate (IMDUR) 60 MG 24 hr tablet Take 1 tablet (60 mg total) by mouth daily. (Patient taking differently: Take 60 mg by mouth every evening. ) 90 tablet 3  . losartan (COZAAR) 100 MG tablet Take 100 mg by mouth daily.    . metoprolol tartrate (LOPRESSOR) 25 MG tablet Take 12.5 mg by mouth 2 (two) times daily.    . nitroGLYCERIN (NITROSTAT) 0.4 MG SL tablet PLACE 1 TABLET UNDER THE TONGUE EVERY 5 MINUTES UP TO 3 DOSES AS NEEDED FOR CHEST PAIN 100 tablet 3  . Omega-3 Fatty Acids (FISH OIL) 1200 MG CAPS Take 1 capsule by mouth 2 (two) times daily.    . pantoprazole (PROTONIX) 40 MG tablet Take 1 tablet (40 mg total) by mouth daily. 30 tablet 6  . rosuvastatin (CRESTOR) 10 MG tablet Take 1 tablet (10 mg total) by mouth daily. (Patient taking differently: Take 10 mg by mouth every evening. ) 90 tablet 3   No current facility-administered medications for this visit.       Review of Systems:     Cardiac Review of Systems: Y or N  Chest Pain [  y  ]  Resting SOB [ n  ] Exertional SOB  [ n ]  Orthopnea [ n ]   Pedal Edema [n   ]    Palpitations [n  ] Syncope  Florencio.Farrier  ]   Presyncope [  n ]  General Review of Systems: [Y] = yes [  ]=no Constitional: recent weight change [ n ];  Wt loss over the last 3 months [   ] anorexia [  ]; fatigue [  ]; nausea [  ]; night sweats [  ]; fever [  ]; or chills [  ];          Dental: poor dentition[ n ]; Last Dentist visit:   Eye : blurred vision [  ]; diplopia [   ]; vision changes [  ];  Amaurosis fugax[  ]; Resp: cough [  ];  wheezing[ n ];  hemoptysis[n  ]; shortness of breath[n  ]; paroxysmal nocturnal dyspnea[  ]; dyspnea on exertion[  ]; or orthopnea[  ];  GI:  gallstones[  ], vomiting[  ];  dysphagia[  ]; melena[  ];  hematochezia [  ]; heartburn[  ];   Hx of   Colonoscopy[  ]; GU: kidney stones [  ]; hematuria[  ];   dysuria [  ];  nocturia[  ];  history of     obstruction [  ]; urinary frequency [  ]             Skin: rash, swelling[  ];, hair loss[  ];  peripheral edema[  ];  or itching[  ]; Musculosketetal: myalgias[  ];  joint swelling[  ];  joint erythema[  ];  joint pain[  ];  back pain[  ];  Heme/Lymph: bruising[  ];  bleeding[  ];  anemia[  ];  Neuro: TIA[  ];  headaches[  ];  stroke[  ];  vertigo[  ];  seizures[  ];   paresthesias[  ];  difficulty walking[  n];  Psych:depression[  ]; anxiety[  ];  Endocrine: diabetes[  ];  thyroid dysfunction[  ];  Immunizations: Flu up to date [ n ]; Pneumococcal up to date Florencio.Farrier  ];  Other:  Physical Exam: BP 128/79  Pulse (!) 50   Resp 16   Ht 5\' 11"  (1.803 m)   Wt 250 lb (113.4 kg)   SpO2 97% Comment: ON RA  BMI 34.87 kg/m   PHYSICAL EXAMINATION: General appearance: alert, cooperative, appears stated age and no distress Head: Normocephalic, without obvious abnormality, atraumatic Neck: no adenopathy, no carotid bruit, no JVD, supple, symmetrical, trachea midline and thyroid not enlarged, symmetric, no tenderness/mass/nodules Lymph nodes: Cervical, supraclavicular, and axillary nodes normal. Resp: clear to auscultation bilaterally Back: symmetric, no curvature. ROM normal. No CVA tenderness. Cardio: regular rate and rhythm, S1, S2 normal, no murmur, click, rub or gallop GI: soft, non-tender; bowel sounds normal; no masses,  no organomegaly Extremities: extremities normal, atraumatic, no cyanosis or edema and varicose veins noted Neurologic: Grossly normal Patient has no carotid bruits, full DP and PT pulses bilaterally, varicose veins are noted in the left leg Patient is right-handed, was cath in the right radial artery, by Allen's test right ulnar artery is sole provider of the right hand, digit blood flow is maintained in the left hand with either radial or ulnar compression.      Diagnostic Studies & Laboratory data:     Recent Radiology Findings:  No results found.    Recent Lab Findings: Lab Results  Component Value Date   WBC 9.6 02/12/2016   HGB 14.7 02/12/2016   HCT 43.3 02/12/2016   PLT 176 02/12/2016   GLUCOSE 106 (H) 02/12/2016   CHOL 136 07/16/2015   TRIG 118 07/16/2015   HDL 40 07/16/2015   LDLCALC 72 07/16/2015   ALT 47 (H) 10/23/2013   AST 47 (H) 10/23/2013   NA 140 02/12/2016   K 4.1 02/12/2016   CL 106 02/12/2016   CREATININE 1.00 02/12/2016   BUN 16 02/12/2016   CO2 22 02/12/2016   TSH 2.083 10/10/2012   INR 1.2 (H) 02/12/2016   CATH: Larey Dresser, MD (Primary)    Procedures   Left Heart Cath and Coronary Angiography  Conclusion   Complex lesion involving the ostial circumflex, ostial ramus, and distal left main.  50% distal left main stenosis, 90-95% ostial LCx and ostial ramus stenosis.    I reviewed the films with Dr Martinique.  Not ideal anatomy for intervention.  I think that he would be best served by CABG.  I am going to refer him to cardiac surgery, 1st available appt.  As angina has been only with exertion, I am going to let him go home. He will be given a prescription for metoprolol 12.5 mg bid.  He will need an echo.  He also will need to stop Plavix once CABG is scheduled.   Procedural Details/Technique   Technical Details Procedure: Selective Coronary Angiography  Indication: Exertional angina, progressive over the last 2-3 months.   Procedural Details: The right wrist was prepped, draped, and anesthetized with 1% lidocaine. Using the modified Seldinger technique, a 6 French Slender sheath was introduced into the right radial artery. 3 mg of verapamil was administered through the sheath, weight-based unfractionated heparin was administered intravenously. Standard Judkins catheters were used for selective coronary angiography. Catheter exchanges were performed over an exchange length guidewire. There were no immediate  procedural complications. A TR band was used for radial hemostasis at the completion of the procedure. The patient was transferred to the post catheterization recovery area for further monitoring.   Loralie Champagne MD, [facility] 02/17/2016, 3:48 PM   Estimated blood loss <50 mL. . During this procedure the patient was  administered the following to achieve and maintain moderate conscious sedation: Versed 1 mg, Fentanyl 25 mcg, while the patient's heart rate, blood pressure, and oxygen saturation were continuously monitored. The period of conscious sedation was 39 minutes, of which I was present face-to-face 100% of this time.    Coronary Findings   Dominance: Left  Left Main  50% distal left main stenosis seen best in RAO cranial view.  Left Anterior Descending  Heavily calcified proximal LAD. 40% proximal LAD stenosis proximal to the previously-placed LAD stent. There is about 30% in-stent restenosis in the LAD stent.  Ramus Intermedius  Moderate vessel, 90% ostial stenosis.  Left Circumflex  95% ostial LCx stenosis. This lesion also involves the ostial ramus. Patent mid-LCx stenosis.  Right Coronary Artery  Luminal irregularities.  Wall Motion   Not done. Will get echo.         I have independently reviewed the above  cath films and reviewed the findings with the  patient .  Echo: not done    Assessment / Plan:   Known long history of coronary artery disease now with progressive disease in the proximal circumflex and left main artery into some degree in the proximal LAD. With the patient's new onset of symptoms and current anatomy I agree with the recommendation to proceed with coronary artery bypass grafting. We've discussed the case with Dr. Aundra Dubin. Patient will hold his Plavix starting today and we will proceed with planned coronary artery bypass grafting on Tuesday, October 3. Risks and options of surgery have been discussed with the patient and his wife in detail. We also specifically  discussed possible use of the left radial artery.    Patient's been cautioned about doing any type of physical exertion until surgery, and is aware if he has recurrent symptoms to call 911 and return.  Echocardiogram to evaluate his LV function is pending and is planned for Monday, October 2 along with preoperative vascular studies.  I  spent 40 minutes counseling the patient face to face and 50% or more the  time was spent in counseling and coordination of care. The total time spent in the appointment was 60 minutes.  Grace Isaac MD      [address] [place] [address] Office [phone]   Beeper [phone]  02/26/2016 11:56 AM

## 2016-02-26 NOTE — Patient Instructions (Signed)
Coronary Artery Bypass Grafting Coronary artery bypass grafting (CABG) is a procedure done to bypass or fix arteries of the heart (coronary arteries) that have become narrow or blocked. This narrowing is usually the result of plaque that has built up in the walls of the vessels. The coronary arteries supply the heart with the oxygen and nutrients it needs to pump blood to your body. In the CABG procedure, a section of blood vessel from another part of the body (usually the leg, arm, or chest wall) is removed and then inserted where it will allow blood to bypass the damaged part of the coronary artery.  LET [facility] CARE PROVIDER KNOW ABOUT:  Any allergies you have.   All medicines you are taking, including blood thinners, vitamins, herbs, eye drops, creams, and over-the-counter medicines.   Use of steroids (by mouth or creams).   Previous problems you or members of your family have had with the use of anesthetics.   Any blood disorders you have.   Previous surgeries you have had.   Medical conditions you have.  RISKS AND COMPLICATIONS Generally, this is a safe procedure. However, problems can occur and include:   Blood loss.   Stroke.   Infection.   Pain at the surgical site.   Heart attack during or after surgery.   Kidney failure.  BEFORE THE PROCEDURE  Take medicines only as directed by your health care provider. You may be asked to start new medicines and stop taking others. Do not stop medicines or adjust dosages on your own.  Do not eat or drink anything after midnight the night before the procedure or as directed by your health care provider. Ask your health care provider if it is okay to take a sip of water with any needed medicines. PROCEDURE The surgeon may use either an open technique or a minimally invasive technique for this surgery. Traditional open surgery  You will be given medicine to make you sleep through the procedure (general  anesthetic).  Once you are asleep, a cut (incision) will be made down the front of the chest through the breastbone (sternum). The sternum will be spread open so your heart can be seen.  You will then be placed on a heart-lung bypass machine. This machine will provide oxygen to your blood while the heart is undergoing surgery.  Your heart will then be temporarily stopped so that the surgeon can do the next steps.  A section of vein will likely be taken from your leg and used to bypass the blocked arteries of your heart. Sometimes parts of an artery from inside your chest wall or from your arm will be used, either alone or in combination with leg veins.  When the bypasses are done, you will be taken off the machine.  Your heart will be restarted and will take over again normally.  The sac around the heart will be closed.  Your chest will then be closed with stitches or staples.  Tubes will remain in your chest and will be connected to a suction device in order to help drain fluid and reinflate the lungs. Minimally invasive surgery This technique is done from an incision over your left chest area. If appropriate, your surgeon may not have to slow or stop your heart. If your condition allows for this procedure, there will often be less blood loss, less pain, a shorter hospital stay, and faster recovery compared to traditional open surgery. AFTER THE PROCEDURE  You will be taken to  a recovery area to be monitored.  You may wake up with a tube in your throat to help your breathing. You may be connected to a breathing machine. You will not be able to talk while the tube is in place. The tube will be taken out as soon as it is safe to do so.  You will be groggy and may have some pain. You will be given pain medicine to help control the pain.   This information is not intended to replace advice given to you by your health care provider. Make sure you discuss any questions you have with your  health care provider.   Document Released: 02/23/2005 Document Revised: 06/06/2014 Document Reviewed: 10/23/2012 Elsevier Interactive Patient Education 2016 Elsevier Inc.  Coronary Artery Bypass Grafting, Care After Refer to this sheet in the next few weeks. These instructions provide you with information on caring for yourself after your procedure. Your health care provider may also give you more specific instructions. Your treatment has been planned according to current medical practices, but problems sometimes occur. Call your health care provider if you have any problems or questions after your procedure. WHAT TO EXPECT AFTER THE PROCEDURE Recovery from surgery will be different for everyone. Some people feel well after 3 or 4 weeks, while for others it takes longer. After your procedure, it is typical to have the following:  Nausea and a lack of appetite.   Constipation.  Weakness and fatigue.   Depression or irritability.   Pain or discomfort at your incision site. HOME CARE INSTRUCTIONS  Take medicines only as directed by your health care provider. Do not stop taking medicines or start any new medicines without first checking with your health care provider.  Take your pulse as directed by your health care provider.  Perform deep breathing as directed by your health care provider. If you were given a device called an incentive spirometer, use it to practice deep breathing several times a day. Support your chest with a pillow or your arms when you take deep breaths or cough.  Keep incision areas clean, dry, and protected. Remove or change any bandages (dressings) only as directed by your health care provider. You may have skin adhesive strips over the incision areas. Do not take the strips off. They will fall off on their own.  Check incision areas daily for any swelling, redness, or drainage.  If incisions were made in your legs, do the following:  Avoid crossing your legs.    Avoid sitting for long periods of time. Change positions every 30 minutes.   Elevate your legs when you are sitting.  Wear compression stockings as directed by your health care provider. These stockings help keep blood clots from forming in your legs.  Take showers once your health care provider approves. Until then, only take sponge baths. Pat incisions dry. Do not rub incisions with a washcloth or towel. Do not take baths, swim, or use a hot tub until your health care provider approves.  Eat foods that are high in fiber, such as raw fruits and vegetables, whole grains, beans, and nuts. Meats should be lean cut. Avoid canned, processed, and fried foods.  Drink enough fluid to keep your urine clear or pale yellow.  Weigh yourself every day. This helps identify if you are retaining fluid that may make your heart and lungs work harder.  Rest and limit activity as directed by your health care provider. You may be instructed to:  Stop any activity  at once if you have chest pain, shortness of breath, irregular heartbeats, or dizziness. Get help right away if you have any of these symptoms.  Move around frequently for short periods or take short walks as directed by your health care provider. Increase your activities gradually. You may need physical therapy or cardiac rehabilitation to help strengthen your muscles and build your endurance.  Avoid lifting, pushing, or pulling anything heavier than 10 lb (4.5 kg) for at least 6 weeks after surgery.  Do not drive until your health care provider approves.  Ask your health care provider when you may return to work.  Ask your health care provider when you may resume sexual activity.  Keep all follow-up visits as directed by your health care provider. This is important. SEEK MEDICAL CARE IF:  You have swelling, redness, increasing pain, or drainage at the site of an incision.  You have a fever.  You have swelling in your ankles or  legs.  You have pain in your legs.   You gain 2 or more pounds (0.9 kg) a day.  You are nauseous or vomit.  You have diarrhea. SEEK IMMEDIATE MEDICAL CARE IF:  You have chest pain that goes to your jaw or arms.  You have shortness of breath.   You have a fast or irregular heartbeat.   You notice a "clicking" in your breastbone (sternum) when you move.   You have numbness or weakness in your arms or legs.  You feel dizzy or light-headed.  MAKE SURE YOU:  Understand these instructions.  Will watch your condition.  Will get help right away if you are not doing well or get worse.   This information is not intended to replace advice given to you by your health care provider. Make sure you discuss any questions you have with your health care provider.   Document Released: 12/03/2004 Document Revised: 06/06/2014 Document Reviewed: 10/23/2012 Elsevier Interactive Patient Education [employer].

## 2016-02-28 DIAGNOSIS — I4891 Unspecified atrial fibrillation: Secondary | ICD-10-CM

## 2016-02-28 HISTORY — DX: Unspecified atrial fibrillation: I48.91

## 2016-02-29 ENCOUNTER — Ambulatory Visit (HOSPITAL_BASED_OUTPATIENT_CLINIC_OR_DEPARTMENT_OTHER)
Admission: RE | Admit: 2016-02-29 | Discharge: 2016-02-29 | Disposition: A | Discharge status: Home / Self Care | Payer: Commercial Managed Care - HMO | Source: Ambulatory Visit | Attending: Physician Assistant | Admitting: Physician Assistant

## 2016-02-29 ENCOUNTER — Ambulatory Visit (HOSPITAL_BASED_OUTPATIENT_CLINIC_OR_DEPARTMENT_OTHER)
Admission: RE | Admit: 2016-02-29 | Discharge: 2016-02-29 | Disposition: A | Discharge status: Home / Self Care | Payer: Commercial Managed Care - HMO | Source: Ambulatory Visit | Attending: Cardiothoracic Surgery | Admitting: Cardiothoracic Surgery

## 2016-02-29 ENCOUNTER — Observation Stay (HOSPITAL_COMMUNITY)
Admission: RE | Admit: 2016-02-29 | Discharge: 2016-02-29 | Disposition: A | Discharge status: Home / Self Care | Payer: Commercial Managed Care - HMO | Source: Ambulatory Visit | Attending: Cardiothoracic Surgery | Admitting: Cardiothoracic Surgery

## 2016-02-29 ENCOUNTER — Encounter (HOSPITAL_COMMUNITY)
Admission: RE | Admit: 2016-02-29 | Discharge: 2016-02-29 | Disposition: A | Discharge status: Home / Self Care | Payer: Commercial Managed Care - HMO | Source: Ambulatory Visit | Attending: Cardiothoracic Surgery | Admitting: Cardiothoracic Surgery

## 2016-02-29 ENCOUNTER — Encounter (HOSPITAL_COMMUNITY): Payer: Self-pay

## 2016-02-29 ENCOUNTER — Ambulatory Visit (HOSPITAL_COMMUNITY)
Admission: RE | Admit: 2016-02-29 | Discharge: 2016-02-29 | Disposition: A | Discharge status: Home / Self Care | Payer: Commercial Managed Care - HMO | Source: Ambulatory Visit | Attending: Cardiothoracic Surgery | Admitting: Cardiothoracic Surgery

## 2016-02-29 DIAGNOSIS — I340 Nonrheumatic mitral (valve) insufficiency: Secondary | ICD-10-CM

## 2016-02-29 DIAGNOSIS — I6523 Occlusion and stenosis of bilateral carotid arteries: Secondary | ICD-10-CM

## 2016-02-29 DIAGNOSIS — Z87442 Personal history of urinary calculi: Secondary | ICD-10-CM | POA: Diagnosis not present

## 2016-02-29 DIAGNOSIS — Z01818 Encounter for other preprocedural examination: Secondary | ICD-10-CM

## 2016-02-29 DIAGNOSIS — R079 Chest pain, unspecified: Secondary | ICD-10-CM | POA: Diagnosis not present

## 2016-02-29 DIAGNOSIS — Z87891 Personal history of nicotine dependence: Secondary | ICD-10-CM | POA: Diagnosis not present

## 2016-02-29 DIAGNOSIS — D696 Thrombocytopenia, unspecified: Secondary | ICD-10-CM | POA: Diagnosis present

## 2016-02-29 DIAGNOSIS — I2510 Atherosclerotic heart disease of native coronary artery without angina pectoris: Secondary | ICD-10-CM | POA: Diagnosis not present

## 2016-02-29 DIAGNOSIS — R7303 Prediabetes: Secondary | ICD-10-CM | POA: Diagnosis present

## 2016-02-29 DIAGNOSIS — Z01812 Encounter for preprocedural laboratory examination: Secondary | ICD-10-CM

## 2016-02-29 DIAGNOSIS — Z0183 Encounter for blood typing: Secondary | ICD-10-CM | POA: Insufficient documentation

## 2016-02-29 DIAGNOSIS — I255 Ischemic cardiomyopathy: Secondary | ICD-10-CM

## 2016-02-29 DIAGNOSIS — J90 Pleural effusion, not elsewhere classified: Secondary | ICD-10-CM | POA: Diagnosis not present

## 2016-02-29 DIAGNOSIS — M1990 Unspecified osteoarthritis, unspecified site: Secondary | ICD-10-CM | POA: Diagnosis present

## 2016-02-29 DIAGNOSIS — I080 Rheumatic disorders of both mitral and aortic valves: Secondary | ICD-10-CM | POA: Diagnosis not present

## 2016-02-29 DIAGNOSIS — Z955 Presence of coronary angioplasty implant and graft: Secondary | ICD-10-CM | POA: Diagnosis not present

## 2016-02-29 DIAGNOSIS — I4891 Unspecified atrial fibrillation: Secondary | ICD-10-CM | POA: Diagnosis not present

## 2016-02-29 DIAGNOSIS — I252 Old myocardial infarction: Secondary | ICD-10-CM | POA: Diagnosis not present

## 2016-02-29 DIAGNOSIS — E8770 Fluid overload, unspecified: Secondary | ICD-10-CM | POA: Diagnosis not present

## 2016-02-29 DIAGNOSIS — Z23 Encounter for immunization: Secondary | ICD-10-CM | POA: Diagnosis not present

## 2016-02-29 DIAGNOSIS — Z950 Presence of cardiac pacemaker: Secondary | ICD-10-CM | POA: Diagnosis not present

## 2016-02-29 DIAGNOSIS — I25110 Atherosclerotic heart disease of native coronary artery with unstable angina pectoris: Secondary | ICD-10-CM | POA: Diagnosis present

## 2016-02-29 DIAGNOSIS — Z8249 Family history of ischemic heart disease and other diseases of the circulatory system: Secondary | ICD-10-CM | POA: Diagnosis not present

## 2016-02-29 DIAGNOSIS — Z7902 Long term (current) use of antithrombotics/antiplatelets: Secondary | ICD-10-CM | POA: Diagnosis not present

## 2016-02-29 DIAGNOSIS — D62 Acute posthemorrhagic anemia: Secondary | ICD-10-CM | POA: Diagnosis not present

## 2016-02-29 DIAGNOSIS — Z8674 Personal history of sudden cardiac arrest: Secondary | ICD-10-CM | POA: Diagnosis not present

## 2016-02-29 DIAGNOSIS — I119 Hypertensive heart disease without heart failure: Secondary | ICD-10-CM | POA: Diagnosis present

## 2016-02-29 DIAGNOSIS — Z4682 Encounter for fitting and adjustment of non-vascular catheter: Secondary | ICD-10-CM | POA: Diagnosis not present

## 2016-02-29 DIAGNOSIS — K219 Gastro-esophageal reflux disease without esophagitis: Secondary | ICD-10-CM | POA: Diagnosis present

## 2016-02-29 DIAGNOSIS — J9811 Atelectasis: Secondary | ICD-10-CM | POA: Diagnosis not present

## 2016-02-29 DIAGNOSIS — Z823 Family history of stroke: Secondary | ICD-10-CM | POA: Diagnosis not present

## 2016-02-29 HISTORY — DX: Cardiac murmur, unspecified: R01.1

## 2016-02-29 HISTORY — DX: Essential (primary) hypertension: I10

## 2016-02-29 HISTORY — DX: Gastro-esophageal reflux disease without esophagitis: K21.9

## 2016-02-29 HISTORY — DX: Personal history of urinary calculi: Z87.442

## 2016-02-29 LAB — PULMONARY FUNCTION TEST
DL/VA % pred: 99 %
DL/VA: 4.64 ml/min/mmHg/L
DLCO unc % pred: 88 %
DLCO unc: 29.8 ml/min/mmHg
FEF 25-75 Post: 3.33 L/sec
FEF 25-75 Pre: 3.23 L/sec
FEF2575-%Change-Post: 3 %
FEF2575-%Pred-Post: 120 %
FEF2575-%Pred-Pre: 116 %
FEV1-%Change-Post: 0 %
FEV1-%Pred-Post: 90 %
FEV1-%Pred-Pre: 91 %
FEV1-Post: 3.21 L
FEV1-Pre: 3.22 L
FEV1FVC-%Change-Post: -2 %
FEV1FVC-%Pred-Pre: 109 %
FEV6-%Change-Post: 2 %
FEV6-%Pred-Post: 89 %
FEV6-%Pred-Pre: 87 %
FEV6-Post: 4.01 L
FEV6-Pre: 3.92 L
FEV6FVC-%Change-Post: 0 %
FEV6FVC-%Pred-Post: 103 %
FEV6FVC-%Pred-Pre: 103 %
FVC-%Change-Post: 2 %
FVC-%Pred-Post: 86 %
FVC-%Pred-Pre: 83 %
FVC-Post: 4.08 L
FVC-Pre: 3.98 L
Post FEV1/FVC ratio: 79 %
Post FEV6/FVC ratio: 98 %
Pre FEV1/FVC ratio: 81 %
Pre FEV6/FVC Ratio: 99 %
RV % pred: 101 %
RV: 2.46 L
TLC % pred: 94 %
TLC: 6.78 L

## 2016-02-29 LAB — VAS US DOPPLER PRE CABG
LEFT ECA DIAS: -12 cm/s
LEFT VERTEBRAL DIAS: 12 cm/s
Left CCA dist dias: -12 cm/s
Left CCA dist sys: -62 cm/s
Left CCA prox dias: 15 cm/s
Left CCA prox sys: 98 cm/s
Left ICA dist dias: -26 cm/s
Left ICA dist sys: -81 cm/s
Left ICA prox dias: -19 cm/s
Left ICA prox sys: -57 cm/s
RIGHT ECA DIAS: -16 cm/s
RIGHT VERTEBRAL DIAS: 12 cm/s
Right CCA prox dias: 24 cm/s
Right CCA prox sys: 86 cm/s
Right cca dist sys: -81 cm/s

## 2016-02-29 LAB — URINALYSIS, ROUTINE W REFLEX MICROSCOPIC
Bilirubin Urine: NEGATIVE
Glucose, UA: NEGATIVE mg/dL
Ketones, ur: NEGATIVE mg/dL
Nitrite: NEGATIVE
Protein, ur: 30 mg/dL — AB
Specific Gravity, Urine: >1.03 — ABNORMAL HIGH (ref 1.005–1.030)
pH: 6 (ref 5.0–8.0)

## 2016-02-29 LAB — COMPREHENSIVE METABOLIC PANEL
ALT: 57 U/L (ref 17–63)
AST: 53 U/L — ABNORMAL HIGH (ref 15–41)
Albumin: 4.5 g/dL (ref 3.5–5.0)
Alkaline Phosphatase: 74 U/L (ref 38–126)
Anion gap: 9 (ref 5–15)
BUN: 14 mg/dL (ref 6–20)
CO2: 22 mmol/L (ref 22–32)
Calcium: 9.6 mg/dL (ref 8.9–10.3)
Chloride: 109 mmol/L (ref 101–111)
Creatinine, Ser: 1.03 mg/dL (ref 0.61–1.24)
GFR calc Af Amer: >60 mL/min (ref >60)
GFR calc non Af Amer: >60 mL/min (ref >60)
Glucose, Bld: 119 mg/dL — ABNORMAL HIGH (ref 65–99)
Potassium: 3.8 mmol/L (ref 3.5–5.1)
Sodium: 140 mmol/L (ref 135–145)
Total Bilirubin: 0.9 mg/dL (ref 0.3–1.2)
Total Protein: 7.7 g/dL (ref 6.5–8.1)

## 2016-02-29 LAB — CBC
HCT: 43.6 % (ref 39.0–52.0)
Hemoglobin: 14.8 g/dL (ref 13.0–17.0)
MCH: 33.3 pg (ref 26.0–34.0)
MCHC: 33.9 g/dL (ref 30.0–36.0)
MCV: 98 fL (ref 78.0–100.0)
Platelets: 142 10*3/uL — ABNORMAL LOW (ref 150–400)
RBC: 4.45 MIL/uL (ref 4.22–5.81)
RDW: 13 % (ref 11.5–15.5)
WBC: 9.9 10*3/uL (ref 4.0–10.5)

## 2016-02-29 LAB — BLOOD GAS, ARTERIAL
Acid-base deficit: 0.7 mmol/L (ref 0.0–2.0)
Bicarbonate: 23.2 mmol/L (ref 20.0–28.0)
Drawn by: 206361
O2 Saturation: 95.5 %
Patient temperature: 98.6
pCO2 arterial: 37.1 mmHg (ref 32.0–48.0)
pH, Arterial: 7.413 (ref 7.350–7.450)
pO2, Arterial: 78.8 mmHg — ABNORMAL LOW (ref 83.0–108.0)

## 2016-02-29 LAB — URINE MICROSCOPIC-ADD ON

## 2016-02-29 LAB — PLATELET INHIBITION P2Y12: Platelet Function  P2Y12: 222 [PRU] (ref 194–418)

## 2016-02-29 LAB — TYPE AND SCREEN
ABO/RH(D): AB POS
Antibody Screen: NEGATIVE

## 2016-02-29 LAB — PROTIME-INR
INR: 1.18
Prothrombin Time: 15.1 seconds (ref 11.4–15.2)

## 2016-02-29 LAB — SURGICAL PCR SCREEN
MRSA, PCR: NEGATIVE
STAPHYLOCOCCUS AUREUS: NEGATIVE

## 2016-02-29 LAB — APTT: aPTT: 30 seconds (ref 24–36)

## 2016-02-29 MED ORDER — TRANEXAMIC ACID (OHS) PUMP PRIME SOLUTION
2.0000 mg/kg | INTRAVENOUS | Status: DC
  Filled 2016-02-29: qty 2.27

## 2016-02-29 MED ORDER — LEVOFLOXACIN IN D5W 500 MG/100ML IV SOLN
500.0000 mg | INTRAVENOUS | Status: AC
  Administered 2016-03-01: 500 mg via INTRAVENOUS
  Filled 2016-02-29 (×2): qty 100

## 2016-02-29 MED ORDER — DOPAMINE-DEXTROSE 3.2-5 MG/ML-% IV SOLN
0.0000 ug/kg/min | INTRAVENOUS | Status: DC
  Filled 2016-02-29: qty 250

## 2016-02-29 MED ORDER — DEXMEDETOMIDINE HCL IN NACL 400 MCG/100ML IV SOLN
0.1000 ug/kg/h | INTRAVENOUS | Status: AC
  Administered 2016-03-01: 0.7 ug/kg/h via INTRAVENOUS
  Filled 2016-02-29: qty 100

## 2016-02-29 MED ORDER — SODIUM CHLORIDE 0.9 % IV SOLN
INTRAVENOUS | Status: AC
  Administered 2016-03-01: 1.9 [IU]/h via INTRAVENOUS
  Filled 2016-02-29: qty 2.5

## 2016-02-29 MED ORDER — PHENYLEPHRINE HCL 10 MG/ML IJ SOLN
30.0000 ug/min | INTRAVENOUS | Status: AC
  Administered 2016-03-01: 40 ug/min via INTRAVENOUS
  Filled 2016-02-29: qty 2

## 2016-02-29 MED ORDER — VANCOMYCIN HCL 10 G IV SOLR
1250.0000 mg | INTRAVENOUS | Status: AC
  Administered 2016-03-01: 1250 mg via INTRAVENOUS
  Filled 2016-02-29 (×2): qty 1250

## 2016-02-29 MED ORDER — PLASMA-LYTE 148 IV SOLN
INTRAVENOUS | Status: AC
  Administered 2016-03-01: 500 mL
  Filled 2016-02-29: qty 2.5

## 2016-02-29 MED ORDER — NITROGLYCERIN IN D5W 200-5 MCG/ML-% IV SOLN
2.0000 ug/min | INTRAVENOUS | Status: DC
  Filled 2016-02-29: qty 250

## 2016-02-29 MED ORDER — TRANEXAMIC ACID 1000 MG/10ML IV SOLN
1.5000 mg/kg/h | INTRAVENOUS | Status: AC
  Administered 2016-03-01: 1.5 mg/kg/h via INTRAVENOUS
  Filled 2016-02-29: qty 25

## 2016-02-29 MED ORDER — MAGNESIUM SULFATE 50 % IJ SOLN
40.0000 meq | INTRAMUSCULAR | Status: DC
  Filled 2016-02-29: qty 10

## 2016-02-29 MED ORDER — POTASSIUM CHLORIDE 2 MEQ/ML IV SOLN
80.0000 meq | INTRAVENOUS | Status: DC
  Filled 2016-02-29: qty 40

## 2016-02-29 MED ORDER — EPINEPHRINE HCL 1 MG/ML IJ SOLN
0.0000 ug/min | INTRAVENOUS | Status: DC
  Filled 2016-02-29: qty 4

## 2016-02-29 MED ORDER — ALBUTEROL SULFATE (2.5 MG/3ML) 0.083% IN NEBU
2.5000 mg | INHALATION_SOLUTION | Freq: Once | RESPIRATORY_TRACT | Status: AC
  Administered 2016-02-29: 2.5 mg via RESPIRATORY_TRACT

## 2016-02-29 MED ORDER — TRANEXAMIC ACID (OHS) BOLUS VIA INFUSION
15.0000 mg/kg | INTRAVENOUS | Status: AC
  Administered 2016-03-01: 1701 mg via INTRAVENOUS
  Filled 2016-02-29: qty 1701

## 2016-02-29 MED ORDER — SODIUM CHLORIDE 0.9 % IV SOLN
INTRAVENOUS | Status: DC
  Filled 2016-02-29: qty 30

## 2016-02-29 NOTE — Pre-Procedure Instructions (Addendum)
Hayden Rivera  02/29/2016      [facility] - [place], [place] [address] Phone: [phone] Fax: [phone]  [place], [place] [address] Phone: [phone] Fax: [phone]    Your procedure is scheduled on 03/01/16.  Report to [facility] Admitting at 530 A.M.  Call this number if you have problems the morning of surgery:  [phone]   Remember:  Do not eat food or drink liquids after midnight.  Take these medicines the morning of surgery with A SIP OF WATER , metoprolol(lopressor),nitro if needed  STOP all herbel meds, nsaids (aleve,naproxen,advil,ibuprofen) including vit D, coq10, fish oil starting NOW  Aspirin and plavix per dr   Lazaro Arms not wear jewelry, make-up or nail polish.  Do not wear lotions, powders, or perfumes, or deoderant.  Do not shave 48 hours prior to surgery.  Men may shave face and neck.  Do not bring valuables to the hospital.  [facility] is not responsible for any belongings or valuables.  Contacts, dentures or bridgework may not be worn into surgery.  Leave your suitcase in the car.  After surgery it may be brought to your room.  For patients admitted to the hospital, discharge time will be determined by your treatment team.  Patients discharged the day of surgery will not be allowed to drive home.   Name and phone number of your driver:    Special instructions:   Special Instructions: [facility] - Preparing for Surgery  Before surgery, you can play an important role.  Because skin is not sterile, your skin needs to be as free of germs as possible.  You can reduce the number of germs on you skin by washing with CHG (chlorahexidine gluconate) soap before surgery.  CHG is an antiseptic cleaner which kills germs and bonds with the skin to continue killing germs even after washing.  Please DO NOT use if  you have an allergy to CHG or antibacterial soaps.  If your skin becomes reddened/irritated stop using the CHG and inform your nurse when you arrive at Short Stay.  Do not shave (including legs and underarms) for at least 48 hours prior to the first CHG shower.  You may shave your face.  Please follow these instructions carefully:   1.  Shower with CHG Soap the night before surgery and the morning of Surgery.  2.  If you choose to wash your hair, wash your hair first as usual with your normal shampoo.  3.  After you shampoo, rinse your hair and body thoroughly to remove the Shampoo.  4.  Use CHG as you would any other liquid soap.  You can apply chg directly  to the skin and wash gently with scrungie or a clean washcloth.  5.  Apply the CHG Soap to your body ONLY FROM THE NECK DOWN.  Do not use on open wounds or open sores.  Avoid contact with your eyes ears, mouth and genitals (private parts).  Wash genitals (private parts)       with your normal soap.  6.  Wash thoroughly, paying special attention to the area where your surgery will be performed.  7.  Thoroughly rinse your body with warm water from the neck down.  8.  DO NOT shower/wash with your normal soap after using and rinsing off the CHG Soap.  9.  Pat yourself dry with a clean towel.            10.  Wear clean pajamas.            11.  Place clean sheets on your bed the night of your first shower and do not sleep with pets.  Day of Surgery  Do not apply any lotions/deodorants the morning of surgery.  Please wear clean clothes to the hospital/surgery center.  Please read over the  fact sheets that you were given.

## 2016-02-29 NOTE — Progress Notes (Signed)
  Echocardiogram 2D Echocardiogram has been performed.  Hayden Rivera 02/29/2016, 11:36 AM

## 2016-02-29 NOTE — Anesthesia Preprocedure Evaluation (Addendum)
Anesthesia Evaluation  Patient identified by MRN, date of birth, ID band Patient awake    Reviewed: Allergy & Precautions, NPO status , Patient's Chart, lab work & pertinent test results, reviewed documented beta blocker date and time   Airway Mallampati: III  TM Distance: >3 FB Neck ROM: Full    Dental  (+) Dental Advisory Given   Pulmonary shortness of breath, former smoker,    breath sounds clear to auscultation       Cardiovascular hypertension, + angina + CAD, + Past MI and + Cardiac Stents   Rhythm:Regular Rate:Normal  02/29/16: Left ventricle: The cavity size was normal. There was mild   concentric hypertrophy. Systolic function was normal. The   estimated ejection fraction was in the range of 50% to 55%. There   is akinesis of the mid anteroseptal, and apical septal and   anterior walls. Left ventricular diastolic function parameters   were normal. There was no evidence of elevated ventricular   filling pressure by Doppler parameters. - Aortic valve: Poorly visualized, unable to distinguish the number   of leaflets, severely thickened, mildly calcified leaflets. There   was very mild stenosis. There was no significant regurgitation. - Aortic root: The aortic root was normal in size. - Mitral valve: Structurally normal valve. There was mild   regurgitation. - Left atrium: The atrium was normal in size. - Right ventricle: The cavity size was normal. Wall thickness was   normal. Systolic function was normal. - Tricuspid valve: There was trivial regurgitation. - Pulmonic valve: There was no regurgitation. - Pulmonary arteries: Systolic pressure was within the normal   range. - Inferior vena cava: The vessel was normal in size. - Pericardium, extracardiac: There was no pericardial effusion.   Neuro/Psych negative neurological ROS     GI/Hepatic Neg liver ROS, GERD  ,  Endo/Other  negative endocrine ROS   Renal/GU negative Renal ROS     Musculoskeletal  (+) Arthritis ,   Abdominal   Peds  Hematology negative hematology ROS (+)   Anesthesia Other Findings   Reproductive/Obstetrics                            Lab Results  Component Value Date   WBC 9.9 02/29/2016   HGB 14.8 02/29/2016   HCT 43.6 02/29/2016   MCV 98.0 02/29/2016   PLT 142 (L) 02/29/2016   Lab Results  Component Value Date   CREATININE 1.03 02/29/2016   BUN 14 02/29/2016   NA 140 02/29/2016   K 3.8 02/29/2016   CL 109 02/29/2016   CO2 22 02/29/2016    Anesthesia Physical Anesthesia Plan  ASA: IV  Anesthesia Plan: General   Post-op Pain Management:    Induction: Intravenous  Airway Management Planned: Oral ETT  Additional Equipment: PA Cath, Ultrasound Guidance Line Placement, CVP, Arterial line and TEE  Intra-op Plan:   Post-operative Plan: Post-operative intubation/ventilation  Informed Consent: I have reviewed the patients History and Physical, chart, labs and discussed the procedure including the risks, benefits and alternatives for the proposed anesthesia with the patient or authorized representative who has indicated his/her understanding and acceptance.   Dental advisory given  Plan Discussed with: CRNA and Surgeon  Anesthesia Plan Comments:        Anesthesia Quick Evaluation

## 2016-02-29 NOTE — Progress Notes (Signed)
Pre-op Cardiac Surgery  Carotid Findings:  Right: intimal wall thickening CCA.  Mild mixed plaque origin ICA.  Left: intimal wall thickening CCA.  Bilateral: 1-39% ICA plaquing.  Vertebral artery flow is antegrade.     Upper Extremity Right Left  Brachial Pressures 125T 146T  Radial Waveforms T T  Ulnar Waveforms T T  Palmar Arch (Allen's Test) WNL WNL   Findings:  Normal    Lower  Extremity Right Left  Dorsalis Pedis    Anterior Tibial T T  Posterior Tibial T T  Ankle/Brachial Indices      Findings:  Normal

## 2016-03-01 ENCOUNTER — Encounter (HOSPITAL_COMMUNITY): Payer: Self-pay | Admitting: *Deleted

## 2016-03-01 ENCOUNTER — Inpatient Hospital Stay (HOSPITAL_COMMUNITY): Payer: Commercial Managed Care - HMO | Admitting: Emergency Medicine

## 2016-03-01 ENCOUNTER — Encounter (HOSPITAL_COMMUNITY)
Admission: RE | Disposition: A | Discharge status: Home / Self Care | Payer: Self-pay | Source: Ambulatory Visit | Attending: Cardiothoracic Surgery

## 2016-03-01 ENCOUNTER — Inpatient Hospital Stay (HOSPITAL_COMMUNITY): Payer: Commercial Managed Care - HMO | Admitting: Anesthesiology

## 2016-03-01 ENCOUNTER — Inpatient Hospital Stay (HOSPITAL_COMMUNITY): Payer: Commercial Managed Care - HMO

## 2016-03-01 ENCOUNTER — Inpatient Hospital Stay (HOSPITAL_COMMUNITY)
Admission: RE | Admit: 2016-03-01 | Discharge: 2016-03-07 | DRG: 236 | Disposition: A | Discharge status: Home / Self Care | Payer: Commercial Managed Care - HMO | Source: Ambulatory Visit | Attending: Cardiothoracic Surgery | Admitting: Cardiothoracic Surgery

## 2016-03-01 ENCOUNTER — Ambulatory Visit: Payer: Commercial Managed Care - HMO | Admitting: Physician Assistant

## 2016-03-01 DIAGNOSIS — Z87891 Personal history of nicotine dependence: Secondary | ICD-10-CM | POA: Diagnosis not present

## 2016-03-01 DIAGNOSIS — D696 Thrombocytopenia, unspecified: Secondary | ICD-10-CM | POA: Diagnosis present

## 2016-03-01 DIAGNOSIS — Z823 Family history of stroke: Secondary | ICD-10-CM | POA: Diagnosis not present

## 2016-03-01 DIAGNOSIS — I4891 Unspecified atrial fibrillation: Secondary | ICD-10-CM | POA: Diagnosis not present

## 2016-03-01 DIAGNOSIS — R7303 Prediabetes: Secondary | ICD-10-CM | POA: Diagnosis present

## 2016-03-01 DIAGNOSIS — D62 Acute posthemorrhagic anemia: Secondary | ICD-10-CM | POA: Diagnosis not present

## 2016-03-01 DIAGNOSIS — Z09 Encounter for follow-up examination after completed treatment for conditions other than malignant neoplasm: Secondary | ICD-10-CM

## 2016-03-01 DIAGNOSIS — K219 Gastro-esophageal reflux disease without esophagitis: Secondary | ICD-10-CM | POA: Diagnosis present

## 2016-03-01 DIAGNOSIS — Z8674 Personal history of sudden cardiac arrest: Secondary | ICD-10-CM | POA: Diagnosis not present

## 2016-03-01 DIAGNOSIS — Z7902 Long term (current) use of antithrombotics/antiplatelets: Secondary | ICD-10-CM | POA: Diagnosis not present

## 2016-03-01 DIAGNOSIS — I2510 Atherosclerotic heart disease of native coronary artery without angina pectoris: Secondary | ICD-10-CM

## 2016-03-01 DIAGNOSIS — J90 Pleural effusion, not elsewhere classified: Secondary | ICD-10-CM | POA: Diagnosis not present

## 2016-03-01 DIAGNOSIS — I119 Hypertensive heart disease without heart failure: Secondary | ICD-10-CM | POA: Diagnosis present

## 2016-03-01 DIAGNOSIS — I25110 Atherosclerotic heart disease of native coronary artery with unstable angina pectoris: Principal | ICD-10-CM | POA: Diagnosis present

## 2016-03-01 DIAGNOSIS — I255 Ischemic cardiomyopathy: Secondary | ICD-10-CM | POA: Diagnosis present

## 2016-03-01 DIAGNOSIS — E8770 Fluid overload, unspecified: Secondary | ICD-10-CM | POA: Diagnosis not present

## 2016-03-01 DIAGNOSIS — I252 Old myocardial infarction: Secondary | ICD-10-CM

## 2016-03-01 DIAGNOSIS — Z955 Presence of coronary angioplasty implant and graft: Secondary | ICD-10-CM

## 2016-03-01 DIAGNOSIS — Z8249 Family history of ischemic heart disease and other diseases of the circulatory system: Secondary | ICD-10-CM | POA: Diagnosis not present

## 2016-03-01 DIAGNOSIS — Z23 Encounter for immunization: Secondary | ICD-10-CM | POA: Diagnosis not present

## 2016-03-01 DIAGNOSIS — Z87442 Personal history of urinary calculi: Secondary | ICD-10-CM | POA: Diagnosis not present

## 2016-03-01 DIAGNOSIS — M1990 Unspecified osteoarthritis, unspecified site: Secondary | ICD-10-CM | POA: Diagnosis present

## 2016-03-01 DIAGNOSIS — Z9689 Presence of other specified functional implants: Secondary | ICD-10-CM

## 2016-03-01 DIAGNOSIS — Z951 Presence of aortocoronary bypass graft: Secondary | ICD-10-CM

## 2016-03-01 HISTORY — PX: RADIAL ARTERY HARVEST: SHX5067

## 2016-03-01 HISTORY — PX: CORONARY ARTERY BYPASS GRAFT: SHX141

## 2016-03-01 HISTORY — PX: TEE WITHOUT CARDIOVERSION: SHX5443

## 2016-03-01 LAB — POCT I-STAT 3, ART BLOOD GAS (G3+)
Acid-base deficit: 1 mmol/L (ref 0.0–2.0)
Acid-base deficit: 3 mmol/L — ABNORMAL HIGH (ref 0.0–2.0)
Acid-base deficit: 4 mmol/L — ABNORMAL HIGH (ref 0.0–2.0)
Acid-base deficit: 4 mmol/L — ABNORMAL HIGH (ref 0.0–2.0)
Acid-base deficit: 5 mmol/L — ABNORMAL HIGH (ref 0.0–2.0)
Acid-base deficit: 5 mmol/L — ABNORMAL HIGH (ref 0.0–2.0)
Bicarbonate: 20.6 mmol/L (ref 20.0–28.0)
Bicarbonate: 20.7 mmol/L (ref 20.0–28.0)
Bicarbonate: 21.3 mmol/L (ref 20.0–28.0)
Bicarbonate: 22.1 mmol/L (ref 20.0–28.0)
Bicarbonate: 23.8 mmol/L (ref 20.0–28.0)
Bicarbonate: 24.6 mmol/L (ref 20.0–28.0)
O2 Saturation: 100 %
O2 Saturation: 94 %
O2 Saturation: 94 %
O2 Saturation: 96 %
O2 Saturation: 98 %
O2 Saturation: 99 %
Patient temperature: 36.6
Patient temperature: 36.8
Patient temperature: 37.1
Patient temperature: 37.5
Patient temperature: 37.5
TCO2: 22 mmol/L (ref 0–100)
TCO2: 22 mmol/L (ref 0–100)
TCO2: 23 mmol/L (ref 0–100)
TCO2: 23 mmol/L (ref 0–100)
TCO2: 25 mmol/L (ref 0–100)
TCO2: 26 mmol/L (ref 0–100)
pCO2 arterial: 37.8 mmHg (ref 32.0–48.0)
pCO2 arterial: 39.8 mmHg (ref 32.0–48.0)
pCO2 arterial: 39.9 mmHg (ref 32.0–48.0)
pCO2 arterial: 42.8 mmHg (ref 32.0–48.0)
pCO2 arterial: 43.1 mmHg (ref 32.0–48.0)
pCO2 arterial: 50.9 mmHg — ABNORMAL HIGH (ref 32.0–48.0)
pH, Arterial: 7.277 — ABNORMAL LOW (ref 7.350–7.450)
pH, Arterial: 7.303 — ABNORMAL LOW (ref 7.350–7.450)
pH, Arterial: 7.326 — ABNORMAL LOW (ref 7.350–7.450)
pH, Arterial: 7.346 — ABNORMAL LOW (ref 7.350–7.450)
pH, Arterial: 7.354 (ref 7.350–7.450)
pH, Arterial: 7.364 (ref 7.350–7.450)
pO2, Arterial: 118 mmHg — ABNORMAL HIGH (ref 83.0–108.0)
pO2, Arterial: 130 mmHg — ABNORMAL HIGH (ref 83.0–108.0)
pO2, Arterial: 284 mmHg — ABNORMAL HIGH (ref 83.0–108.0)
pO2, Arterial: 76 mmHg — ABNORMAL LOW (ref 83.0–108.0)
pO2, Arterial: 78 mmHg — ABNORMAL LOW (ref 83.0–108.0)
pO2, Arterial: 85 mmHg (ref 83.0–108.0)

## 2016-03-01 LAB — POCT I-STAT, CHEM 8
BUN: 18 mg/dL (ref 6–20)
BUN: 14 mg/dL (ref 6–20)
BUN: 16 mg/dL (ref 6–20)
BUN: 15 mg/dL (ref 6–20)
BUN: 15 mg/dL (ref 6–20)
BUN: 15 mg/dL (ref 6–20)
Calcium, Ion: 1.23 mmol/L (ref 1.15–1.40)
Calcium, Ion: 1.03 mmol/L — ABNORMAL LOW (ref 1.15–1.40)
Calcium, Ion: 1.19 mmol/L (ref 1.15–1.40)
Calcium, Ion: 1.1 mmol/L — ABNORMAL LOW (ref 1.15–1.40)
Calcium, Ion: 1.11 mmol/L — ABNORMAL LOW (ref 1.15–1.40)
Calcium, Ion: 1.12 mmol/L — ABNORMAL LOW (ref 1.15–1.40)
Chloride: 104 mmol/L (ref 101–111)
Chloride: 100 mmol/L — ABNORMAL LOW (ref 101–111)
Chloride: 104 mmol/L (ref 101–111)
Chloride: 103 mmol/L (ref 101–111)
Chloride: 103 mmol/L (ref 101–111)
Chloride: 105 mmol/L (ref 101–111)
Creatinine, Ser: 0.9 mg/dL (ref 0.61–1.24)
Creatinine, Ser: 0.7 mg/dL (ref 0.61–1.24)
Creatinine, Ser: 0.8 mg/dL (ref 0.61–1.24)
Creatinine, Ser: 0.8 mg/dL (ref 0.61–1.24)
Creatinine, Ser: 0.8 mg/dL (ref 0.61–1.24)
Creatinine, Ser: 0.8 mg/dL (ref 0.61–1.24)
Glucose, Bld: 153 mg/dL — ABNORMAL HIGH (ref 65–99)
Glucose, Bld: 108 mg/dL — ABNORMAL HIGH (ref 65–99)
Glucose, Bld: 116 mg/dL — ABNORMAL HIGH (ref 65–99)
Glucose, Bld: 145 mg/dL — ABNORMAL HIGH (ref 65–99)
Glucose, Bld: 156 mg/dL — ABNORMAL HIGH (ref 65–99)
Glucose, Bld: 162 mg/dL — ABNORMAL HIGH (ref 65–99)
HCT: 40 % (ref 39.0–52.0)
HCT: 30 % — ABNORMAL LOW (ref 39.0–52.0)
HCT: 37 % — ABNORMAL LOW (ref 39.0–52.0)
HCT: 33 % — ABNORMAL LOW (ref 39.0–52.0)
HCT: 31 % — ABNORMAL LOW (ref 39.0–52.0)
HCT: 36 % — ABNORMAL LOW (ref 39.0–52.0)
Hemoglobin: 13.6 g/dL (ref 13.0–17.0)
Hemoglobin: 10.2 g/dL — ABNORMAL LOW (ref 13.0–17.0)
Hemoglobin: 12.6 g/dL — ABNORMAL LOW (ref 13.0–17.0)
Hemoglobin: 11.2 g/dL — ABNORMAL LOW (ref 13.0–17.0)
Hemoglobin: 10.5 g/dL — ABNORMAL LOW (ref 13.0–17.0)
Hemoglobin: 12.2 g/dL — ABNORMAL LOW (ref 13.0–17.0)
Potassium: 4.5 mmol/L (ref 3.5–5.1)
Potassium: 4.2 mmol/L (ref 3.5–5.1)
Potassium: 4.4 mmol/L (ref 3.5–5.1)
Potassium: 4.6 mmol/L (ref 3.5–5.1)
Potassium: 4.3 mmol/L (ref 3.5–5.1)
Potassium: 3.8 mmol/L (ref 3.5–5.1)
Sodium: 141 mmol/L (ref 135–145)
Sodium: 139 mmol/L (ref 135–145)
Sodium: 140 mmol/L (ref 135–145)
Sodium: 139 mmol/L (ref 135–145)
Sodium: 141 mmol/L (ref 135–145)
Sodium: 136 mmol/L (ref 135–145)
TCO2: 26 mmol/L (ref 0–100)
TCO2: 25 mmol/L (ref 0–100)
TCO2: 26 mmol/L (ref 0–100)
TCO2: 23 mmol/L (ref 0–100)
TCO2: 23 mmol/L (ref 0–100)
TCO2: 24 mmol/L (ref 0–100)

## 2016-03-01 LAB — CREATININE, SERUM
Creatinine, Ser: 1.01 mg/dL (ref 0.61–1.24)
GFR calc Af Amer: >60 mL/min (ref >60)
GFR calc non Af Amer: >60 mL/min (ref >60)

## 2016-03-01 LAB — GLUCOSE, CAPILLARY
Glucose-Capillary: 145 mg/dL — ABNORMAL HIGH (ref 65–99)
Glucose-Capillary: 145 mg/dL — ABNORMAL HIGH (ref 65–99)
Glucose-Capillary: 142 mg/dL — ABNORMAL HIGH (ref 65–99)
Glucose-Capillary: 144 mg/dL — ABNORMAL HIGH (ref 65–99)
Glucose-Capillary: 165 mg/dL — ABNORMAL HIGH (ref 65–99)
Glucose-Capillary: 147 mg/dL — ABNORMAL HIGH (ref 65–99)

## 2016-03-01 LAB — PLATELET COUNT: Platelets: 131 10*3/uL — ABNORMAL LOW (ref 150–400)

## 2016-03-01 LAB — CBC
HCT: 40.1 % (ref 39.0–52.0)
HCT: 37.3 % — ABNORMAL LOW (ref 39.0–52.0)
HEMOGLOBIN: 13.3 g/dL (ref 13.0–17.0)
Hemoglobin: 12.3 g/dL — ABNORMAL LOW (ref 13.0–17.0)
MCH: 32.5 pg (ref 26.0–34.0)
MCH: 32 pg (ref 26.0–34.0)
MCHC: 33.2 g/dL (ref 30.0–36.0)
MCHC: 33 g/dL (ref 30.0–36.0)
MCV: 98 fL (ref 78.0–100.0)
MCV: 97.1 fL (ref 78.0–100.0)
PLATELETS: 122 10*3/uL — AB (ref 150–400)
Platelets: 130 10*3/uL — ABNORMAL LOW (ref 150–400)
RBC: 4.09 MIL/uL — ABNORMAL LOW (ref 4.22–5.81)
RBC: 3.84 MIL/uL — ABNORMAL LOW (ref 4.22–5.81)
RDW: 12.9 % (ref 11.5–15.5)
RDW: 12.9 % (ref 11.5–15.5)
WBC: 17.3 10*3/uL — ABNORMAL HIGH (ref 4.0–10.5)
WBC: 16.5 10*3/uL — ABNORMAL HIGH (ref 4.0–10.5)

## 2016-03-01 LAB — APTT: aPTT: 31 seconds (ref 24–36)

## 2016-03-01 LAB — HEMOGLOBIN AND HEMATOCRIT, BLOOD
HCT: 32.6 % — ABNORMAL LOW (ref 39.0–52.0)
Hemoglobin: 11.1 g/dL — ABNORMAL LOW (ref 13.0–17.0)

## 2016-03-01 LAB — PROTIME-INR
INR: 1.67
PROTHROMBIN TIME: 19.9 s — AB (ref 11.4–15.2)

## 2016-03-01 LAB — MAGNESIUM: Magnesium: 2.7 mg/dL — ABNORMAL HIGH (ref 1.7–2.4)

## 2016-03-01 LAB — HEMOGLOBIN A1C
Hgb A1c MFr Bld: 6.2 % — ABNORMAL HIGH (ref 4.8–5.6)
Mean Plasma Glucose: 131 mg/dL

## 2016-03-01 LAB — ECHO TEE: AV Mean grad: 14 mmHg

## 2016-03-01 LAB — ABO/RH: ABO/RH(D): AB POS

## 2016-03-01 SURGERY — CORONARY ARTERY BYPASS GRAFTING (CABG)
Anesthesia: General | Site: Chest

## 2016-03-01 MED ORDER — FENTANYL CITRATE (PF) 250 MCG/5ML IJ SOLN
INTRAMUSCULAR | Status: AC
  Filled 2016-03-01: qty 5

## 2016-03-01 MED ORDER — SODIUM CHLORIDE 0.9% FLUSH
3.0000 mL | Freq: Two times a day (BID) | INTRAVENOUS | Status: DC
  Administered 2016-03-02 – 2016-03-03 (×3): 3 mL via INTRAVENOUS

## 2016-03-01 MED ORDER — BISACODYL 10 MG RE SUPP: 10.0000 mg | Freq: Every day | RECTAL | Status: DC

## 2016-03-01 MED ORDER — ROSUVASTATIN CALCIUM 10 MG PO TABS
10.0000 mg | ORAL_TABLET | Freq: Every evening | ORAL | Status: DC
Start: 2016-03-01 — End: 2016-03-07
  Administered 2016-03-02 – 2016-03-06 (×5): 10 mg via ORAL
  Filled 2016-03-01 (×5): qty 1

## 2016-03-01 MED ORDER — EPHEDRINE 5 MG/ML INJ
INTRAVENOUS | Status: AC
  Filled 2016-03-01: qty 10

## 2016-03-01 MED ORDER — CHLORHEXIDINE GLUCONATE 0.12 % MT SOLN: 15.0000 mL | Freq: Once | OROMUCOSAL | Status: DC

## 2016-03-01 MED ORDER — OXYCODONE HCL 5 MG PO TABS
5.0000 mg | ORAL_TABLET | ORAL | Status: DC | PRN
  Administered 2016-03-02 – 2016-03-03 (×7): 10 mg via ORAL
  Filled 2016-03-01 (×7): qty 2

## 2016-03-01 MED ORDER — ONDANSETRON HCL 4 MG/2ML IJ SOLN
4.0000 mg | Freq: Four times a day (QID) | INTRAMUSCULAR | Status: DC | PRN
  Administered 2016-03-02: 4 mg via INTRAVENOUS
  Filled 2016-03-01: qty 2

## 2016-03-01 MED ORDER — SUCCINYLCHOLINE CHLORIDE 20 MG/ML IJ SOLN
INTRAMUSCULAR | Status: DC | PRN
  Administered 2016-03-01: 120 mg via INTRAVENOUS

## 2016-03-01 MED ORDER — ROCURONIUM BROMIDE 10 MG/ML (PF) SYRINGE
PREFILLED_SYRINGE | INTRAVENOUS | Status: DC | PRN
  Administered 2016-03-01 (×3): 50 mg via INTRAVENOUS
  Administered 2016-03-01: 100 mg via INTRAVENOUS
  Administered 2016-03-01: 50 mg via INTRAVENOUS

## 2016-03-01 MED ORDER — TRAMADOL HCL 50 MG PO TABS
50.0000 mg | ORAL_TABLET | ORAL | Status: DC | PRN
  Administered 2016-03-02 – 2016-03-03 (×4): 100 mg via ORAL
  Administered 2016-03-03 – 2016-03-04 (×2): 50 mg via ORAL
  Filled 2016-03-01: qty 2
  Filled 2016-03-01: qty 1
  Filled 2016-03-01 (×2): qty 2
  Filled 2016-03-01: qty 1
  Filled 2016-03-01: qty 2

## 2016-03-01 MED ORDER — POTASSIUM CHLORIDE 10 MEQ/50ML IV SOLN: 10.0000 meq | INTRAVENOUS | Status: AC

## 2016-03-01 MED ORDER — INSULIN REGULAR BOLUS VIA INFUSION
0.0000 [IU] | Freq: Three times a day (TID) | INTRAVENOUS | Status: AC
  Administered 2016-03-02: 2 [IU] via INTRAVENOUS
  Filled 2016-03-01: qty 10

## 2016-03-01 MED ORDER — DEXMEDETOMIDINE HCL IN NACL 200 MCG/50ML IV SOLN
0.0000 ug/kg/h | INTRAVENOUS | Status: DC
  Filled 2016-03-01 (×3): qty 50

## 2016-03-01 MED ORDER — DOCUSATE SODIUM 100 MG PO CAPS
200.0000 mg | ORAL_CAPSULE | Freq: Every day | ORAL | Status: DC
  Administered 2016-03-02 – 2016-03-03 (×2): 200 mg via ORAL
  Filled 2016-03-01 (×2): qty 2

## 2016-03-01 MED ORDER — ASPIRIN EC 325 MG PO TBEC
325.0000 mg | DELAYED_RELEASE_TABLET | Freq: Every day | ORAL | Status: DC
  Administered 2016-03-02 – 2016-03-03 (×2): 325 mg via ORAL
  Filled 2016-03-01 (×2): qty 1

## 2016-03-01 MED ORDER — MILRINONE LACTATE IN DEXTROSE 20-5 MG/100ML-% IV SOLN
0.3750 ug/kg/min | INTRAVENOUS | Status: DC
  Administered 2016-03-01 – 2016-03-02 (×2): 0.375 ug/kg/min via INTRAVENOUS
  Filled 2016-03-01 (×2): qty 100

## 2016-03-01 MED ORDER — ROCURONIUM BROMIDE 10 MG/ML (PF) SYRINGE
PREFILLED_SYRINGE | INTRAVENOUS | Status: AC
  Filled 2016-03-01: qty 10

## 2016-03-01 MED ORDER — MORPHINE SULFATE (PF) 2 MG/ML IV SOLN: 1.0000 mg | INTRAVENOUS | Status: AC | PRN

## 2016-03-01 MED ORDER — VANCOMYCIN HCL IN DEXTROSE 1-5 GM/200ML-% IV SOLN
1000.0000 mg | Freq: Once | INTRAVENOUS | Status: AC
  Administered 2016-03-01: 1000 mg via INTRAVENOUS
  Filled 2016-03-01: qty 200

## 2016-03-01 MED ORDER — ALBUMIN HUMAN 5 % IV SOLN
INTRAVENOUS | Status: DC | PRN
  Administered 2016-03-01: 14:00:00 via INTRAVENOUS

## 2016-03-01 MED ORDER — LACTATED RINGERS IV SOLN: 500.0000 mL | Freq: Once | INTRAVENOUS | Status: DC | PRN

## 2016-03-01 MED ORDER — NITROGLYCERIN IN D5W 200-5 MCG/ML-% IV SOLN: 0.0000 ug/min | INTRAVENOUS | Status: DC

## 2016-03-01 MED ORDER — LACTATED RINGERS IV SOLN: INTRAVENOUS | Status: DC

## 2016-03-01 MED ORDER — SODIUM CHLORIDE 0.9% FLUSH
3.0000 mL | INTRAVENOUS | Status: DC | PRN
Start: 2016-03-02 — End: 2016-03-04

## 2016-03-01 MED ORDER — SODIUM CHLORIDE 0.9 % IJ SOLN
OROMUCOSAL | Status: DC | PRN
  Administered 2016-03-01 (×3): 4 mL via TOPICAL

## 2016-03-01 MED ORDER — CHLORHEXIDINE GLUCONATE 0.12 % MT SOLN
15.0000 mL | OROMUCOSAL | Status: AC
  Administered 2016-03-01: 15 mL via OROMUCOSAL

## 2016-03-01 MED ORDER — TRANEXAMIC ACID 1000 MG/10ML IV SOLN
1.5000 mg/kg/h | INTRAVENOUS | Status: DC
  Filled 2016-03-01: qty 25

## 2016-03-01 MED ORDER — SODIUM CHLORIDE 0.9 % IV SOLN
INTRAVENOUS | Status: DC | PRN
  Administered 2016-03-01: 15:00:00 via INTRAVENOUS

## 2016-03-01 MED ORDER — FENTANYL CITRATE (PF) 250 MCG/5ML IJ SOLN
INTRAMUSCULAR | Status: AC
  Filled 2016-03-01: qty 25

## 2016-03-01 MED ORDER — METOPROLOL TARTRATE 25 MG/10 ML ORAL SUSPENSION: 12.5000 mg | Freq: Two times a day (BID) | ORAL | Status: DC

## 2016-03-01 MED ORDER — HEMOSTATIC AGENTS (NO CHARGE) OPTIME
TOPICAL | Status: DC | PRN
  Administered 2016-03-01 (×2): 1 via TOPICAL

## 2016-03-01 MED ORDER — LIDOCAINE 2% (20 MG/ML) 5 ML SYRINGE
INTRAMUSCULAR | Status: AC
  Filled 2016-03-01: qty 5

## 2016-03-01 MED ORDER — ORAL CARE MOUTH RINSE
15.0000 mL | Freq: Four times a day (QID) | OROMUCOSAL | Status: DC
  Administered 2016-03-02 – 2016-03-04 (×5): 15 mL via OROMUCOSAL

## 2016-03-01 MED ORDER — ASPIRIN 81 MG PO CHEW: 324.0000 mg | CHEWABLE_TABLET | Freq: Every day | ORAL | Status: DC

## 2016-03-01 MED ORDER — ACETAMINOPHEN 160 MG/5ML PO SOLN: 1000.0000 mg | Freq: Four times a day (QID) | ORAL | Status: DC

## 2016-03-01 MED ORDER — MIDAZOLAM HCL 5 MG/5ML IJ SOLN
INTRAMUSCULAR | Status: DC | PRN
  Administered 2016-03-01: 2 mg via INTRAVENOUS
  Administered 2016-03-01 (×2): 4 mg via INTRAVENOUS

## 2016-03-01 MED ORDER — 0.9 % SODIUM CHLORIDE (POUR BTL) OPTIME
TOPICAL | Status: DC | PRN
  Administered 2016-03-01: 6000 mL

## 2016-03-01 MED ORDER — MAGNESIUM SULFATE 4 GM/100ML IV SOLN
4.0000 g | Freq: Once | INTRAVENOUS | Status: AC
  Administered 2016-03-01: 4 g via INTRAVENOUS
  Filled 2016-03-01: qty 100

## 2016-03-01 MED ORDER — ALBUTEROL SULFATE HFA 108 (90 BASE) MCG/ACT IN AERS
INHALATION_SPRAY | RESPIRATORY_TRACT | Status: DC | PRN
  Administered 2016-03-01 (×4): 2 via RESPIRATORY_TRACT

## 2016-03-01 MED ORDER — HEPARIN SODIUM (PORCINE) 1000 UNIT/ML IJ SOLN
INTRAMUSCULAR | Status: DC | PRN
  Administered 2016-03-01: 28000 [IU] via INTRAVENOUS
  Administered 2016-03-01: 3000 [IU] via INTRAVENOUS

## 2016-03-01 MED ORDER — CHLORHEXIDINE GLUCONATE 0.12% ORAL RINSE (MEDLINE KIT)
15.0000 mL | Freq: Two times a day (BID) | OROMUCOSAL | Status: DC
  Administered 2016-03-01 – 2016-03-02 (×3): 15 mL via OROMUCOSAL

## 2016-03-01 MED ORDER — PHENYLEPHRINE HCL 10 MG/ML IJ SOLN
0.0000 ug/min | INTRAVENOUS | Status: DC
  Filled 2016-03-01 (×2): qty 2

## 2016-03-01 MED ORDER — CHLORHEXIDINE GLUCONATE 4 % EX LIQD: 30.0000 mL | CUTANEOUS | Status: DC

## 2016-03-01 MED ORDER — LACTATED RINGERS IV SOLN
INTRAVENOUS | Status: DC | PRN
  Administered 2016-03-01 (×3): via INTRAVENOUS

## 2016-03-01 MED ORDER — ARTIFICIAL TEARS OP OINT
TOPICAL_OINTMENT | OPHTHALMIC | Status: DC | PRN
  Administered 2016-03-01: 1 via OPHTHALMIC

## 2016-03-01 MED ORDER — PANTOPRAZOLE SODIUM 40 MG PO TBEC: 40.0000 mg | DELAYED_RELEASE_TABLET | Freq: Every day | ORAL | Status: DC

## 2016-03-01 MED ORDER — ACETAMINOPHEN 500 MG PO TABS
1000.0000 mg | ORAL_TABLET | Freq: Four times a day (QID) | ORAL | Status: DC
Start: 2016-03-02 — End: 2016-03-04
  Administered 2016-03-01 – 2016-03-04 (×10): 1000 mg via ORAL
  Filled 2016-03-01 (×8): qty 2

## 2016-03-01 MED ORDER — ACETAMINOPHEN 650 MG RE SUPP
650.0000 mg | Freq: Once | RECTAL | Status: AC
  Administered 2016-03-01: 650 mg via RECTAL

## 2016-03-01 MED ORDER — MORPHINE SULFATE (PF) 2 MG/ML IV SOLN
2.0000 mg | INTRAVENOUS | Status: DC | PRN
  Administered 2016-03-01: 2 mg via INTRAVENOUS
  Administered 2016-03-02: 4 mg via INTRAVENOUS
  Administered 2016-03-02: 2 mg via INTRAVENOUS
  Administered 2016-03-02: 4 mg via INTRAVENOUS
  Administered 2016-03-02: 2 mg via INTRAVENOUS
  Administered 2016-03-02 – 2016-03-03 (×5): 4 mg via INTRAVENOUS
  Filled 2016-03-01 (×8): qty 2
  Filled 2016-03-01: qty 1

## 2016-03-01 MED ORDER — MIDAZOLAM HCL 10 MG/2ML IJ SOLN
INTRAMUSCULAR | Status: AC
  Filled 2016-03-01: qty 2

## 2016-03-01 MED ORDER — METOPROLOL TARTRATE 12.5 MG HALF TABLET: 12.5000 mg | ORAL_TABLET | Freq: Once | ORAL | Status: DC

## 2016-03-01 MED ORDER — FENTANYL CITRATE (PF) 250 MCG/5ML IJ SOLN
INTRAMUSCULAR | Status: DC | PRN
  Administered 2016-03-01: 50 ug via INTRAVENOUS
  Administered 2016-03-01 (×2): 100 ug via INTRAVENOUS
  Administered 2016-03-01 (×5): 50 ug via INTRAVENOUS
  Administered 2016-03-01: 550 ug via INTRAVENOUS
  Administered 2016-03-01 (×9): 50 ug via INTRAVENOUS
  Administered 2016-03-01 (×2): 100 ug via INTRAVENOUS
  Administered 2016-03-01: 50 ug via INTRAVENOUS

## 2016-03-01 MED ORDER — PHENYLEPHRINE 40 MCG/ML (10ML) SYRINGE FOR IV PUSH (FOR BLOOD PRESSURE SUPPORT)
PREFILLED_SYRINGE | INTRAVENOUS | Status: AC
  Filled 2016-03-01: qty 10

## 2016-03-01 MED ORDER — ALBUMIN HUMAN 5 % IV SOLN
250.0000 mL | INTRAVENOUS | Status: AC | PRN
  Administered 2016-03-01 (×2): 250 mL via INTRAVENOUS

## 2016-03-01 MED ORDER — SODIUM CHLORIDE 0.9 % IV SOLN
INTRAVENOUS | Status: AC
  Filled 2016-03-01: qty 2.5

## 2016-03-01 MED ORDER — METOPROLOL TARTRATE 12.5 MG HALF TABLET
12.5000 mg | ORAL_TABLET | Freq: Two times a day (BID) | ORAL | Status: DC
  Administered 2016-03-02 (×2): 12.5 mg via ORAL
  Filled 2016-03-01 (×2): qty 1

## 2016-03-01 MED ORDER — PROPOFOL 10 MG/ML IV BOLUS
INTRAVENOUS | Status: DC | PRN
  Administered 2016-03-01: 50 mg via INTRAVENOUS
  Administered 2016-03-01: 30 mg via INTRAVENOUS

## 2016-03-01 MED ORDER — SODIUM BICARBONATE 8.4 % IV SOLN
25.0000 meq | Freq: Once | INTRAVENOUS | Status: AC
  Administered 2016-03-01: 25 meq via INTRAVENOUS

## 2016-03-01 MED ORDER — PROPOFOL 10 MG/ML IV BOLUS
INTRAVENOUS | Status: AC
  Filled 2016-03-01: qty 20

## 2016-03-01 MED ORDER — PANTOPRAZOLE SODIUM 40 MG PO TBEC
40.0000 mg | DELAYED_RELEASE_TABLET | Freq: Every day | ORAL | Status: DC
  Administered 2016-03-03: 40 mg via ORAL
  Filled 2016-03-01: qty 1

## 2016-03-01 MED ORDER — SODIUM CHLORIDE 0.9 % IV SOLN
250.0000 mL | INTRAVENOUS | Status: DC
  Administered 2016-03-02: 250 mL via INTRAVENOUS

## 2016-03-01 MED ORDER — LEVOFLOXACIN IN D5W 750 MG/150ML IV SOLN
750.0000 mg | INTRAVENOUS | Status: AC
  Administered 2016-03-02: 750 mg via INTRAVENOUS
  Filled 2016-03-01 (×2): qty 150

## 2016-03-01 MED ORDER — MIDAZOLAM HCL 2 MG/2ML IJ SOLN: 2.0000 mg | INTRAMUSCULAR | Status: DC | PRN

## 2016-03-01 MED ORDER — MILRINONE LACTATE IN DEXTROSE 20-5 MG/100ML-% IV SOLN
0.3750 ug/kg/min | INTRAVENOUS | Status: AC
  Administered 2016-03-01: 0.375 ug/kg/min via INTRAVENOUS
  Filled 2016-03-01: qty 100

## 2016-03-01 MED ORDER — LIDOCAINE 2% (20 MG/ML) 5 ML SYRINGE
INTRAMUSCULAR | Status: DC | PRN
  Administered 2016-03-01: 100 mg via INTRAVENOUS

## 2016-03-01 MED ORDER — METOCLOPRAMIDE HCL 5 MG/ML IJ SOLN
10.0000 mg | Freq: Four times a day (QID) | INTRAMUSCULAR | Status: AC
  Administered 2016-03-01 – 2016-03-02 (×4): 10 mg via INTRAVENOUS
  Filled 2016-03-01 (×2): qty 2

## 2016-03-01 MED ORDER — SODIUM CHLORIDE 0.45 % IV SOLN: INTRAVENOUS | Status: DC | PRN

## 2016-03-01 MED ORDER — PROTAMINE SULFATE 10 MG/ML IV SOLN
INTRAVENOUS | Status: DC | PRN
  Administered 2016-03-01: 280 mg via INTRAVENOUS
  Administered 2016-03-01: 25 mg via INTRAVENOUS

## 2016-03-01 MED ORDER — SODIUM CHLORIDE 0.9 % IV SOLN: INTRAVENOUS | Status: DC

## 2016-03-01 MED ORDER — METOPROLOL TARTRATE 5 MG/5ML IV SOLN
2.5000 mg | INTRAVENOUS | Status: DC | PRN
  Administered 2016-03-02 – 2016-03-03 (×2): 5 mg via INTRAVENOUS
  Filled 2016-03-01 (×2): qty 5

## 2016-03-01 MED ORDER — BISACODYL 5 MG PO TBEC
10.0000 mg | DELAYED_RELEASE_TABLET | Freq: Every day | ORAL | Status: DC
  Administered 2016-03-02 – 2016-03-03 (×2): 10 mg via ORAL
  Filled 2016-03-01 (×2): qty 2

## 2016-03-01 MED ORDER — EPHEDRINE SULFATE 50 MG/ML IJ SOLN
INTRAMUSCULAR | Status: DC | PRN
  Administered 2016-03-01: 10 mg via INTRAVENOUS

## 2016-03-01 MED ORDER — HEPARIN SODIUM (PORCINE) 1000 UNIT/ML IJ SOLN
INTRAMUSCULAR | Status: AC
  Filled 2016-03-01: qty 1

## 2016-03-01 MED ORDER — ACETAMINOPHEN 160 MG/5ML PO SOLN: 650.0000 mg | Freq: Once | ORAL | Status: AC

## 2016-03-01 MED ORDER — FAMOTIDINE IN NACL 20-0.9 MG/50ML-% IV SOLN
20.0000 mg | Freq: Two times a day (BID) | INTRAVENOUS | Status: AC
  Administered 2016-03-01 (×2): 20 mg via INTRAVENOUS
  Filled 2016-03-01: qty 50

## 2016-03-01 MED FILL — Potassium Chloride Inj 2 mEq/ML: INTRAVENOUS | Qty: 40 | Status: AC

## 2016-03-01 MED FILL — Magnesium Sulfate Inj 50%: INTRAMUSCULAR | Qty: 10 | Status: AC

## 2016-03-01 MED FILL — Heparin Sodium (Porcine) Inj 1000 Unit/ML: INTRAMUSCULAR | Qty: 30 | Status: AC

## 2016-03-01 SURGICAL SUPPLY — 91 items
APPLIER CLIP 9.375 SM OPEN (CLIP)
BAG DECANTER FOR FLEXI CONT (MISCELLANEOUS) ×4 IMPLANT
BANDAGE ACE 4X5 VEL STRL LF (GAUZE/BANDAGES/DRESSINGS) IMPLANT
BANDAGE ACE 6X5 VEL STRL LF (GAUZE/BANDAGES/DRESSINGS) IMPLANT
BANDAGE ELASTIC 4 VELCRO ST LF (GAUZE/BANDAGES/DRESSINGS) ×8 IMPLANT
BANDAGE ELASTIC 6 VELCRO ST LF (GAUZE/BANDAGES/DRESSINGS) ×4 IMPLANT
BLADE STERNUM SYSTEM 6 (BLADE) ×4 IMPLANT
BLADE SURG 11 STRL SS (BLADE) ×4 IMPLANT
BLADE SURG 15 STRL LF DISP TIS (BLADE) ×3 IMPLANT
BLADE SURG 15 STRL SS (BLADE) ×1
BNDG GAUZE ELAST 4 BULKY (GAUZE/BANDAGES/DRESSINGS) ×8 IMPLANT
CANISTER SUCTION 2500CC (MISCELLANEOUS) ×4 IMPLANT
CATH CPB KIT GERHARDT (MISCELLANEOUS) ×4 IMPLANT
CATH THORACIC 28FR (CATHETERS) ×4 IMPLANT
CLIP APPLIE 9.375 SM OPEN (CLIP) IMPLANT
CLIP TI MEDIUM 24 (CLIP) ×4 IMPLANT
CLIP TI WIDE RED SMALL 24 (CLIP) ×4 IMPLANT
COVER BACK TABLE 60X90IN (DRAPES) ×4 IMPLANT
COVER MAYO STAND STRL (DRAPES) ×4 IMPLANT
CRADLE DONUT ADULT HEAD (MISCELLANEOUS) ×4 IMPLANT
DERMABOND ADVANCED (GAUZE/BANDAGES/DRESSINGS) ×1
DERMABOND ADVANCED .7 DNX12 (GAUZE/BANDAGES/DRESSINGS) ×3 IMPLANT
DRAIN CHANNEL 28F RND 3/8 FF (WOUND CARE) ×4 IMPLANT
DRAPE CARDIOVASCULAR INCISE (DRAPES) ×1
DRAPE EXTREMITY T 121X128X90 (DRAPE) IMPLANT
DRAPE PROXIMA HALF (DRAPES) IMPLANT
DRAPE SLUSH/WARMER DISC (DRAPES) ×4 IMPLANT
DRAPE SRG 135X102X78XABS (DRAPES) ×3 IMPLANT
DRSG AQUACEL AG ADV 3.5X14 (GAUZE/BANDAGES/DRESSINGS) ×4 IMPLANT
ELECT BLADE 4.0 EZ CLEAN MEGAD (MISCELLANEOUS) ×4
ELECT REM PT RETURN 9FT ADLT (ELECTROSURGICAL) ×8
ELECTRODE BLDE 4.0 EZ CLN MEGD (MISCELLANEOUS) ×3 IMPLANT
ELECTRODE REM PT RTRN 9FT ADLT (ELECTROSURGICAL) ×6 IMPLANT
FELT TEFLON 1X6 (MISCELLANEOUS) ×4 IMPLANT
GAUZE SPONGE 4X4 12PLY STRL (GAUZE/BANDAGES/DRESSINGS) IMPLANT
GEL ULTRASOUND 20GR AQUASONIC (MISCELLANEOUS) ×8 IMPLANT
GLOVE BIO SURGEON STRL SZ 6.5 (GLOVE) ×12 IMPLANT
GLOVE BIOGEL M 6.5 STRL (GLOVE) ×12 IMPLANT
GLOVE BIOGEL M 7.0 STRL (GLOVE) ×12 IMPLANT
GLOVE BIOGEL PI IND STRL 6.5 (GLOVE) ×9 IMPLANT
GLOVE BIOGEL PI INDICATOR 6.5 (GLOVE) ×3
GOWN STRL REUS W/ TWL LRG LVL3 (GOWN DISPOSABLE) ×24 IMPLANT
GOWN STRL REUS W/TWL LRG LVL3 (GOWN DISPOSABLE) ×8
HARMONIC SHEARS 14CM COAG (MISCELLANEOUS) ×8 IMPLANT
HEMOSTAT POWDER SURGIFOAM 1G (HEMOSTASIS) ×12 IMPLANT
HEMOSTAT SURGICEL 2X14 (HEMOSTASIS) ×4 IMPLANT
KIT BASIN OR (CUSTOM PROCEDURE TRAY) ×4 IMPLANT
KIT CATH SUCT 8FR (CATHETERS) ×4 IMPLANT
KIT ROOM TURNOVER OR (KITS) ×4 IMPLANT
KIT SUCTION CATH 14FR (SUCTIONS) ×8 IMPLANT
KIT VASOVIEW HEMOPRO VH 3000 (KITS) ×4 IMPLANT
LEAD PACING MYOCARDI (MISCELLANEOUS) ×4 IMPLANT
MARKER GRAFT CORONARY BYPASS (MISCELLANEOUS) ×12 IMPLANT
NS IRRIG 1000ML POUR BTL (IV SOLUTION) ×24 IMPLANT
PACK OPEN HEART (CUSTOM PROCEDURE TRAY) ×4 IMPLANT
PAD ARMBOARD 7.5X6 YLW CONV (MISCELLANEOUS) ×8 IMPLANT
PAD ELECT DEFIB RADIOL ZOLL (MISCELLANEOUS) ×4 IMPLANT
PENCIL BUTTON HOLSTER BLD 10FT (ELECTRODE) ×4 IMPLANT
PUNCH AORTIC ROTATE  4.5MM 8IN (MISCELLANEOUS) ×4 IMPLANT
SET CARDIOPLEGIA MPS 5001102 (MISCELLANEOUS) ×4 IMPLANT
SOLUTION ANTI FOG 6CC (MISCELLANEOUS) ×4 IMPLANT
SPONGE GAUZE 4X4 12PLY STER LF (GAUZE/BANDAGES/DRESSINGS) ×12 IMPLANT
SPONGE LAP 18X18 X RAY DECT (DISPOSABLE) ×24 IMPLANT
SUT BONE WAX W31G (SUTURE) ×4 IMPLANT
SUT MNCRL AB 4-0 PS2 18 (SUTURE) ×4 IMPLANT
SUT PROLENE 3 0 SH1 36 (SUTURE) ×8 IMPLANT
SUT PROLENE 4 0 TF (SUTURE) ×8 IMPLANT
SUT PROLENE 6 0 C 1 30 (SUTURE) ×4 IMPLANT
SUT PROLENE 6 0 CC (SUTURE) ×12 IMPLANT
SUT PROLENE 7 0 BV1 MDA (SUTURE) ×4 IMPLANT
SUT PROLENE 7.0 RB 3 (SUTURE) ×8 IMPLANT
SUT PROLENE 8 0 BV175 6 (SUTURE) ×16 IMPLANT
SUT SILK 2 0 SH CR/8 (SUTURE) ×4 IMPLANT
SUT STEEL 6MS V (SUTURE) ×4 IMPLANT
SUT STEEL SZ 6 DBL 3X14 BALL (SUTURE) ×8 IMPLANT
SUT VIC AB 1 CTX 18 (SUTURE) ×8 IMPLANT
SUT VIC AB 2-0 CT1 27 (SUTURE) ×2
SUT VIC AB 2-0 CT1 TAPERPNT 27 (SUTURE) ×6 IMPLANT
SUT VIC AB 3-0 SH 27 (SUTURE)
SUT VIC AB 3-0 SH 27X BRD (SUTURE) IMPLANT
SUT VIC AB 3-0 X1 27 (SUTURE) ×4 IMPLANT
SUTURE E-PAK OPEN HEART (SUTURE) ×4 IMPLANT
SYR 50ML SLIP (SYRINGE) IMPLANT
SYSTEM SAHARA CHEST DRAIN ATS (WOUND CARE) ×4 IMPLANT
TAPE CLOTH SURG 4X10 WHT LF (GAUZE/BANDAGES/DRESSINGS) ×4 IMPLANT
TOWEL OR 17X24 6PK STRL BLUE (TOWEL DISPOSABLE) ×8 IMPLANT
TOWEL OR 17X26 10 PK STRL BLUE (TOWEL DISPOSABLE) ×8 IMPLANT
TRAY FOLEY IC TEMP SENS 16FR (CATHETERS) ×4 IMPLANT
TUBING INSUFFLATION (TUBING) ×4 IMPLANT
UNDERPAD 30X30 (UNDERPADS AND DIAPERS) ×8 IMPLANT
WATER STERILE IRR 1000ML POUR (IV SOLUTION) ×8 IMPLANT

## 2016-03-01 NOTE — Progress Notes (Signed)
Patient extubated per rapid wean protocol at 2145. Patient ABG and parameters within normal limits (NIF -20, VC 1.3L). Patient extubated to [facility] and is tolerating well at this time. All vitals within normal limits.

## 2016-03-01 NOTE — Transfer of Care (Signed)
Immediate Anesthesia Transfer of Care Note  Patient: Hayden Rivera  Procedure(s) Performed: Procedure(s): CORONARY ARTERY BYPASS GRAFTING (CABG)x3 with endoscopic harvesting of right saphenous vein -LIMA to LAD -SVG to LEFT CIRCUMFLEX -RADIAL ARTERY to RAMUS INTERMEDIA (N/A) TRANSESOPHAGEAL ECHOCARDIOGRAM (TEE) (N/A) LEFT RADIAL ARTERY HARVEST (Left)  Patient Location: SICU  Anesthesia Type:General  Level of Consciousness: sedated, patient cooperative and Patient remains intubated per anesthesia plan  Airway & Oxygen Therapy: Patient remains intubated per anesthesia plan and Patient placed on Ventilator (see vital sign flow sheet for setting)  Post-op Assessment: Report given to RN and Post -op Vital signs reviewed and stable  Post vital signs: Reviewed and stable  Last Vitals:  Vitals:   03/01/16 0634  BP: (!) 149/79  Pulse: (!) 54  Resp: 20  Temp: 37.1 C    Last Pain:  Vitals:   03/01/16 0634  TempSrc: Oral      Patients Stated Pain Goal: 7 ([phone] [phone])  Complications: No apparent anesthesia complications

## 2016-03-01 NOTE — Anesthesia Procedure Notes (Signed)
Procedure Name: Intubation Date/Time: 03/01/2016 7:52 AM Performed by: Lance Coon Pre-anesthesia Checklist: Patient identified, Emergency Drugs available, Patient being monitored, Timeout performed and Suction available Patient Re-evaluated:Patient Re-evaluated prior to inductionOxygen Delivery Method: Circle system utilized Preoxygenation: Pre-oxygenation with 100% oxygen Intubation Type: IV induction Ventilation: Mask ventilation without difficulty Laryngoscope Size: Miller and 2 Grade View: Grade I Tube type: Oral Tube size: 8.0 mm Number of attempts: 1 Airway Equipment and Method: Stylet Placement Confirmation: ETT inserted through vocal cords under direct vision,  positive ETCO2 and breath sounds checked- equal and bilateral Secured at: 23 cm Tube secured with: Tape Dental Injury: Teeth and Oropharynx as per pre-operative assessment

## 2016-03-01 NOTE — Progress Notes (Signed)
Patient started on rapid wean protocol at 2040.

## 2016-03-01 NOTE — Progress Notes (Signed)
T[facility] BRIEF SICU PROGRESS NOTE  Day of Surgery  S/P Procedure(s) (LRB): CORONARY ARTERY BYPASS GRAFTING (CABG)x3 with endoscopic harvesting of right saphenous vein -LIMA to LAD -SVG to LEFT CIRCUMFLEX -RADIAL ARTERY to RAMUS INTERMEDIA (N/A) TRANSESOPHAGEAL ECHOCARDIOGRAM (TEE) (N/A) LEFT RADIAL ARTERY HARVEST (Left)   AAI paced w/ stable hemodynamics, no drips O2 sats 99-100% but on 100% FiO2 and PEEP=7 Chest tube output low UOP adequate Labs okay  Plan: Continue routine early postop  Rexene Alberts, MD 03/01/2016 6:18 PM

## 2016-03-01 NOTE — Anesthesia Procedure Notes (Signed)
Procedures

## 2016-03-01 NOTE — Anesthesia Postprocedure Evaluation (Signed)
Anesthesia Post Note  Patient: Hayden Rivera  Procedure(s) Performed: Procedure(s) (LRB): CORONARY ARTERY BYPASS GRAFTING (CABG)x3 with endoscopic harvesting of right saphenous vein -LIMA to LAD -SVG to LEFT CIRCUMFLEX -RADIAL ARTERY to RAMUS INTERMEDIA (N/A) TRANSESOPHAGEAL ECHOCARDIOGRAM (TEE) (N/A) LEFT RADIAL ARTERY HARVEST (Left)  Patient location during evaluation: SICU Anesthesia Type: General Level of consciousness: sedated Pain management: pain level controlled Vital Signs Assessment: post-procedure vital signs reviewed and stable Respiratory status: patient remains intubated per anesthesia plan Cardiovascular status: stable Anesthetic complications: no    Last Vitals:  Vitals:   03/01/16 1527 03/01/16 1546  BP: 117/72   Pulse: 80   Resp: 12   Temp:  36.5 C    Last Pain:  Vitals:   03/01/16 [id number]  TempSrc: Leatrice Jewels                 Tiajuana Amass

## 2016-03-01 NOTE — Brief Op Note (Addendum)
      [address]       [place] [address]             [phone]      03/01/2016  1:03 PM  PATIENT:  Hayden Rivera  65 y.o. male  PRE-OPERATIVE DIAGNOSIS:  CAD  POST-OPERATIVE DIAGNOSIS:  CAD  PROCEDURE:  Procedure(s):  CORONARY ARTERY BYPASS GRAFTING x 3 -LIMA to LAD -SVG to LEFT CIRCUMFLEX -RADIAL ARTERY to RAMUS INTERMEDIA  ENDOSCOPIC HARVEST GREATER SAPHENOUS VEIN GRAFT -Right Leg  LEFT RADIAL ARTERY HARVEST (Left)  TRANSESOPHAGEAL ECHOCARDIOGRAM (TEE) (N/A)   SURGEON:  Surgeon(s) and Role:    * Grace Isaac, MD - Primary  PHYSICIAN ASSISTANT: Erin Barrett PA-C  ANESTHESIA:   general  EBL:  Total I/O In: -  Out: 500 [Urine:500]  BLOOD ADMINISTERED: CELLSAVER  DRAINS: Left Pleural Chest Tube, Mediastinal Chest drains   LOCAL MEDICATIONS USED:  NONE  SPECIMEN:  No Specimen  DISPOSITION OF SPECIMEN:  N/A   DICTATION: .Dragon Dictation  PLAN OF CARE: Admit to inpatient   PATIENT DISPOSITION:  ICU - intubated and hemodynamically stable.   Delay start of Pharmacological VTE agent (>24hrs) due to surgical blood loss or risk of bleeding: yes

## 2016-03-01 NOTE — H&P (Signed)
[address]       [place] [address]             [phone]                    Obert Schlick [facility] Medical Record [id number] Date of Birth: [birth year]  Referring: Dr Aundra Dubin  Primary Care: Kenn File, MD  Chief Complaint:    CAD   History of Present Illness:    Kongmeng Pulkrabek 65 y.o. male was  seen in the office  Referred by  cardiology for coronary artery bypass grafting consideration. Patient has a long history of known coronary artery disease. He presented in 2005 after cardiac arrest in [place] within anterior infarct. A stent was placed in the LAD by Dr. Gwenlyn Found. Following year after his Plavix was stopped he had a second stent placed within the first and the LAD while in [place]. In 2014 read the cardiac catheterization with stenting of the circumflex was performed at [place]. Cardiac catheterization in 2016 showed stable disease.   Over the past several weeks the patient noted increasing discomfort in his chest especially when playing golf and walking uphill. He also noted episodes of substernal burning all walking his dogs associated with jaw pain. He denies any rest angina denies shortness of breath or diaphoresis.   Patient has no history of diabetes he is a previous smoker but quit more than 15 years ago.   Current Activity/ Functional Status:  Patient is independent with mobility/ambulation, transfers, ADL's, IADL's.   Zubrod Score: At the time of surgery this patient's most appropriate activity status/level should be described as: []     0    Normal activity, no symptoms [x]     1    Restricted in physical strenuous activity but ambulatory, able to do out light work []     2    Ambulatory and capable of self care, unable to do work activities, up and about               >50 % of waking hours                              []     3    Only limited self care, in bed greater than 50% of waking hours []     4    Completely disabled, no  self care, confined to bed or chair []     5    Moribund   Past Medical History:  Diagnosis Date  . Anginal pain ([place])    occ  . Arthritis   . CAD (coronary artery disease)    a. Anterior STEMI 2005 c/b vfib arrest, PCI to LAD at [facility] b. Stent thrombosis 2006 with DES within prior LAD stent at [facility]. C. 09/2012: s/p balloon angioplasty to mLAD for severe stenosis in previously stented segment & DES to LCx; initial enz neg but ruled in for NSTEMI after post-cath vagal sx, felt d/t distal emboliz of thrombus during case.  . Elevated hemidiaphragm    a. Noted 09/2012 - instructed to f/u PCP.  Marland Kitchen GERD (gastroesophageal reflux disease)   . Heart murmur   . History of kidney stones   . Hypertension   . Ischemic cardiomyopathy    a. Unclear prior EF but pt was told heart was weakened in past. b. EF normal 09/2012.  Marland Kitchen Myocardial infarction   . Shortness  of breath dyspnea   . 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 VF arrest 2005 in setting of STEMI.  . Wears glasses     Past Surgical History:  Procedure Laterality Date  . BACK SURGERY    . CARDIAC CATHETERIZATION     stents x2  . CARDIAC CATHETERIZATION N/A 10/02/2014   Procedure: Left Heart Cath And Coronary Angiography;  Surgeon: Larey Dresser, MD;  Location: [facility] INVASIVE CV LAB CUPID;  Service: Cardiovascular;  Laterality: N/A;  . CARDIAC CATHETERIZATION N/A 02/17/2016   Procedure: Left Heart Cath and Coronary Angiography;  Surgeon: Larey Dresser, MD;  Location: [facility] CV LAB;  Service: Cardiovascular;  Laterality: N/A;  . CORONARY STENT PLACEMENT    . HAND SURGERY Left    hamick bone rem  . KNEE ARTHROSCOPY Right 10/24/2014   Procedure: ARTHROSCOPY RIGHT KNEE, Partial medial menisectomy and chondroplasty;  Surgeon: Melrose Nakayama, MD;  Location: [facility];  Service: Orthopedics;  Laterality: Right;  . LEFT HEART CATH N/A 10/11/2012   Procedure: LEFT HEART CATH;  Surgeon: Sherren Mocha, MD;  Location: [facility] CATH LAB;  Service:  Cardiovascular;  Laterality: N/A;  . LITHOTRIPSY    . SPLIT NIGHT STUDY  09/21/2015  . TONSILLECTOMY      Family History  Problem Relation Age of Onset  . Heart disease Mother   . Stroke Father   . Healthy Brother     Social History   Social History  . Marital status: Married    Spouse name: N/A  . Number of children: N/A  . Years of education: N/A   Occupational History  . retired    Social History Main Topics  . Smoking status: Former Smoker    Packs/day: 0.50    Years: 10.00    Types: Cigars, Cigarettes    Quit date: 05/30/2002  . Smokeless tobacco: Never Used  . Alcohol use No  . Drug use: No  . Sexual activity: Not Currently   Other Topics Concern  . Not on file   Social History Narrative  . No narrative on file    History  Smoking Status  . Former Smoker  . Packs/day: 0.50  . Years: 10.00  . Types: Cigars, Cigarettes  . Quit date: 05/30/2002  Smokeless Tobacco  . Never Used    History  Alcohol Use No     Allergies  Allergen Reactions  . Altace [Ramipril] Shortness Of Breath and Other (See Comments)    cough  . Penicillins Swelling    Has patient had a PCN reaction causing immediate rash, facial/tongue/throat swelling, SOB or lightheadedness with hypotension:unsure Has patient had a PCN reaction causing severe rash involving mucus membranes or skin necrosis:unsure Has patient had a PCN reaction that required hospitalization:No Has patient had a PCN reaction occurring within the last 10 years:NO If all of the above answers are "NO", then may proceed with Cephalosporin use.  Childhood reaction      Current Facility-Administered Medications  Medication Dose Route Frequency Provider Last Rate Last Dose  . chlorhexidine (HIBICLENS) 4 % liquid 2 application  30 mL Topical UD Grace Isaac, MD      . Derrill Memo ON 03/02/2016] chlorhexidine (PERIDEX) 0.12 % solution 15 mL  15 mL Mouth/Throat Once Grace Isaac, MD      . dexmedetomidine  (PRECEDEX) 400 MCG/100ML (4 mcg/mL) infusion  0.1-0.7 mcg/kg/hr Intravenous To OR Grace Isaac, MD      . DOPamine (INTROPIN) 800 mg in dextrose 5 %  250 mL (3.2 mg/mL) infusion  0-10 mcg/kg/min Intravenous To OR Grace Isaac, MD      . EPINEPHrine (ADRENALIN) 4 mg in dextrose 5 % 250 mL (0.016 mg/mL) infusion  0-10 mcg/min Intravenous To OR Grace Isaac, MD      . heparin 2,500 Units, papaverine 30 mg in electrolyte-148 (PLASMALYTE-148) 500 mL irrigation   Irrigation To OR Grace Isaac, MD      . heparin 30,000 units/NS 1000 mL solution for CELLSAVER   Other To OR Grace Isaac, MD      . insulin regular (NOVOLIN R,HUMULIN R) 250 Units in sodium chloride 0.9 % 250 mL (1 Units/mL) infusion   Intravenous To OR Grace Isaac, MD      . levofloxacin (LEVAQUIN) IVPB 500 mg  500 mg Intravenous To OR Grace Isaac, MD      . magnesium sulfate (IV Push/IM) injection 40 mEq  40 mEq Other To OR Grace Isaac, MD      . metoprolol tartrate (LOPRESSOR) tablet 12.5 mg  12.5 mg Oral Once Grace Isaac, MD      . nitroGLYCERIN 50 mg in dextrose 5 % 250 mL (0.2 mg/mL) infusion  2-200 mcg/min Intravenous To OR Grace Isaac, MD      . phenylephrine (NEO-SYNEPHRINE) 20 mg in dextrose 5 % 250 mL (0.08 mg/mL) infusion  30-200 mcg/min Intravenous To OR Grace Isaac, MD      . potassium chloride injection 80 mEq  80 mEq Other To OR Grace Isaac, MD      . tranexamic acid (CYKLOKAPRON) 2,500 mg in sodium chloride 0.9 % 250 mL (10 mg/mL) infusion  1.5 mg/kg/hr Intravenous To OR Grace Isaac, MD      . tranexamic acid (CYKLOKAPRON) bolus via infusion - over 30 minutes 1,701 mg  15 mg/kg Intravenous To OR Grace Isaac, MD      . tranexamic acid (CYKLOKAPRON) pump prime solution 227 mg  2 mg/kg Intracatheter To OR Grace Isaac, MD      . vancomycin (VANCOCIN) 1,250 mg in sodium chloride 0.9 % 250 mL IVPB  1,250 mg Intravenous To OR Grace Isaac, MD           Review of Systems:     Cardiac Review of Systems: Y or N  Chest Pain [  y  ]  Resting SOB [ n  ] Exertional SOB  [ n ]  Orthopnea [ n ]   Pedal Edema [n   ]    Palpitations [n  ] Syncope  Florencio.Farrier  ]   Presyncope [  n ]  General Review of Systems: [Y] = yes [  ]=no Constitional: recent weight change [ n ];  Wt loss over the last 3 months [   ] anorexia [  ]; fatigue [  ]; nausea [  ]; night sweats [  ]; fever [  ]; or chills [  ];          Dental: poor dentition[ n ]; Last Dentist visit:   Eye : blurred vision [  ]; diplopia [   ]; vision changes [  ];  Amaurosis fugax[  ]; Resp: cough [  ];  wheezing[ n ];  hemoptysis[n  ]; shortness of breath[n  ]; paroxysmal nocturnal dyspnea[  ]; dyspnea on exertion[  ]; or orthopnea[  ];  GI:  gallstones[  ], vomiting[  ];  dysphagia[  ]; melena[  ];  hematochezia [  ]; heartburn[  ];   Hx of  Colonoscopy[  ]; GU: kidney stones [  ]; hematuria[  ];   dysuria [  ];  nocturia[  ];  history of     obstruction [  ]; urinary frequency [  ]             Skin: rash, swelling[  ];, hair loss[  ];  peripheral edema[  ];  or itching[  ]; Musculosketetal: myalgias[  ];  joint swelling[  ];  joint erythema[  ];  joint pain[  ];  back pain[  ];  Heme/Lymph: bruising[  ];  bleeding[  ];  anemia[  ];  Neuro: TIA[  ];  headaches[  ];  stroke[  ];  vertigo[  ];  seizures[  ];   paresthesias[  ];  difficulty walking[  n];  Psych:depression[  ]; anxiety[  ];  Endocrine: diabetes[  ];  thyroid dysfunction[  ];  Immunizations: Flu up to date [ n ]; Pneumococcal up to date Florencio.Farrier  ];  Other:  Physical Exam: BP (!) 149/79   Pulse (!) 54   Temp 98.8 F (37.1 C) (Oral)   Resp 20   Ht 5\' 11"  (1.803 m)   Wt 250 lb (113.4 kg)   SpO2 98%   BMI 34.87 kg/m   PHYSICAL EXAMINATION: General appearance: alert, cooperative, appears stated age and no distress Head: Normocephalic, without obvious abnormality, atraumatic Neck: no adenopathy, no carotid bruit, no JVD, supple,  symmetrical, trachea midline and thyroid not enlarged, symmetric, no tenderness/mass/nodules Lymph nodes: Cervical, supraclavicular, and axillary nodes normal. Resp: clear to auscultation bilaterally Back: symmetric, no curvature. ROM normal. No CVA tenderness. Cardio: regular rate and rhythm, S1, S2 normal, no murmur, click, rub or gallop GI: soft, non-tender; bowel sounds normal; no masses,  no organomegaly Extremities: extremities normal, atraumatic, no cyanosis or edema and varicose veins noted Neurologic: Grossly normal Patient has no carotid bruits, full DP and PT pulses bilaterally, varicose veins are noted in the left leg Patient is right-handed, was cath in the right radial artery, by Allen's test right ulnar artery is sole provider of the right hand, digit blood flow is maintained in the left hand with either radial or ulnar compression.     Diagnostic Studies & Laboratory data:     Recent Radiology Findings:  Dg Chest 2 View  Result Date: 02/29/2016 CLINICAL DATA:  Coronary artery disease, preoperative evaluation for upcoming bypass surgery EXAM: CHEST  2 VIEW COMPARISON:  10/23/2013 FINDINGS: Cardiac shadow is within normal limits. Mild elevation of the right hemidiaphragm is noted. No focal infiltrate or sizable effusion is seen. No bony abnormality is noted. IMPRESSION: No active cardiopulmonary disease. Electronically Signed   By: Inez Catalina M.D.   On: 02/29/2016 15:46      Recent Lab Findings: Lab Results  Component Value Date   WBC 9.9 02/29/2016   HGB 14.8 02/29/2016   HCT 43.6 02/29/2016   PLT 142 (L) 02/29/2016   GLUCOSE 119 (H) 02/29/2016   CHOL 136 07/16/2015   TRIG 118 07/16/2015   HDL 40 07/16/2015   LDLCALC 72 07/16/2015   ALT 57 02/29/2016   AST 53 (H) 02/29/2016   NA 140 02/29/2016   K 3.8 02/29/2016   CL 109 02/29/2016   CREATININE 1.03 02/29/2016   BUN 14 02/29/2016   CO2 22 02/29/2016   TSH 2.083 10/10/2012   INR 1.18 02/29/2016   HGBA1C 6.2  (H) 02/29/2016  CATH: Larey Dresser, MD (Primary)    Procedures   Left Heart Cath and Coronary Angiography  Conclusion   Complex lesion involving the ostial circumflex, ostial ramus, and distal left main.  50% distal left main stenosis, 90-95% ostial LCx and ostial ramus stenosis.    I reviewed the films with Dr Martinique.  Not ideal anatomy for intervention.  I think that he would be best served by CABG.  I am going to refer him to cardiac surgery, 1st available appt.  As angina has been only with exertion, I am going to let him go home. He will be given a prescription for metoprolol 12.5 mg bid.  He will need an echo.  He also will need to stop Plavix once CABG is scheduled.   Procedural Details/Technique   Technical Details Procedure: Selective Coronary Angiography  Indication: Exertional angina, progressive over the last 2-3 months.   Procedural Details: The right wrist was prepped, draped, and anesthetized with 1% lidocaine. Using the modified Seldinger technique, a 6 French Slender sheath was introduced into the right radial artery. 3 mg of verapamil was administered through the sheath, weight-based unfractionated heparin was administered intravenously. Standard Judkins catheters were used for selective coronary angiography. Catheter exchanges were performed over an exchange length guidewire. There were no immediate procedural complications. A TR band was used for radial hemostasis at the completion of the procedure. The patient was transferred to the post catheterization recovery area for further monitoring.   Loralie Champagne MD, [facility] 02/17/2016, 3:48 PM   Estimated blood loss <50 mL. . During this procedure the patient was administered the following to achieve and maintain moderate conscious sedation: Versed 1 mg, Fentanyl 25 mcg, while the patient's heart rate, blood pressure, and oxygen saturation were continuously monitored. The period of conscious sedation was 39 minutes, of which  I was present face-to-face 100% of this time.    Coronary Findings   Dominance: Left  Left Main  50% distal left main stenosis seen best in RAO cranial view.  Left Anterior Descending  Heavily calcified proximal LAD. 40% proximal LAD stenosis proximal to the previously-placed LAD stent. There is about 30% in-stent restenosis in the LAD stent.  Ramus Intermedius  Moderate vessel, 90% ostial stenosis.  Left Circumflex  95% ostial LCx stenosis. This lesion also involves the ostial ramus. Patent mid-LCx stenosis.  Right Coronary Artery  Luminal irregularities.  Wall Motion       I have independently reviewed the above  cath films and reviewed the findings with the  patient .      Echo: done yesterday   Doppler nl palmar arch on left   Assessment / Plan:   Known long history of coronary artery disease now with progressive disease in the proximal circumflex and left main artery into some degree in the proximal LAD. With the patient's new onset of symptoms and current anatomy I agree with the recommendation to proceed with coronary artery bypass grafting. We've discussed the case with Dr. Aundra Dubin. Patient has held his  Plavix . and we will proceed with planned coronary artery bypass grafting . Risks and options of surgery have been discussed with the patient and his wife in detail. We also specifically discussed possible use of the left radial artery.    The goals risks and alternatives of the planned surgical procedure CABG poss left radial harvest   have been discussed with the patient in detail. The risks of the procedure including death, infection, stroke, myocardial infarction, bleeding,  blood transfusion, neuro vascular compromise of hand with use of radial artery  have all been discussed specifically.  I have quoted [name] a 3 % of perioperative mortality and a complication rate as high as 30%. The patient's questions have been answered.Errik Buchanon is willing  to proceed with the  planned procedure.   Grace Isaac MD      [address] [place] [address] Office [phone]   Beeper [phone]  03/01/2016 7:08 AM

## 2016-03-01 NOTE — Progress Notes (Signed)
  Echocardiogram Echocardiogram Transesophageal has been performed.  Bobbye Charleston 03/01/2016, 11:21 AM

## 2016-03-01 NOTE — Anesthesia Procedure Notes (Signed)
Central Venous Catheter Insertion Performed by: anesthesiologist Patient location: Pre-op. Preanesthetic checklist: patient identified, IV checked, site marked, risks and benefits discussed, surgical consent, monitors and equipment checked, pre-op evaluation, timeout performed and anesthesia consent Position: Trendelenburg Lidocaine 1% used for infiltration Landmarks identified and Seldinger technique used Catheter size: 9 Fr Central line was placed.MAC introducer Swan type and PA catheter depth:thermodilution and 47PA Cath depth:47 Procedure performed using ultrasound guided technique. Attempts: 1 Following insertion, line sutured and dressing applied. Post procedure assessment: blood return through all ports, free fluid flow and no air. Patient tolerated the procedure well with no immediate complications.

## 2016-03-02 ENCOUNTER — Inpatient Hospital Stay (HOSPITAL_COMMUNITY): Payer: Commercial Managed Care - HMO

## 2016-03-02 ENCOUNTER — Encounter (HOSPITAL_COMMUNITY): Payer: Self-pay | Admitting: Cardiothoracic Surgery

## 2016-03-02 LAB — CBC
HCT: 35.6 % — ABNORMAL LOW (ref 39.0–52.0)
HEMATOCRIT: 35.5 % — AB (ref 39.0–52.0)
Hemoglobin: 11.9 g/dL — ABNORMAL LOW (ref 13.0–17.0)
Hemoglobin: 12.1 g/dL — ABNORMAL LOW (ref 13.0–17.0)
MCH: 32.6 pg (ref 26.0–34.0)
MCH: 33.1 pg (ref 26.0–34.0)
MCHC: 33.5 g/dL (ref 30.0–36.0)
MCHC: 34 g/dL (ref 30.0–36.0)
MCV: 97.3 fL (ref 78.0–100.0)
MCV: 97.3 fL (ref 78.0–100.0)
PLATELETS: 146 10*3/uL — AB (ref 150–400)
Platelets: 118 10*3/uL — ABNORMAL LOW (ref 150–400)
RBC: 3.65 MIL/uL — ABNORMAL LOW (ref 4.22–5.81)
RBC: 3.66 MIL/uL — ABNORMAL LOW (ref 4.22–5.81)
RDW: 13 % (ref 11.5–15.5)
RDW: 13.3 % (ref 11.5–15.5)
WBC: 18 10*3/uL — AB (ref 4.0–10.5)
WBC: 19.4 10*3/uL — ABNORMAL HIGH (ref 4.0–10.5)

## 2016-03-02 LAB — BASIC METABOLIC PANEL
ANION GAP: 7 (ref 5–15)
BUN: 15 mg/dL (ref 6–20)
CALCIUM: 8 mg/dL — AB (ref 8.9–10.3)
CO2: 23 mmol/L (ref 22–32)
Chloride: 107 mmol/L (ref 101–111)
Creatinine, Ser: 0.98 mg/dL (ref 0.61–1.24)
Glucose, Bld: 118 mg/dL — ABNORMAL HIGH (ref 65–99)
Potassium: 3.8 mmol/L (ref 3.5–5.1)
Sodium: 137 mmol/L (ref 135–145)

## 2016-03-02 LAB — GLUCOSE, CAPILLARY
Glucose-Capillary: 109 mg/dL — ABNORMAL HIGH (ref 65–99)
Glucose-Capillary: 118 mg/dL — ABNORMAL HIGH (ref 65–99)
Glucose-Capillary: 119 mg/dL — ABNORMAL HIGH (ref 65–99)
Glucose-Capillary: 120 mg/dL — ABNORMAL HIGH (ref 65–99)
Glucose-Capillary: 124 mg/dL — ABNORMAL HIGH (ref 65–99)
Glucose-Capillary: 126 mg/dL — ABNORMAL HIGH (ref 65–99)
Glucose-Capillary: 126 mg/dL — ABNORMAL HIGH (ref 65–99)
Glucose-Capillary: 127 mg/dL — ABNORMAL HIGH (ref 65–99)
Glucose-Capillary: 133 mg/dL — ABNORMAL HIGH (ref 65–99)
Glucose-Capillary: 136 mg/dL — ABNORMAL HIGH (ref 65–99)
Glucose-Capillary: 138 mg/dL — ABNORMAL HIGH (ref 65–99)
Glucose-Capillary: 139 mg/dL — ABNORMAL HIGH (ref 65–99)
Glucose-Capillary: 140 mg/dL — ABNORMAL HIGH (ref 65–99)
Glucose-Capillary: 147 mg/dL — ABNORMAL HIGH (ref 65–99)
Glucose-Capillary: 147 mg/dL — ABNORMAL HIGH (ref 65–99)
Glucose-Capillary: 149 mg/dL — ABNORMAL HIGH (ref 65–99)
Glucose-Capillary: 95 mg/dL (ref 65–99)

## 2016-03-02 LAB — CREATININE, SERUM
Creatinine, Ser: 1.05 mg/dL (ref 0.61–1.24)
GFR calc Af Amer: >60 mL/min (ref >60)
GFR calc non Af Amer: >60 mL/min (ref >60)

## 2016-03-02 LAB — POCT I-STAT, CHEM 8
BUN: 18 mg/dL (ref 6–20)
Calcium, Ion: 1.15 mmol/L (ref 1.15–1.40)
Chloride: 102 mmol/L (ref 101–111)
Creatinine, Ser: 1 mg/dL (ref 0.61–1.24)
Glucose, Bld: 141 mg/dL — ABNORMAL HIGH (ref 65–99)
HCT: 36 % — ABNORMAL LOW (ref 39.0–52.0)
Hemoglobin: 12.2 g/dL — ABNORMAL LOW (ref 13.0–17.0)
Potassium: 4.4 mmol/L (ref 3.5–5.1)
Sodium: 138 mmol/L (ref 135–145)
TCO2: 24 mmol/L (ref 0–100)

## 2016-03-02 LAB — MAGNESIUM
MAGNESIUM: 2.4 mg/dL (ref 1.7–2.4)
Magnesium: 2.4 mg/dL (ref 1.7–2.4)

## 2016-03-02 MED ORDER — ENOXAPARIN SODIUM 40 MG/0.4ML ~~LOC~~ SOLN
40.0000 mg | Freq: Every day | SUBCUTANEOUS | Status: DC
  Administered 2016-03-02 – 2016-03-06 (×5): 40 mg via SUBCUTANEOUS
  Filled 2016-03-02 (×5): qty 0.4

## 2016-03-02 MED ORDER — ISOSORBIDE MONONITRATE ER 30 MG PO TB24
15.0000 mg | ORAL_TABLET | Freq: Every day | ORAL | Status: DC
Start: 2016-03-02 — End: 2016-03-07
  Administered 2016-03-02 – 2016-03-07 (×6): 15 mg via ORAL
  Filled 2016-03-02 (×6): qty 1

## 2016-03-02 MED ORDER — INSULIN DETEMIR 100 UNIT/ML ~~LOC~~ SOLN
20.0000 [IU] | Freq: Every day | SUBCUTANEOUS | Status: DC
  Administered 2016-03-03: 20 [IU] via SUBCUTANEOUS
  Filled 2016-03-02 (×2): qty 0.2

## 2016-03-02 MED ORDER — INSULIN ASPART 100 UNIT/ML ~~LOC~~ SOLN
0.0000 [IU] | SUBCUTANEOUS | Status: DC
  Administered 2016-03-02: 4 [IU] via SUBCUTANEOUS
  Administered 2016-03-02 – 2016-03-04 (×9): 2 [IU] via SUBCUTANEOUS

## 2016-03-02 MED ORDER — INSULIN DETEMIR 100 UNIT/ML ~~LOC~~ SOLN
20.0000 [IU] | Freq: Once | SUBCUTANEOUS | Status: AC
  Administered 2016-03-02: 20 [IU] via SUBCUTANEOUS
  Filled 2016-03-02: qty 0.2

## 2016-03-02 MED ORDER — DIPHENHYDRAMINE HCL 25 MG PO CAPS
25.0000 mg | ORAL_CAPSULE | Freq: Every evening | ORAL | Status: DC | PRN
Start: 2016-03-02 — End: 2016-03-04

## 2016-03-02 MED FILL — Heparin Sodium (Porcine) Inj 1000 Unit/ML: INTRAMUSCULAR | Qty: 10 | Status: AC

## 2016-03-02 MED FILL — Mannitol IV Soln 20%: INTRAVENOUS | Qty: 500 | Status: AC

## 2016-03-02 MED FILL — Lidocaine HCl IV Inj 20 MG/ML: INTRAVENOUS | Qty: 5 | Status: AC

## 2016-03-02 MED FILL — Sodium Bicarbonate IV Soln 8.4%: INTRAVENOUS | Qty: 50 | Status: AC

## 2016-03-02 MED FILL — Electrolyte-R (PH 7.4) Solution: INTRAVENOUS | Qty: 4000 | Status: AC

## 2016-03-02 MED FILL — Sodium Chloride IV Soln 0.9%: INTRAVENOUS | Qty: 2000 | Status: AC

## 2016-03-02 NOTE — Progress Notes (Signed)
Patient ID: Hayden Rivera, male   DOB: 14-Dec-1950, 65 y.o.   MRN: [id number] SICU Evening Rounds:   Hemodynamically stable  Sinus rhythm   Urine output good    CBC    Component Value Date/Time   WBC 19.4 (H) 03/02/2016 1655   RBC 3.66 (L) 03/02/2016 1655   HGB 12.2 (L) 03/02/2016 1657   HCT 36.0 (L) 03/02/2016 1657   PLT 118 (L) 03/02/2016 1655   MCV 97.3 03/02/2016 1655   MCH 33.1 03/02/2016 1655   MCHC 34.0 03/02/2016 1655   RDW 13.3 03/02/2016 1655   LYMPHSABS 3,744 02/12/2016 0930   MONOABS 768 02/12/2016 0930   EOSABS 288 02/12/2016 0930   BASOSABS 96 02/12/2016 0930     BMET    Component Value Date/Time   NA 138 03/02/2016 1657   NA 143 10/23/2013 1106   K 4.4 03/02/2016 1657   CL 102 03/02/2016 1657   CO2 23 03/02/2016 0440   GLUCOSE 141 (H) 03/02/2016 1657   BUN 18 03/02/2016 1657   BUN 15 10/23/2013 1106   CREATININE 1.00 03/02/2016 1657   CREATININE 1.00 02/12/2016 0930   CALCIUM 8.0 (L) 03/02/2016 0440   GFRNONAA >60 03/02/2016 1655   GFRAA >60 03/02/2016 1655     A/P:  Stable postop course. Continue current plans

## 2016-03-02 NOTE — Progress Notes (Signed)
Patient ID: Hayden Rivera, male   DOB: 08/17/1950, 65 y.o.   MRN: [id number] TCTS DAILY ICU PROGRESS NOTE                   [address]            [place] [address]          [phone]   1 Day Post-Op Procedure(s) (LRB): CORONARY ARTERY BYPASS GRAFTING (CABG)x3 with endoscopic harvesting of right saphenous vein -LIMA to LAD -SVG to LEFT CIRCUMFLEX -RADIAL ARTERY to RAMUS INTERMEDIA (N/A) TRANSESOPHAGEAL ECHOCARDIOGRAM (TEE) (N/A) LEFT RADIAL ARTERY HARVEST (Left)  Total Length of Stay:  LOS: 1 day   Subjective: Awake and alert neuro intact  Objective: Vital signs in last 24 hours: Temp:  [97.7 F (36.5 C)-99.9 F (37.7 C)] 99 F (37.2 C) (10/04 0700) Pulse Rate:  [79-82] 79 (10/04 0700) Cardiac Rhythm: Atrial paced (10/04 0000) Resp:  [0-21] 17 (10/04 0700) BP: (89-130)/(59-81) 130/66 (10/04 0700) SpO2:  [93 %-100 %] 94 % (10/04 0700) Arterial Line BP: (88-128)/(27-72) 128/54 (10/04 0700) FiO2 (%):  [70 %-100 %] 70 % (10/03 1914) Weight:  [261 lb 11 oz (118.7 kg)] 261 lb 11 oz (118.7 kg) (10/04 0556)  Filed Weights   03/01/16 0612 03/02/16 0556  Weight: 250 lb (113.4 kg) 261 lb 11 oz (118.7 kg)    Weight change: 11 lb 11 oz (5.301 kg)   Hemodynamic parameters for last 24 hours: PAP: (22-41)/(10-21) 28/14 CO:  [5.8 L/min-10.6 L/min] 10.6 L/min CI:  [2.5 L/min/m2-4.6 L/min/m2] 4.6 L/min/m2  Intake/Output from previous day: 10/03 0701 - 10/04 0700 In: 6179.5 [I.V.:4059.5; Blood:940; NG/GT:30; IV [id number]:7902] Out: 4097 [Urine:1640; Blood:1625; Chest Tube:360]  Intake/Output this shift: No intake/output data recorded.  Current Meds: Scheduled Meds: . acetaminophen  1,000 mg Oral Q6H   Or  . acetaminophen (TYLENOL) oral liquid 160 mg/5 mL  1,000 mg Per Tube Q6H  . aspirin EC  325 mg Oral Daily   Or  . aspirin  324 mg Per Tube Daily  . bisacodyl  10 mg Oral Daily   Or  . bisacodyl  10 mg Rectal Daily  . chlorhexidine gluconate (MEDLINE  KIT)  15 mL Mouth Rinse BID  . docusate sodium  200 mg Oral Daily  . insulin regular  0-10 Units Intravenous TID WC  . levofloxacin (LEVAQUIN) IV  750 mg Intravenous Q24H  . mouth rinse  15 mL Mouth Rinse QID  . metoCLOPramide (REGLAN) injection  10 mg Intravenous Q6H  . metoprolol tartrate  12.5 mg Oral BID   Or  . metoprolol tartrate  12.5 mg Per Tube BID  . [START ON 03/03/2016] pantoprazole  40 mg Oral Daily  . rosuvastatin  10 mg Oral QPM  . sodium chloride flush  3 mL Intravenous Q12H   Continuous Infusions: . sodium chloride 20 mL/hr at 03/02/16 0600  . sodium chloride 250 mL (03/02/16 0600)  . sodium chloride Stopped (03/01/16 1900)  . dexmedetomidine Stopped (03/01/16 2100)  . insulin (NOVOLIN-R) infusion 7.8 Units/hr (03/02/16 0630)  . lactated ringers    . lactated ringers 20 mL/hr at 03/02/16 0600  . milrinone 0.375 mcg/kg/min (03/02/16 0600)  . nitroGLYCERIN 5 mcg/min (03/02/16 0600)  . phenylephrine (NEO-SYNEPHRINE) Adult infusion 30 mcg/min (03/02/16 0600)   PRN Meds:.sodium chloride, albumin human, lactated ringers, metoprolol, midazolam, morphine injection, ondansetron (ZOFRAN) IV, oxyCODONE, sodium chloride flush, traMADol  General appearance: alert and cooperative Neurologic: intact Heart: regular rate and rhythm, S1,  S2 normal, no murmur, click, rub or gallop Lungs: diminished breath sounds bibasilar Abdomen: soft, non-tender; bowel sounds normal; no masses,  no organomegaly Extremities: extremities normal, atraumatic, no cyanosis or edema and Homans sign is negative, no sign of DVT Wound: sternum stable, left arm hand neurovascular intact  Lab Results: CBC: Recent Labs  03/01/16 1700 03/01/16 2134 03/02/16 0440  WBC 16.5*  --  18.0*  HGB 12.3* 12.2* 11.9*  HCT 37.3* 36.0* 35.5*  PLT 130*  --  146*   BMET:  Recent Labs  02/29/16 1344  03/01/16 2134 03/02/16 0440  NA 140  < > 136 137  K 3.8  < > 3.8 3.8  CL 109  < > 105 107  CO2 22  --   --   23  GLUCOSE 119*  < > 162* 118*  BUN 14  < > 15 15  CREATININE 1.03  < > 0.80 0.98  CALCIUM 9.6  --   --  8.0*  < > = values in this interval not displayed.  CMET: Lab Results  Component Value Date   WBC 18.0 (H) 03/02/2016   HGB 11.9 (L) 03/02/2016   HCT 35.5 (L) 03/02/2016   PLT 146 (L) 03/02/2016   GLUCOSE 118 (H) 03/02/2016   CHOL 136 07/16/2015   TRIG 118 07/16/2015   HDL 40 07/16/2015   LDLCALC 72 07/16/2015   ALT 57 02/29/2016   AST 53 (H) 02/29/2016   NA 137 03/02/2016   K 3.8 03/02/2016   CL 107 03/02/2016   CREATININE 0.98 03/02/2016   BUN 15 03/02/2016   CO2 23 03/02/2016   TSH 2.083 10/10/2012   PSA 0.2 10/23/2013   INR 1.67 03/01/2016   HGBA1C 6.2 (H) 02/29/2016    PT/INR:  Recent Labs  03/01/16 1530  LABPROT 19.9*  INR 1.67   Radiology: Dg Chest Port 1 View  Result Date: 03/02/2016 CLINICAL DATA:  65 year old male with chest tube status post CABG. Initial encounter. EXAM: PORTABLE CHEST 1 VIEW COMPARISON:  03/01/2016 and earlier. FINDINGS: Portable AP semi upright view at 0557 hours. Extubated. Enteric tube removed. Right IJ approach Swan-Ganz catheter remains in place, tip at the level of the upper right ventricle or pulmonary outflow tract. Mediastinal tube and left chest tube remain in place. Stable low lung volumes. No pneumothorax identified. Stable pulmonary vascularity no large pleural effusion. Mildly improved left lung base ventilation with residual streaky in confluent retrocardiac opacity. Stable cardiac size and mediastinal contours. IMPRESSION: 1. Extubated and enteric tube removed.  Stable low lung volumes. 2. Swan-Ganz catheter tip at the upper right ventricle or pulmonary outflow tract. No pulmonary edema. 3. Stable left chest tube.  No pneumothorax. 4. Mildly decreased lung base atelectasis. Electronically Signed   By: Genevie Ann M.D.   On: 03/02/2016 07:27   Dg Chest Port 1 View  Result Date: 03/01/2016 CLINICAL DATA:  Status post CABG  procedure. EXAM: PORTABLE CHEST 1 VIEW COMPARISON:  02/29/2016 FINDINGS: Endotracheal tube is 2.8 cm above the carina. Pulmonary artery catheter is in the distal main pulmonary artery region. Nasogastric tube extends into the upper abdomen. There is a mediastinal and left chest tube. Left basilar chest densities are suggestive for volume loss. Upper lungs are clear. Median sternotomy wires are present. Heart size is stable. Negative for a pneumothorax. IMPRESSION: Left basilar densities are suggestive for consolidation or volume loss. Support apparatuses without pneumothorax. Electronically Signed   By: Markus Daft M.D.   On: 03/01/2016 16:49  Assessment/Plan: S/P Procedure(s) (LRB): CORONARY ARTERY BYPASS GRAFTING (CABG)x3 with endoscopic harvesting of right saphenous vein -LIMA to LAD -SVG to LEFT CIRCUMFLEX -RADIAL ARTERY to RAMUS INTERMEDIA (N/A) TRANSESOPHAGEAL ECHOCARDIOGRAM (TEE) (N/A) LEFT RADIAL ARTERY HARVEST (Left) Mobilize Diuresis Diabetes control d/c tubes/lines Continue foley due to strict I&O and patient in ICU See progression orders Expected Acute  Blood - loss Anemia    Grace Isaac 03/02/2016 8:54 AM

## 2016-03-03 ENCOUNTER — Inpatient Hospital Stay (HOSPITAL_COMMUNITY): Payer: Commercial Managed Care - HMO

## 2016-03-03 ENCOUNTER — Other Ambulatory Visit: Payer: Self-pay

## 2016-03-03 LAB — BASIC METABOLIC PANEL
Anion gap: 6 (ref 5–15)
BUN: 21 mg/dL — ABNORMAL HIGH (ref 6–20)
CO2: 25 mmol/L (ref 22–32)
Calcium: 8.4 mg/dL — ABNORMAL LOW (ref 8.9–10.3)
Chloride: 105 mmol/L (ref 101–111)
Creatinine, Ser: 1.12 mg/dL (ref 0.61–1.24)
GFR calc Af Amer: >60 mL/min (ref >60)
GFR calc non Af Amer: >60 mL/min (ref >60)
Glucose, Bld: 140 mg/dL — ABNORMAL HIGH (ref 65–99)
Potassium: 4.8 mmol/L (ref 3.5–5.1)
Sodium: 136 mmol/L (ref 135–145)

## 2016-03-03 LAB — CBC
HCT: 36.6 % — ABNORMAL LOW (ref 39.0–52.0)
Hemoglobin: 11.9 g/dL — ABNORMAL LOW (ref 13.0–17.0)
MCH: 32.3 pg (ref 26.0–34.0)
MCHC: 32.5 g/dL (ref 30.0–36.0)
MCV: 99.5 fL (ref 78.0–100.0)
Platelets: 127 10*3/uL — ABNORMAL LOW (ref 150–400)
RBC: 3.68 MIL/uL — ABNORMAL LOW (ref 4.22–5.81)
RDW: 13.6 % (ref 11.5–15.5)
WBC: 21.8 10*3/uL — ABNORMAL HIGH (ref 4.0–10.5)

## 2016-03-03 LAB — GLUCOSE, CAPILLARY
Glucose-Capillary: 138 mg/dL — ABNORMAL HIGH (ref 65–99)
Glucose-Capillary: 146 mg/dL — ABNORMAL HIGH (ref 65–99)
Glucose-Capillary: 157 mg/dL — ABNORMAL HIGH (ref 65–99)
Glucose-Capillary: 154 mg/dL — ABNORMAL HIGH (ref 65–99)
Glucose-Capillary: 150 mg/dL — ABNORMAL HIGH (ref 65–99)

## 2016-03-03 LAB — HEPATIC FUNCTION PANEL
ALT: 35 U/L (ref 17–63)
AST: 38 U/L (ref 15–41)
Albumin: 3.4 g/dL — ABNORMAL LOW (ref 3.5–5.0)
Alkaline Phosphatase: 46 U/L (ref 38–126)
Bilirubin, Direct: 0.3 mg/dL (ref 0.1–0.5)
Indirect Bilirubin: 1.2 mg/dL — ABNORMAL HIGH (ref 0.3–0.9)
Total Bilirubin: 1.5 mg/dL — ABNORMAL HIGH (ref 0.3–1.2)
Total Protein: 6.4 g/dL — ABNORMAL LOW (ref 6.5–8.1)

## 2016-03-03 LAB — TSH: TSH: 2.208 u[IU]/mL (ref 0.350–4.500)

## 2016-03-03 MED ORDER — AMIODARONE HCL IN DEXTROSE 360-4.14 MG/200ML-% IV SOLN
30.0000 mg/h | INTRAVENOUS | Status: DC
  Administered 2016-03-04: 30 mg/h via INTRAVENOUS
  Filled 2016-03-03 (×2): qty 200

## 2016-03-03 MED ORDER — AMIODARONE HCL IN DEXTROSE 360-4.14 MG/200ML-% IV SOLN
60.0000 mg/h | INTRAVENOUS | Status: AC
  Administered 2016-03-03: 60 mg/h via INTRAVENOUS
  Filled 2016-03-03: qty 200

## 2016-03-03 MED ORDER — FUROSEMIDE 10 MG/ML IJ SOLN
40.0000 mg | Freq: Once | INTRAMUSCULAR | Status: AC
  Administered 2016-03-03: 40 mg via INTRAVENOUS
  Filled 2016-03-03: qty 4

## 2016-03-03 MED ORDER — AMIODARONE HCL IN DEXTROSE 360-4.14 MG/200ML-% IV SOLN
INTRAVENOUS | Status: AC
  Administered 2016-03-03: 150 mg via INTRAVENOUS
  Filled 2016-03-03: qty 200

## 2016-03-03 MED ORDER — AMIODARONE HCL 200 MG PO TABS: 400.0000 mg | ORAL_TABLET | Freq: Every day | ORAL | Status: DC

## 2016-03-03 MED ORDER — AMIODARONE LOAD VIA INFUSION
150.0000 mg | Freq: Once | INTRAVENOUS | Status: AC
  Administered 2016-03-03: 150 mg via INTRAVENOUS
  Filled 2016-03-03: qty 83.34

## 2016-03-03 MED ORDER — METOPROLOL TARTRATE 25 MG/10 ML ORAL SUSPENSION: 25.0000 mg | Freq: Two times a day (BID) | ORAL | Status: DC

## 2016-03-03 MED ORDER — AMIODARONE HCL 200 MG PO TABS
400.0000 mg | ORAL_TABLET | Freq: Two times a day (BID) | ORAL | Status: DC
  Administered 2016-03-03 – 2016-03-05 (×4): 400 mg via ORAL
  Filled 2016-03-03 (×5): qty 2

## 2016-03-03 MED ORDER — METOPROLOL TARTRATE 12.5 MG HALF TABLET: 12.5000 mg | ORAL_TABLET | Freq: Two times a day (BID) | ORAL | Status: DC

## 2016-03-03 MED ORDER — METOPROLOL TARTRATE 25 MG PO TABS
25.0000 mg | ORAL_TABLET | Freq: Two times a day (BID) | ORAL | Status: DC
  Administered 2016-03-03 (×3): 25 mg via ORAL
  Filled 2016-03-03 (×2): qty 1

## 2016-03-03 MED ORDER — AMIODARONE IV BOLUS ONLY 150 MG/100ML
150.0000 mg | Freq: Once | INTRAVENOUS | Status: AC
  Administered 2016-03-03: 150 mg via INTRAVENOUS

## 2016-03-03 NOTE — Progress Notes (Signed)
      [address]       [place] [address]             [phone]      POD # 2 CABG  In atrial fibrillation with rate 115-135, tolerating well  BP (!) 121/106   Pulse (!) 119   Temp 97.8 F (36.6 C) (Oral)   Resp (!) 21   Ht 5\' 11"  (1.803 m)   Wt 258 lb 2.5 oz (117.1 kg)   SpO2 95%   BMI 36.01 kg/m    Intake/Output Summary (Last 24 hours) at 03/03/16 1930 Last data filed at 03/03/16 1700  Gross per 24 hour  Intake           543.66 ml  Output             1770 ml  Net         -1226.34 ml   Atrial fib- has received 2 additional boluses of amiodarone today  Remo Lipps C. Roxan Hockey, MD [facility] [phone]

## 2016-03-03 NOTE — Plan of Care (Signed)
Problem: Cardiac: Goal: Hemodynamic stability will improve Outcome: Progressing Pt off all IV vasopressors Goal: Ability to maintain an adequate cardiac output will improve Outcome: Completed/Met Date Met: 03/03/16 All CI > 3, swan d/ced 10/4 Goal: Will show no signs and symptoms of excessive bleeding Outcome: Progressing Minimal CTD. MT d/ced per order

## 2016-03-03 NOTE — Op Note (Signed)
NAME:  Hayden Rivera, KAUK NO.:  [id number]  MEDICAL RECORD NO.:  [id number]  LOCATION:                                 FACILITY:  PHYSICIAN:  Lanelle Bal, MD    DATE OF BIRTH:  1950/11/25  DATE OF PROCEDURE:  03/01/2016 DATE OF DISCHARGE:                              OPERATIVE REPORT   PREOPERATIVE DIAGNOSIS:  Coronary occlusive disease with angina.  POSTOPERATIVE DIAGNOSIS:  Coronary occlusive disease with angina.  SURGICAL PROCEDURE:  Coronary artery bypass grafting x3 with the left internal mammary to the left anterior descending coronary artery, left radial bypass to the intermediate coronary artery, and reverse saphenous vein graft to the circumflex coronary artery with right thigh greater saphenous vein harvesting endoscopically.  SURGEON:  Lanelle Bal, MD.  FIRST ASSISTANT:  Providence Crosby, PA.  BRIEF HISTORY:  The patient is a 65 year old male with known previous coronary occlusive disease, who originally presented in 2005 with a cardiac arrest in [place], transferred to [facility].  A stent was placed in the LAD.  After Plavix was discontinued, the patient had rethrombosis of the LAD stent and was restented in [place], [place].  He later presented with recurrent symptoms and a stent was placed in the circumflex coronary artery at [facility].  He now presents with increasing unstable anginal symptoms and was cathed by Dr. Aundra Dubin.  The patient was discharged home and referred to the office.  His Plavix was held for several days and he was admitted for coronary artery bypass grafting with critical disease in the distal left main and complex stenosis at the takeoff of the circumflex and intermediate with proximal LAD disease of 50%.  The right coronary artery was without significant disease.  Coronary artery bypass grafting was recommended.  The patient agreed and signed informed consent.  DESCRIPTION OF PROCEDURE:  With Swan-Ganz and arterial  line monitors in place, the patient underwent general endotracheal anesthesia without incidence.  Skin of the chest and legs was prepped with Betadine and draped in the usual sterile manner.  The left arm was also prepped out. The patient's preoperative Doppler studies showed intact palmar arch on the left.  This was checked again in the operating room and confirmed. Appropriate time-out was performed and then we proceeded with open harvesting of the left radial artery.  A curvilinear skin incision was made along the volar aspect of the left arm starting first at the distal end exposing the radial artery enough that a small bulldog was placed on the radial artery and again palmar arch.  The flow was intact with the Doppler.  We then continued with dissection of the radial artery harvesting with its associated venous structures with the Harmonic Scalpel.  Prior to removal of the radial artery, the patient was given heparin.  The distal radial was divided and ligated.  Heparinized saline with blood was then used to soak the mammary artery and a small clamp was placed proximally, again checking the ulnar artery that no compromise was noted.  The proximal radial was then divided.  A small cannula was placed in the radial artery and the vessel was flushed with blood and heparinized saline.  The proximal stump of the radial artery was ligated.  Incision was then closed with a running 2-0 Vicryl in the subcutaneous tissue and a 3-0 subcuticular stitch in skin edges.  Using the [employer] End[employer] vein harvesting system a segment of right greater saphenous vein was harvested.  Endoscopically, it was of adequate quality.  Median sternotomy was performed.  Left internal mammary artery was dissected down as a pedicle graft.  The distal artery was divided, had good free flow.  Pericardium was opened.  Overall, ventricular function appeared preserved.  The patient was systemically heparinized. Ascending  aorta was cannulated.  The right atrium was cannulated.  An aortic root vent cardioplegia needle was introduced into the ascending aorta.  The patient was placed on cardiopulmonary bypass 2.4 L/min/m2. Sites of anastomosis were selected and dissected out of the epicardium. The patient's body temperature was cooled to 32 degrees.  Aortic crossclamp was applied and 500 mL of cold blood potassium cardioplegia was administered antegrade.  Attention was turned first to the circumflex coronary artery, which was opened and admitted a 1.5 mm probe.  Using a running 7-0 Prolene segment reverse saphenous vein graft was anastomosed to the distal circumflex coronary artery.  The radial artery which had been prepared, ends were trimmed and lining the artery carefully to prevent any kinking or twisting.  Vessel previously been marked.  The intermediate coronary artery was opened and measuring 1.5 mm.  Using a running 8-0 Prolene, the radial artery was anastomosed to the intermediate coronary artery.  The left anterior descending coronary artery was then trimmed to appropriate length.  The LAD was opened and the left internal mammary artery was anastomosed to left anterior descending coronary artery with a running 8-0 Prolene with the crossclamp still in place.  A single punch aortotomy was performed and the vein graft to the circumflex was anastomosed to the ascending aorta. Since the patient's aorta was slightly thickened and to make the size match appropriate with the radial, the radial artery was brought off the hood of the vein graft with a running 7-0 Prolene.  Prior to completion of the anastomosis, the heart was allowed to passively fill and de-air, the bulldog was removed from the mammary artery with rise in myocardial septal temperature.  Aortic crossclamp was removed.  Total cross-clamp time of 87 minutes.  The patient spontaneously converted to a sinus rhythm.  Sites of anastomosis were  inspected and cleared free of bleeding.  Atrial and ventricular pacing wires were applied.  The patient was then ventilated and weaned from cardiopulmonary bypass without difficulty.  He remained hemodynamically stable and was decannulated in the usual fashion.  Protamine sulfate was administered. With operative field hemostatic, a left pleural tube and a Blake mediastinal drain were left in place.  Pericardium was loosely reapproximated.  Sternum was closed with #6 stainless steel wire. Fascia was closed with interrupted 0 Vicryl, running 3-0 Vicryl in subcutaneous tissue, 4-0 subcuticular stitch in skin edges.  Dry dressings were applied.  Sponge and needle count was reported as correct at completion of procedure.  The patient tolerated procedure without obvious complication and was transferred to the Surgical Intensive Care Unit for further postoperative care.  Total pump time was 118 minutes. The patient did not require any blood bank blood products during the operative procedure.     Lanelle Bal, MD   ______________________________ Lanelle Bal, MD    EG/MEDQ  D:  03/03/2016  T:  03/03/2016  Job:  [id number]  cc:  Loralie Champagne, MD

## 2016-03-03 NOTE — Progress Notes (Signed)
Patient ID: Hayden Rivera, male   DOB: 01-Nov-1950, 65 y.o.   MRN: [id number] TCTS DAILY ICU PROGRESS NOTE                   [address]            [place] [address]          [phone]   2 Days Post-Op Procedure(s) (LRB): CORONARY ARTERY BYPASS GRAFTING (CABG)x3 with endoscopic harvesting of right saphenous vein -LIMA to LAD -SVG to LEFT CIRCUMFLEX -RADIAL ARTERY to RAMUS INTERMEDIA (N/A) TRANSESOPHAGEAL ECHOCARDIOGRAM (TEE) (N/A) LEFT RADIAL ARTERY HARVEST (Left)  Total Length of Stay:  LOS: 2 days   Subjective: Pac's this am, now in afib, bp stable, hr in 110-115  Objective: Vital signs in last 24 hours: Temp:  [97.7 F (36.5 C)-98.8 F (37.1 C)] 97.9 F (36.6 C) (10/05 0758) Pulse Rate:  [32-130] 108 (10/05 0800) Cardiac Rhythm: Atrial fibrillation (10/05 0800) Resp:  [11-30] 18 (10/05 0800) BP: (104-188)/(70-159) 134/104 (10/05 0800) SpO2:  [86 %-96 %] 92 % (10/05 0800) Arterial Line BP: (139-145)/(60-68) 145/68 (10/04 1100) Weight:  [258 lb 2.5 oz (117.1 kg)] 258 lb 2.5 oz (117.1 kg) (10/05 0610)  Filed Weights   03/01/16 0612 03/02/16 0556 03/03/16 0610  Weight: 250 lb (113.4 kg) 261 lb 11 oz (118.7 kg) 258 lb 2.5 oz (117.1 kg)    Weight change: -3 lb 8.4 oz (-1.6 kg)   Hemodynamic parameters for last 24 hours: PAP: (31-37)/(7-21) 37/21  Intake/Output from previous day: 10/04 0701 - 10/05 0700 In: 583.6 [P.O.:120; I.V.:313.6; IV Piggyback:150] Out: 1280 [Urine:930; Chest Tube:350]  Intake/Output this shift: No intake/output data recorded.  Current Meds: Scheduled Meds: . acetaminophen  1,000 mg Oral Q6H   Or  . acetaminophen (TYLENOL) oral liquid 160 mg/5 mL  1,000 mg Per Tube Q6H  . aspirin EC  325 mg Oral Daily   Or  . aspirin  324 mg Per Tube Daily  . bisacodyl  10 mg Oral Daily   Or  . bisacodyl  10 mg Rectal Daily  . chlorhexidine gluconate (MEDLINE KIT)  15 mL Mouth Rinse BID  . docusate sodium  200 mg Oral Daily  .  enoxaparin (LOVENOX) injection  40 mg Subcutaneous QHS  . insulin aspart  0-24 Units Subcutaneous Q4H  . insulin detemir  20 Units Subcutaneous Daily  . isosorbide mononitrate  15 mg Oral Daily  . mouth rinse  15 mL Mouth Rinse QID  . metoprolol tartrate  25 mg Oral BID   Or  . metoprolol tartrate  25 mg Per Tube BID  . pantoprazole  40 mg Oral Daily  . rosuvastatin  10 mg Oral QPM  . sodium chloride flush  3 mL Intravenous Q12H   Continuous Infusions: . sodium chloride Stopped (03/02/16 1100)  . sodium chloride Stopped (03/02/16 0700)  . sodium chloride Stopped (03/01/16 1900)  . dexmedetomidine Stopped (03/01/16 2100)  . lactated ringers    . lactated ringers 10 mL/hr at 03/03/16 0600  . phenylephrine (NEO-SYNEPHRINE) Adult infusion Stopped (03/02/16 0900)   PRN Meds:.sodium chloride, diphenhydrAMINE, lactated ringers, metoprolol, morphine injection, ondansetron (ZOFRAN) IV, oxyCODONE, sodium chloride flush, traMADol  General appearance: alert and cooperative Neurologic: intact Heart: irregularly irregular rhythm Lungs: diminished breath sounds bibasilar Abdomen: soft, non-tender; bowel sounds normal; no masses,  no organomegaly Extremities: extremities normal, atraumatic, no cyanosis or edema and Homans sign is negative, no sign of DVT Wound: intact  Lab Results: CBC:  Recent Labs  03/02/16 1655 03/02/16 1657 03/03/16 0342  WBC 19.4*  --  21.8*  HGB 12.1* 12.2* 11.9*  HCT 35.6* 36.0* 36.6*  PLT 118*  --  127*   BMET:  Recent Labs  03/02/16 0440  03/02/16 1657 03/03/16 0342  NA 137  --  138 136  K 3.8  --  4.4 4.8  CL 107  --  102 105  CO2 23  --   --  25  GLUCOSE 118*  --  141* 140*  BUN 15  --  18 21*  CREATININE 0.98  < > 1.00 1.12  CALCIUM 8.0*  --   --  8.4*  < > = values in this interval not displayed.  CMET: Lab Results  Component Value Date   WBC 21.8 (H) 03/03/2016   HGB 11.9 (L) 03/03/2016   HCT 36.6 (L) 03/03/2016   PLT 127 (L) 03/03/2016    GLUCOSE 140 (H) 03/03/2016   CHOL 136 07/16/2015   TRIG 118 07/16/2015   HDL 40 07/16/2015   LDLCALC 72 07/16/2015   ALT 57 02/29/2016   AST 53 (H) 02/29/2016   NA 136 03/03/2016   K 4.8 03/03/2016   CL 105 03/03/2016   CREATININE 1.12 03/03/2016   BUN 21 (H) 03/03/2016   CO2 25 03/03/2016   TSH 2.083 10/10/2012   PSA 0.2 10/23/2013   INR 1.67 03/01/2016   HGBA1C 6.2 (H) 02/29/2016    PT/INR:  Recent Labs  03/01/16 1530  LABPROT 19.9*  INR 1.67   Radiology: Dg Chest Port 1 View  Result Date: 03/03/2016 CLINICAL DATA:  Chest pain EXAM: PORTABLE CHEST 1 VIEW COMPARISON:  03/02/2016 FINDINGS: Left thoracostomy catheter remains in place. Right jugular sheath is noted. The Swan-Ganz catheter has been removed in the interval. The overall inspiratory effort is poor with minimal basilar atelectasis and small left pleural effusion. No pneumothorax is seen. IMPRESSION: Poor inspiratory effort with mild basilar atelectasis and left pleural effusion. No new focal abnormality is seen. Electronically Signed   By: Inez Catalina M.D.   On: 03/03/2016 07:52     Assessment/Plan: S/P Procedure(s) (LRB): CORONARY ARTERY BYPASS GRAFTING (CABG)x3 with endoscopic harvesting of right saphenous vein -LIMA to LAD -SVG to LEFT CIRCUMFLEX -RADIAL ARTERY to RAMUS INTERMEDIA (N/A) TRANSESOPHAGEAL ECHOCARDIOGRAM (TEE) (N/A) LEFT RADIAL ARTERY HARVEST (Left) Mobilize Diuresis Diabetes control Now afib, start  Cordarone now  Grace Isaac 03/03/2016 8:26 AM

## 2016-03-04 ENCOUNTER — Inpatient Hospital Stay (HOSPITAL_COMMUNITY): Payer: Commercial Managed Care - HMO

## 2016-03-04 LAB — GLUCOSE, CAPILLARY
Glucose-Capillary: 113 mg/dL — ABNORMAL HIGH (ref 65–99)
Glucose-Capillary: 123 mg/dL — ABNORMAL HIGH (ref 65–99)
Glucose-Capillary: 132 mg/dL — ABNORMAL HIGH (ref 65–99)
Glucose-Capillary: 138 mg/dL — ABNORMAL HIGH (ref 65–99)
Glucose-Capillary: 145 mg/dL — ABNORMAL HIGH (ref 65–99)
Glucose-Capillary: 155 mg/dL — ABNORMAL HIGH (ref 65–99)
Glucose-Capillary: 162 mg/dL — ABNORMAL HIGH (ref 65–99)

## 2016-03-04 LAB — BASIC METABOLIC PANEL
Anion gap: 7 (ref 5–15)
BUN: 24 mg/dL — ABNORMAL HIGH (ref 6–20)
CO2: 30 mmol/L (ref 22–32)
Calcium: 8.5 mg/dL — ABNORMAL LOW (ref 8.9–10.3)
Chloride: 101 mmol/L (ref 101–111)
Creatinine, Ser: 1.04 mg/dL (ref 0.61–1.24)
GFR calc Af Amer: >60 mL/min (ref >60)
GFR calc non Af Amer: >60 mL/min (ref >60)
Glucose, Bld: 129 mg/dL — ABNORMAL HIGH (ref 65–99)
Potassium: 4 mmol/L (ref 3.5–5.1)
Sodium: 138 mmol/L (ref 135–145)

## 2016-03-04 LAB — CBC
HCT: 32.5 % — ABNORMAL LOW (ref 39.0–52.0)
Hemoglobin: 10.6 g/dL — ABNORMAL LOW (ref 13.0–17.0)
MCH: 32.3 pg (ref 26.0–34.0)
MCHC: 32.6 g/dL (ref 30.0–36.0)
MCV: 99.1 fL (ref 78.0–100.0)
Platelets: 120 10*3/uL — ABNORMAL LOW (ref 150–400)
RBC: 3.28 MIL/uL — ABNORMAL LOW (ref 4.22–5.81)
RDW: 13.6 % (ref 11.5–15.5)
WBC: 17.1 10*3/uL — ABNORMAL HIGH (ref 4.0–10.5)

## 2016-03-04 LAB — MAGNESIUM: Magnesium: 2.3 mg/dL (ref 1.7–2.4)

## 2016-03-04 MED ORDER — ONDANSETRON HCL 4 MG/2ML IJ SOLN: 4.0000 mg | Freq: Four times a day (QID) | INTRAMUSCULAR | Status: DC | PRN

## 2016-03-04 MED ORDER — BISACODYL 5 MG PO TBEC: 10.0000 mg | DELAYED_RELEASE_TABLET | Freq: Every day | ORAL | Status: DC | PRN

## 2016-03-04 MED ORDER — SODIUM CHLORIDE 0.9% FLUSH: 3.0000 mL | INTRAVENOUS | Status: DC | PRN

## 2016-03-04 MED ORDER — INSULIN ASPART 100 UNIT/ML ~~LOC~~ SOLN
0.0000 [IU] | Freq: Three times a day (TID) | SUBCUTANEOUS | Status: DC
  Administered 2016-03-04 – 2016-03-05 (×3): 2 [IU] via SUBCUTANEOUS

## 2016-03-04 MED ORDER — FUROSEMIDE 40 MG PO TABS
40.0000 mg | ORAL_TABLET | Freq: Every day | ORAL | Status: AC
  Administered 2016-03-04 – 2016-03-06 (×3): 40 mg via ORAL
  Filled 2016-03-04 (×3): qty 1

## 2016-03-04 MED ORDER — DOCUSATE SODIUM 100 MG PO CAPS
200.0000 mg | ORAL_CAPSULE | Freq: Every day | ORAL | Status: DC
  Administered 2016-03-07: 200 mg via ORAL
  Filled 2016-03-04 (×3): qty 2

## 2016-03-04 MED ORDER — MOVING RIGHT ALONG BOOK
Freq: Once | Status: AC
  Administered 2016-03-04: 08:00:00
  Filled 2016-03-04: qty 1

## 2016-03-04 MED ORDER — METOPROLOL TARTRATE 25 MG PO TABS
25.0000 mg | ORAL_TABLET | Freq: Two times a day (BID) | ORAL | Status: DC
  Administered 2016-03-04 – 2016-03-05 (×2): 25 mg via ORAL
  Filled 2016-03-04 (×4): qty 1

## 2016-03-04 MED ORDER — SODIUM CHLORIDE 0.9 % IV SOLN: 250.0000 mL | INTRAVENOUS | Status: DC | PRN

## 2016-03-04 MED ORDER — OXYCODONE HCL 5 MG PO TABS
5.0000 mg | ORAL_TABLET | ORAL | Status: DC | PRN
  Administered 2016-03-04 (×3): 10 mg via ORAL
  Filled 2016-03-04 (×3): qty 2

## 2016-03-04 MED ORDER — ONDANSETRON HCL 4 MG PO TABS: 4.0000 mg | ORAL_TABLET | Freq: Four times a day (QID) | ORAL | Status: DC | PRN

## 2016-03-04 MED ORDER — BISACODYL 10 MG RE SUPP: 10.0000 mg | Freq: Every day | RECTAL | Status: DC | PRN

## 2016-03-04 MED ORDER — POTASSIUM CHLORIDE CRYS ER 20 MEQ PO TBCR
20.0000 meq | EXTENDED_RELEASE_TABLET | Freq: Every day | ORAL | Status: DC
  Administered 2016-03-04 – 2016-03-05 (×2): 20 meq via ORAL
  Filled 2016-03-04 (×2): qty 1

## 2016-03-04 MED ORDER — GUAIFENESIN ER 600 MG PO TB12: 600.0000 mg | ORAL_TABLET | Freq: Two times a day (BID) | ORAL | Status: DC | PRN

## 2016-03-04 MED ORDER — ASPIRIN EC 81 MG PO TBEC
81.0000 mg | DELAYED_RELEASE_TABLET | Freq: Every day | ORAL | Status: DC
  Administered 2016-03-04 – 2016-03-07 (×4): 81 mg via ORAL
  Filled 2016-03-04 (×4): qty 1

## 2016-03-04 MED ORDER — CLOPIDOGREL BISULFATE 75 MG PO TABS
75.0000 mg | ORAL_TABLET | Freq: Every day | ORAL | Status: DC
  Administered 2016-03-04 – 2016-03-07 (×4): 75 mg via ORAL
  Filled 2016-03-04 (×4): qty 1

## 2016-03-04 MED ORDER — TRAMADOL HCL 50 MG PO TABS
50.0000 mg | ORAL_TABLET | ORAL | Status: DC | PRN
  Administered 2016-03-04: 100 mg via ORAL
  Administered 2016-03-05 – 2016-03-06 (×3): 50 mg via ORAL
  Administered 2016-03-06 (×2): 100 mg via ORAL
  Administered 2016-03-07: 50 mg via ORAL
  Filled 2016-03-04 (×6): qty 2
  Filled 2016-03-04: qty 1

## 2016-03-04 MED ORDER — ACETAMINOPHEN 325 MG PO TABS
650.0000 mg | ORAL_TABLET | Freq: Four times a day (QID) | ORAL | Status: DC | PRN
Start: 2016-03-04 — End: 2016-03-07
  Administered 2016-03-05: 650 mg via ORAL
  Filled 2016-03-04: qty 2

## 2016-03-04 MED ORDER — PANTOPRAZOLE SODIUM 40 MG PO TBEC
40.0000 mg | DELAYED_RELEASE_TABLET | Freq: Every day | ORAL | Status: DC
  Administered 2016-03-04 – 2016-03-07 (×4): 40 mg via ORAL
  Filled 2016-03-04 (×4): qty 1

## 2016-03-04 MED ORDER — SODIUM CHLORIDE 0.9% FLUSH
3.0000 mL | Freq: Two times a day (BID) | INTRAVENOUS | Status: DC
  Administered 2016-03-04 – 2016-03-07 (×6): 3 mL via INTRAVENOUS

## 2016-03-04 NOTE — Care Management Important Message (Signed)
Important Message  Patient Details  Name: Hayden Rivera MRN: [id number] Date of Birth: [birth year]   Medicare Important Message Given:  Yes    Hulon Ferron Abena 03/04/2016, 1:02 PM

## 2016-03-04 NOTE — Progress Notes (Signed)
Patient ID: Hayden Rivera, male   DOB: Sep 17, 1950, 65 y.o.   MRN: [id number] TCTS DAILY ICU PROGRESS NOTE                   [name]          [phone]   3 Days Post-Op Procedure(s) (LRB): CORONARY ARTERY BYPASS GRAFTING (CABG)x3 with endoscopic harvesting of right saphenous vein -LIMA to LAD -SVG to LEFT CIRCUMFLEX -RADIAL ARTERY to RAMUS INTERMEDIA (N/A) TRANSESOPHAGEAL ECHOCARDIOGRAM (TEE) (N/A) LEFT RADIAL ARTERY HARVEST (Left)  Total Length of Stay:  LOS: 3 days   Subjective: Feels better now sinus  Objective: Vital signs in last 24 hours: Temp:  [96.9 F (36.1 C)-98 F (36.7 C)] 96.9 F (36.1 C) (10/06 0400) Pulse Rate:  [40-120] 58 (10/06 0700) Cardiac Rhythm: Normal sinus rhythm (10/06 0700) Resp:  [13-23] 16 (10/06 0700) BP: (101-153)/(64-106) 131/76 (10/06 0700) SpO2:  [91 %-98 %] 97 % (10/06 0700)  Filed Weights   03/01/16 0612 03/02/16 0556 03/03/16 0610  Weight: 250 lb (113.4 kg) 261 lb 11 oz (118.7 kg) 258 lb 2.5 oz (117.1 kg)    Weight change:    Hemodynamic parameters for last 24 hours:    Intake/Output from previous day: 10/05 0701 - 10/06 0700 In: 796.1 [I.V.:796.1] Out: 1990 [Urine:1990]  Intake/Output this shift: No intake/output data recorded.  Current Meds: Scheduled Meds: . acetaminophen  1,000 mg Oral Q6H   Or  . acetaminophen (TYLENOL) oral liquid 160 mg/5 mL  1,000 mg Per Tube Q6H  . amiodarone  400 mg Oral Q12H   Followed by  . [START ON 03/11/2016] amiodarone  400 mg Oral Daily  . aspirin EC  325 mg Oral Daily   Or  . aspirin  324 mg Per Tube Daily  . bisacodyl  10 mg Oral Daily   Or  . bisacodyl  10 mg Rectal Daily  . chlorhexidine gluconate (MEDLINE KIT)  15 mL Mouth Rinse BID  . docusate sodium  200 mg Oral Daily  . enoxaparin (LOVENOX) injection  40 mg Subcutaneous QHS  . insulin aspart  0-24 Units Subcutaneous Q4H  . insulin detemir  20 Units Subcutaneous Daily  .  isosorbide mononitrate  15 mg Oral Daily  . mouth rinse  15 mL Mouth Rinse QID  . metoprolol tartrate  25 mg Oral BID   Or  . metoprolol tartrate  25 mg Per Tube BID  . pantoprazole  40 mg Oral Daily  . rosuvastatin  10 mg Oral QPM  . sodium chloride flush  3 mL Intravenous Q12H   Continuous Infusions: . sodium chloride Stopped (03/02/16 1100)  . sodium chloride Stopped (03/02/16 0700)  . sodium chloride Stopped (03/01/16 1900)  . amiodarone 30 mg/hr (03/04/16 0700)  . dexmedetomidine Stopped (03/01/16 2100)  . lactated ringers    . lactated ringers 10 mL/hr at 03/04/16 0700  . phenylephrine (NEO-SYNEPHRINE) Adult infusion Stopped (03/02/16 0900)   PRN Meds:.sodium chloride, diphenhydrAMINE, lactated ringers, metoprolol, morphine injection, ondansetron (ZOFRAN) IV, oxyCODONE, sodium chloride flush, traMADol  General appearance: alert and cooperative Neurologic: intact Heart: regular rate and rhythm, S1, S2 normal, no murmur, click, rub or gallop Lungs: clear to auscultation bilaterally Abdomen: soft, non-tender; bowel sounds normal; no masses,  no organomegaly Extremities: extremities normal, atraumatic, no cyanosis or edema and Homans sign is negative, no sign of DVT Wound: sternum intact  Lab Results:  CBC: Recent Labs  03/03/16 0342 03/04/16 0430  WBC 21.8* 17.1*  HGB 11.9* 10.6*  HCT 36.6* 32.5*  PLT 127* 120*   BMET:  Recent Labs  03/03/16 0342 03/04/16 0430  NA 136 138  K 4.8 4.0  CL 105 101  CO2 25 30  GLUCOSE 140* 129*  BUN 21* 24*  CREATININE 1.12 1.04  CALCIUM 8.4* 8.5*    CMET: Lab Results  Component Value Date   WBC 17.1 (H) 03/04/2016   HGB 10.6 (L) 03/04/2016   HCT 32.5 (L) 03/04/2016   PLT 120 (L) 03/04/2016   GLUCOSE 129 (H) 03/04/2016   CHOL 136 07/16/2015   TRIG 118 07/16/2015   HDL 40 07/16/2015   LDLCALC 72 07/16/2015   ALT 35 03/03/2016   AST 38 03/03/2016   NA 138 03/04/2016   K 4.0 03/04/2016   CL 101 03/04/2016   CREATININE  1.04 03/04/2016   BUN 24 (H) 03/04/2016   CO2 30 03/04/2016   TSH 2.208 03/03/2016   PSA 0.2 10/23/2013   INR 1.67 03/01/2016   HGBA1C 6.2 (H) 02/29/2016    PT/INR:  Recent Labs  03/01/16 1530  LABPROT 19.9*  INR 1.67   Radiology: No results found.   Assessment/Plan: S/P Procedure(s) (LRB): CORONARY ARTERY BYPASS GRAFTING (CABG)x3 with endoscopic harvesting of right saphenous vein -LIMA to LAD -SVG to LEFT CIRCUMFLEX -RADIAL ARTERY to RAMUS INTERMEDIA (N/A) TRANSESOPHAGEAL ECHOCARDIOGRAM (TEE) (N/A) LEFT RADIAL ARTERY HARVEST (Left) Mobilize Diuresis Plan for transfer to step-down: see transfer orders Now in sinus 60 Wbc decreasing   Hayden Rivera 03/04/2016 7:43 AM

## 2016-03-04 NOTE — Care Management Note (Signed)
Case Management Note  Patient Details  Name: Hayden Rivera MRN: [id number] Date of Birth: Jun 20, 1950  Subjective/Objective:    Pt lives with spouse who will be available 24/7.  He reports she is s/p fractured leg but is doing well and is now mobile. Pt feels he is doing well ambulating with staff.                         Expected Discharge Plan:  Home/Self Care  Discharge planning Services  CM Consult  Status of Service:  In process, will continue to follow  Girard Cooter, RN 03/04/2016, 1:02 PM

## 2016-03-04 NOTE — Discharge Instructions (Signed)
Coronary Artery Bypass Grafting, Care After °Refer to this sheet in the next few weeks. These instructions provide you with information on caring for yourself after your procedure. Your health care provider may also give you more specific instructions. Your treatment has been planned according to current medical practices, but problems sometimes occur. Call your health care provider if you have any problems or questions after your procedure. °WHAT TO EXPECT AFTER THE PROCEDURE °Recovery from surgery will be different for everyone. Some people feel well after 3 or 4 weeks, while for others it takes longer. After your procedure, it is typical to have the following: °· Nausea and a lack of appetite.   °· Constipation. °· Weakness and fatigue.   °· Depression or irritability.   °· Pain or discomfort at your incision site. °HOME CARE INSTRUCTIONS °· Take medicines only as directed by your health care provider. Do not stop taking medicines or start any new medicines without first checking with your health care provider. °· Take your pulse as directed by your health care provider. °· Perform deep breathing as directed by your health care provider. If you were given a device called an incentive spirometer, use it to practice deep breathing several times a day. Support your chest with a pillow or your arms when you take deep breaths or cough. °· Keep incision areas clean, dry, and protected. Remove or change any bandages (dressings) only as directed by your health care provider. You may have skin adhesive strips over the incision areas. Do not take the strips off. They will fall off on their own. °· Check incision areas daily for any swelling, redness, or drainage. °· If incisions were made in your legs, do the following: °¨ Avoid crossing your legs.   °¨ Avoid sitting for long periods of time. Change positions every 30 minutes.   °¨ Elevate your legs when you are sitting. °· Wear compression stockings as directed by your  health care provider. These stockings help keep blood clots from forming in your legs. °· Take showers once your health care provider approves. Until then, only take sponge baths. Pat incisions dry. Do not rub incisions with a washcloth or towel. Do not take baths, swim, or use a hot tub until your health care provider approves. °· Eat foods that are high in fiber, such as raw fruits and vegetables, whole grains, beans, and nuts. Meats should be lean cut. Avoid canned, processed, and fried foods. °· Drink enough fluid to keep your urine clear or pale yellow. °· Weigh yourself every day. This helps identify if you are retaining fluid that may make your heart and lungs work harder. °· Rest and limit activity as directed by your health care provider. You may be instructed to: °¨ Stop any activity at once if you have chest pain, shortness of breath, irregular heartbeats, or dizziness. Get help right away if you have any of these symptoms. °¨ Move around frequently for short periods or take short walks as directed by your health care provider. Increase your activities gradually. You may need physical therapy or cardiac rehabilitation to help strengthen your muscles and build your endurance. °¨ Avoid lifting, pushing, or pulling anything heavier than 10 lb (4.5 kg) for at least 6 weeks after surgery. °· Do not drive until your health care provider approves.  °· Ask your health care provider when you may return to work. °· Ask your health care provider when you may resume sexual activity. °· Keep all follow-up visits as directed by your health care   provider. This is important. °SEEK MEDICAL CARE IF: °· You have swelling, redness, increasing pain, or drainage at the site of an incision. °· You have a fever. °· You have swelling in your ankles or legs. °· You have pain in your legs.   °· You gain 2 or more pounds (0.9 kg) a day. °· You are nauseous or vomit. °· You have diarrhea.  °SEEK IMMEDIATE MEDICAL CARE IF: °· You have  chest pain that goes to your jaw or arms. °· You have shortness of breath.   °· You have a fast or irregular heartbeat.   °· You notice a "clicking" in your breastbone (sternum) when you move.   °· You have numbness or weakness in your arms or legs. °· You feel dizzy or light-headed.   °MAKE SURE YOU: °· Understand these instructions. °· Will watch your condition. °· Will get help right away if you are not doing well or get worse. °  °This information is not intended to replace advice given to you by your health care provider. Make sure you discuss any questions you have with your health care provider. °  °Document Released: 12/03/2004 Document Revised: 06/06/2014 Document Reviewed: 10/23/2012 °Elsevier Interactive Patient Education ©2016 [employer] ° °

## 2016-03-04 NOTE — Discharge Summary (Signed)
Physician Discharge Summary       [address]       [place] [address]             [phone]    Patient ID: Hayden Rivera MRN: [id number] DOB/AGE: Jul 10, 1950 65 y.o.  Admit date: 03/01/2016 Discharge date: 03/07/2016  Admission Diagnoses: Coronary artery disease   Active Diagnoses:  1. S/p STEMI 05'-s/p PCI with stents 2. Hypertension 3. Ischemic cardiomyopathy 4. 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 Arthritis 6. GERD (gastroesophageal reflux disease) 7. History of kidney stones 8. ABL anemia 9. Thrombocytopenia 10. A fib  Procedure (s):  Coronary artery bypass grafting x3 with the left internal mammary to the left anterior descending coronary artery, left radial bypass to the intermediate coronary artery, and reverse saphenous vein graft to the circumflex coronary artery with right thigh greater saphenous vein harvesting endoscopically by Dr. Servando Snare on 03/03/2016.  History of Presenting Illness: This is a 65 y.o. male was  seen in the office  Referred by  cardiology for coronary artery bypass grafting consideration. Patient has a long history of known coronary artery disease. He presented in 2005 after cardiac arrest in [place] within anterior infarct. A stent was placed in the LAD by Dr. Gwenlyn Found. The following year after his Plavix was stopped, he had a second stent placed within the first and the LAD while in [place]. In 2014, catheterization with stenting of the circumflex was performed at [facility]. Cardiac catheterization in 2016 showed stable disease.   Over the past several weeks, the patient noted increasing discomfort in his chest especially when playing golf and walking uphill. He also noted episodes of substernal burning all walking his dogs associated with jaw pain. He denies any rest angina denies shortness of breath or diaphoresis.   Patient has no history of diabetes. He is a previous smoker but quit more than 15  years ago.  He has known long history of coronary artery disease now with progressive disease in the proximal circumflex and left main artery into some degree in the proximal LAD. With the patient's new onset of symptoms and current anatomy, Dr. Servando Snare agreed with the recommendation to proceed with coronary artery bypass grafting. He discussed the case with Dr. Aundra Dubin. Patient has held his Plavix. Pre operative carotid duplex showed no significant internal carotid artery disease bilaterally. He was admitted to [facility] on 03/01/2016 in order to undergo a CABG x 3.  Brief Hospital Course:  The patient was extubated the evening of surgery without difficulty. He/she remained afebrile and hemodynamically stable. Gordy Councilman, a line, chest tubes, and foley were removed early in the post operative course. Lopressor was started and titrated accordingly. He went into a fib. He was started on an Amiodarone drip. He converted to sinus rhythm ane was put on oral Amiodarone. He was volume over loaded and diuresed. He had ABL anemia. He did not require a post op transfusion. His last H and H was 10.2. He was weaned off the insulin drip. The patient's HGA1C pre op was 6.2. 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 He will need further surveillance of his HGA1C as an outpatient by his primary care physician. The patient was felt surgically stable for transfer from the ICU to [facility]U for further convalescence on 03/04/2016. He/she continues to progress with cardiac rehab. He was ambulating on room air. He has been tolerating a diet and has had a bowel movement. Epicardial pacing wires and chest tube sutures  will be removed prior to discharge. The patient is felt surgically stable for discharge today.   Latest Vital Signs: Blood pressure 112/64, pulse (!) 57, temperature 98.2 F (36.8 C), temperature source Oral, resp. rate 20, height 5\' 11"  (1.803 m), weight 251 lb 3.2 oz (113.9 kg), SpO2 97 %.  Physical Exam: General appearance:  alert and cooperative Neurologic: intact Heart: regular rate and rhythm, S1, S2 normal, no murmur, click, rub or gallop Lungs: clear to auscultation bilaterally Abdomen: soft, non-tender; bowel sounds normal; no masses,  no organomegaly Extremities: extremities normal, atraumatic, no cyanosis or edema and Homans sign is negative, no sign of DVT Wound: sternum intact  Discharge Condition:Stable and discharged to home.  Recent laboratory studies:  Lab Results  Component Value Date   WBC 14.7 (H) 03/05/2016   HGB 10.2 (L) 03/05/2016   HCT 31.7 (L) 03/05/2016   MCV 99.1 03/05/2016   PLT 144 (L) 03/05/2016   Lab Results  Component Value Date   NA 138 03/06/2016   K 3.2 (L) 03/06/2016   CL 104 03/06/2016   CO2 26 03/06/2016   CREATININE 1.07 03/06/2016   GLUCOSE 106 (H) 03/06/2016    Diagnostic Studies:  Dg Chest Port 1 View  Result Date: 03/04/2016 CLINICAL DATA:  Chest tube removal. EXAM: PORTABLE CHEST 1 VIEW COMPARISON:  03/03/2016. FINDINGS: Interim removal of left chest tube. No pneumothorax. Right IJ sheath in stable position. Prior CABG. Cardiomegaly with normal pulmonary vascularity. Low lung volumes. IMPRESSION: 1. An removal of left chest tube.  No pneumothorax. 2.  Low lung volumes with mild bibasilar atelectasis. 3.  Prior CABG.  Stable cardiomegaly. Electronically Signed   By: Marcello Moores  Register   On: 03/04/2016 07:43   Discharge Instructions    Amb Referral to Cardiac Rehabilitation    Complete by:  As directed    Diagnosis:  CABG   CABG X ___:  3     Discharge Medications:   Medication List    STOP taking these medications   losartan 100 MG tablet Commonly known as:  COZAAR   nitroGLYCERIN 0.4 MG SL tablet Commonly known as:  NITROSTAT     TAKE these medications   acetaminophen 500 MG tablet Commonly known as:  TYLENOL Take 500 mg by mouth every 6 (six) hours as needed (PAIN).   amiodarone 200 MG tablet Commonly known as:  PACERONE Take 1 tablet (200  mg total) by mouth 2 (two) times daily. For 7 Days, then decrease to 200 mg daily   aspirin 81 MG tablet Take 1 tablet (81 mg total) by mouth daily. What changed:  Another medication with the same name was removed. Continue taking this medication, and follow the directions you see here.   cholecalciferol 1000 units tablet Commonly known as:  VITAMIN D Take 2,000 Units by mouth daily.   clopidogrel 75 MG tablet Commonly known as:  PLAVIX Take 75 mg by mouth at bedtime.   CoQ10 100 MG Caps Take 100 mg by mouth daily.   Fish Oil 1200 MG Caps Take 1 capsule by mouth 2 (two) times daily.   guaiFENesin 600 MG 12 hr tablet Commonly known as:  MUCINEX Take 2 tablets (1,200 mg total) by mouth 2 (two) times daily as needed.   isosorbide mononitrate 30 MG 24 hr tablet Commonly known as:  IMDUR Take 0.5 tablets (15 mg total) by mouth daily. For 30 days What changed:  medication strength  how much to take  additional instructions   metoprolol  tartrate 25 MG tablet Commonly known as:  LOPRESSOR Take 12.5 mg by mouth 2 (two) times daily.   pantoprazole 40 MG tablet Commonly known as:  PROTONIX Take 1 tablet (40 mg total) by mouth daily.   rosuvastatin 10 MG tablet Commonly known as:  CRESTOR Take 1 tablet (10 mg total) by mouth daily. What changed:  when to take this   traMADol 50 MG tablet Commonly known as:  ULTRAM Take 1-2 tablets (50-100 mg total) by mouth every 4 (four) hours as needed for moderate pain.      The patient has been discharged on:   1.Beta Blocker:  Yes [ x  ]                              No   [   ]                              If No, reason:  2.Ace Inhibitor/ARB: Yes [   ]                                     No  [ x   ]                                     If No, reason: labile BP, allergy to ACE  3.Statin:   Yes [ x  ]                  No  [   ]                  If No, reason:  4.Ecasa:  Yes  [  x ]                  No   [   ]                   If No, reason:  Follow Up Appointments: Follow-up Information    Grace Isaac, MD Follow up on 04/07/2016.   Specialty:  Cardiothoracic Surgery Why:  PA/LAT CXR to be taken (at [facility] which is in the same building as Dr. Everrett Coombe office)on11/01/2016 at 12:30 [id number] time is at 1:00 pm  Contact information: [address] [place] [address] [phone]        Scott Weaver, PA-C Follow up on 03/18/2016.   Specialties:  Cardiology, Physician Assistant Why:  Appointment time is at 9:45 am Contact information: [address] [place] [address] [phone]           Signed: Cinda Quest 03/07/2016, 7:53 AM

## 2016-03-05 ENCOUNTER — Inpatient Hospital Stay (HOSPITAL_COMMUNITY): Payer: Commercial Managed Care - HMO

## 2016-03-05 LAB — GLUCOSE, CAPILLARY
Glucose-Capillary: 129 mg/dL — ABNORMAL HIGH (ref 65–99)
Glucose-Capillary: 129 mg/dL — ABNORMAL HIGH (ref 65–99)
Glucose-Capillary: 105 mg/dL — ABNORMAL HIGH (ref 65–99)
Glucose-Capillary: 115 mg/dL — ABNORMAL HIGH (ref 65–99)

## 2016-03-05 LAB — CBC
HCT: 31.7 % — ABNORMAL LOW (ref 39.0–52.0)
Hemoglobin: 10.2 g/dL — ABNORMAL LOW (ref 13.0–17.0)
MCH: 31.9 pg (ref 26.0–34.0)
MCHC: 32.2 g/dL (ref 30.0–36.0)
MCV: 99.1 fL (ref 78.0–100.0)
Platelets: 144 10*3/uL — ABNORMAL LOW (ref 150–400)
RBC: 3.2 MIL/uL — ABNORMAL LOW (ref 4.22–5.81)
RDW: 13.5 % (ref 11.5–15.5)
WBC: 14.7 10*3/uL — ABNORMAL HIGH (ref 4.0–10.5)

## 2016-03-05 LAB — BASIC METABOLIC PANEL
Anion gap: 8 (ref 5–15)
BUN: 25 mg/dL — ABNORMAL HIGH (ref 6–20)
CO2: 26 mmol/L (ref 22–32)
Calcium: 8.3 mg/dL — ABNORMAL LOW (ref 8.9–10.3)
Chloride: 103 mmol/L (ref 101–111)
Creatinine, Ser: 1.05 mg/dL (ref 0.61–1.24)
GFR calc Af Amer: >60 mL/min (ref >60)
GFR calc non Af Amer: >60 mL/min (ref >60)
Glucose, Bld: 138 mg/dL — ABNORMAL HIGH (ref 65–99)
Potassium: 3.6 mmol/L (ref 3.5–5.1)
Sodium: 137 mmol/L (ref 135–145)

## 2016-03-05 MED ORDER — AMIODARONE HCL 200 MG PO TABS
200.0000 mg | ORAL_TABLET | Freq: Two times a day (BID) | ORAL | Status: DC
  Administered 2016-03-05 – 2016-03-07 (×4): 200 mg via ORAL
  Filled 2016-03-05 (×3): qty 1

## 2016-03-05 MED ORDER — METOPROLOL TARTRATE 12.5 MG HALF TABLET: 12.5000 mg | ORAL_TABLET | Freq: Two times a day (BID) | ORAL | Status: DC

## 2016-03-05 MED ORDER — INFLUENZA VAC SPLIT QUAD 0.5 ML IM SUSY
0.5000 mL | PREFILLED_SYRINGE | INTRAMUSCULAR | Status: AC
  Administered 2016-03-07: 0.5 mL via INTRAMUSCULAR

## 2016-03-05 MED ORDER — AMIODARONE HCL 200 MG PO TABS: 200.0000 mg | ORAL_TABLET | Freq: Every day | ORAL | Status: DC

## 2016-03-05 MED ORDER — DIPHENHYDRAMINE HCL 25 MG PO CAPS
50.0000 mg | ORAL_CAPSULE | Freq: Every evening | ORAL | Status: DC | PRN
  Administered 2016-03-05 – 2016-03-06 (×2): 50 mg via ORAL
  Filled 2016-03-05 (×2): qty 2

## 2016-03-05 NOTE — Progress Notes (Addendum)
      [address]       [place] [address]             [phone]      4 Days Post-Op Procedure(s) (LRB): CORONARY ARTERY BYPASS GRAFTING (CABG)x3 with endoscopic harvesting of right saphenous vein -LIMA to LAD -SVG to LEFT CIRCUMFLEX -RADIAL ARTERY to RAMUS INTERMEDIA (N/A) TRANSESOPHAGEAL ECHOCARDIOGRAM (TEE) (N/A) LEFT RADIAL ARTERY HARVEST (Left) Subjective: Feels good this morning. Did have some difficultly sleeping due to "anxiety"  Objective: Vital signs in last 24 hours: Temp:  [97.5 F (36.4 C)-98.8 F (37.1 C)] 97.5 F (36.4 C) (10/07 0514) Pulse Rate:  [61-72] 61 (10/07 0514) Cardiac Rhythm: Normal sinus rhythm (10/07 0815) Resp:  [17-25] 20 (10/07 0514) BP: (114-131)/(70-76) 120/70 (10/07 0514) SpO2:  [90 %-95 %] 95 % (10/07 0514) Weight:  [252 lb 8 oz (114.5 kg)] 252 lb 8 oz (114.5 kg) (10/07 0514)     Intake/Output from previous day: 10/06 0701 - 10/07 0700 In: 490 [P.O.:480; I.V.:10] Out: 200 [Urine:200] Intake/Output this shift: Total I/O In: 240 [P.O.:240] Out: -   General appearance: alert and no distress Heart: regular rate and rhythm, S1, S2 normal, no murmur, click, rub or gallop Lungs: clear to auscultation bilaterally Abdomen: soft, non-tender; bowel sounds normal; no masses,  no organomegaly Extremities: extremities normal, atraumatic, no cyanosis or edema Wound: c/d/i without   Lab Results:  Recent Labs  03/04/16 0430 03/05/16 0301  WBC 17.1* 14.7*  HGB 10.6* 10.2*  HCT 32.5* 31.7*  PLT 120* 144*   BMET:  Recent Labs  03/04/16 0430 03/05/16 0301  NA 138 137  K 4.0 3.6  CL 101 103  CO2 30 26  GLUCOSE 129* 138*  BUN 24* 25*  CREATININE 1.04 1.05  CALCIUM 8.5* 8.3*    PT/INR: No results for input(s): LABPROT, INR in the last 72 hours. ABG    Component Value Date/Time   PHART 7.346 (L) 03/01/2016 2336   HCO3 20.6 03/01/2016 2336   TCO2 24 03/02/2016 1657   ACIDBASEDEF 4.0 (H) 03/01/2016 2336   O2SAT  94.0 03/01/2016 2336   CBG (last 3)   Recent Labs  03/04/16 2153 03/05/16 0634 03/05/16 1119  GLUCAP 123* 129* 129*    Assessment/Plan: S/P Procedure(s) (LRB): CORONARY ARTERY BYPASS GRAFTING (CABG)x3 with endoscopic harvesting of right saphenous vein -LIMA to LAD -SVG to LEFT CIRCUMFLEX -RADIAL ARTERY to RAMUS INTERMEDIA (N/A) TRANSESOPHAGEAL ECHOCARDIOGRAM (TEE) (N/A) LEFT RADIAL ARTERY HARVEST (Left) 1. CV-NSR 60s. BP stable. BB and Amio for rhythm control. Keep wires one more day.  2. Pulm-Very small left pleural effusion on chest xray. No pneumothorax.  3. Renal-creatinine stable 1.05. Weight trending down.  4. Endo-good blood glucose control 5. H & H stable 6. ID- WBC trending down. Will trend  Plan: Keep wires one more day. Rhythm stable. Continue to mobilize and work with IS hourly. Will order benadryl prn for insomnia.     LOS: 4 days    Hayden Rivera 03/05/2016  Progressing, no more afib  Poss home one to 2 days  I have seen and examined Hayden Rivera and agree with the above assessment  and plan.  Grace Isaac MD Beeper [phone] Office [phone] 03/05/2016 12:47 PM

## 2016-03-05 NOTE — Progress Notes (Signed)
CARDIAC REHAB PHASE I   PRE:  Rate/Rhythm: 65 NSR  BP:   Sitting: 116/72     SaO2: 96% RA  MODE:  Ambulation: 350 ft   POST:  Rate/Rhythm: 73 NSR  BP:   Sitting: 123/67     SaO2: 94-95% RA  1254-1250  Pt ambulated 356ft with one person A and gait belt. Pt maintained steady gait. Pt denied SOB, dizziness, or CP. Returned pt to recliner in which chair and bed mobility was discussed and demonstrated. Pt was able to perform sit to stand without the use of UE. Practiced IS and discussed the importance of using it. Discussed wound care, activity and lifting restrictions/sternal precautions. Also discussed HH diet and exercise guidelines. Pt did teach back and seem eager to be moving. Referred pt to [facility] and [facility].  Keaundre Thelin D Keidra Withers,MS,ACSM-RCEP 03/05/2016 1:45 PM

## 2016-03-06 LAB — GLUCOSE, CAPILLARY
Glucose-Capillary: 132 mg/dL — ABNORMAL HIGH (ref 65–99)
Glucose-Capillary: 111 mg/dL — ABNORMAL HIGH (ref 65–99)
Glucose-Capillary: 103 mg/dL — ABNORMAL HIGH (ref 65–99)
Glucose-Capillary: 103 mg/dL — ABNORMAL HIGH (ref 65–99)

## 2016-03-06 LAB — BASIC METABOLIC PANEL
Anion gap: 8 (ref 5–15)
BUN: 25 mg/dL — ABNORMAL HIGH (ref 6–20)
CO2: 26 mmol/L (ref 22–32)
Calcium: 8.3 mg/dL — ABNORMAL LOW (ref 8.9–10.3)
Chloride: 104 mmol/L (ref 101–111)
Creatinine, Ser: 1.07 mg/dL (ref 0.61–1.24)
GFR calc Af Amer: >60 mL/min (ref >60)
GFR calc non Af Amer: >60 mL/min (ref >60)
Glucose, Bld: 106 mg/dL — ABNORMAL HIGH (ref 65–99)
Potassium: 3.2 mmol/L — ABNORMAL LOW (ref 3.5–5.1)
Sodium: 138 mmol/L (ref 135–145)

## 2016-03-06 MED ORDER — POTASSIUM CHLORIDE CRYS ER 20 MEQ PO TBCR
40.0000 meq | EXTENDED_RELEASE_TABLET | Freq: Two times a day (BID) | ORAL | Status: DC
  Administered 2016-03-06 – 2016-03-07 (×3): 40 meq via ORAL
  Filled 2016-03-06 (×3): qty 2

## 2016-03-06 MED ORDER — METOPROLOL TARTRATE 12.5 MG HALF TABLET
12.5000 mg | ORAL_TABLET | Freq: Two times a day (BID) | ORAL | Status: DC
  Administered 2016-03-06 – 2016-03-07 (×3): 12.5 mg via ORAL
  Filled 2016-03-06 (×3): qty 1

## 2016-03-06 MED ORDER — GUAIFENESIN ER 600 MG PO TB12
1200.0000 mg | ORAL_TABLET | Freq: Two times a day (BID) | ORAL | Status: DC
  Administered 2016-03-06 – 2016-03-07 (×3): 1200 mg via ORAL
  Filled 2016-03-06 (×3): qty 2

## 2016-03-06 NOTE — Progress Notes (Addendum)
[address]       [place] [address]             [phone]      5 Days Post-Op Procedure(s) (LRB): CORONARY ARTERY BYPASS GRAFTING (CABG)x3 with endoscopic harvesting of right saphenous vein -LIMA to LAD -SVG to LEFT CIRCUMFLEX -RADIAL ARTERY to RAMUS INTERMEDIA (N/A) TRANSESOPHAGEAL ECHOCARDIOGRAM (TEE) (N/A) LEFT RADIAL ARTERY HARVEST (Left) Subjective: Slept much better last night. Shares he is having slightly more pain in his right leg.   Objective: Vital signs in last 24 hours: Temp:  [97.7 F (36.5 C)-98.3 F (36.8 C)] 97.7 F (36.5 C) (10/08 0527) Pulse Rate:  [58-61] 58 (10/08 0527) Cardiac Rhythm: Normal sinus rhythm (10/07 2200) Resp:  [16-18] 16 (10/08 0527) BP: (105-113)/(62-67) 113/62 (10/08 0527) SpO2:  [96 %-97 %] 96 % (10/08 0527) Weight:  [250 lb 6.4 oz (113.6 kg)] 250 lb 6.4 oz (113.6 kg) (10/08 0527)     Intake/Output from previous day: 10/07 0701 - 10/08 0700 In: 600 [P.O.:600] Out: 200 [Urine:200] Intake/Output this shift: No intake/output data recorded.  General appearance: alert and no distress Heart: regular rate and rhythm, S1, S2 normal, no murmur, click, rub or gallop Lungs: clear to auscultation bilaterally Abdomen: soft, non-tender; bowel sounds normal; no masses,  no organomegaly Extremities: extremities normal, atraumatic, no cyanosis or edema Wound: c/d/i without drainage, right EVH incision c/d/i without drainage. Extensive hematoma from groin to harvest sight. Soft to touch.   Lab Results:  Recent Labs  03/04/16 0430 03/05/16 0301  WBC 17.1* 14.7*  HGB 10.6* 10.2*  HCT 32.5* 31.7*  PLT 120* 144*   BMET:  Recent Labs  03/05/16 0301 03/06/16 0318  NA 137 138  K 3.6 3.2*  CL 103 104  CO2 26 26  GLUCOSE 138* 106*  BUN 25* 25*  CREATININE 1.05 1.07  CALCIUM 8.3* 8.3*    PT/INR: No results for input(s): LABPROT, INR in the last 72 hours. ABG    Component Value Date/Time   PHART 7.346 (L)  03/01/2016 2336   HCO3 20.6 03/01/2016 2336   TCO2 24 03/02/2016 1657   ACIDBASEDEF 4.0 (H) 03/01/2016 2336   O2SAT 94.0 03/01/2016 2336   CBG (last 3)   Recent Labs  03/05/16 1628 03/05/16 2131 03/06/16 0636  GLUCAP 105* 115* 132*    Assessment/Plan: S/P Procedure(s) (LRB): CORONARY ARTERY BYPASS GRAFTING (CABG)x3 with endoscopic harvesting of right saphenous vein -LIMA to LAD -SVG to LEFT CIRCUMFLEX -RADIAL ARTERY to RAMUS INTERMEDIA (N/A) TRANSESOPHAGEAL ECHOCARDIOGRAM (TEE) (N/A) LEFT RADIAL ARTERY HARVEST (Left)  1. CV-NSR 60s. BP stable. BB and Amio for rhythm control. D/C wires today.  2. Pulm-Very small left pleural effusion on chest xray yesterday. No pneumothorax.  3. Renal-creatinine stable 1.07. Weight trending down.  4. Endo-good blood glucose control 5. H & H stable 6. ID- WBC trending down.  7. Anticoagulation- Plavix and ASA, Lovenox 8. EVH site right leg c/d/i, hematoma along the medial aspect of the leg from groin to harvest sight. It is soft but tender.    Plan: Rhythm stable. Discontinue pacing wires today if okay with attending. Continue to mobilize and work with IS hourly. Likely home tomorrow if remains stable.     LOS: 5 days    Elgie Collard 03/06/2016   Replacing k D/c wires  Poss home tomorrow I have seen and examined Hayden Rivera and agree with the above assessment  and plan.  Grace Isaac MD  Beeper [id number] Office [phone] 03/06/2016 10:41 AM

## 2016-03-06 NOTE — Progress Notes (Signed)
Dc';ed paceinag wires pt. tolerated well

## 2016-03-07 DIAGNOSIS — Z23 Encounter for immunization: Secondary | ICD-10-CM | POA: Diagnosis not present

## 2016-03-07 LAB — GLUCOSE, CAPILLARY
Glucose-Capillary: 94 mg/dL (ref 65–99)
Glucose-Capillary: 86 mg/dL (ref 65–99)

## 2016-03-07 MED ORDER — AMIODARONE HCL 200 MG PO TABS: 200.0000 mg | ORAL_TABLET | Freq: Two times a day (BID) | ORAL | 3 refills | Status: DC

## 2016-03-07 MED ORDER — TRAMADOL HCL 50 MG PO TABS: 50.0000 mg | ORAL_TABLET | ORAL | 0 refills | Status: DC | PRN

## 2016-03-07 MED ORDER — ISOSORBIDE MONONITRATE ER 30 MG PO TB24: 15.0000 mg | ORAL_TABLET | Freq: Every day | ORAL | 0 refills | Status: DC

## 2016-03-07 MED ORDER — GUAIFENESIN ER 600 MG PO TB12: 1200.0000 mg | ORAL_TABLET | Freq: Two times a day (BID) | ORAL | Status: DC | PRN

## 2016-03-07 NOTE — Care Management Note (Signed)
Case Management Note Marvetta Gibbons RN, BSN Unit 2W-Case Manager [phone]  Patient Details  Name: Hayden Rivera MRN: [id number] Date of Birth: 1950-08-20  Subjective/Objective:  Pt s/p CABGx3- tx from ICU to 2W on 03/04/16                  Action/Plan: PTA pt lived at home with wife- plan to return home with wife-   Expected Discharge Date:   03/07/16               Expected Discharge Plan:  Home/Self Care  In-House Referral:     Discharge planning Services  CM Consult  Post Acute Care Choice:    Choice offered to:     DME Arranged:    DME Agency:     HH Arranged:    [place] Agency:     Status of Service:  Completed, signed off  If discussed at [employer] of Stay Meetings, dates discussed:    Additional Comments:  Dawayne Patricia, RN 03/07/2016, 9:45 AM

## 2016-03-07 NOTE — Progress Notes (Signed)
Patient given, AVS,  discharge instructions, medication list and paper prescriptions along with follow up appointments. Patient questions answered and CT sutures removed and steri strips applied. Will discharge home as ordered. IV and tele removed Helina Hullum, Bettina Gavia RN

## 2016-03-07 NOTE — Progress Notes (Signed)
      [address]       [place] [address]             [phone]      6 Days Post-Op Procedure(s) (LRB): CORONARY ARTERY BYPASS GRAFTING (CABG)x3 with endoscopic harvesting of right saphenous vein -LIMA to LAD -SVG to LEFT CIRCUMFLEX -RADIAL ARTERY to RAMUS INTERMEDIA (N/A) TRANSESOPHAGEAL ECHOCARDIOGRAM (TEE) (N/A) LEFT RADIAL ARTERY HARVEST (Left)   Subjective:  Hayden Rivera has no new complaints.  His RLE remains tender in the thigh area.  + ambulation  + BM  Objective: Vital signs in last 24 hours: Temp:  [98.2 F (36.8 C)-98.8 F (37.1 C)] 98.2 F (36.8 C) (10/09 0556) Pulse Rate:  [57-63] 57 (10/09 0556) Cardiac Rhythm: Normal sinus rhythm (10/08 2200) Resp:  [18-20] 20 (10/09 0556) BP: (100-113)/(62-66) 112/64 (10/09 0556) SpO2:  [96 %-97 %] 97 % (10/09 0556) Weight:  [251 lb 3.2 oz (113.9 kg)] 251 lb 3.2 oz (113.9 kg) (10/09 0556)  Intake/Output from previous day: 10/08 0701 - 10/09 0700 In: 600 [P.O.:600] Out: 225 [Urine:225]  General appearance: alert, cooperative and no distress Heart: regular rate and rhythm Lungs: clear to auscultation bilaterally Abdomen: soft, non-tender; bowel sounds normal; no masses,  no organomegaly Extremities: edema trace, RLE thigh is ecchymotic from [facility] Wound: clean and dry  Lab Results:  Recent Labs  03/05/16 0301  WBC 14.7*  HGB 10.2*  HCT 31.7*  PLT 144*   BMET:  Recent Labs  03/05/16 0301 03/06/16 0318  NA 137 138  K 3.6 3.2*  CL 103 104  CO2 26 26  GLUCOSE 138* 106*  BUN 25* 25*  CREATININE 1.05 1.07  CALCIUM 8.3* 8.3*    PT/INR: No results for input(s): LABPROT, INR in the last 72 hours. ABG    Component Value Date/Time   PHART 7.346 (L) 03/01/2016 2336   HCO3 20.6 03/01/2016 2336   TCO2 24 03/02/2016 1657   ACIDBASEDEF 4.0 (H) 03/01/2016 2336   O2SAT 94.0 03/01/2016 2336   CBG (last 3)   Recent Labs  03/06/16 1640 03/06/16 2144 03/07/16 0615  GLUCAP 103* 103* 94     Assessment/Plan: S/P Procedure(s) (LRB): CORONARY ARTERY BYPASS GRAFTING (CABG)x3 with endoscopic harvesting of right saphenous vein -LIMA to LAD -SVG to LEFT CIRCUMFLEX -RADIAL ARTERY to RAMUS INTERMEDIA (N/A) TRANSESOPHAGEAL ECHOCARDIOGRAM (TEE) (N/A) LEFT RADIAL ARTERY HARVEST (Left)  1. CV- NSR in the 60s- continue Amiodarone, Lopressor 2. Pulm- no acute issues, continue IS at discharge 3. Renal creatinine has been ok, weight is stable, Lasix regimen is completed 4. Dispo- patient stable, will d/c home today   LOS: 6 days    Hayden Rivera 03/07/2016

## 2016-03-07 NOTE — Progress Notes (Signed)
CARDIAC REHAB PHASE I   PRE:  Rate/Rhythm: 65 SR  BP:  Sitting: 124/65        SaO2: 100 RA  MODE:  Ambulation: 500 ft   POST:  Rate/Rhythm: 69 SR  BP:  Sitting: 142/70         SaO2: 98 RA  Pt ambulated 500 ft on RA, hand held assist, steady gait, tolerated well with no complaints. Clarified pt's preference for phase 2 cardiac rehab, as [facility] no longer has a program. Pt states he prefers to attend  program, will send referral to [place] per pt request. Cardiac surgery discharge education completed with pt and family Saturday, pt states he has no questions at this time. Pt to recliner after walk, feet elevated, call bell within reach.   [id number] Lenna Sciara, RN, BSN 03/07/2016 8:34 AM

## 2016-03-08 ENCOUNTER — Telehealth: Payer: Self-pay | Admitting: *Deleted

## 2016-03-08 NOTE — Consult Note (Signed)
            [facility] CM Primary Care Navigator  03/08/2016  Nicoli Grahan 08-24-1950 [id number]  Attempted to see patient at the bedside to identify possible discharge needs but patient was discharged yesterday.   Call placed to primary care provider's office and spoke to Raynelle Highland. Notified her of patient's discharge and need to follow-up for transition of care. States will relay message to nurse assigned to call and follow-up with patient.  For additional questions please contact:  Edwena Felty A. Oluwadamilare Tobler, BSN, RN-BC [facility] PRIMARY CARE Navigator Cell: [phone]

## 2016-03-11 ENCOUNTER — Telehealth: Payer: Self-pay | Admitting: Physician Assistant

## 2016-03-11 NOTE — Telephone Encounter (Signed)
I spoke with pt and his wife in regards to patients insomnia and cold symptoms.  Pt c/o waking up "having anxiety", not being able to sleep continuously; benadryl does not help. He c/o tinnitus, cough, headache behind right eye, and, pressure in ears. He states he has been doing spirometry as directed. He states he is not having chest pain, no pain medication in 2 days. I advised him since he is so recently s/p CABG x3 they should contact the surgeon's office. Dr Servando Snare [phone]. I gave contact information to wife. She voiced understanding and agreed with plan. Pt denies having PCP at this time.

## 2016-03-11 NOTE — Telephone Encounter (Signed)
Follow Up:    Pt had triple [place] surgery on 03-01-16 and came home 03-07-16. Pt is now having dome problems.Pt can not sleep at night at all,his head feels like it is clogged up and he feels real anxious. Please call to advise.

## 2016-03-14 NOTE — Telephone Encounter (Signed)
I spoke to patient, who asked me to speak to his wife. I spoke with his wife. She states [facility] did call them back and advise him of what he can take. She states he is some better today. FYI

## 2016-03-14 NOTE — Telephone Encounter (Signed)
[address] [place], [place]   03/14/2016 4:54 PM

## 2016-03-14 NOTE — Telephone Encounter (Signed)
Coricidin, Tylenol, Robitussin DM are all acceptable cold medicines. He can try OTC Melatonin to help with sleep.  If this is not successful, he should FU with his PCP for prescription sleep aides. Richardson Dopp, PA-C   03/14/2016 1:15 PM

## 2016-03-15 ENCOUNTER — Ambulatory Visit (INDEPENDENT_AMBULATORY_CARE_PROVIDER_SITE_OTHER): Payer: Self-pay | Admitting: Cardiothoracic Surgery

## 2016-03-15 ENCOUNTER — Other Ambulatory Visit: Payer: Self-pay | Admitting: *Deleted

## 2016-03-15 ENCOUNTER — Encounter: Payer: Self-pay | Admitting: Cardiothoracic Surgery

## 2016-03-15 ENCOUNTER — Ambulatory Visit
Admission: RE | Admit: 2016-03-15 | Discharge: 2016-03-15 | Disposition: A | Discharge status: Home / Self Care | Payer: Commercial Managed Care - HMO | Source: Ambulatory Visit | Attending: Cardiothoracic Surgery | Admitting: Cardiothoracic Surgery

## 2016-03-15 VITALS — BP 123/73 | HR 54 | Temp 97.4°F | Resp 16 | Ht 71.0 in | Wt 240.0 lb

## 2016-03-15 DIAGNOSIS — R05 Cough: Secondary | ICD-10-CM

## 2016-03-15 DIAGNOSIS — R059 Cough, unspecified: Secondary | ICD-10-CM

## 2016-03-15 DIAGNOSIS — Z951 Presence of aortocoronary bypass graft: Secondary | ICD-10-CM

## 2016-03-15 DIAGNOSIS — I2510 Atherosclerotic heart disease of native coronary artery without angina pectoris: Secondary | ICD-10-CM

## 2016-03-15 DIAGNOSIS — R053 Chronic cough: Secondary | ICD-10-CM

## 2016-03-15 MED ORDER — HYDROCODONE-HOMATROPINE 5-1.5 MG/5ML PO SYRP: 5.0000 mL | ORAL_SOLUTION | Freq: Four times a day (QID) | ORAL | 0 refills | Status: DC | PRN

## 2016-03-15 MED ORDER — HYDROCODONE-HOMATROPINE 5-1.5 MG/5ML PO SYRP: 5.0000 mL | ORAL_SOLUTION | ORAL | Status: AC | PRN

## 2016-03-15 MED ORDER — BENZONATATE 100 MG PO CAPS: 100.0000 mg | ORAL_CAPSULE | Freq: Three times a day (TID) | ORAL | 0 refills | Status: DC | PRN

## 2016-03-15 NOTE — Progress Notes (Signed)
[address]       [place] [address]             [phone]      Deion Vawter [place] Medical Record [id number] Date of Birth: 1950-06-08  Referring: Larey Dresser, MD Primary Care: Kenn File, MD  Chief Complaint:   POST OP FOLLOW UP 03/01/2016  OPERATIVE REPORT PREOPERATIVE DIAGNOSIS:  Coronary occlusive disease with angina. POSTOPERATIVE DIAGNOSIS:  Coronary occlusive disease with angina. SURGICAL PROCEDURE:  Coronary artery bypass grafting x3 with the left internal mammary to the left anterior descending coronary artery, left radial bypass to the intermediate coronary artery, and reverse saphenous vein graft to the circumflex coronary artery with right thigh greater saphenous vein harvesting endoscopically. SURGEON:  Lanelle Bal, MD. History of Present Illness:     Patient returns to the office today because of increasing nasal congestion and episodes of coughing. He denies any fever or chills. He does note "his ears are blocked up. He's had no recurrent angina or evidence of congestive heart     Past Medical History:  Diagnosis Date  . Anginal pain ([place])    occ  . Arthritis   . CAD (coronary artery disease)    a. Anterior STEMI 2005 c/b vfib arrest, PCI to LAD at [facility]. b. Stent thrombosis 2006 with DES within prior LAD stent at [facility]. C. 09/2012: s/p balloon angioplasty to mLAD for severe stenosis in previously stented segment & DES to LCx; initial enz neg but ruled in for NSTEMI after post-cath vagal sx, felt d/t distal emboliz of thrombus during case.  . Elevated hemidiaphragm    a. Noted 09/2012 - instructed to f/u PCP.  Marland Kitchen GERD (gastroesophageal reflux disease)   . Heart murmur   . History of kidney stones   . Hypertension   . Ischemic cardiomyopathy    a. Unclear prior EF but pt was told heart was weakened in past. b. EF normal 09/2012.  Marland Kitchen Myocardial infarction   . Shortness of breath dyspnea   . Ventricular fibrillation  ([place])    a. VF arrest 2005 in setting of STEMI.  . Wears glasses      History  Smoking Status  . Former Smoker  . Packs/day: 0.50  . Years: 10.00  . Types: Cigars, Cigarettes  . Quit date: 05/30/2002  Smokeless Tobacco  . Never Used    History  Alcohol Use No     Allergies  Allergen Reactions  . Altace [Ramipril] Shortness Of Breath and Other (See Comments)    cough  . Penicillins Swelling    Has patient had a PCN reaction causing immediate rash, facial/tongue/throat swelling, SOB or lightheadedness with hypotension:unsure Has patient had a PCN reaction causing severe rash involving mucus membranes or skin necrosis:unsure Has patient had a PCN reaction that required hospitalization:No Has patient had a PCN reaction occurring within the last 10 years:NO If all of the above answers are "NO", then may proceed with Cephalosporin use.  Childhood reaction      Current Outpatient Prescriptions  Medication Sig Dispense Refill  . acetaminophen (TYLENOL) 500 MG tablet Take 500 mg by mouth every 6 (six) hours as needed (PAIN).     Marland Kitchen amiodarone (PACERONE) 200 MG tablet Take 1 tablet (200 mg total) by mouth 2 (two) times daily. For 7 Days, then decrease to 200 mg daily 60 tablet 3  . aspirin 81 MG tablet Take 1 tablet (81 mg total) by mouth daily.    Marland Kitchen  Chlorphen-Pseudoephed-APAP (CORICIDIN D PO) Take 1 tablet by mouth every 6 (six) hours.    . cholecalciferol (VITAMIN D) 1000 UNITS tablet Take 2,000 Units by mouth daily.     . clopidogrel (PLAVIX) 75 MG tablet Take 75 mg by mouth at bedtime.     . Coenzyme Q10 (COQ10) 100 MG CAPS Take 200 mg by mouth daily.     Marland Kitchen guaiFENesin (MUCINEX) 600 MG 12 hr tablet Take 2 tablets (1,200 mg total) by mouth 2 (two) times daily as needed.    . isosorbide mononitrate (IMDUR) 30 MG 24 hr tablet Take 0.5 tablets (15 mg total) by mouth daily. For 30 days 30 tablet 0  . Melatonin 5 MG TABS Take 1 tablet by mouth at bedtime as needed.    . metoprolol  tartrate (LOPRESSOR) 25 MG tablet Take 12.5 mg by mouth 2 (two) times daily.    . Omega-3 Fatty Acids (FISH OIL) 1200 MG CAPS Take 1 capsule by mouth 2 (two) times daily.    . pantoprazole (PROTONIX) 40 MG tablet Take 1 tablet (40 mg total) by mouth daily. 30 tablet 6  . rosuvastatin (CRESTOR) 10 MG tablet Take 1 tablet (10 mg total) by mouth daily. (Patient taking differently: Take 10 mg by mouth every evening. ) 90 tablet 3  . traMADol (ULTRAM) 50 MG tablet Take 1-2 tablets (50-100 mg total) by mouth every 4 (four) hours as needed for moderate pain. 30 tablet 0   No current facility-administered medications for this visit.        Physical Exam: BP 123/73   Pulse (!) 54   Temp 97.4 F (36.3 C) (Oral)   Resp 16   Ht 5\' 11"  (1.803 m)   Wt 240 lb (108.9 kg)   SpO2 97% Comment: ON RA  BMI 33.47 kg/m   General appearance: alert and cooperative Neurologic: intact Heart: regular rate and rhythm, S1, S2 normal, no murmur, click, rub or gallop Lungs: clear to auscultation bilaterally Abdomen: soft, non-tender; bowel sounds normal; no masses,  no organomegaly Extremities: extremities normal, atraumatic, no cyanosis or edema and Homans sign is negative, no sign of DVT Wound: Sternum is stable and well-healed left radial harvest sites also well-healed with neurovascular intact left hand. Right thigh Endo vein harvest site is bruised but without evidence of infection   Diagnostic Studies & Laboratory data:     Recent Radiology Findings:   Dg Chest 2 View  Result Date: 03/15/2016 CLINICAL DATA:  CABG.  Prolonged cough. EXAM: CHEST  2 VIEW COMPARISON:  03/05/2016. FINDINGS: Prior CABG. Stable cardiomegaly. No pulmonary venous congestion. Low lung volumes with bibasilar atelectasis, improved prior exam. Small left pleural effusion again noted. IMPRESSION: 1.  Prior CABG.  Stable cardiomegaly. 2. Low lung volumes with mild bibasilar subsegmental atelectasis improved from prior exam. Small left  pleural effusion again noted . No acute pulmonary disease noted. Electronically Signed   By: Marcello Moores  Register   On: 03/15/2016 12:58      Recent Lab Findings: Lab Results  Component Value Date   WBC 14.7 (H) 03/05/2016   HGB 10.2 (L) 03/05/2016   HCT 31.7 (L) 03/05/2016   PLT 144 (L) 03/05/2016   GLUCOSE 106 (H) 03/06/2016   CHOL 136 07/16/2015   TRIG 118 07/16/2015   HDL 40 07/16/2015   LDLCALC 72 07/16/2015   ALT 35 03/03/2016   AST 38 03/03/2016   NA 138 03/06/2016   K 3.2 (L) 03/06/2016   CL 104 03/06/2016  CREATININE 1.07 03/06/2016   BUN 25 (H) 03/06/2016   CO2 26 03/06/2016   TSH 2.208 03/03/2016   INR 1.67 03/01/2016   HGBA1C 6.2 (H) 02/29/2016      Assessment / Plan:      The patient has no evidence of pulmonary infection, clear chest x-ray. He does have cough and nasal congestion. Will treat this symptomatically. He has had a flu shot.  Patient was given a prescription for cough medicine should he need it, though he notes that his cough is somewhat better today than it was yesterday Keep his usual appointment in 2 weeks He will call if his symptoms do not continue to improve or he develops fever or chills      Grace Isaac MD      [address] [place] [address] Office [phone]   Beeper [phone]  03/15/2016 1:23 PM

## 2016-03-18 ENCOUNTER — Encounter: Payer: Self-pay | Admitting: Physician Assistant

## 2016-03-18 ENCOUNTER — Ambulatory Visit (INDEPENDENT_AMBULATORY_CARE_PROVIDER_SITE_OTHER): Payer: Commercial Managed Care - HMO | Admitting: Physician Assistant

## 2016-03-18 VITALS — BP 118/76 | HR 49 | Ht 71.0 in | Wt 240.0 lb

## 2016-03-18 DIAGNOSIS — I25810 Atherosclerosis of coronary artery bypass graft(s) without angina pectoris: Secondary | ICD-10-CM | POA: Diagnosis not present

## 2016-03-18 DIAGNOSIS — I480 Paroxysmal atrial fibrillation: Secondary | ICD-10-CM | POA: Diagnosis not present

## 2016-03-18 DIAGNOSIS — J069 Acute upper respiratory infection, unspecified: Secondary | ICD-10-CM | POA: Diagnosis not present

## 2016-03-18 DIAGNOSIS — I10 Essential (primary) hypertension: Secondary | ICD-10-CM

## 2016-03-18 DIAGNOSIS — E7800 Pure hypercholesterolemia, unspecified: Secondary | ICD-10-CM

## 2016-03-18 DIAGNOSIS — B9789 Other viral agents as the cause of diseases classified elsewhere: Secondary | ICD-10-CM

## 2016-03-18 MED ORDER — METOPROLOL TARTRATE 25 MG PO TABS: 12.5000 mg | ORAL_TABLET | Freq: Every evening | ORAL | 3 refills | Status: DC

## 2016-03-18 NOTE — Progress Notes (Signed)
Cardiology Office Note:    Date:  03/18/2016   ID:  Hayden Rivera, DOB [birth year], MRN [id number]  PCP:  Kenn File, MD  Cardiologist:  Dr. Loralie Champagne   Electrophysiologist:  n/a  Referring MD: Timmothy Euler, MD   Chief Complaint  Patient presents with  . Hospitalization Follow-up    s/p CABG    History of Present Illness:    Hayden Rivera is a 65 y.o. male with a hx of CAD status post prior stenting to the LAD, angioplasty to the LAD in 5/14 secondary to ISR and DES to the LCx (c/b peri-procedure infarct 2/2 distal embolization).  He is not on beta-blocker 2/2 bradycardia.  He was intolerant of Brilinta 2/2 to dyspnea.  Last cardiac catheterization in 5/16 demonstrated patent stents in LAD and LCx. He had moderate nonobstructive disease elsewhere. Medical therapy was recommended.  He was seen last month with exertional angina and LHC demonstrated complex lesion involving the ostial LCx and ostial RI and distal LM.  There was no option for PCI and he was referred to Dr. Servando Snare for consideration of CABG.  He was admitted 10/3-10/9 and underwent CABG with L-LAD, L radial-RI, S-LCx by Dr. Servando Snare. Post op course notable for AFib that converted to NSR on Amiodarone.  He did not require transfusion with PRBCs.  He was diuresed for volume excess.  He returns for FU.  Here with his wife.  He has had sinus congestion and cough since DC.  He saw Dr. Servando Snare and a CXR was clear.  He denies fevers or purulent sputum.  He denies hemoptysis.  His chest is still sore.  He denies significant shortness of breath.  He is walking several times a day without issues.  He denies syncope or near syncope. He denies any bleeding. His appetite is good.   Prior CV studies that were reviewed today include:    Echo 02/29/16  Mild conc LVH, EF 50-55, ant-sept, apical-septal and ant AK, mild AS, mild MR  Carotid US 02/29/16  Bilateral ICA 1-39  LHC 02/17/16 LM dist 67 LAD prox 40 (prox to previous  stent), 30% ISR RI ostial 90 LCx ostial 95 (also involves RI), mid stent patent RCA irregs Conclusion:  Complex lesion involving the ostial circumflex, ostial ramus, and distal left main.  50% distal left main stenosis, 90-95% ostial LCx and ostial ramus stenosis.  Refer for CABG  Myoview 09/17/14 Overall Impression: High risk stress nuclear study with two defects: 1. There is a small scar in the distal anterior wall and in the apex. 2. There is a large scar in the basal and mid inferoseptal, inferior and inferolateral walls with medium size, moderate severity peri-infarct ischemia in the basal and mid inferolateral and basal anterolateral walls.  LV Ejection Fraction: 49%. LV Wall Motion: Paradoxical septal motion, akinesis in the basal inferior and inferoseptal walls, apical anterior walls.   Echo 09/16/14 EF 50-55%, anteroseptal AK, inferior AK, grade 2 diastolic dysfunction, mild aortic stenosis mean gradient 10 mmHg   Past Medical History:  Diagnosis Date  . Anginal pain ([place])    occ  . Arthritis   . CAD (coronary artery disease)    a. Anterior STEMI 2005 c/b vfib arrest, PCI to LAD at [facility]. b. Stent thrombosis 2006 with DES within prior LAD stent at [facility]. C. 09/2012: s/p balloon angioplasty to mLAD for severe stenosis in previously stented segment & DES to LCx; initial enz neg but ruled in for NSTEMI after post-cath vagal sx,  felt d/t distal emboliz of thrombus during case.  . Elevated hemidiaphragm    a. Noted 09/2012 - instructed to f/u PCP.  Marland Kitchen GERD (gastroesophageal reflux disease)   . Heart murmur   . History of kidney stones   . Hypertension   . Ischemic cardiomyopathy    a. Unclear prior EF but pt was told heart was weakened in past. b. EF normal 09/2012.  Marland Kitchen Myocardial infarction   . Shortness of breath dyspnea   . Ventricular fibrillation ([place])    a. VF arrest 2005 in setting of STEMI.  . Wears glasses     Past Surgical History:  Procedure Laterality Date  .  BACK SURGERY    . CARDIAC CATHETERIZATION     stents x2  . CARDIAC CATHETERIZATION N/A 10/02/2014   Procedure: Left Heart Cath And Coronary Angiography;  Surgeon: Larey Dresser, MD;  Location: [facility] INVASIVE CV LAB CUPID;  Service: Cardiovascular;  Laterality: N/A;  . CARDIAC CATHETERIZATION N/A 02/17/2016   Procedure: Left Heart Cath and Coronary Angiography;  Surgeon: Larey Dresser, MD;  Location: [place] CV LAB;  Service: Cardiovascular;  Laterality: N/A;  . CORONARY ARTERY BYPASS GRAFT N/A 03/01/2016   Procedure: CORONARY ARTERY BYPASS GRAFTING (CABG)x3 with endoscopic harvesting of right saphenous vein -LIMA to LAD -SVG to LEFT CIRCUMFLEX -RADIAL ARTERY to RAMUS INTERMEDIA;  Surgeon: Grace Isaac, MD;  Location: [place];  Service: Open Heart Surgery;  Laterality: N/A;  . CORONARY STENT PLACEMENT    . HAND SURGERY Left    hamick bone rem  . KNEE ARTHROSCOPY Right 10/24/2014   Procedure: ARTHROSCOPY RIGHT KNEE, Partial medial menisectomy and chondroplasty;  Surgeon: Melrose Nakayama, MD;  Location: [place];  Service: Orthopedics;  Laterality: Right;  . LEFT HEART CATH N/A 10/11/2012   Procedure: LEFT HEART CATH;  Surgeon: Sherren Mocha, MD;  Location: [facility] CATH LAB;  Service: Cardiovascular;  Laterality: N/A;  . LITHOTRIPSY    . RADIAL ARTERY HARVEST Left 03/01/2016   Procedure: LEFT RADIAL ARTERY HARVEST;  Surgeon: Grace Isaac, MD;  Location: [place];  Service: Open Heart Surgery;  Laterality: Left;  . SPLIT NIGHT STUDY  09/21/2015  . TEE WITHOUT CARDIOVERSION N/A 03/01/2016   Procedure: TRANSESOPHAGEAL ECHOCARDIOGRAM (TEE);  Surgeon: Grace Isaac, MD;  Location: [place];  Service: Open Heart Surgery;  Laterality: N/A;  . TONSILLECTOMY      Current Medications: Current Meds  Medication Sig  . acetaminophen (TYLENOL) 500 MG tablet Take 500 mg by mouth every 6 (six) hours as needed (PAIN).   Marland Kitchen amiodarone (PACERONE) 200 MG tablet Take 200 mg by mouth daily.  Marland Kitchen aspirin 81 MG tablet  Take 1 tablet (81 mg total) by mouth daily.  . Chlorpheniramine-APAP (CORICIDIN) 2-325 MG TABS Take by mouth every 6 (six) hours as needed.  . cholecalciferol (VITAMIN D) 1000 UNITS tablet Take 2,000 Units by mouth daily.   . clopidogrel (PLAVIX) 75 MG tablet Take 75 mg by mouth at bedtime.   . Coenzyme Q10 (COQ10) 100 MG CAPS Take 200 mg by mouth daily.   Marland Kitchen guaiFENesin (MUCINEX) 600 MG 12 hr tablet Take 2 tablets (1,200 mg total) by mouth 2 (two) times daily as needed.  Marland Kitchen HYDROcodone-homatropine (HYCODAN) 5-1.5 MG/5ML syrup Take 5 mLs by mouth every 6 (six) hours as needed for cough.  . isosorbide mononitrate (IMDUR) 30 MG 24 hr tablet Take 0.5 tablets (15 mg total) by mouth daily. For 30 days  . Melatonin 5 MG TABS  Take 1 tablet by mouth at bedtime as needed.  . Omega-3 Fatty Acids (FISH OIL) 1200 MG CAPS Take 1 capsule by mouth 2 (two) times daily.  . pantoprazole (PROTONIX) 40 MG tablet Take 1 tablet (40 mg total) by mouth daily.  . rosuvastatin (CRESTOR) 10 MG tablet Take 10 mg by mouth at bedtime.  . traMADol (ULTRAM) 50 MG tablet Take 1-2 tablets (50-100 mg total) by mouth every 4 (four) hours as needed for moderate pain.  . [DISCONTINUED] amiodarone (PACERONE) 200 MG tablet Take 1 tablet (200 mg total) by mouth 2 (two) times daily. For 7 Days, then decrease to 200 mg daily  . [DISCONTINUED] Chlorphen-Pseudoephed-APAP (CORICIDIN D PO) Take 1 tablet by mouth every 6 (six) hours.  . [DISCONTINUED] metoprolol tartrate (LOPRESSOR) 25 MG tablet Take 12.5 mg by mouth 2 (two) times daily.  . [DISCONTINUED] rosuvastatin (CRESTOR) 10 MG tablet Take 1 tablet (10 mg total) by mouth daily. (Patient taking differently: Take 10 mg by mouth every evening. )   Current Facility-Administered Medications for the 03/18/16 encounter (Office Visit) with Liliane Shi, PA-C  Medication  . HYDROcodone-homatropine (HYCODAN) 5-1.5 MG/5ML syrup 5 mL     Allergies:   Altace [ramipril] and Penicillins   Social  History   Social History  . Marital status: Married    Spouse name: N/A  . Number of children: N/A  . Years of education: N/A   Occupational History  . retired    Social History Main Topics  . Smoking status: Former Smoker    Packs/day: 0.50    Years: 10.00    Types: Cigars, Cigarettes    Quit date: 05/30/2002  . Smokeless tobacco: Never Used  . Alcohol use No  . Drug use: No  . Sexual activity: Not Currently   Other Topics Concern  . None   Social History Narrative  . None     Family History:  The patient's family history includes Healthy in his brother; Heart disease in his mother; Stroke in his father.   ROS:   Please see the history of present illness.    Review of Systems  Respiratory: Positive for cough.    All other systems reviewed and are negative.   EKGs/Labs/Other Test Reviewed:    EKG:  EKG is  ordered today.  The ekg ordered today demonstrates sinus brady, HR 49, LAD, low voltage, NSSTTW changes, QTc 435  Recent Labs: 03/03/2016: ALT 35; TSH 2.208 03/04/2016: Magnesium 2.3 03/05/2016: Hemoglobin 10.2; Platelets 144 03/06/2016: BUN 25; Creatinine, Ser 1.07; Potassium 3.2; Sodium 138   Recent Lipid Panel    Component Value Date/Time   CHOL 136 07/16/2015 0843   TRIG 118 07/16/2015 0843   HDL 40 07/16/2015 0843   CHOLHDL 3.4 07/16/2015 0843   VLDL 24 07/16/2015 0843   LDLCALC 72 07/16/2015 0843     Physical Exam:    VS:  BP 118/76   Pulse (!) 49   Ht 5\' 11"  (1.803 m)   Wt 240 lb (108.9 kg)   BMI 33.47 kg/m     Wt Readings from Last 3 Encounters:  03/18/16 240 lb (108.9 kg)  03/15/16 240 lb (108.9 kg)  03/07/16 251 lb 3.2 oz (113.9 kg)     Physical Exam  Constitutional: He is oriented to person, place, and time. He appears well-developed and well-nourished.  HENT:  Head: Normocephalic and atraumatic.  Mouth/Throat: No oropharyngeal exudate.  Eyes: No scleral icterus.  Neck: No JVD present.  Cardiovascular: Normal rate,  regular  rhythm and normal heart sounds.   No murmur heard. Pulmonary/Chest: Effort normal. He has no wheezes. He has no rales.  Median sternotomy well healed without erythema or d/c  Abdominal: Soft. There is no tenderness.  Musculoskeletal: He exhibits edema.  Trace bilateral edema   Neurological: He is alert and oriented to person, place, and time.  Skin: Skin is warm and dry.  Psychiatric: He has a normal mood and affect.    ASSESSMENT:    1. Coronary artery disease involving coronary bypass graft of native heart without angina pectoris   2. PAF (paroxysmal atrial fibrillation) ([place])   3. Viral upper respiratory tract infection   4. Hypertension, essential   5. Pure hypercholesterolemia    PLAN:    In order of problems listed above:  1. CAD - Hx of Anterior STEMI c/b VF arrest in 2005 with PCI to LAD, DES to LAD in 2006 2/2 stent thrombosis [facility]), angioplasty to LAD in 2014 2/2 ISR and DES to LCx in 2014 for [place].  Recent LHC with complex lesion involving the ostial LCx and ostial RI and distal LM.  He is now s/p CABG on 03/01/16. He did have post op AFib and is now on Amiodarone.  He is progressing well.  He is suffering from URI symptoms but this is improving.  He is interested in cardiac rehabilitation.  -  [facility] will call him soon  -  Continue ASA, Plavix, statin, beta-blocker, nitrates.  -  FU 1 month   2. Parox AF - He had post op AF.  He is in NSR on Amiodarone. His HR is s/w slow. I will decrease Metoprolol to QD for now.  Plan to DC Amiodarone at FU in 1 month and resume Metoprolol bid.    3. URI - Symptoms sound viral.  He is slowly improving.  Continue Coricidin, Mucinex Prn.  He can take Flonase as well.  I have asked him to take Ibuprofen for 2 days to help with his sinus pressure. He knows to call if he has fevers or changes in his symptoms.  4. HTN - Controlled.  5. HL - Continue statin.  Arrange FU Lipids at next OV.  If LDL > 70, consider increasing  Rosuvastatin.   Medication Adjustments/Labs and Tests Ordered: Current medicines are reviewed at length with the patient today.  Concerns regarding medicines are outlined above.  Medication changes, Labs and Tests ordered today are outlined in the Patient Instructions noted below. Patient Instructions  Medication Instructions:  Decrease Metoprolol Tartrate to 25 mg take 1/2 tablet once a day in the evening. It is ok to get Flonase over the counter and use daily for 3-4 weeks. You can take Ibuprofen 400 mg Twice daily for 2 days then stop.  Labwork: None  Testing/Procedures: None   Follow-Up: APPT ON 04/18/16 @ 9:15 WITH Elam Ellis, PAC   Any Other Special Instructions Will Be Listed Below (If Applicable).  If you need a refill on your cardiac medications before your next appointment, please call your pharmacy.   Signed, Richardson Dopp, PA-C  03/18/2016 10:48 AM    [facility] [address], [place], [place]  [address] Phone: [phone]; Fax: [phone]

## 2016-03-18 NOTE — Patient Instructions (Addendum)
Medication Instructions:  Decrease Metoprolol Tartrate to 25 mg take 1/2 tablet once a day in the evening. It is ok to get Flonase over the counter and use daily for 3-4 weeks. You can take Ibuprofen 400 mg Twice daily for 2 days then stop.  Labwork: None  Testing/Procedures: None   Follow-Up: APPT ON 04/18/16 @ 9:15 WITH Kenzie Flakes, PAC   Any Other Special Instructions Will Be Listed Below (If Applicable).  If you need a refill on your cardiac medications before your next appointment, please call your pharmacy.

## 2016-03-21 ENCOUNTER — Telehealth: Payer: Self-pay | Admitting: Cardiology

## 2016-03-21 MED ORDER — ISOSORBIDE MONONITRATE ER 30 MG PO TB24: 30.0000 mg | ORAL_TABLET | Freq: Every day | ORAL | 3 refills | Status: DC

## 2016-03-21 NOTE — Telephone Encounter (Signed)
BPs since Friday:143/81 161/95 160/96 160/ 91 145/87 154/80 146/80 151/99 153/94 until Sunday PM. This AM--  BPs 111/74 149/93 128/68  139/88 Heart rates averaging 49-60

## 2016-03-21 NOTE — Telephone Encounter (Signed)
Increase Imdur to 30 mg QD. Richardson Dopp, PA-C   03/21/2016 1:44 PM

## 2016-03-21 NOTE — Telephone Encounter (Signed)
Hayden Rivera is calling because her husband medication dosage was changed and now his BP is running high. This morning 9:30am 149/93 12:30am this morning111/74 Yesterday evening 151/99 10pm last night 151/99

## 2016-03-21 NOTE — Telephone Encounter (Signed)
Pt's wife advised to increase Imdur to 30mg  daily, continue to take and record HR and BP.

## 2016-03-21 NOTE — Telephone Encounter (Signed)
Pt's wife states pt saw Richardson Dopp, [place] 03/18/16 and metoprolol was decreased to 12.5mg  daily in the evening instead of bid.  Pt's wife pt denies any symptoms, is still improving from respiratory illness. Pt's wife advised I will forward to Richardson Dopp, [place] for review.

## 2016-04-06 ENCOUNTER — Other Ambulatory Visit: Payer: Self-pay | Admitting: Cardiothoracic Surgery

## 2016-04-06 DIAGNOSIS — Z951 Presence of aortocoronary bypass graft: Secondary | ICD-10-CM

## 2016-04-07 ENCOUNTER — Ambulatory Visit (INDEPENDENT_AMBULATORY_CARE_PROVIDER_SITE_OTHER): Payer: Self-pay | Admitting: Cardiothoracic Surgery

## 2016-04-07 ENCOUNTER — Ambulatory Visit
Admission: RE | Admit: 2016-04-07 | Discharge: 2016-04-07 | Disposition: A | Discharge status: Home / Self Care | Payer: Commercial Managed Care - HMO | Source: Ambulatory Visit | Attending: Cardiothoracic Surgery | Admitting: Cardiothoracic Surgery

## 2016-04-07 ENCOUNTER — Encounter: Payer: Self-pay | Admitting: Cardiothoracic Surgery

## 2016-04-07 VITALS — BP 121/76 | HR 58 | Temp 97.1°F | Resp 16 | Ht 71.0 in | Wt 235.0 lb

## 2016-04-07 DIAGNOSIS — I2510 Atherosclerotic heart disease of native coronary artery without angina pectoris: Secondary | ICD-10-CM

## 2016-04-07 DIAGNOSIS — Z951 Presence of aortocoronary bypass graft: Secondary | ICD-10-CM

## 2016-04-07 DIAGNOSIS — J90 Pleural effusion, not elsewhere classified: Secondary | ICD-10-CM | POA: Diagnosis not present

## 2016-04-07 DIAGNOSIS — R05 Cough: Secondary | ICD-10-CM

## 2016-04-07 DIAGNOSIS — R059 Cough, unspecified: Secondary | ICD-10-CM

## 2016-04-07 MED ORDER — CIPROFLOXACIN HCL 500 MG PO TABS: 500.0000 mg | ORAL_TABLET | Freq: Two times a day (BID) | ORAL | 0 refills | Status: DC

## 2016-04-07 MED ORDER — HYDROCODONE-HOMATROPINE 5-1.5 MG/5ML PO SYRP: 5.0000 mL | ORAL_SOLUTION | Freq: Four times a day (QID) | ORAL | 0 refills | Status: DC | PRN

## 2016-04-07 NOTE — Progress Notes (Signed)
[address]       [place] [address]             [phone]      Hayden Rivera [place] Medical Record [id number] Date of Birth: 06-28-1950  Referring: Larey Dresser, MD Primary Care: Kenn File, MD  Chief Complaint:   POST OP FOLLOW UP 03/01/2016  OPERATIVE REPORT PREOPERATIVE DIAGNOSIS:  Coronary occlusive disease with angina. POSTOPERATIVE DIAGNOSIS:  Coronary occlusive disease with angina. SURGICAL PROCEDURE:  Coronary artery bypass grafting x3 with the left internal mammary to the left anterior descending coronary artery, left radial bypass to the intermediate coronary artery, and reverse saphenous vein graft to the circumflex coronary artery with right thigh greater saphenous vein harvesting endoscopically. SURGEON:  Lanelle Bal, MD.   History of Present Illness:     Patient returns to the office today, still with complaint of cough nonproductive, improved some with cough syrup. The cough is raspy denies any specific sputum production. He has had some chills no fever, occasionally sweaty., With night sweats.   Past Medical History:  Diagnosis Date  . Anginal pain ([place])    occ  . Arthritis   . CAD (coronary artery disease)    a. Anterior STEMI 2005 c/b vfib arrest, PCI to LAD at [facility]. b. Stent thrombosis 2006 with DES within prior LAD stent at [facility]. C. 09/2012: s/p balloon angioplasty to mLAD for severe stenosis in previously stented segment & DES to LCx; initial enz neg but ruled in for NSTEMI after post-cath vagal sx, felt d/t distal emboliz of thrombus during case.  . Elevated hemidiaphragm    a. Noted 09/2012 - instructed to f/u PCP.  Marland Kitchen GERD (gastroesophageal reflux disease)   . Heart murmur   . History of kidney stones   . Hypertension   . Ischemic cardiomyopathy    a. Unclear prior EF but pt was told heart was weakened in past. b. EF normal 09/2012.  Marland Kitchen Myocardial infarction   . Shortness of breath dyspnea   . Ventricular  fibrillation ([place])    a. VF arrest 2005 in setting of STEMI.  . Wears glasses      History  Smoking Status  . Former Smoker  . Packs/day: 0.50  . Years: 10.00  . Types: Cigars, Cigarettes  . Quit date: 05/30/2002  Smokeless Tobacco  . Never Used    History  Alcohol Use No     Allergies  Allergen Reactions  . Altace [Ramipril] Shortness Of Breath and Other (See Comments)    cough  . Penicillins Swelling    Has patient had a PCN reaction causing immediate rash, facial/tongue/throat swelling, SOB or lightheadedness with hypotension:unsure Has patient had a PCN reaction causing severe rash involving mucus membranes or skin necrosis:unsure Has patient had a PCN reaction that required hospitalization:No Has patient had a PCN reaction occurring within the last 10 years:NO If all of the above answers are "NO", then may proceed with Cephalosporin use.  Childhood reaction      Current Outpatient Prescriptions  Medication Sig Dispense Refill  . acetaminophen (TYLENOL) 500 MG tablet Take 500 mg by mouth every 6 (six) hours as needed (PAIN).     Marland Kitchen amiodarone (PACERONE) 200 MG tablet Take 200 mg by mouth daily.    . Chlorpheniramine-APAP (CORICIDIN) 2-325 MG TABS Take by mouth every 6 (six) hours as needed.    . cholecalciferol (VITAMIN D) 1000 UNITS tablet Take 2,000 Units by mouth daily.     Marland Kitchen  clopidogrel (PLAVIX) 75 MG tablet Take 75 mg by mouth at bedtime.     . Coenzyme Q10 (COQ10) 100 MG CAPS Take 200 mg by mouth daily.     Marland Kitchen guaiFENesin (MUCINEX) 600 MG 12 hr tablet Take 2 tablets (1,200 mg total) by mouth 2 (two) times daily as needed.    Marland Kitchen HYDROcodone-homatropine (HYCODAN) 5-1.5 MG/5ML syrup Take 5 mLs by mouth every 6 (six) hours as needed for cough. 120 mL 0  . isosorbide mononitrate (IMDUR) 30 MG 24 hr tablet Take 1 tablet (30 mg total) by mouth daily. 90 tablet 3  . Melatonin 5 MG TABS Take 1 tablet by mouth at bedtime as needed.    . metoprolol tartrate (LOPRESSOR) 25  MG tablet Take 0.5 tablets (12.5 mg total) by mouth every evening. 90 tablet 3  . Omega-3 Fatty Acids (FISH OIL) 1200 MG CAPS Take 1 capsule by mouth 2 (two) times daily.    . pantoprazole (PROTONIX) 40 MG tablet Take 1 tablet (40 mg total) by mouth daily. 30 tablet 6  . rosuvastatin (CRESTOR) 10 MG tablet Take 10 mg by mouth at bedtime.    . traMADol (ULTRAM) 50 MG tablet Take 1-2 tablets (50-100 mg total) by mouth every 4 (four) hours as needed for moderate pain. 30 tablet 0  . aspirin 81 MG tablet Take 1 tablet (81 mg total) by mouth daily.     No current facility-administered medications for this visit.        Physical Exam: BP 121/76 (BP Location: Right Arm, Cuff Size: Large)   Pulse (!) 58   Temp 97.1 F (36.2 C) (Oral)   Resp 16   Ht 5\' 11"  (1.803 m)   Wt 235 lb (106.6 kg)   SpO2 93% Comment: RA  BMI 32.78 kg/m   General appearance: alert and cooperative Neurologic: intact Heart: regular rate and rhythm, S1, S2 normal, no murmur, click, rub or gallop Lungs: clear to auscultation bilaterally, no wheezing  Abdomen: soft, non-tender; bowel sounds normal; no masses,  no organomegaly Extremities: extremities normal, atraumatic, no cyanosis or edema and Homans sign is negative, no sign of DVT Wound: Sternum is stable and well-healed left radial harvest sites also well-healed with neurovascular intact left hand. Right thigh Endo vein harvest site is bruised but without evidence of infection   Diagnostic Studies & Laboratory data:     Recent Radiology Findings:   Dg Chest 2 View  Result Date: 04/07/2016 CLINICAL DATA:  CABG EXAM: CHEST  2 VIEW COMPARISON:  03/15/2016 FINDINGS: Elevated right hemidiaphragm with mild right lower lobe atelectasis which is improved. Small left pleural effusion unchanged. Negative for heart failure or edema. Negative for pneumonia. IMPRESSION: Right lower lobe atelectasis shows mild interval improvement Small left effusion unchanged. Electronically  Signed   By: Franchot Gallo M.D.   On: 04/07/2016 12:41      Recent Lab Findings: Lab Results  Component Value Date   WBC 14.7 (H) 03/05/2016   HGB 10.2 (L) 03/05/2016   HCT 31.7 (L) 03/05/2016   PLT 144 (L) 03/05/2016   GLUCOSE 106 (H) 03/06/2016   CHOL 136 07/16/2015   TRIG 118 07/16/2015   HDL 40 07/16/2015   LDLCALC 72 07/16/2015   ALT 35 03/03/2016   AST 38 03/03/2016   NA 138 03/06/2016   K 3.2 (L) 03/06/2016   CL 104 03/06/2016   CREATININE 1.07 03/06/2016   BUN 25 (H) 03/06/2016   CO2 26 03/06/2016   TSH 2.208 03/03/2016  INR 1.67 03/01/2016   HGBA1C 6.2 (H) 02/29/2016      Assessment / Plan:     Patient has symptoms of bronchitis that have been persistent but without definite pulmonary infiltrates or effusions on chest x-ray. The patient is allergic to penicillin I've given him a short course of ciprofloxacin and codeine-based cough medicine. He is on low-dose Pacerone, we have chosen to use Cipro because of his severe penicillin allergies. Plan to see him back 3 weeks if his symptoms have not improved over the next several days he will call the office back.    Grace Isaac MD      [address] [place] [address] Office [phone]   Beeper [phone]  04/07/2016 1:22 PM

## 2016-04-18 ENCOUNTER — Encounter: Payer: Self-pay | Admitting: Physician Assistant

## 2016-04-18 ENCOUNTER — Ambulatory Visit (INDEPENDENT_AMBULATORY_CARE_PROVIDER_SITE_OTHER): Payer: Commercial Managed Care - HMO | Admitting: Physician Assistant

## 2016-04-18 VITALS — BP 128/70 | HR 56 | Ht 71.0 in | Wt 241.0 lb

## 2016-04-18 DIAGNOSIS — I10 Essential (primary) hypertension: Secondary | ICD-10-CM | POA: Diagnosis not present

## 2016-04-18 DIAGNOSIS — E7800 Pure hypercholesterolemia, unspecified: Secondary | ICD-10-CM

## 2016-04-18 DIAGNOSIS — R059 Cough, unspecified: Secondary | ICD-10-CM

## 2016-04-18 DIAGNOSIS — I480 Paroxysmal atrial fibrillation: Secondary | ICD-10-CM | POA: Diagnosis not present

## 2016-04-18 DIAGNOSIS — R251 Tremor, unspecified: Secondary | ICD-10-CM

## 2016-04-18 DIAGNOSIS — R05 Cough: Secondary | ICD-10-CM

## 2016-04-18 DIAGNOSIS — I25810 Atherosclerosis of coronary artery bypass graft(s) without angina pectoris: Secondary | ICD-10-CM

## 2016-04-18 MED ORDER — BENZONATATE 100 MG PO CAPS: 100.0000 mg | ORAL_CAPSULE | Freq: Three times a day (TID) | ORAL | 1 refills | Status: DC | PRN

## 2016-04-18 MED ORDER — METOPROLOL SUCCINATE ER 25 MG PO TB24: 12.5000 mg | ORAL_TABLET | Freq: Every day | ORAL | 3 refills | Status: DC

## 2016-04-18 NOTE — Progress Notes (Signed)
Cardiology Office Note:    Date:  04/18/2016   ID:  Hayden Rivera, DOB 05-24-1951, MRN [id number]  PCP:  Kenn File, MD  Cardiologist:  Dr. Loralie Champagne   Electrophysiologist:  n/a  Referring MD: Timmothy Euler, MD   Chief Complaint  Patient presents with  . Follow-up    CAD    History of Present Illness:    Hayden Rivera is a 65 y.o. male with a hx of CAD status post prior stenting to the LAD, angioplasty to the LAD in 5/14 secondary to ISR and DES to the LCx (c/b peri-procedure infarct 2/2 distal embolization). He is not on beta-blocker 2/2 bradycardia. He was intolerant of Brilinta 2/2 to dyspnea. Cardiac catheterization in 5/16 demonstrated patent stents in LAD and LCx. He had moderate nonobstructive disease elsewhere. Medical therapy was recommended.  He then developed exertional angina in 9/17 and LHC demonstrated a complex lesion involving the ostial LCx and ostial RI and distal LM.  There was no option for PCI and he underwent CABG with Dr. Servando Snare (L-LAD, L radial-RI, S-LCx) in 10/17.   He had post-op AFib that converted to NSR on Amiodarone.    Last seen last month for follow up.  I reduced his beta-blocker 2/2 bradycardia.  He saw Dr. Servando Snare earlier this month with continued symptoms of cough. Chest x-ray demonstrated no infiltrate or effusion. He was placed on ciprofloxacin and codeine based cough medicine. He returns for FU.   He is here today with his wife. He continues to have significant cough. He denies fevers. His sputum production is clear to white. He denies hemoptysis or purulent sputum. He denies significant dyspnea. He denies orthopnea, PND or edema. He denies syncope. He does get dizzy sometimes when he coughs. Antibiotics did not seem to help. Symptoms seem to be worse at night. He notes tinnitus in his right ear. He also notes difficulty with balance since his surgery. He also notes a left arm tremor.     Prior CV studies that were reviewed today  include:    Echo 02/29/16  Mild conc LVH, EF 50-55, ant-sept, apical-septal and ant AK, mild AS, mild MR  Carotid US 02/29/16  Bilateral ICA 1-39  LHC 02/17/16 LM dist 22 LAD prox 40 (prox to previous stent), 30% ISR RI ostial 90 LCx ostial 95 (also involves RI), mid stent patent RCA irregs Conclusion:  Complex lesion involving the ostial circumflex, ostial ramus, and distal left main.  50% distal left main stenosis, 90-95% ostial LCx and ostial ramus stenosis.  Refer for CABG  Myoview 09/17/14 Overall Impression: High risk stress nuclear study with two defects: 1. There is a small scar in the distal anterior wall and in the apex. 2. There is a large scar in the basal and mid inferoseptal, inferior and inferolateral walls with medium size, moderate severity peri-infarct ischemia in the basal and mid inferolateral and basal anterolateral walls.  LV Ejection Fraction: 49%. LV Wall Motion: Paradoxical septal motion, akinesis in the basal inferior and inferoseptal walls, apical anterior walls.   Echo 09/16/14 EF 50-55%, anteroseptal AK, inferior AK, grade 2 diastolic dysfunction, mild aortic stenosis mean gradient 10 mmHg  Past Medical History:  Diagnosis Date  . Anginal pain ([place])    occ  . Arthritis   . CAD (coronary artery disease)    a. Anterior STEMI 2005 c/b vfib arrest, PCI to LAD at [facility]. b. Stent thrombosis 2006 with DES within prior LAD stent at [facility]. C. 09/2012: s/p balloon  angioplasty to mLAD for severe stenosis in previously stented segment & DES to LCx; initial enz neg but ruled in for NSTEMI after post-cath vagal sx, felt d/t distal emboliz of thrombus during case.  . Elevated hemidiaphragm    a. Noted 09/2012 - instructed to f/u PCP.  Marland Kitchen GERD (gastroesophageal reflux disease)   . Heart murmur   . History of kidney stones   . Hypertension   . Ischemic cardiomyopathy    a. Unclear prior EF but pt was told heart was weakened in past. b. EF normal 09/2012.  Marland Kitchen  Myocardial infarction   . Shortness of breath dyspnea   . Ventricular fibrillation ([place])    a. VF arrest 2005 in setting of STEMI.  . Wears glasses     Past Surgical History:  Procedure Laterality Date  . BACK SURGERY    . CARDIAC CATHETERIZATION     stents x2  . CARDIAC CATHETERIZATION N/A 10/02/2014   Procedure: Left Heart Cath And Coronary Angiography;  Surgeon: Larey Dresser, MD;  Location: [facility] INVASIVE CV LAB CUPID;  Service: Cardiovascular;  Laterality: N/A;  . CARDIAC CATHETERIZATION N/A 02/17/2016   Procedure: Left Heart Cath and Coronary Angiography;  Surgeon: Larey Dresser, MD;  Location: [place] CV LAB;  Service: Cardiovascular;  Laterality: N/A;  . CORONARY ARTERY BYPASS GRAFT N/A 03/01/2016   Procedure: CORONARY ARTERY BYPASS GRAFTING (CABG)x3 with endoscopic harvesting of right saphenous vein -LIMA to LAD -SVG to LEFT CIRCUMFLEX -RADIAL ARTERY to RAMUS INTERMEDIA;  Surgeon: Grace Isaac, MD;  Location: [place];  Service: Open Heart Surgery;  Laterality: N/A;  . CORONARY STENT PLACEMENT    . HAND SURGERY Left    hamick bone rem  . KNEE ARTHROSCOPY Right 10/24/2014   Procedure: ARTHROSCOPY RIGHT KNEE, Partial medial menisectomy and chondroplasty;  Surgeon: Melrose Nakayama, MD;  Location: [place];  Service: Orthopedics;  Laterality: Right;  . LEFT HEART CATH N/A 10/11/2012   Procedure: LEFT HEART CATH;  Surgeon: Sherren Mocha, MD;  Location: [facility] CATH LAB;  Service: Cardiovascular;  Laterality: N/A;  . LITHOTRIPSY    . RADIAL ARTERY HARVEST Left 03/01/2016   Procedure: LEFT RADIAL ARTERY HARVEST;  Surgeon: Grace Isaac, MD;  Location: [place];  Service: Open Heart Surgery;  Laterality: Left;  . SPLIT NIGHT STUDY  09/21/2015  . TEE WITHOUT CARDIOVERSION N/A 03/01/2016   Procedure: TRANSESOPHAGEAL ECHOCARDIOGRAM (TEE);  Surgeon: Grace Isaac, MD;  Location: [place];  Service: Open Heart Surgery;  Laterality: N/A;  . TONSILLECTOMY      Current Medications: Current Meds    Medication Sig  . acetaminophen (TYLENOL) 500 MG tablet Take 500 mg by mouth every 6 (six) hours as needed (PAIN).   Marland Kitchen aspirin 81 MG tablet Take 1 tablet (81 mg total) by mouth daily.  . Chlorpheniramine-APAP (CORICIDIN) 2-325 MG TABS Take by mouth every 6 (six) hours as needed.  . cholecalciferol (VITAMIN D) 1000 UNITS tablet Take 2,000 Units by mouth daily.   . clopidogrel (PLAVIX) 75 MG tablet Take 75 mg by mouth at bedtime.   . Coenzyme Q10 (COQ10) 100 MG CAPS Take 200 mg by mouth daily.   Marland Kitchen guaiFENesin (MUCINEX) 600 MG 12 hr tablet Take 2 tablets (1,200 mg total) by mouth 2 (two) times daily as needed.  Marland Kitchen HYDROcodone-homatropine (HYCODAN) 5-1.5 MG/5ML syrup Take 5 mLs by mouth every 6 (six) hours as needed for cough.  . isosorbide mononitrate (IMDUR) 30 MG 24 hr tablet Take 1 tablet (30  mg total) by mouth daily.  . Melatonin 5 MG TABS Take 1 tablet by mouth at bedtime as needed.  . Omega-3 Fatty Acids (FISH OIL) 1200 MG CAPS Take 1 capsule by mouth 2 (two) times daily.  . pantoprazole (PROTONIX) 40 MG tablet Take 1 tablet (40 mg total) by mouth daily.  . rosuvastatin (CRESTOR) 10 MG tablet Take 10 mg by mouth at bedtime.  . [DISCONTINUED] amiodarone (PACERONE) 200 MG tablet Take 200 mg by mouth daily.  . [DISCONTINUED] metoprolol tartrate (LOPRESSOR) 25 MG tablet Take 0.5 tablets (12.5 mg total) by mouth every evening.     Allergies:   Altace [ramipril] and Penicillins   Social History   Social History  . Marital status: Married    Spouse name: N/A  . Number of children: N/A  . Years of education: N/A   Occupational History  . retired    Social History Main Topics  . Smoking status: Former Smoker    Packs/day: 0.50    Years: 10.00    Types: Cigars, Cigarettes    Quit date: 05/30/2002  . Smokeless tobacco: Never Used  . Alcohol use No  . Drug use: No  . Sexual activity: Not Currently   Other Topics Concern  . None   Social History Narrative  . None     Family  History:  The patient's family history includes Healthy in his brother; Heart disease in his mother; Stroke in his father.   ROS:   Please see the history of present illness.    Review of Systems  Constitution: Negative for chills and fever.  Respiratory: Negative for hemoptysis and wheezing.   Hematologic/Lymphatic: Negative for bleeding problem.   All other systems reviewed and are negative.   EKGs/Labs/Other Test Reviewed:    EKG:  EKG is  ordered today.  The ekg ordered today demonstrates Sinus bradycardia, HR 56, left axis deviation, no change from prior tracing  Recent Labs: 03/03/2016: ALT 35; TSH 2.208 03/04/2016: Magnesium 2.3 03/05/2016: Hemoglobin 10.2; Platelets 144 03/06/2016: BUN 25; Creatinine, Ser 1.07; Potassium 3.2; Sodium 138   Recent Lipid Panel    Component Value Date/Time   CHOL 136 07/16/2015 0843   TRIG 118 07/16/2015 0843   HDL 40 07/16/2015 0843   CHOLHDL 3.4 07/16/2015 0843   VLDL 24 07/16/2015 0843   LDLCALC 72 07/16/2015 0843     Physical Exam:    VS:  BP 128/70   Pulse (!) 56   Ht 5\' 11"  (1.803 m)   Wt 241 lb (109.3 kg)   BMI 33.61 kg/m     Wt Readings from Last 3 Encounters:  04/18/16 241 lb (109.3 kg)  04/07/16 235 lb (106.6 kg)  03/18/16 240 lb (108.9 kg)     Physical Exam  Constitutional: He is oriented to person, place, and time. He appears well-developed and well-nourished.  HENT:  Head: Normocephalic and atraumatic.  Right Ear: Tympanic membrane normal.  Left Ear: Tympanic membrane normal.  Mouth/Throat: Oropharynx is clear and moist.  Eyes: No scleral icterus.  Neck: No JVD present.  Cardiovascular: Normal rate and regular rhythm.   Murmur heard.  Low-pitched systolic murmur is present with a grade of 1/6  at the lower left sternal border Pulmonary/Chest: Effort normal. He has no wheezes. He has no rales.  Abdominal: There is no tenderness.  Neurological: He is alert and oriented to person, place, and time.  R arm  intention tremor noted  Skin: Skin is warm and dry.  Psychiatric:  He has a normal mood and affect.    ASSESSMENT:    1. Coronary artery disease involving coronary bypass graft of native heart without angina pectoris   2. PAF (paroxysmal atrial fibrillation) ([place])   3. Hypertension, essential   4. Pure hypercholesterolemia   5. Cough   6. Tremor    PLAN:    In order of problems listed above:  1. CAD - Hx of anterior STEMI c/b VF arrest in 2005 tx with PCI to LAD, DES to LAD in 2006 and angioplasty to LAD and DES to LCx in 2014.  He is now s/p CABG in 10/17.  He is recovering well.  He has not started cardiac rehabilitation yet.  He may join a gym near home instead.  He is curious when he can drive and use his riding mower.  I asked him to check with Dr. Servando Snare on this.    -  Continue aspirin, Plavix, nitrates, beta blocker, statin.   -  Change metoprolol tartrate to Toprol-XL 12.5 mg daily.  2. Post op AF - Maintaining sinus rhythm.   -  DC Amiodarone.   3. HTN - BP controlled.   4. HL - Continue statin.   5. Cough - Persistent. Tx with Cipro recently.  Etiology not clear.    -  Will give Tessalon perles.    -  He can use Zyrtec, Flonase, Mucinex.    -  I will refer to Pulmonology.    6. Tremor - May be related to Amiodarone.    -  DC Amio as noted.    -  If continues, refer to Neuro.  Dispo - I have asked him to establish with a PCP.   Medication Adjustments/Labs and Tests Ordered: Current medicines are reviewed at length with the patient today.  Concerns regarding medicines are outlined above.  Medication changes, Labs and Tests ordered today are outlined in the Patient Instructions noted below. Patient Instructions  Medication Instructions:  STOP Amiodarone STOP Metoprolol Tartrate  START Toprol-XL 25 mg take 1/2 tablet Once daily   For your cough: Tessalon Perles 100 mg Three times a day as need for cough - Prescription sent to your pharmacy Zyrtec 10 mg Once  daily - get over the counter Flonase - 2 sprays each nostril Once daily - get over the counter Mucinex - plain Mucinex (no decongestant or cough suppressant) - get over the counter  Labwork: None   Testing/Procedures: None   Follow-Up: Richardson Dopp, PA-C or Dr. Harrell Gave End in 3 months.  Any Other Special Instructions Will Be Listed Below (If Applicable). Please establish with a primary care doctor. I am referring you to Pulmonology (lung doctor) to further evaluate your cough. Call if you balance and tremor do not improve after stopping Amiodarone (in the next 1 month).  If you need a refill on your cardiac medications before your next appointment, please call your pharmacy.   Signed, Richardson Dopp, PA-C  04/18/2016 5:29 PM    [facility] [address], [place], [place]  [address] Phone: [phone]; Fax: [phone]

## 2016-04-18 NOTE — Patient Instructions (Signed)
Medication Instructions:  STOP Amiodarone STOP Metoprolol Tartrate  START Toprol-XL 25 mg take 1/2 tablet Once daily   For your cough: Tessalon Perles 100 mg Three times a day as need for cough - Prescription sent to your pharmacy Zyrtec 10 mg Once daily - get over the counter Flonase - 2 sprays each nostril Once daily - get over the counter Mucinex - plain Mucinex (no decongestant or cough suppressant) - get over the counter  Labwork: None   Testing/Procedures: None   Follow-Up: Richardson Dopp, PA-C or Dr. Harrell Gave End in 3 months.  Any Other Special Instructions Will Be Listed Below (If Applicable). Please establish with a primary care doctor. I am referring you to Pulmonology (lung doctor) to further evaluate your cough. Call if you balance and tremor do not improve after stopping Amiodarone (in the next 1 month).  If you need a refill on your cardiac medications before your next appointment, please call your pharmacy.

## 2016-04-19 NOTE — Progress Notes (Signed)
He can see Dr. Saunders Revel.

## 2016-05-04 ENCOUNTER — Other Ambulatory Visit: Payer: Self-pay | Admitting: Cardiothoracic Surgery

## 2016-05-04 DIAGNOSIS — Z951 Presence of aortocoronary bypass graft: Secondary | ICD-10-CM

## 2016-05-05 ENCOUNTER — Ambulatory Visit (INDEPENDENT_AMBULATORY_CARE_PROVIDER_SITE_OTHER): Payer: Self-pay | Admitting: Cardiothoracic Surgery

## 2016-05-05 ENCOUNTER — Encounter: Payer: Self-pay | Admitting: Cardiothoracic Surgery

## 2016-05-05 ENCOUNTER — Ambulatory Visit
Admission: RE | Admit: 2016-05-05 | Discharge: 2016-05-05 | Disposition: A | Discharge status: Home / Self Care | Payer: Commercial Managed Care - HMO | Source: Ambulatory Visit | Attending: Cardiothoracic Surgery | Admitting: Cardiothoracic Surgery

## 2016-05-05 VITALS — BP 130/84 | HR 60 | Resp 20 | Ht 71.0 in | Wt 241.0 lb

## 2016-05-05 DIAGNOSIS — Z951 Presence of aortocoronary bypass graft: Secondary | ICD-10-CM

## 2016-05-05 DIAGNOSIS — I2510 Atherosclerotic heart disease of native coronary artery without angina pectoris: Secondary | ICD-10-CM

## 2016-05-05 DIAGNOSIS — R05 Cough: Secondary | ICD-10-CM | POA: Diagnosis not present

## 2016-05-05 NOTE — Progress Notes (Signed)
[address]       [place] [address]             [phone]      Nevan Meenach [place] Medical Record [id number] Date of Birth: 23-Nov-1950  Referring: Larey Dresser, MD Primary Care: Kenn File, MD  Chief Complaint:   POST OP FOLLOW UP 03/01/2016  OPERATIVE REPORT PREOPERATIVE DIAGNOSIS:  Coronary occlusive disease with angina. POSTOPERATIVE DIAGNOSIS:  Coronary occlusive disease with angina. SURGICAL PROCEDURE:  Coronary artery bypass grafting x3 with the left internal mammary to the left anterior descending coronary artery, left radial bypass to the intermediate coronary artery, and reverse saphenous vein graft to the circumflex coronary artery with right thigh greater saphenous vein harvesting endoscopically. SURGEON:  Lanelle Bal, MD.   History of Present Illness:     Patient returns to the office today, still with complaint of cough nonproductive, improved some with cough syrup. He continues to have a cough and nasal drainage, he notes the cough is primarily during the day. He was given a course of by mouth antibiotics but notes that this really did not change symptoms. He returns to the office today with a follow-up chest x-ray. On exam today he has noticeable nasal drainage, and thinks that this is aggravating his cough.   Past Medical History:  Diagnosis Date  . Anginal pain ([place])    occ  . Arthritis   . CAD (coronary artery disease)    a. Anterior STEMI 2005 c/b vfib arrest, PCI to LAD at [facility]. b. Stent thrombosis 2006 with DES within prior LAD stent at [facility]. C. 09/2012: s/p balloon angioplasty to mLAD for severe stenosis in previously stented segment & DES to LCx; initial enz neg but ruled in for NSTEMI after post-cath vagal sx, felt d/t distal emboliz of thrombus during case.  . Elevated hemidiaphragm    a. Noted 09/2012 - instructed to f/u PCP.  Marland Kitchen GERD (gastroesophageal reflux disease)   . Heart murmur   . History of kidney  stones   . Hypertension   . Ischemic cardiomyopathy    a. Unclear prior EF but pt was told heart was weakened in past. b. EF normal 09/2012.  Marland Kitchen Myocardial infarction   . Shortness of breath dyspnea   . Ventricular fibrillation ([place])    a. VF arrest 2005 in setting of STEMI.  . Wears glasses      History  Smoking Status  . Former Smoker  . Packs/day: 0.50  . Years: 10.00  . Types: Cigars, Cigarettes  . Quit date: 05/30/2002  Smokeless Tobacco  . Never Used    History  Alcohol Use No     Allergies  Allergen Reactions  . Altace [Ramipril] Shortness Of Breath and Other (See Comments)    cough  . Penicillins Swelling    Has patient had a PCN reaction causing immediate rash, facial/tongue/throat swelling, SOB or lightheadedness with hypotension:unsure Has patient had a PCN reaction causing severe rash involving mucus membranes or skin necrosis:unsure Has patient had a PCN reaction that required hospitalization:No Has patient had a PCN reaction occurring within the last 10 years:NO If all of the above answers are "NO", then may proceed with Cephalosporin use.  Childhood reaction      Current Outpatient Prescriptions  Medication Sig Dispense Refill  . acetaminophen (TYLENOL) 500 MG tablet Take 500 mg by mouth every 6 (six) hours as needed (PAIN).     Marland Kitchen aspirin 81 MG tablet  Take 1 tablet (81 mg total) by mouth daily.    . benzonatate (TESSALON) 100 MG capsule Take 1 capsule (100 mg total) by mouth 3 (three) times daily as needed for cough. 30 capsule 1  . Chlorpheniramine-APAP (CORICIDIN) 2-325 MG TABS Take by mouth every 6 (six) hours as needed.    . cholecalciferol (VITAMIN D) 1000 UNITS tablet Take 2,000 Units by mouth daily.     . clopidogrel (PLAVIX) 75 MG tablet Take 75 mg by mouth at bedtime.     . Coenzyme Q10 (COQ10) 100 MG CAPS Take 200 mg by mouth daily.     Marland Kitchen guaiFENesin (MUCINEX) 600 MG 12 hr tablet Take 2 tablets (1,200 mg total) by mouth 2 (two) times daily as  needed.    Marland Kitchen HYDROcodone-homatropine (HYCODAN) 5-1.5 MG/5ML syrup Take 5 mLs by mouth every 6 (six) hours as needed for cough. 120 mL 0  . isosorbide mononitrate (IMDUR) 30 MG 24 hr tablet Take 1 tablet (30 mg total) by mouth daily. 90 tablet 3  . Melatonin 5 MG TABS Take 1 tablet by mouth at bedtime as needed.    . metoprolol succinate (TOPROL-XL) 25 MG 24 hr tablet Take 0.5 tablets (12.5 mg total) by mouth daily. 90 tablet 3  . Omega-3 Fatty Acids (FISH OIL) 1200 MG CAPS Take 1 capsule by mouth 2 (two) times daily.    . pantoprazole (PROTONIX) 40 MG tablet Take 1 tablet (40 mg total) by mouth daily. 30 tablet 6  . rosuvastatin (CRESTOR) 10 MG tablet Take 10 mg by mouth at bedtime.     No current facility-administered medications for this visit.        Physical Exam: BP 130/84   Pulse 60   Resp 20   Ht 5\' 11"  (1.803 m)   Wt 241 lb (109.3 kg)   SpO2 97% Comment: RA  BMI 33.61 kg/m   General appearance: alert and cooperative Neurologic: intact Heart: regular rate and rhythm, S1, S2 normal, no murmur, click, rub or gallop Lungs: clear to auscultation bilaterally, no wheezing  Abdomen: soft, non-tender; bowel sounds normal; no masses,  no organomegaly Extremities: extremities normal, atraumatic, no cyanosis or edema and Homans sign is negative, no sign of DVT Wound: Sternum is stable and well-healed left radial harvest sites also well-healed with neurovascular intact left hand. Right thigh Endo vein harvest site is bruised but without evidence of infection   Diagnostic Studies & Laboratory data:     Recent Radiology Findings:   Dg Chest 2 View  Result Date: 05/05/2016 CLINICAL DATA:  Two months of productive cough and chest tenderness. History of CABG on March 01, 2016. EXAM: CHEST  2 VIEW COMPARISON:  Chest x-ray of April 07, 2016 FINDINGS: The lungs are reasonably well inflated. There is no focal infiltrate. There is no pleural effusion or pneumothorax. The heart and  pulmonary vascularity are normal. The mediastinum is normal in width. The sternal wires are intact. The retrosternal soft tissues are normal. The bony thorax exhibits no acute abnormality. IMPRESSION: There is no active cardiopulmonary disease. Interval resolution of the small left pleural effusion. Electronically Signed   By: David  Martinique M.D.   On: 05/05/2016 10:49      Recent Lab Findings: Lab Results  Component Value Date   WBC 14.7 (H) 03/05/2016   HGB 10.2 (L) 03/05/2016   HCT 31.7 (L) 03/05/2016   PLT 144 (L) 03/05/2016   GLUCOSE 106 (H) 03/06/2016   CHOL 136 07/16/2015   TRIG  118 07/16/2015   HDL 40 07/16/2015   LDLCALC 72 07/16/2015   ALT 35 03/03/2016   AST 38 03/03/2016   NA 138 03/06/2016   K 3.2 (L) 03/06/2016   CL 104 03/06/2016   CREATININE 1.07 03/06/2016   BUN 25 (H) 03/06/2016   CO2 26 03/06/2016   TSH 2.208 03/03/2016   INR 1.67 03/01/2016   HGBA1C 6.2 (H) 02/29/2016      Assessment / Plan:   Continued cough with nasal sinus drainage did not respond to course of Cipro. Chest x-ray is cleared Patient is not on ACE inhibitor An appointment has been made for him to see pulmonary early next week I plan to see him back in 2 months with a follow-up chest x-ray     Grace Isaac MD      [address] [place] [address] Office [phone]   Beeper [phone]  05/05/2016 10:52 AM

## 2016-05-09 ENCOUNTER — Encounter: Payer: Self-pay | Admitting: Family Medicine

## 2016-05-09 ENCOUNTER — Ambulatory Visit (INDEPENDENT_AMBULATORY_CARE_PROVIDER_SITE_OTHER): Payer: Commercial Managed Care - HMO | Admitting: Family Medicine

## 2016-05-09 VITALS — BP 125/70 | HR 53 | Temp 96.9°F | Ht 71.0 in | Wt 244.0 lb

## 2016-05-09 DIAGNOSIS — Z1211 Encounter for screening for malignant neoplasm of colon: Secondary | ICD-10-CM

## 2016-05-09 DIAGNOSIS — E66811 Obesity, class 1: Secondary | ICD-10-CM

## 2016-05-09 DIAGNOSIS — E6609 Other obesity due to excess calories: Secondary | ICD-10-CM

## 2016-05-09 DIAGNOSIS — Z6834 Body mass index (BMI) 34.0-34.9, adult: Secondary | ICD-10-CM | POA: Diagnosis not present

## 2016-05-09 DIAGNOSIS — D649 Anemia, unspecified: Secondary | ICD-10-CM | POA: Diagnosis not present

## 2016-05-09 DIAGNOSIS — R059 Cough, unspecified: Secondary | ICD-10-CM

## 2016-05-09 DIAGNOSIS — R05 Cough: Secondary | ICD-10-CM

## 2016-05-09 DIAGNOSIS — Z0000 Encounter for general adult medical examination without abnormal findings: Secondary | ICD-10-CM | POA: Diagnosis not present

## 2016-05-09 LAB — LIPID PANEL
CHOL/HDL RATIO: 3 ratio (ref 0.0–5.0)
Cholesterol, Total: 160 mg/dL (ref 100–199)
HDL: 53 mg/dL (ref >39)
LDL Calculated: 76 mg/dL (ref 0–99)
Triglycerides: 156 mg/dL — ABNORMAL HIGH (ref 0–149)
VLDL Cholesterol Cal: 31 mg/dL (ref 5–40)

## 2016-05-09 LAB — CMP14+EGFR
ALK PHOS: 107 IU/L (ref 39–117)
ALT: 36 IU/L (ref 0–44)
AST: 36 IU/L (ref 0–40)
Albumin/Globulin Ratio: 1.5 (ref 1.2–2.2)
Albumin: 4.6 g/dL (ref 3.6–4.8)
BILIRUBIN TOTAL: 0.5 mg/dL (ref 0.0–1.2)
BUN/Creatinine Ratio: 17 (ref 10–24)
BUN: 19 mg/dL (ref 8–27)
CHLORIDE: 101 mmol/L (ref 96–106)
CO2: 22 mmol/L (ref 18–29)
Calcium: 10.1 mg/dL (ref 8.6–10.2)
Creatinine, Ser: 1.09 mg/dL (ref 0.76–1.27)
GFR calc non Af Amer: 70 mL/min/{1.73_m2} (ref >59)
GFR, EST AFRICAN AMERICAN: 81 mL/min/{1.73_m2} (ref >59)
GLUCOSE: 98 mg/dL (ref 65–99)
Globulin, Total: 3 g/dL (ref 1.5–4.5)
Potassium: 4.4 mmol/L (ref 3.5–5.2)
Sodium: 143 mmol/L (ref 134–144)
TOTAL PROTEIN: 7.6 g/dL (ref 6.0–8.5)

## 2016-05-09 LAB — CBC WITH DIFFERENTIAL/PLATELET
BASOS ABS: 0.1 10*3/uL (ref 0.0–0.2)
Basos: 1 %
EOS (ABSOLUTE): 0.4 10*3/uL (ref 0.0–0.4)
Eos: 3 %
HEMOGLOBIN: 14.5 g/dL (ref 13.0–17.7)
Hematocrit: 43.9 % (ref 37.5–51.0)
IMMATURE GRANS (ABS): 0.1 10*3/uL (ref 0.0–0.1)
IMMATURE GRANULOCYTES: 1 %
LYMPHS: 31 %
Lymphocytes Absolute: 3.5 10*3/uL — ABNORMAL HIGH (ref 0.7–3.1)
MCH: 31.2 pg (ref 26.6–33.0)
MCHC: 33 g/dL (ref 31.5–35.7)
MCV: 94 fL (ref 79–97)
MONOCYTES: 7 %
Monocytes Absolute: 0.8 10*3/uL (ref 0.1–0.9)
Neutrophils Absolute: 6.3 10*3/uL (ref 1.4–7.0)
Neutrophils: 57 %
Platelets: 232 10*3/uL (ref 150–379)
RBC: 4.65 x10E6/uL (ref 4.14–5.80)
RDW: 14.5 % (ref 12.3–15.4)
WBC: 11 10*3/uL — AB (ref 3.4–10.8)

## 2016-05-09 NOTE — Progress Notes (Signed)
   HPI  Patient presents today for annual physical exam and to discuss cough.  Annual exam Doing well, no regular exercise due to cough recently. Recent CABG, he has recovered well. Patient is ready to have a colonoscopy, he would like a referral.  Cough Described as 10 weeks or so, starting after his hospitalization, productive cough, felt to be stemming from postnasal drip for most of the time, however over the last 2 days has had some improvement. No severe dyspnea. No fever, chills, sweats. Patient is tolerating foods and fluids normally. An appointment with pulmonology in 4 days.  States he has tried several medications including Flonase, Mucinex, home remedies. Has recently been prescribed Cipro, then Tessalon and guaifenesin.  PMH: Smoking status noted ROS: Per HPI  Objective: BP 125/70   Pulse (!) 53   Temp (!) 96.9 F (36.1 C) (Oral)   Ht '5\' 11"'$  (1.803 m)   Wt 244 lb (110.7 kg)   BMI 34.03 kg/m  Gen: NAD, alert, cooperative with exam HEENT: NCAT, nares with mild swelling of the turbinates on the left, oropharynx clear, TMs normal bilaterally, EOMI, PERRLA CV: RRR, good S1/S2, no murmur Resp: CTABL, no wheezes, non-labored Abd: SNTND, BS present, no guarding or organomegaly Ext: No edema, warm Neuro: Alert and oriented, strength 5/5 and sensation intact in all 4 extremities, symmetric smile, EOMI, PERRLA  Assessment and plan:  # Annual physical exam Patient has had pneumonia vaccinations at the [place] He is willing to get a colonoscopy, referral written basic labs today, patient has had orange juice earlier today.  # Cough Sounds most consistent with postnasal drip previously, however it is transitioning and seeming to improve. Patient has very good follow-up with Dr. Lamonte Sakai in 4 days Has been treated with cipro, has ahd a normal CXR   # Obesity Discussed therapeutic lifestyle changes with patient, currently limited by cough, he is willing to get active again. Not  watching his diet currently  # Normocytic anemia Repeating CBC today     Orders Placed This Encounter  Procedures  . Lipid panel  . CMP14+EGFR  . CBC with [place], MD [facility] 05/09/2016, 10:24 AM

## 2016-05-09 NOTE — Patient Instructions (Addendum)
Great to meet you!  You will get a call to schedule a Colonoscopy  Please let us know if we can help in any way!

## 2016-05-13 ENCOUNTER — Encounter: Payer: Self-pay | Admitting: Emergency Medicine

## 2016-05-13 ENCOUNTER — Ambulatory Visit (INDEPENDENT_AMBULATORY_CARE_PROVIDER_SITE_OTHER): Payer: Commercial Managed Care - HMO | Admitting: Emergency Medicine

## 2016-05-13 DIAGNOSIS — R05 Cough: Secondary | ICD-10-CM | POA: Diagnosis not present

## 2016-05-13 DIAGNOSIS — R059 Cough, unspecified: Secondary | ICD-10-CM

## 2016-05-13 MED ORDER — FLUTICASONE PROPIONATE 50 MCG/ACT NA SUSP: 2.0000 | Freq: Every day | NASAL | 4 refills | Status: DC

## 2016-05-13 NOTE — Progress Notes (Signed)
Subjective:    Patient ID: Hayden Rivera, male    DOB: 1950-07-31, 65 y.o.   MRN: [id number]  HPI 65 year old former smoker (15 pk-yrs) with a history of coronary artery disease, GERD, allergic rhinitis. He underwent recent CABG 103/17 by Dr Servando Snare. He recovered well but since that procedure has had cough. He relates this to persistent nasal drainage down throat. Some thicker nasal discharge. He was treated with cipro 04/07/16, then was treated with tessalon 11/20. He also started zyrtec, flonase, mucinex. He is starting to improve some. Still has some cough when he is supine. He does have some reflux occasionally, has had some the last few nights  CXR 05/05/16 - no evidence infiltrate, effusion.  PFT 02/29/16 - normal spirometry, volumes and DLCO.   Review of Systems  Constitutional: Negative for fever and unexpected weight change.  HENT: Positive for congestion, dental problem, postnasal drip and sore throat. Negative for ear pain, nosebleeds, rhinorrhea, sinus pressure, sneezing and trouble swallowing.   Eyes: Negative for redness and itching.  Respiratory: Positive for cough and chest tightness. Negative for shortness of breath and wheezing.   Cardiovascular: Negative for palpitations and leg swelling.  Gastrointestinal: Negative for nausea and vomiting.  Genitourinary: Negative for dysuria.  Musculoskeletal: Negative for joint swelling.  Skin: Negative for rash.  Neurological: Negative for headaches.  Hematological: Bruises/bleeds easily.  Psychiatric/Behavioral: Negative for dysphoric mood. The patient is not nervous/anxious.    Past Medical History:  Diagnosis Date  . Anginal pain ([place])    occ  . Arthritis   . CAD (coronary artery disease)    a. Anterior STEMI 2005 c/b vfib arrest, PCI to LAD at [facility]. b. Stent thrombosis 2006 with DES within prior LAD stent at [facility]. C. 09/2012: s/p balloon angioplasty to mLAD for severe stenosis in previously stented segment & DES to LCx;  initial enz neg but ruled in for NSTEMI after post-cath vagal sx, felt d/t distal emboliz of thrombus during case.  . Elevated hemidiaphragm    a. Noted 09/2012 - instructed to f/u PCP.  Marland Kitchen GERD (gastroesophageal reflux disease)   . Heart murmur   . History of kidney stones   . Hypertension   . Ischemic cardiomyopathy    a. Unclear prior EF but pt was told heart was weakened in past. b. EF normal 09/2012.  Marland Kitchen Myocardial infarction   . Shortness of breath dyspnea   . 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 VF arrest 2005 in setting of STEMI.  . Wears glasses      Family History  Problem Relation Age of Onset  . Heart disease Mother   . Stroke Father   . Healthy Brother      Social History   Social History  . Marital status: Married    Spouse name: N/A  . Number of children: N/A  . Years of education: N/A   Occupational History  . retired    Social History Main Topics  . Smoking status: Former Smoker    Packs/day: 0.50    Years: 10.00    Types: Cigars, Cigarettes    Quit date: 05/30/2002  . Smokeless tobacco: Never Used  . Alcohol use No  . Drug use: No  . Sexual activity: Not Currently   Other Topics Concern  . Not on file   Social History Narrative  . No narrative on file     Allergies  Allergen Reactions  . Altace [Ramipril] Shortness Of Breath and Other (See Comments)  cough  . Penicillins Swelling    Has patient had a PCN reaction causing immediate rash, facial/tongue/throat swelling, SOB or lightheadedness with hypotension:unsure Has patient had a PCN reaction causing severe rash involving mucus membranes or skin necrosis:unsure Has patient had a PCN reaction that required hospitalization:No Has patient had a PCN reaction occurring within the last 10 years:NO If all of the above answers are "NO", then may proceed with Cephalosporin use.  Childhood reaction       Outpatient Medications Prior to Visit  Medication Sig Dispense Refill  . acetaminophen  (TYLENOL) 500 MG tablet Take 500 mg by mouth every 6 (six) hours as needed (PAIN).     Marland Kitchen aspirin 81 MG tablet Take 1 tablet (81 mg total) by mouth daily.    . cholecalciferol (VITAMIN D) 1000 UNITS tablet Take 2,000 Units by mouth daily.     . clopidogrel (PLAVIX) 75 MG tablet Take 75 mg by mouth at bedtime.     . Coenzyme Q10 (COQ10) 100 MG CAPS Take 200 mg by mouth daily.     . isosorbide mononitrate (IMDUR) 30 MG 24 hr tablet Take 1 tablet (30 mg total) by mouth daily. 90 tablet 3  . metoprolol succinate (TOPROL-XL) 25 MG 24 hr tablet Take 0.5 tablets (12.5 mg total) by mouth daily. 90 tablet 3  . Omega-3 Fatty Acids (FISH OIL) 1200 MG CAPS Take 1 capsule by mouth 2 (two) times daily.    . pantoprazole (PROTONIX) 40 MG tablet Take 1 tablet (40 mg total) by mouth daily. 30 tablet 6  . rosuvastatin (CRESTOR) 10 MG tablet Take 10 mg by mouth at bedtime.    . Chlorpheniramine-APAP (CORICIDIN) 2-325 MG TABS Take by mouth every 6 (six) hours as needed.    . Melatonin 5 MG TABS Take 1 tablet by mouth at bedtime as needed.    . benzonatate (TESSALON) 100 MG capsule Take 1 capsule (100 mg total) by mouth 3 (three) times daily as needed for cough. 30 capsule 1  . guaiFENesin (MUCINEX) 600 MG 12 hr tablet Take 2 tablets (1,200 mg total) by mouth 2 (two) times daily as needed.    Marland Kitchen HYDROcodone-homatropine (HYCODAN) 5-1.5 MG/5ML syrup Take 5 mLs by mouth every 6 (six) hours as needed for cough. 120 mL 0   No facility-administered medications prior to visit.         Objective:   Physical Exam Vitals:   05/13/16 1622  BP: 118/80  Pulse: 64  SpO2: 95%  Weight: 244 lb 6.4 oz (110.9 kg)  Height: 5\' 11"  (1.803 m)   Gen: Pleasant, well-nourished, in no distress,  normal affect  ENT: No lesions,  mouth clear,  oropharynx clear, no postnasal drip  Neck: No JVD, no TMG, no carotid bruits  Lungs: No use of accessory muscles, clear without rales or rhonchi  Cardiovascular: RRR, heart sounds normal,  no murmur or gallops, no peripheral edema  Musculoskeletal: No deformities, no cyanosis or clubbing  Neuro: alert, non focal  Skin: Warm, no lesions or rashes       Assessment & Plan:  Cough Please stop your fish oil for now. We may restart at some point Please start taking flonase 2 sprays each side daily until next visit Continue zyrtec daily until next visit.  If you develop any reflux or heartburn please start Prilosec 20mg  daily.  Depending on your symptoms we will discuss any further workup at your next visit Follow with Dr Lamonte Sakai in 1 month  Herbie [place]  Lamonte Sakai, MD, PhD 06/22/2016, 4:11 PM [facility] [phone] or if no answer [phone]

## 2016-05-13 NOTE — Patient Instructions (Addendum)
Please stop your fish oil for now. We may restart at some point Please start taking flonase 2 sprays each side daily until next visit Continue zyrtec daily until next visit.  If you develop any reflux or heartburn please start Prilosec 20mg  daily.  Depending on your symptoms we will discuss any further workup at your next visit Follow with Dr Lamonte Sakai in 1 month

## 2016-05-16 ENCOUNTER — Telehealth: Payer: Self-pay | Admitting: *Deleted

## 2016-05-16 NOTE — Telephone Encounter (Signed)
error 

## 2016-05-16 NOTE — Telephone Encounter (Signed)
-----   Message from Timmothy Euler, MD sent at 05/10/2016  3:10 PM EST ----- Labs look good This includes cholesterol, glucose, kidney function, liver enzymes, and blood counts.  WBC mildliy elevated, unlikely to be a bacterial infection.   Thanks! sam

## 2016-05-16 NOTE — Telephone Encounter (Signed)
Pt notified of results Verbalizes understanding 

## 2016-05-30 HISTORY — PX: EYE EXAMINATION UNDER ANESTHESIA W/ RETINAL CRYOTHERAPY AND RETINAL LASER: SHX1561

## 2016-06-01 ENCOUNTER — Telehealth: Payer: Self-pay | Admitting: Emergency Medicine

## 2016-06-01 NOTE — Telephone Encounter (Signed)
Spoke with pt. He stated he has been coughing still and does not feel like it has been getting any better. He stated he tried RB recc. From when he saw him but nothing is working. Says he is out of his cough syrup and tried his cough pills with no relief. Pt. Requested an appointment instead of seeing if something could be called in. An appointment was made per his request. Nothing further is needed at this time.

## 2016-06-03 ENCOUNTER — Ambulatory Visit: Payer: Commercial Managed Care - HMO | Admitting: Adult Health

## 2016-06-10 ENCOUNTER — Ambulatory Visit: Payer: Commercial Managed Care - HMO | Admitting: Adult Health

## 2016-06-10 ENCOUNTER — Ambulatory Visit (INDEPENDENT_AMBULATORY_CARE_PROVIDER_SITE_OTHER): Payer: Commercial Managed Care - HMO | Admitting: Adult Health

## 2016-06-10 ENCOUNTER — Ambulatory Visit (INDEPENDENT_AMBULATORY_CARE_PROVIDER_SITE_OTHER)
Admission: RE | Admit: 2016-06-10 | Discharge: 2016-06-10 | Disposition: A | Discharge status: Home / Self Care | Payer: Commercial Managed Care - HMO | Source: Ambulatory Visit | Attending: Adult Health | Admitting: Adult Health

## 2016-06-10 ENCOUNTER — Other Ambulatory Visit (INDEPENDENT_AMBULATORY_CARE_PROVIDER_SITE_OTHER): Payer: Commercial Managed Care - HMO

## 2016-06-10 ENCOUNTER — Encounter: Payer: Self-pay | Admitting: Adult Health

## 2016-06-10 VITALS — BP 138/70 | HR 60 | Temp 97.6°F | Ht 71.0 in | Wt 245.8 lb

## 2016-06-10 DIAGNOSIS — R0609 Other forms of dyspnea: Secondary | ICD-10-CM

## 2016-06-10 DIAGNOSIS — R05 Cough: Secondary | ICD-10-CM | POA: Diagnosis not present

## 2016-06-10 DIAGNOSIS — N5201 Erectile dysfunction due to arterial insufficiency: Secondary | ICD-10-CM | POA: Diagnosis not present

## 2016-06-10 DIAGNOSIS — J209 Acute bronchitis, unspecified: Secondary | ICD-10-CM | POA: Diagnosis not present

## 2016-06-10 DIAGNOSIS — R059 Cough, unspecified: Secondary | ICD-10-CM

## 2016-06-10 DIAGNOSIS — N200 Calculus of kidney: Secondary | ICD-10-CM | POA: Diagnosis not present

## 2016-06-10 LAB — CBC WITH DIFFERENTIAL/PLATELET
BASOS ABS: 0.1 10*3/uL (ref 0.0–0.1)
Basophils Relative: 0.8 % (ref 0.0–3.0)
EOS ABS: 0.4 10*3/uL (ref 0.0–0.7)
Eosinophils Relative: 3.2 % (ref 0.0–5.0)
HEMATOCRIT: 43.1 % (ref 39.0–52.0)
Hemoglobin: 14.6 g/dL (ref 13.0–17.0)
LYMPHS PCT: 36.7 % (ref 12.0–46.0)
Lymphs Abs: 4.3 10*3/uL — ABNORMAL HIGH (ref 0.7–4.0)
MCHC: 33.8 g/dL (ref 30.0–36.0)
MCV: 90.8 fl (ref 78.0–100.0)
MONOS PCT: 9.5 % (ref 3.0–12.0)
Monocytes Absolute: 1.1 10*3/uL — ABNORMAL HIGH (ref 0.1–1.0)
Neutro Abs: 5.8 10*3/uL (ref 1.4–7.7)
Neutrophils Relative %: 49.8 % (ref 43.0–77.0)
PLATELETS: 240 10*3/uL (ref 150.0–400.0)
RBC: 4.75 Mil/uL (ref 4.22–5.81)
RDW: 14.6 % (ref 11.5–15.5)
WBC: 11.6 10*3/uL — ABNORMAL HIGH (ref 4.0–10.5)

## 2016-06-10 LAB — BRAIN NATRIURETIC PEPTIDE: PRO B NATRI PEPTIDE: 45 pg/mL (ref 0.0–100.0)

## 2016-06-10 LAB — SEDIMENTATION RATE: Sed Rate: 28 mm/hr — ABNORMAL HIGH (ref 0–20)

## 2016-06-10 MED ORDER — PREDNISONE 10 MG PO TABS: ORAL_TABLET | ORAL | 0 refills | Status: DC

## 2016-06-10 MED ORDER — BENZONATATE 200 MG PO CAPS: 200.0000 mg | ORAL_CAPSULE | Freq: Three times a day (TID) | ORAL | 3 refills | Status: DC | PRN

## 2016-06-10 MED ORDER — AZITHROMYCIN 250 MG PO TABS: ORAL_TABLET | ORAL | 0 refills | Status: AC

## 2016-06-10 NOTE — Progress Notes (Signed)
@Patient  ID: Hayden Rivera, male    DOB: Feb 21, 1951, 66 y.o.   MRN: [id number]  No chief complaint on file.   Referring provider: Timmothy Euler, MD  HPI: 66 yo former smoker with seen for pulmonary consult 2014 for Cough and Elevated hemidiaphragm with nml Stiff test  TEST  CXR 05/05/16 - no evidence infiltrate, effusion.  PFT 02/29/16 - normal spirometry, volumes and DLCO.  Echo 03/01/16 (prior to CABG) >EF 50-55%, Patent foramen ovale left to right shunt   06/10/2016 Acute OV  Pt presents for an acute office visit. He was seen 1 month ago for 2 month hx of cough and drainage after CABG done in 02/2016. He says that shortly after CABG surgery 03/03/16 he developed a progressively worsening cough with intermittent mucus . Says he has some post nasal drainage . Coughing fits can be quite bad a times. He was treated about 2 month ago with Cipro for 1 week. . Says it did not help . CXR did not show acute process.  He is a former smoker . PFT prior to CABG was normal with no restriction or airflow obstruction. DLCO nml . He is retired Clinical biochemist. Unclear if asbestos exposure in past. From local area. No unusual pets/hobbies or travel.  No weight loss, night sweats, or hemoptysis . No significant dyspnea.  He was seen 1 month ago treated for cyclical cough with PPI/Zyrtec and Flonase . He did not start PPI. Says he is taking tessalon , Zyrtec /Flonase without much help.   Of note he was treated briefly in Oct 2017 with Amiodarone . This was d/c after 4-6 weeks of therapy.   Spirometry today does show some mild restriction with FVC at 71%, FEV1 78%, ratio 82.   Hospital records from [facility] reviewed from Oct 2017.    Allergies  Allergen Reactions  . Altace [Ramipril] Shortness Of Breath and Other (See Comments)    cough  . Penicillins Swelling    Has patient had a PCN reaction causing immediate rash, facial/tongue/throat swelling, SOB or lightheadedness with hypotension:unsure Has patient  had a PCN reaction causing severe rash involving mucus membranes or skin necrosis:unsure Has patient had a PCN reaction that required hospitalization:No Has patient had a PCN reaction occurring within the last 10 years:NO If all of the above answers are "NO", then may proceed with Cephalosporin use.  Childhood reaction      Immunization History  Administered Date(s) Administered  . Influenza,inj,Quad PF,36+ Mos 03/07/2016  . Influenza-Unspecified 04/04/2015  . Tdap 10/24/2011    Past Medical History:  Diagnosis Date  . Anginal pain ([place])    occ  . Arthritis   . CAD (coronary artery disease)    a. Anterior STEMI 2005 c/b vfib arrest, PCI to LAD at [facility]. b. Stent thrombosis 2006 with DES within prior LAD stent at [facility]. C. 09/2012: s/p balloon angioplasty to mLAD for severe stenosis in previously stented segment & DES to LCx; initial enz neg but ruled in for NSTEMI after post-cath vagal sx, felt d/t distal emboliz of thrombus during case.  . Elevated hemidiaphragm    a. Noted 09/2012 - instructed to f/u PCP.  Marland Kitchen GERD (gastroesophageal reflux disease)   . Heart murmur   . History of kidney stones   . Hypertension   . Ischemic cardiomyopathy    a. Unclear prior EF but pt was told heart was weakened in past. b. EF normal 09/2012.  Marland Kitchen Myocardial infarction   . Shortness of breath dyspnea   .  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 VF arrest 2005 in setting of STEMI.  . Wears glasses     Tobacco History: History  Smoking Status  . Former Smoker  . Packs/day: 0.50  . Years: 10.00  . Types: Cigars, Cigarettes  . Quit date: 05/30/2002  Smokeless Tobacco  . Never Used   Counseling given: Not Answered   Outpatient Encounter Prescriptions as of 06/10/2016  Medication Sig  . acetaminophen (TYLENOL) 500 MG tablet Take 500 mg by mouth every 6 (six) hours as needed (PAIN).   Marland Kitchen aspirin 81 MG tablet Take 1 tablet (81 mg total) by mouth daily.  . cholecalciferol (VITAMIN D) 1000 UNITS  tablet Take 2,000 Units by mouth daily.   . clopidogrel (PLAVIX) 75 MG tablet Take 75 mg by mouth at bedtime.   . Coenzyme Q10 (COQ10) 100 MG CAPS Take 200 mg by mouth daily.   . fluticasone (FLONASE) 50 MCG/ACT nasal spray Place 2 sprays into both nostrils daily.  . isosorbide mononitrate (IMDUR) 30 MG 24 hr tablet Take 1 tablet (30 mg total) by mouth daily.  . Melatonin 5 MG TABS Take 1 tablet by mouth at bedtime as needed.  . metoprolol succinate (TOPROL-XL) 25 MG 24 hr tablet Take 0.5 tablets (12.5 mg total) by mouth daily.  . pantoprazole (PROTONIX) 40 MG tablet Take 1 tablet (40 mg total) by mouth daily.  . rosuvastatin (CRESTOR) 10 MG tablet Take 10 mg by mouth at bedtime.  Marland Kitchen azithromycin (ZITHROMAX Z-PAK) 250 MG tablet Take 2 tablets (500 mg) on  Day 1,  followed by 1 tablet (250 mg) once daily on Days 2 through 5.  . benzonatate (TESSALON) 200 MG capsule Take 1 capsule (200 mg total) by mouth 3 (three) times daily as needed for cough.  . Chlorpheniramine-APAP (CORICIDIN) 2-325 MG TABS Take by mouth every 6 (six) hours as needed.  . Omega-3 Fatty Acids (FISH OIL) 1200 MG CAPS Take 1 capsule by mouth 2 (two) times daily.  . predniSONE (DELTASONE) 10 MG tablet 4 tabs for 2 days, then 3 tabs for 2 days, 2 tabs for 2 days, then 1 tab for 2 days, then stop   No facility-administered encounter medications on file as of 06/10/2016.      Review of Systems  Constitutional:   No  weight loss, night sweats,  Fevers, chills,  +fatigue, or  lassitude.  HEENT:   No headaches,  Difficulty swallowing,  Tooth/dental problems, or  Sore throat,                No sneezing, itching, ear ache,  +nasal congestion, post nasal drip,   CV:  No chest pain,  Orthopnea, PND, swelling in lower extremities, anasarca, dizziness, palpitations, syncope.   GI  No heartburn, indigestion, abdominal pain, nausea, vomiting, diarrhea, change in bowel habits, loss of appetite, bloody stools.   Resp:   No chest wall  deformity  Skin: no rash or lesions.  GU: no dysuria, change in color of urine, no urgency or frequency.  No flank pain, no hematuria   MS:  No joint pain or swelling.  No decreased range of motion.  No back pain.    Physical Exam  BP 138/70   Pulse 60   Temp 97.6 F (36.4 C) (Oral)   Ht 5\' 11"  (1.803 m)   Wt 245 lb 12.8 oz (111.5 kg)   SpO2 95%   BMI 34.28 kg/m   GEN: A/Ox3; pleasant , NAD, elderly , obese  HEENT:  [place]/AT,  EACs-clear, TMs-wnl, NOSE-clear, THROAT-clear, no lesions, no postnasal drip or exudate noted.   NECK:  Supple w/ fair ROM; no JVD; normal carotid impulses w/o bruits; no thyromegaly or nodules palpated; no lymphadenopathy.    RESP  Clear  P & A; w/o, wheezes/ rales/ or rhonchi. no accessory muscle use, no dullness to percussion  CARD:  RRR, 1/6 SM  Tr  peripheral edema, pulses intact, no cyanosis or clubbing.  GI:   Soft & nt; nml bowel sounds; no organomegaly or masses detected.   Musco: Warm bil, no deformities or joint swelling noted.   Neuro: alert, no focal deficits noted.    Skin: Warm, no lesions or rashes  Psych:  No change in mood or affect. No depression or anxiety.  No memory loss.  Lab Results:  CBC    Component Value Date/Time   WBC 11.0 (H) 05/09/2016 1024   WBC 14.7 (H) 03/05/2016 0301   RBC 4.65 05/09/2016 1024   RBC 3.20 (L) 03/05/2016 0301   HGB 10.2 (L) 03/05/2016 0301   HCT 43.9 05/09/2016 1024   PLT 232 05/09/2016 1024   MCV 94 05/09/2016 1024   MCH 31.2 05/09/2016 1024   MCH 31.9 03/05/2016 0301   MCHC 33.0 05/09/2016 1024   MCHC 32.2 03/05/2016 0301   RDW 14.5 05/09/2016 1024   LYMPHSABS 3.5 (H) 05/09/2016 1024   MONOABS 768 02/12/2016 0930   EOSABS 0.4 05/09/2016 1024   BASOSABS 0.1 05/09/2016 1024    BMET    Component Value Date/Time   NA 143 05/09/2016 1024   K 4.4 05/09/2016 1024   CL 101 05/09/2016 1024   CO2 22 05/09/2016 1024   GLUCOSE 98 05/09/2016 1024   GLUCOSE 106 (H) 03/06/2016 0318    BUN 19 05/09/2016 1024   CREATININE 1.09 05/09/2016 1024   CREATININE 1.00 02/12/2016 0930   CALCIUM 10.1 05/09/2016 1024   GFRNONAA 70 05/09/2016 1024   GFRAA 81 05/09/2016 1024    BNP No results found for: BNP  ProBNP    Component Value Date/Time   PROBNP 44.0 10/31/2012 0916    Imaging: Dg Chest 2 View  Result Date: 06/10/2016 CLINICAL DATA:  Dry cough x 3 mo. Hx of CABG, CAD, MI. Ex smoker. EXAM: CHEST  2 VIEW COMPARISON:  05/05/2016 FINDINGS: Changes from CABG surgery are stable. The cardiac silhouette is normal in size and configuration. No mediastinal or hilar masses. No evidence of adenopathy. Clear lungs.  No pleural effusion.  No pneumothorax. Skeletal structures are intact. IMPRESSION: No active cardiopulmonary disease. Electronically Signed   By: Lajean Manes M.D.   On: 06/10/2016 12:33     Assessment & Plan:   No problem-specific Assessment & Plan notes found for this encounter.     Rexene Edison, NP 06/10/2016

## 2016-06-10 NOTE — Assessment & Plan Note (Signed)
Chronic cough x 3 month ~began around end Oct 2017  Suspect this is a Acute Bronchitis that has waxed and waned with upper airway cough (possible from recent ETT)  He was treated with Amiodarone briefly but cxr has been unrevealing . Spirometry does show some mild restriction now . Will check ESR and repeat CXR . If abn can consider CT chest and repeat full PFT with DLCO.  For now treat with ABX and Steroids along w/ cough control regimen and trigger control of GERD/AR.  Plan  Patient Instructions  Zpack take as directed.  Prednisone taper over next week.  Delsym 2 tsp Twice daily  As needed  Cough .  Mucinex Twice daily  As needed  Cough/congestion  Tessalon Three times a day  As needed  Cough.  Continue on Zyrtec 81m in am .  Add Chlorphenaramine 425m2 At bedtime  .  Start Prilosec 2096mdaily before meal .  Follow up with Dr. ByrLamonte Sakain 2 weeks and As needed   Please contact office for sooner follow up if symptoms do not improve or worsen or seek emergency care

## 2016-06-10 NOTE — Patient Instructions (Addendum)
Zpack take as directed.  Prednisone taper over next week.  Delsym 2 tsp Twice daily  As needed  Cough .  Mucinex Twice daily  As needed  Cough/congestion  Tessalon Three times a day  As needed  Cough.  Continue on Zyrtec 10mg  in am .  Add Chlorphenaramine 4mg  2 At bedtime  .  Start Prilosec 20mg   daily before meal .  Follow up with Dr. Lamonte Sakai  In 2 weeks and As needed   Please contact office for sooner follow up if symptoms do not improve or worsen or seek emergency care

## 2016-06-10 NOTE — Assessment & Plan Note (Signed)
Flare   Plan  Patient Instructions  Zpack take as directed.  Prednisone taper over next week.  Delsym 2 tsp Twice daily  As needed  Cough .  Mucinex Twice daily  As needed  Cough/congestion  Tessalon Three times a day  As needed  Cough.  Continue on Zyrtec 10mg  in am .  Add Chlorphenaramine 4mg  2 At bedtime  .  Start Prilosec 20mg   daily before meal .  Follow up with Dr. Lamonte Sakai  In 2 weeks and As needed   Please contact office for sooner follow up if symptoms do not improve or worsen or seek emergency care

## 2016-06-22 NOTE — Assessment & Plan Note (Signed)
Please stop your fish oil for now. We may restart at some point Please start taking flonase 2 sprays each side daily until next visit Continue zyrtec daily until next visit.  If you develop any reflux or heartburn please start Prilosec 20mg  daily.  Depending on your symptoms we will discuss any further workup at your next visit Follow with Dr Lamonte Sakai in 1 month

## 2016-06-23 ENCOUNTER — Ambulatory Visit (INDEPENDENT_AMBULATORY_CARE_PROVIDER_SITE_OTHER): Payer: Commercial Managed Care - HMO | Admitting: Emergency Medicine

## 2016-06-23 ENCOUNTER — Encounter: Payer: Self-pay | Admitting: Emergency Medicine

## 2016-06-23 DIAGNOSIS — R059 Cough, unspecified: Secondary | ICD-10-CM

## 2016-06-23 DIAGNOSIS — R05 Cough: Secondary | ICD-10-CM | POA: Diagnosis not present

## 2016-06-23 NOTE — Patient Instructions (Signed)
We will stop Protonix for now. You may need to restart if your cough increases.  Stay on the chlorpheniramine tabs every evening (OTC) Consider restarting restarting the flonase nasal spray if your cough or drainage increase  Please follow with Dr Lamonte Sakai if your cough or breathing worsen.

## 2016-06-23 NOTE — Progress Notes (Signed)
Subjective:    Patient ID: Hayden Rivera, male    DOB: [birth year], 66 y.o.   MRN: [id number]  Cough  Associated symptoms include postnasal drip and a sore throat. Pertinent negatives include no ear pain, eye redness, fever, headaches, rash, rhinorrhea, shortness of breath or wheezing.   66 year old former smoker (15 pk-yrs) with a history of coronary artery disease, GERD, allergic rhinitis. He underwent recent CABG 103/17 by Dr Servando Snare. He recovered well but since that procedure has had cough. He relates this to persistent nasal drainage down throat. Some thicker nasal discharge. He was treated with cipro 04/07/16, then was treated with tessalon 11/20. He also started zyrtec, flonase, mucinex. He is starting to improve some. Still has some cough when he is supine. He does have some reflux occasionally, has had some the last few nights  CXR 05/05/16 - no evidence infiltrate, effusion.  PFT 02/29/16 - normal spirometry, volumes and DLCO.   ROV 06/23/16 -- this is a f/u visit for patient with chronic recurrent cough. He has a history of coronary disease and CABG as outlined above.  Allergic rhinitis and chronic drainage. He was seen again in our office on 06/10/16 with similar symptoms. He was treated with azithromycin, prednisone, delsym, mucinex. Chlorpheniramine and omeprazole were added. He reports now that he is feeling better. He still has some daily cough but his drainage is better. He is now off protonix, stopped yesterday. Stopped nasal spray. Taking chlorpheniramine at night. Rarely uses tessalon perles.    Review of Systems  Constitutional: Negative for fever and unexpected weight change.  HENT: Positive for congestion, dental problem, postnasal drip and sore throat. Negative for ear pain, nosebleeds, rhinorrhea, sinus pressure, sneezing and trouble swallowing.   Eyes: Negative for redness and itching.  Respiratory: Positive for cough and chest tightness. Negative for shortness of breath and  wheezing.   Cardiovascular: Negative for palpitations and leg swelling.  Gastrointestinal: Negative for nausea and vomiting.  Genitourinary: Negative for dysuria.  Musculoskeletal: Negative for joint swelling.  Skin: Negative for rash.  Neurological: Negative for headaches.  Hematological: Bruises/bleeds easily.  Psychiatric/Behavioral: Negative for dysphoric mood. The patient is not nervous/anxious.    Past Medical History:  Diagnosis Date  . Anginal pain ([place])    occ  . Arthritis   . CAD (coronary artery disease)    a. Anterior STEMI 2005 c/b vfib arrest, PCI to LAD at [facility]. b. Stent thrombosis 2006 with DES within prior LAD stent at [facility]. C. 09/2012: s/p balloon angioplasty to mLAD for severe stenosis in previously stented segment & DES to LCx; initial enz neg but ruled in for NSTEMI after post-cath vagal sx, felt d/t distal emboliz of thrombus during case.  . Elevated hemidiaphragm    a. Noted 09/2012 - instructed to f/u PCP.  Marland Kitchen GERD (gastroesophageal reflux disease)   . Heart murmur   . History of kidney stones   . Hypertension   . Ischemic cardiomyopathy    a. Unclear prior EF but pt was told heart was weakened in past. b. EF normal 09/2012.  Marland Kitchen Myocardial infarction   . Shortness of breath dyspnea   . Ventricular fibrillation ([place])    a. VF arrest 2005 in setting of STEMI.  . Wears glasses      Family History  Problem Relation Age of Onset  . Heart disease Mother   . Stroke Father   . Healthy Brother      Social History   Social History  . Marital  status: Married    Spouse name: N/A  . Number of children: N/A  . Years of education: N/A   Occupational History  . retired    Social History Main Topics  . Smoking status: Former Smoker    Packs/day: 0.50    Years: 10.00    Types: Cigars, Cigarettes    Quit date: 05/30/2002  . Smokeless tobacco: Never Used  . Alcohol use No  . Drug use: No  . Sexual activity: Not Currently   Other Topics Concern  . Not on  file   Social History Narrative  . No narrative on file     Allergies  Allergen Reactions  . Altace [Ramipril] Shortness Of Breath and Other (See Comments)    cough  . Penicillins Swelling    Has patient had a PCN reaction causing immediate rash, facial/tongue/throat swelling, SOB or lightheadedness with hypotension:unsure Has patient had a PCN reaction causing severe rash involving mucus membranes or skin necrosis:unsure Has patient had a PCN reaction that required hospitalization:No Has patient had a PCN reaction occurring within the last 10 years:NO If all of the above answers are "NO", then may proceed with Cephalosporin use.  Childhood reaction       Outpatient Medications Prior to Visit  Medication Sig Dispense Refill  . acetaminophen (TYLENOL) 500 MG tablet Take 500 mg by mouth every 6 (six) hours as needed (PAIN).     Marland Kitchen aspirin 81 MG tablet Take 1 tablet (81 mg total) by mouth daily.    . benzonatate (TESSALON) 200 MG capsule Take 1 capsule (200 mg total) by mouth 3 (three) times daily as needed for cough. 30 capsule 3  . Chlorpheniramine-APAP (CORICIDIN) 2-325 MG TABS Take by mouth every 6 (six) hours as needed.    . cholecalciferol (VITAMIN D) 1000 UNITS tablet Take 2,000 Units by mouth daily.     . clopidogrel (PLAVIX) 75 MG tablet Take 75 mg by mouth at bedtime.     . Coenzyme Q10 (COQ10) 100 MG CAPS Take 200 mg by mouth daily.     . fluticasone (FLONASE) 50 MCG/ACT nasal spray Place 2 sprays into both nostrils daily. 16 g 4  . Melatonin 5 MG TABS Take 1 tablet by mouth at bedtime as needed.    . metoprolol succinate (TOPROL-XL) 25 MG 24 hr tablet Take 0.5 tablets (12.5 mg total) by mouth daily. 90 tablet 3  . Omega-3 Fatty Acids (FISH OIL) 1200 MG CAPS Take 1 capsule by mouth 2 (two) times daily.    . pantoprazole (PROTONIX) 40 MG tablet Take 1 tablet (40 mg total) by mouth daily. 30 tablet 6  . rosuvastatin (CRESTOR) 10 MG tablet Take 10 mg by mouth at bedtime.    .  predniSONE (DELTASONE) 10 MG tablet 4 tabs for 2 days, then 3 tabs for 2 days, 2 tabs for 2 days, then 1 tab for 2 days, then stop 20 tablet 0  . isosorbide mononitrate (IMDUR) 30 MG 24 hr tablet Take 1 tablet (30 mg total) by mouth daily. 90 tablet 3   No facility-administered medications prior to visit.         Objective:   Physical Exam Vitals:   06/23/16 0940  BP: 116/88  Pulse: 62  SpO2: 94%  Weight: 248 lb (112.5 kg)  Height: 5\' 11"  (1.803 m)   Gen: Pleasant, well-nourished, in no distress,  normal affect  ENT: No lesions,  mouth clear,  oropharynx clear, no postnasal drip  Neck: No JVD,  no TMG, no carotid bruits  Lungs: No use of accessory muscles, clear without rales or rhonchi  Cardiovascular: RRR, heart sounds normal, no murmur or gallops, no peripheral edema  Musculoskeletal: No deformities, no cyanosis or clubbing  Neuro: alert, non focal  Skin: Warm, no lesions or rashes       Assessment & Plan:  Cough Chronic cough with contribution from rhinitis, possibly also GERD although he does not have heartburn sx. He benefited the most from pred / abx, possibly treated an acute bronchitis. He has scaled back his maintenance therapy, still has some baseline cough.   We will stop Protonix for now. You may need to restart if your cough increases.  Stay on the chlorpheniramine tabs every evening (OTC) Consider restarting restarting the flonase nasal spray if your cough or drainage increase  Please follow with Dr Lamonte Sakai if your cough or breathing worsen.  Baltazar Apo, MD, PhD 06/23/2016, 10:27 AM [facility] [phone] or if no answer [phone]

## 2016-06-23 NOTE — Assessment & Plan Note (Signed)
Chronic cough with contribution from rhinitis, possibly also GERD although he does not have heartburn sx. He benefited the most from pred / abx, possibly treated an acute bronchitis. He has scaled back his maintenance therapy, still has some baseline cough.   We will stop Protonix for now. You may need to restart if your cough increases.  Stay on the chlorpheniramine tabs every evening (OTC) Consider restarting restarting the flonase nasal spray if your cough or drainage increase  Please follow with Dr Lamonte Sakai if your cough or breathing worsen.

## 2016-07-21 ENCOUNTER — Encounter (INDEPENDENT_AMBULATORY_CARE_PROVIDER_SITE_OTHER): Payer: Self-pay

## 2016-07-21 ENCOUNTER — Ambulatory Visit (INDEPENDENT_AMBULATORY_CARE_PROVIDER_SITE_OTHER): Payer: Medicare HMO | Admitting: Internal Medicine

## 2016-07-21 ENCOUNTER — Encounter: Payer: Self-pay | Admitting: Internal Medicine

## 2016-07-21 VITALS — BP 120/86 | HR 60 | Ht 71.0 in | Wt 252.8 lb

## 2016-07-21 DIAGNOSIS — I255 Ischemic cardiomyopathy: Secondary | ICD-10-CM

## 2016-07-21 DIAGNOSIS — Z951 Presence of aortocoronary bypass graft: Secondary | ICD-10-CM | POA: Diagnosis not present

## 2016-07-21 DIAGNOSIS — I2510 Atherosclerotic heart disease of native coronary artery without angina pectoris: Secondary | ICD-10-CM

## 2016-07-21 DIAGNOSIS — I480 Paroxysmal atrial fibrillation: Secondary | ICD-10-CM | POA: Diagnosis not present

## 2016-07-21 DIAGNOSIS — E785 Hyperlipidemia, unspecified: Secondary | ICD-10-CM | POA: Diagnosis not present

## 2016-07-21 DIAGNOSIS — I10 Essential (primary) hypertension: Secondary | ICD-10-CM

## 2016-07-21 MED ORDER — METOPROLOL SUCCINATE ER 25 MG PO TB24: 12.5000 mg | ORAL_TABLET | Freq: Every day | ORAL | 3 refills | Status: DC

## 2016-07-21 MED ORDER — METOPROLOL SUCCINATE ER 25 MG PO TB24: 12.5000 mg | ORAL_TABLET | Freq: Every day | ORAL | 6 refills | Status: DC

## 2016-07-21 NOTE — Progress Notes (Signed)
Follow-up Outpatient Visit Date: 07/21/2016  Primary Care Provider: Kenn File, MD [place] [address]  Chief Complaint: Follow-up coronary artery disease  HPI:  Hayden Rivera is a 66 y.o. year-old male with history of coronary artery disease status post multiple PCI's and subsequent CABG in 02/2016, postoperative atrial fibrillation, ischemic cardiomyopathy with low normal LVEF, hypertension, and hyperlipidemia who presents for follow-up of CAD. He has been doing well since his last visit in our office on 04/18/16. He had a cough for several weeks following CABG, though this has now resolved after 2 courses of antibiotics. He reports his breathing is normal. He denies chest pain, palpitations, and orthopnea and leg edema. He noticed lightheadedness after taking the pain medication for tooth pain last week. This has since resolved. He has not started cardiac rehabilitation and would rather not participate in this program given cost issues. He would like to begin exercising on his own. He remains compliant with medications, including dual antiplatelet therapy with aspirin and clopidogrel. He has not had any falls or bleeding.  --------------------------------------------------------------------------------------------------  Cardiovascular History & Procedures: Cardiovascular Problems:  CAD status post PCI and CABG  Ischemic cardiomyopathy (most recent LVEF 50-55%)  Postoperative atrial fibrillation  Risk Factors:  Known CAD, hypertension, hyperlipidemia, obesity, male gender, and age greater than 93  Cath/PCI:  LHC (02/17/16): LMCA was 50% distal stenosis. LAD heavily calcified with 40% proximal disease before previous least placed stent. Mid LAD stent with 30% ISR. Ramus with 90% ostial stenosis. LCx with 95% ostial stenosis. LCx stent is patent. RCA with luminal irregularities.  CV Surgery:  CABG (03/03/16, Dr. Servando Snare): We mid LAD, left radial to ramus, and SVG  to LCx.  EP Procedures and Devices:  None  Non-Invasive Evaluation(s):  TTE (02/29/16): Normal LV size with mild concentric LVH. LVEF 50-55% with mid anteroseptal, apical septal and apical anterior akinesis. Normal LV diastolic function. Mild aortic stenosis. Mild MR. Normal RV size and function.  Recent CV Pertinent Labs: Lab Results  Component Value Date   CHOL 160 05/09/2016   HDL 53 05/09/2016   LDLCALC 76 05/09/2016   TRIG 156 (H) 05/09/2016   CHOLHDL 3.0 05/09/2016   CHOLHDL 3.4 07/16/2015   INR 1.67 03/01/2016   K 4.4 05/09/2016   MG 2.3 03/04/2016   BUN 19 05/09/2016   CREATININE 1.09 05/09/2016   CREATININE 1.00 02/12/2016    Past medical and surgical history were reviewed and updated in EPIC.  Outpatient Encounter Prescriptions as of 07/21/2016  Medication Sig  . acetaminophen (TYLENOL) 500 MG tablet Take 500 mg by mouth every 6 (six) hours as needed (PAIN).   Marland Kitchen aspirin 81 MG tablet Take 1 tablet (81 mg total) by mouth daily.  . cholecalciferol (VITAMIN D) 1000 UNITS tablet Take 2,000 Units by mouth daily.   . clopidogrel (PLAVIX) 75 MG tablet Take 75 mg by mouth at bedtime.   . Coenzyme Q10 (COQ10) 100 MG CAPS Take 200 mg by mouth daily.   . fluticasone (FLONASE) 50 MCG/ACT nasal spray Place 2 sprays into both nostrils daily.  . isosorbide mononitrate (IMDUR) 30 MG 24 hr tablet Take 1 tablet (30 mg total) by mouth daily.  . Melatonin 5 MG TABS Take 1 tablet by mouth at bedtime as needed.  . metoprolol succinate (TOPROL-XL) 25 MG 24 hr tablet Take 0.5 tablets (12.5 mg total) by mouth daily.  . rosuvastatin (CRESTOR) 10 MG tablet Take 10 mg by mouth at bedtime.  . [DISCONTINUED] benzonatate (  TESSALON) 200 MG capsule Take 1 capsule (200 mg total) by mouth 3 (three) times daily as needed for cough.  . [DISCONTINUED] Chlorpheniramine-APAP (CORICIDIN) 2-325 MG TABS Take by mouth every 6 (six) hours as needed.  . [DISCONTINUED] Omega-3 Fatty Acids (FISH OIL) 1200 MG CAPS  Take 1 capsule by mouth 2 (two) times daily.  . [DISCONTINUED] pantoprazole (PROTONIX) 40 MG tablet Take 1 tablet (40 mg total) by mouth daily.   No facility-administered encounter medications on file as of 07/21/2016.     Allergies: Altace [ramipril] and Penicillins  Social History   Social History  . Marital status: Married    Spouse name: N/A  . Number of children: N/A  . Years of education: N/A   Occupational History  . retired    Social History Main Topics  . Smoking status: Former Smoker    Packs/day: 0.50    Years: 10.00    Types: Cigars, Cigarettes    Quit date: 05/30/2002  . Smokeless tobacco: Never Used  . Alcohol use No  . Drug use: No  . Sexual activity: Not Currently   Other Topics Concern  . Not on file   Social History Narrative  . No narrative on file    Family History  Problem Relation Age of Onset  . Heart disease Mother   . Stroke Father   . Healthy Brother     Review of Systems: A 12-system review of systems was performed and was negative except as noted in the HPI.  --------------------------------------------------------------------------------------------------  Physical Exam: BP 120/86 (BP Location: Left Arm, Patient Position: Sitting, Cuff Size: Large)   Pulse 60   Ht 5\' 11"  (1.803 m)   Wt 252 lb 12.8 oz (114.7 kg)   SpO2 95%   BMI 35.26 kg/m   General:  Obese man, seated comfortably in the exam room. He is accompanied by his wife. HEENT: No conjunctival pallor or scleral icterus.  Moist mucous membranes.  OP clear. Neck: Supple without lymphadenopathy, thyromegaly, JVD, or HJR.  No carotid bruit. Lungs: Normal work of breathing.  Mildly diminished breath sounds at the bases. Otherwise fair air movement without wheezes or crackles. Heart: Regular rate and rhythm without murmurs, rubs, or gallops.  Non-displaced PMI. Abd: Bowel sounds present.  Soft, NT/ND without hepatosplenomegaly Ext: No lower extremity edema.  Radial, PT, and DP  pulses are 2+ bilaterally. Skin: warm and dry without rash. Sternotomy scar is healing well.  EKG:  Normal sinus rhythm with low voltage QRS and left anterior fascicular block. Poor R-wave progression. No significant change from prior tracing on 04/18/16 (I have personally reviewed both tracings).  Lab Results  Component Value Date   WBC 11.6 (H) 06/10/2016   HGB 14.6 06/10/2016   HCT 43.1 06/10/2016   MCV 90.8 06/10/2016   PLT 240.0 06/10/2016    Lab Results  Component Value Date   NA 143 05/09/2016   K 4.4 05/09/2016   CL 101 05/09/2016   CO2 22 05/09/2016   BUN 19 05/09/2016   CREATININE 1.09 05/09/2016   GLUCOSE 98 05/09/2016   ALT 36 05/09/2016    Lab Results  Component Value Date   CHOL 160 05/09/2016   HDL 53 05/09/2016   LDLCALC 76 05/09/2016   TRIG 156 (H) 05/09/2016   CHOLHDL 3.0 05/09/2016    --------------------------------------------------------------------------------------------------  ASSESSMENT AND PLAN: Coronary artery disease status post CABG and PCI Hayden Rivera is recovering well from his CABG in October. We will continue his current medications. He  does not wish to participate in cardiac rehabilitation. I encouraged him to begin walking. He should not do any strenuous activities or heavy lifting with his upper body until cleared by Dr. Servando Snare.  Ischemic cardiomyopathy Pre-CABG LVEF was low normal at 50-55%. The patient appears euvolemic and well compensated today. No medication changes at this time.  Paroxysmal atrial fibrillation Postoperative course following CABG complicated by atrial fibrillation. Patient was initially on amiodarone, though this was discontinued at his last visit with [place] in November. He does not have symptoms to suggest recurrence of atrial fibrillation. EKG today demonstrates sinus rhythm.  Hypertension Blood pressure is upper normal today. We will continue his current medications.  Hyperlipidemia Patient on  moderate-intensity statin. We will readdress escalation of this when he returns for follow-up.  Follow-up: Return to clinic in 4 months.  Nelva Bush, MD 07/23/2016 9:51 AM

## 2016-07-21 NOTE — Patient Instructions (Signed)
Medication Instructions:  .Your physician recommends that you continue on your current medications as directed. Please refer to the Current Medication list given to you today.   Labwork: None   Testing/Procedures: None  Follow-Up: Your physician recommends that you schedule a follow-up appointment in: 4 months with Dr End. (June 2018)        If you need a refill on your cardiac medications before your next appointment, please call your pharmacy.

## 2016-07-23 DIAGNOSIS — I9789 Other postprocedural complications and disorders of the circulatory system, not elsewhere classified: Secondary | ICD-10-CM

## 2016-07-23 DIAGNOSIS — I2510 Atherosclerotic heart disease of native coronary artery without angina pectoris: Secondary | ICD-10-CM

## 2016-07-23 DIAGNOSIS — I4891 Unspecified atrial fibrillation: Secondary | ICD-10-CM | POA: Insufficient documentation

## 2016-07-23 DIAGNOSIS — I255 Ischemic cardiomyopathy: Secondary | ICD-10-CM | POA: Insufficient documentation

## 2016-07-29 ENCOUNTER — Other Ambulatory Visit: Payer: Self-pay | Admitting: Cardiothoracic Surgery

## 2016-07-29 DIAGNOSIS — Z951 Presence of aortocoronary bypass graft: Secondary | ICD-10-CM

## 2016-08-04 ENCOUNTER — Ambulatory Visit
Admission: RE | Admit: 2016-08-04 | Discharge: 2016-08-04 | Disposition: A | Discharge status: Home / Self Care | Payer: Medicare HMO | Source: Ambulatory Visit | Attending: Cardiothoracic Surgery | Admitting: Cardiothoracic Surgery

## 2016-08-04 ENCOUNTER — Encounter: Payer: Self-pay | Admitting: Cardiothoracic Surgery

## 2016-08-04 ENCOUNTER — Ambulatory Visit (INDEPENDENT_AMBULATORY_CARE_PROVIDER_SITE_OTHER): Payer: Medicare HMO | Admitting: Cardiothoracic Surgery

## 2016-08-04 VITALS — BP 107/68 | HR 62 | Resp 20 | Ht 71.0 in | Wt 252.0 lb

## 2016-08-04 DIAGNOSIS — I2510 Atherosclerotic heart disease of native coronary artery without angina pectoris: Secondary | ICD-10-CM | POA: Diagnosis not present

## 2016-08-04 DIAGNOSIS — Z951 Presence of aortocoronary bypass graft: Secondary | ICD-10-CM

## 2016-08-04 DIAGNOSIS — I208 Other forms of angina pectoris: Secondary | ICD-10-CM | POA: Diagnosis not present

## 2016-08-04 DIAGNOSIS — Z950 Presence of cardiac pacemaker: Secondary | ICD-10-CM | POA: Diagnosis not present

## 2016-08-04 NOTE — Progress Notes (Signed)
[address]       [place] [address]             [phone]      [place] Medical Record #[id number] Date of Birth: [birth year]  Referring: Larey Dresser, MD Primary Care: Kenn File, MD  Chief Complaint:   POST OP FOLLOW UP 03/01/2016  OPERATIVE REPORT PREOPERATIVE DIAGNOSIS:  Coronary occlusive disease with angina. POSTOPERATIVE DIAGNOSIS:  Coronary occlusive disease with angina. SURGICAL PROCEDURE:  Coronary artery bypass grafting x3 with the left internal mammary to the left anterior descending coronary artery, left radial bypass to the intermediate coronary artery, and reverse saphenous vein graft to the circumflex coronary artery with right thigh greater saphenous vein harvesting endoscopically. SURGEON:  Lanelle Bal, MD.   History of Present Illness:     Patient returns to the office today, patient is now about 5 months postop after coronary artery bypass grafting. He had persistent cough which now has Improved. He did not start cardiac rehabilitation. He has had no recurrent angina or evidence of congestive heart failure.   Past Medical History:  Diagnosis Date  . Anginal pain ([place])    occ  . Arthritis   . CAD (coronary artery disease)    a. Anterior STEMI 2005 c/b vfib arrest, PCI to LAD at [facility]. b. Stent thrombosis 2006 with DES within prior LAD stent at [facility]. C. 09/2012: s/p balloon angioplasty to mLAD for severe stenosis in previously stented segment & DES to LCx; initial enz neg but ruled in for NSTEMI after post-cath vagal sx, felt d/t distal emboliz of thrombus during case.  . Elevated hemidiaphragm    a. Noted 09/2012 - instructed to f/u PCP.  Marland Kitchen GERD (gastroesophageal reflux disease)   . Heart murmur   . History of kidney stones   . Hypertension   . Ischemic cardiomyopathy    a. Unclear prior EF but pt was told heart was weakened in past. b. EF normal 09/2012.  Marland Kitchen Myocardial infarction   . Shortness of  breath dyspnea   . Ventricular fibrillation ([place])    a. VF arrest 2005 in setting of STEMI.  . Wears glasses      History  Smoking Status  . Former Smoker  . Packs/day: 0.50  . Years: 10.00  . Types: Cigars, Cigarettes  . Quit date: 05/30/2002  Smokeless Tobacco  . Never Used    History  Alcohol Use No     Allergies  Allergen Reactions  . Altace [Ramipril] Shortness Of Breath and Other (See Comments)    cough  . Penicillins Swelling    Has patient had a PCN reaction causing immediate rash, facial/tongue/throat swelling, SOB or lightheadedness with hypotension:unsure Has patient had a PCN reaction causing severe rash involving mucus membranes or skin necrosis:unsure Has patient had a PCN reaction that required hospitalization:No Has patient had a PCN reaction occurring within the last 10 years:NO If all of the above answers are "NO", then may proceed with Cephalosporin use.  Childhood reaction      Current Outpatient Prescriptions  Medication Sig Dispense Refill  . acetaminophen (TYLENOL) 500 MG tablet Take 500 mg by mouth every 6 (six) hours as needed (PAIN).     Marland Kitchen aspirin 81 MG tablet Take 1 tablet (81 mg total) by mouth daily.    . cetirizine (ZYRTEC) 10 MG tablet Take 10 mg by mouth daily.    . cholecalciferol (VITAMIN D) 1000 UNITS tablet Take 2,000  Units by mouth daily.     . clopidogrel (PLAVIX) 75 MG tablet Take 75 mg by mouth at bedtime.     . Coenzyme Q10 (COQ10) 100 MG CAPS Take 200 mg by mouth daily.     . fluticasone (FLONASE) 50 MCG/ACT nasal spray Place 2 sprays into both nostrils daily. 16 g 4  . Melatonin 5 MG TABS Take 1 tablet by mouth at bedtime as needed.    . metoprolol succinate (TOPROL-XL) 25 MG 24 hr tablet Take 0.5 tablets (12.5 mg total) by mouth daily. 45 tablet 3  . rosuvastatin (CRESTOR) 10 MG tablet Take 10 mg by mouth at bedtime.    . isosorbide mononitrate (IMDUR) 30 MG 24 hr tablet Take 1 tablet (30 mg total) by mouth daily. 90 tablet 3    No current facility-administered medications for this visit.        Physical Exam: BP 107/68   Pulse 62   Resp 20   Ht 5\' 11"  (1.803 m)   Wt 252 lb (114.3 kg)   SpO2 96% Comment: RA  BMI 35.15 kg/m   General appearance: alert and cooperative Neurologic: intact Heart: regular rate and rhythm, S1, S2 normal, no murmur, click, rub or gallop Lungs: clear to auscultation bilaterally, no wheezing  Abdomen: soft, non-tender; bowel sounds normal; no masses,  no organomegaly Extremities: extremities normal, atraumatic, no cyanosis or edema and Homans sign is negative, no sign of DVT Wound: Sternum is stable and well-healed left radial harvest sites also well-healed with neurovascular intact left hand. Right thigh Endo vein harvest healed evidence of infection   Diagnostic Studies & Laboratory data:     Recent Radiology Findings:   Dg Chest 2 View  Result Date: 08/04/2016 CLINICAL DATA:  CABG EXAM: CHEST  2 VIEW COMPARISON:  06/10/2016 FINDINGS: Prior CABG. Mild elevation of the right hemidiaphragm, stable. No confluent airspace opacities or effusions. Heart is normal size. IMPRESSION: No acute cardiopulmonary disease. Electronically Signed   By: Rolm Baptise M.D.   On: 08/04/2016 10:21      Recent Lab Findings: Lab Results  Component Value Date   WBC 11.6 (H) 06/10/2016   HGB 14.6 06/10/2016   HCT 43.1 06/10/2016   PLT 240.0 06/10/2016   GLUCOSE 98 05/09/2016   CHOL 160 05/09/2016   TRIG 156 (H) 05/09/2016   HDL 53 05/09/2016   LDLCALC 76 05/09/2016   ALT 36 05/09/2016   AST 36 05/09/2016   NA 143 05/09/2016   K 4.4 05/09/2016   CL 101 05/09/2016   CREATININE 1.09 05/09/2016   BUN 19 05/09/2016   CO2 22 05/09/2016   TSH 2.208 03/03/2016   INR 1.67 03/01/2016   HGBA1C 6.2 (H) 02/29/2016      Assessment / Plan:   Coronary artery disease status post CABG and PCI Status post coronary artery bypass grafting-recovery from his coronary artery bypass grafting in  October, the patient has been allowed to increase his physical activity including lifting and return to playing golf.  Ischemic cardiomyopathy Pre-CABG LVEF was low normal at 50-55%.   Paroxysmal atrial fibrillation Postoperative course following CABG complicated by atrial fibrillation.  Hypertension Blood pressure 107/68 today. Remains on Toprol Hyperlipidemia Patient on continues on Crestor    Grace Isaac MD      [address] [place] [address] Office [phone]   Beeper [phone]  08/04/2016 10:33 AM

## 2016-08-17 ENCOUNTER — Other Ambulatory Visit: Payer: Self-pay | Admitting: Cardiology

## 2016-10-07 DIAGNOSIS — Z029 Encounter for administrative examinations, unspecified: Secondary | ICD-10-CM

## 2016-11-18 ENCOUNTER — Ambulatory Visit: Payer: Medicare HMO | Admitting: Internal Medicine

## 2016-11-24 ENCOUNTER — Ambulatory Visit (INDEPENDENT_AMBULATORY_CARE_PROVIDER_SITE_OTHER): Payer: Medicare HMO | Admitting: Internal Medicine

## 2016-11-24 ENCOUNTER — Encounter: Payer: Self-pay | Admitting: Internal Medicine

## 2016-11-24 VITALS — BP 120/80 | HR 60 | Ht 71.0 in | Wt 257.0 lb

## 2016-11-24 DIAGNOSIS — I255 Ischemic cardiomyopathy: Secondary | ICD-10-CM

## 2016-11-24 DIAGNOSIS — I10 Essential (primary) hypertension: Secondary | ICD-10-CM | POA: Diagnosis not present

## 2016-11-24 DIAGNOSIS — I2510 Atherosclerotic heart disease of native coronary artery without angina pectoris: Secondary | ICD-10-CM | POA: Diagnosis not present

## 2016-11-24 DIAGNOSIS — E782 Mixed hyperlipidemia: Secondary | ICD-10-CM

## 2016-11-24 DIAGNOSIS — I25118 Atherosclerotic heart disease of native coronary artery with other forms of angina pectoris: Secondary | ICD-10-CM | POA: Diagnosis not present

## 2016-11-24 LAB — BASIC METABOLIC PANEL
BUN/Creatinine Ratio: 16 (ref 10–24)
BUN: 16 mg/dL (ref 8–27)
CALCIUM: 9.8 mg/dL (ref 8.6–10.2)
CO2: 19 mmol/L — AB (ref 20–29)
CREATININE: 1.01 mg/dL (ref 0.76–1.27)
Chloride: 104 mmol/L (ref 96–106)
GFR, EST AFRICAN AMERICAN: 89 mL/min/{1.73_m2} (ref >59)
GFR, EST NON AFRICAN AMERICAN: 77 mL/min/{1.73_m2} (ref >59)
Glucose: 121 mg/dL — ABNORMAL HIGH (ref 65–99)
Potassium: 4.6 mmol/L (ref 3.5–5.2)
Sodium: 139 mmol/L (ref 134–144)

## 2016-11-24 MED ORDER — ROSUVASTATIN CALCIUM 20 MG PO TABS: 20.0000 mg | ORAL_TABLET | Freq: Every day | ORAL | 3 refills | Status: DC

## 2016-11-24 NOTE — Patient Instructions (Signed)
Medication Instructions:  Your physician has recommended you make the following change in your medication:  Increase Rosuvastatin to 20 mg by mouth daily.    Labwork: Lab work to be done today--BMP  Your physician recommends that you return for lab work on September 25,2018.  This will be fasting (lipid and ALT).  The lab opens at 7:30 AM   Testing/Procedures: none  Follow-Up: Your physician wants you to follow-up in: 6 months.  You will receive a reminder letter in the mail two months in advance. If you don't receive a letter, please call our office to schedule the follow-up appointment.   Any Other Special Instructions Will Be Listed Below (If Applicable).     If you need a refill on your cardiac medications before your next appointment, please call your pharmacy.

## 2016-11-24 NOTE — Progress Notes (Signed)
Follow-up Outpatient Visit Date: 11/24/2016  Primary Care Provider: Timmothy Euler, MD [place] [address]  Chief Complaint: Follow-up coronary artery disease  HPI:  Hayden Rivera is a 66 y.o. year-old male with history of coronary artery disease status post multiple PCI's and subsequent CABG in 02/2016, postoperative atrial fibrillation, ischemic cardiomyopathy with low normal LVEF, hypertension, and hyperlipidemia, who presents for follow-up of coronary artery disease. I last saw him on 07/21/16, which time he was doing well. Today, his only complaint is of tingling about the left radial artery harvest site, which was used part of his CABG. He has not had any chest pain. He describes some nasal congestion that at times makes him feel short of breath. However, when breathing through his mouth or clearing his nose, his dyspnea resolves. He does not have any exertional symptoms. He has had rare episodes of brief flutters that he describes as a skipped beat. No irregular heartbeats or sustained palpitations to suggest recurrent atrial fibrillation. He has chronic arthralgias that he attributes to his history of Lyme disease. He is tolerating his current medications well. He remains on dual antiplatelet therapy with aspirin and clopidogrel without significant bleeding. He denies lightheadedness and edema. Since our last visit, losartan was discontinued. Patient and his wife do not know specifics as to why this was done.  --------------------------------------------------------------------------------------------------  Cardiovascular History & Procedures: Cardiovascular Problems:  CAD status post PCI and CABG  Ischemic cardiomyopathy (most recent LVEF 50-55%)  Postoperative atrial fibrillation  Risk Factors:  Known CAD, hypertension, hyperlipidemia, obesity, male gender, and age greater than 57  Cath/PCI:  LHC (02/17/16): LMCA was 50% distal stenosis. LAD heavily calcified  with 40% proximal disease before previous least placed stent. Mid LAD stent with 30% ISR. Ramus with 90% ostial stenosis. LCx with 95% ostial stenosis. LCx stent is patent. RCA with luminal irregularities.  CV Surgery:  CABG (03/03/16, Dr. Servando Snare): LIMA to LAD, left radial to ramus, and SVG to LCx.  EP Procedures and Devices:  None  Non-Invasive Evaluation(s):  TTE (02/29/16): Normal LV size with mild concentric LVH. LVEF 50-55% with mid anteroseptal, apical septal and apical anterior akinesis. Normal LV diastolic function. Mild aortic stenosis. Mild MR. Normal RV size and function.  Recent CV Pertinent Labs: Lab Results  Component Value Date   CHOL 160 05/09/2016   HDL 53 05/09/2016   LDLCALC 76 05/09/2016   TRIG 156 (H) 05/09/2016   CHOLHDL 3.0 05/09/2016   CHOLHDL 3.4 07/16/2015   INR 1.67 03/01/2016   K 4.4 05/09/2016   MG 2.3 03/04/2016   BUN 19 05/09/2016   CREATININE 1.09 05/09/2016   CREATININE 1.00 02/12/2016    Past medical and surgical history were reviewed and updated in EPIC.  Current Meds  Medication Sig  . acetaminophen (TYLENOL) 500 MG tablet Take 500 mg by mouth every 6 (six) hours as needed (PAIN).   Marland Kitchen aspirin 81 MG tablet Take 1 tablet (81 mg total) by mouth daily.  . cetirizine (ZYRTEC) 10 MG tablet Take 10 mg by mouth daily as needed.   . cholecalciferol (VITAMIN D) 1000 UNITS tablet Take 2,000 Units by mouth daily.   . clopidogrel (PLAVIX) 75 MG tablet Take 75 mg by mouth daily.  . Coenzyme Q10 (COQ10) 100 MG CAPS Take 200 mg by mouth daily.   . fluticasone (FLONASE) 50 MCG/ACT nasal spray Place 2 sprays into both nostrils daily.  . isosorbide mononitrate (IMDUR) 30 MG 24 hr tablet Take 30  mg by mouth daily.   . metoprolol succinate (TOPROL-XL) 25 MG 24 hr tablet Take 0.5 tablets (12.5 mg total) by mouth daily.  . rosuvastatin (CRESTOR) 10 MG tablet Take 10 mg by mouth at bedtime.  . [DISCONTINUED] losartan (COZAAR) 100 MG tablet Take 100 mg by  mouth daily.    Allergies: Altace [ramipril] and Penicillins  Social History   Social History  . Marital status: Married    Spouse name: N/A  . Number of children: N/A  . Years of education: N/A   Occupational History  . retired    Social History Main Topics  . Smoking status: Former Smoker    Packs/day: 0.50    Years: 10.00    Types: Cigars, Cigarettes    Quit date: 05/30/2002  . Smokeless tobacco: Never Used  . Alcohol use No  . Drug use: No  . Sexual activity: Not Currently   Other Topics Concern  . Not on file   Social History Narrative  . No narrative on file    Family History  Problem Relation Age of Onset  . Heart disease Mother   . Stroke Father   . Healthy Brother     Review of Systems: Single episode of vertigo lasting one day last week. Otherwise, a 12-system review of systems was performed and was negative except as noted in the HPI.  --------------------------------------------------------------------------------------------------  Physical Exam: BP 120/80   Pulse 60   Ht 5\' 11"  (1.803 m)   Wt 257 lb (116.6 kg)   BMI 35.84 kg/m   General:  Obese man, seated comfortably in the exam room. He is accompanied by his wife. HEENT: No conjunctival pallor or scleral icterus. Moist mucous membranes.  OP clear. Neck: Supple without lymphadenopathy, thyromegaly, JVD, or HJR. No carotid bruit. Lungs: Normal work of breathing. Clear to auscultation bilaterally without wheezes or crackles. Heart: Regular rate and rhythm without murmurs, rubs, or gallops. Non-displaced PMI. Abd: Bowel sounds present. Soft, NT/ND without hepatosplenomegaly Ext: No lower extremity edema. 2+ right radial and bilateral pedal pulses. Left radial artery is surgically absent. Harvest site is well-healed. Skin: Warm and dry without rash.  Lab Results  Component Value Date   WBC 11.6 (H) 06/10/2016   HGB 14.6 06/10/2016   HCT 43.1 06/10/2016   MCV 90.8 06/10/2016   PLT 240.0  06/10/2016    Lab Results  Component Value Date   NA 143 05/09/2016   K 4.4 05/09/2016   CL 101 05/09/2016   CO2 22 05/09/2016   BUN 19 05/09/2016   CREATININE 1.09 05/09/2016   GLUCOSE 98 05/09/2016   ALT 36 05/09/2016    Lab Results  Component Value Date   CHOL 160 05/09/2016   HDL 53 05/09/2016   LDLCALC 76 05/09/2016   TRIG 156 (H) 05/09/2016   CHOLHDL 3.0 05/09/2016    --------------------------------------------------------------------------------------------------  ASSESSMENT AND PLAN: Coronary artery disease with stable angina Mr. Amon is doing well following CABG last October. He has not had any recurrent chest pain or significant shortness of breath. We will continue his current antianginal therapy consisting of metoprolol and isosorbide mononitrate. We will also continue with secondary prevention, including escalation of rosuvastatin, as below.  Ischemic cardiomyopathy Mr. Arora appears euvolemic and well compensated with NYHA class room 1-II symptoms. No medication changes at this time. If his EF were drop in the future, reinitiation of an ARB will need to be considered.  Hyperlipidemia Lipids have been slightly above goal with LDL of 76 on  last check in 04/2016. We have agreed to increase rosuvastatin 20 mg daily and repeat a lipid panel and ALT in 3 months.  Hypertension Blood pressure is normal at this time despite discontinuation of losartan. No medication changes at this time.  Follow-up: Return to clinic in 6 months.  Hayden Bush, MD 11/24/2016 9:39 AM

## 2016-12-14 DIAGNOSIS — R3121 Asymptomatic microscopic hematuria: Secondary | ICD-10-CM | POA: Diagnosis not present

## 2016-12-14 DIAGNOSIS — N5201 Erectile dysfunction due to arterial insufficiency: Secondary | ICD-10-CM | POA: Diagnosis not present

## 2016-12-14 DIAGNOSIS — N200 Calculus of kidney: Secondary | ICD-10-CM | POA: Diagnosis not present

## 2017-01-03 DIAGNOSIS — N200 Calculus of kidney: Secondary | ICD-10-CM | POA: Diagnosis not present

## 2017-02-21 ENCOUNTER — Other Ambulatory Visit: Payer: Medicare HMO

## 2017-02-21 DIAGNOSIS — E782 Mixed hyperlipidemia: Secondary | ICD-10-CM

## 2017-02-21 DIAGNOSIS — I25118 Atherosclerotic heart disease of native coronary artery with other forms of angina pectoris: Secondary | ICD-10-CM | POA: Diagnosis not present

## 2017-02-21 LAB — LIPID PANEL
CHOLESTEROL TOTAL: 121 mg/dL (ref 100–199)
Chol/HDL Ratio: 2.6 ratio (ref 0.0–5.0)
HDL: 46 mg/dL (ref >39)
LDL CALC: 49 mg/dL (ref 0–99)
Triglycerides: 129 mg/dL (ref 0–149)
VLDL CHOLESTEROL CAL: 26 mg/dL (ref 5–40)

## 2017-02-21 LAB — ALT: ALT: 43 IU/L (ref 0–44)

## 2017-03-01 IMAGING — DX DG CHEST 2V
2 series · 2 of 2 positions shown · non-contrast
Comparison: 03/04/2016

CLINICAL DATA: Status post CABG.

EXAM:
CHEST  2 VIEW

[chest pa]
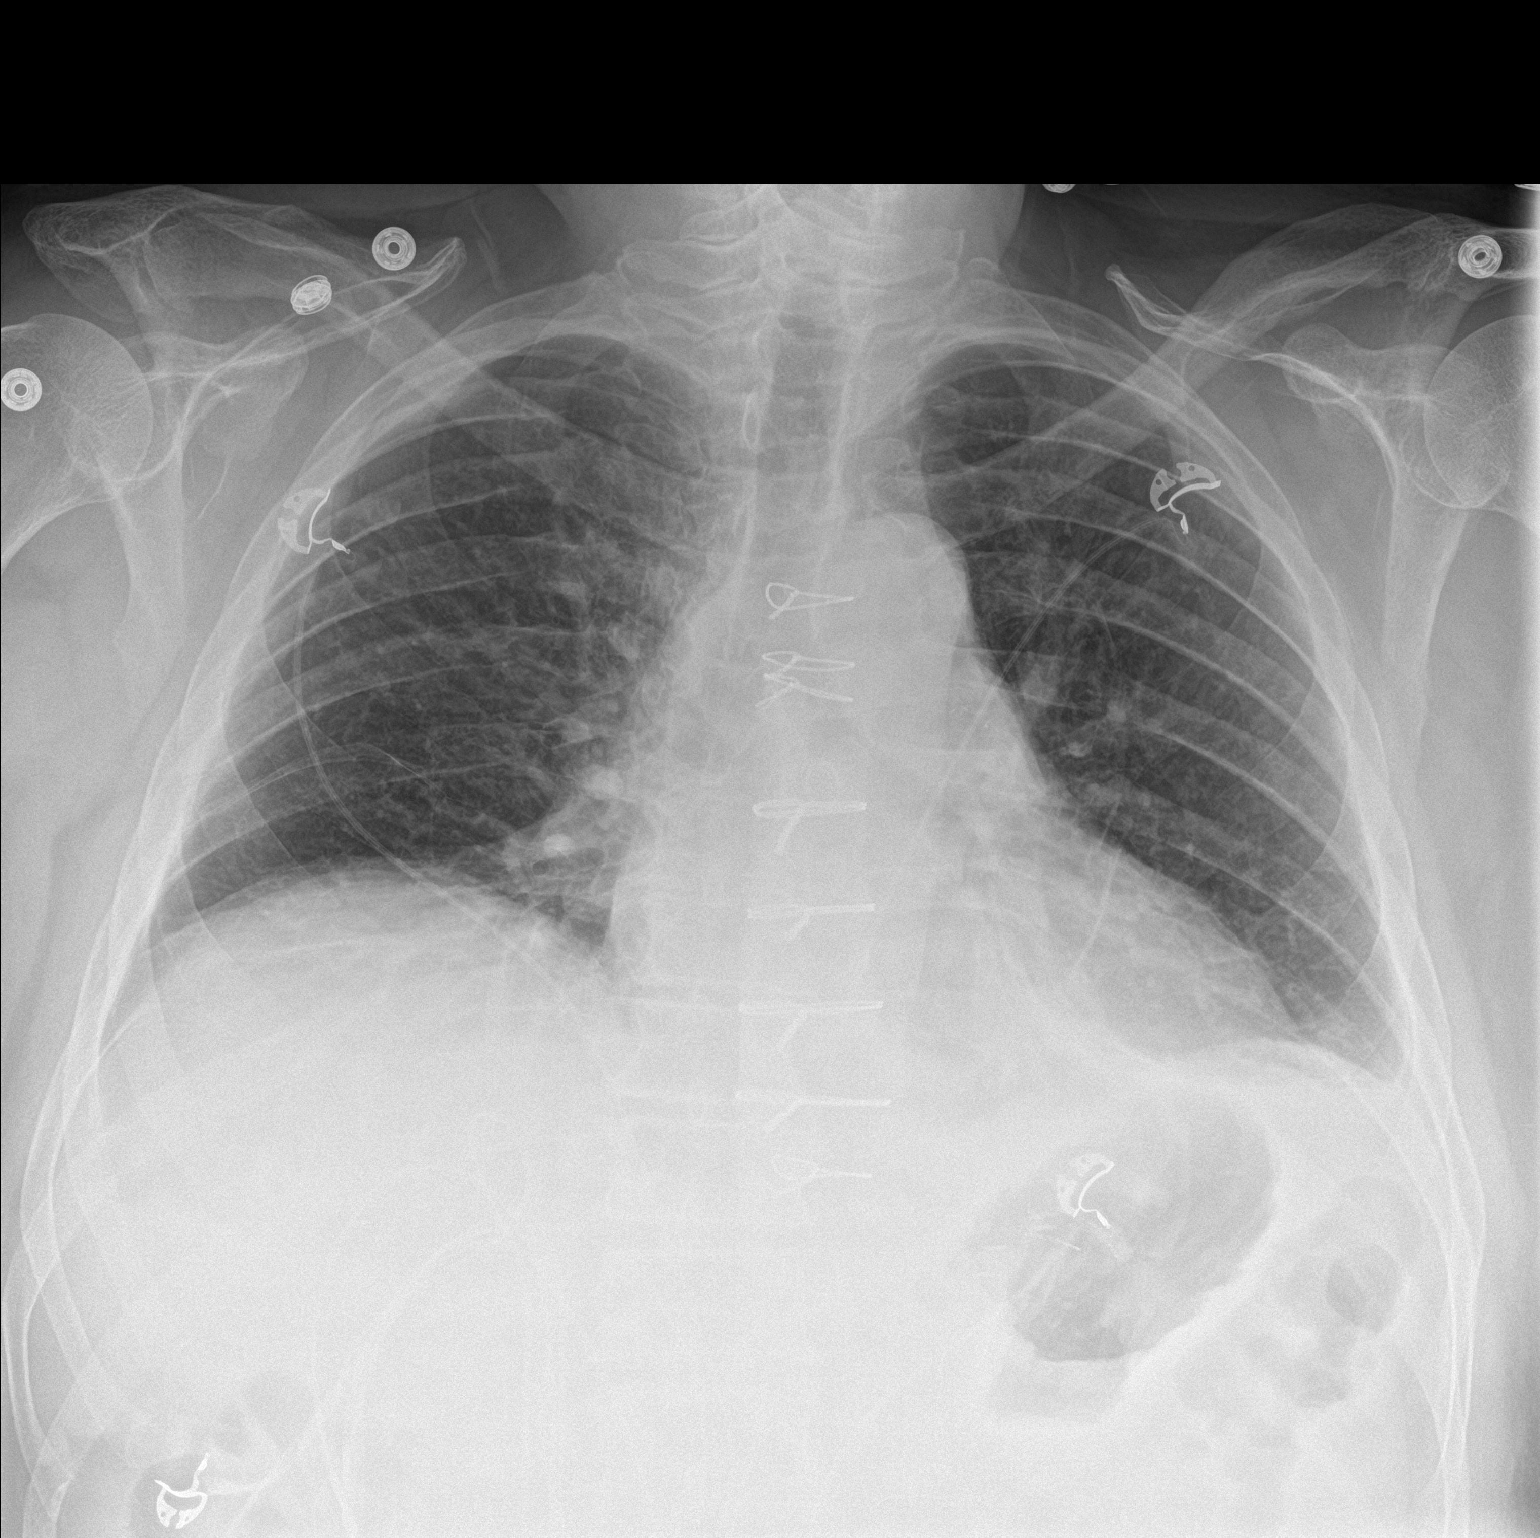

[chest lat]
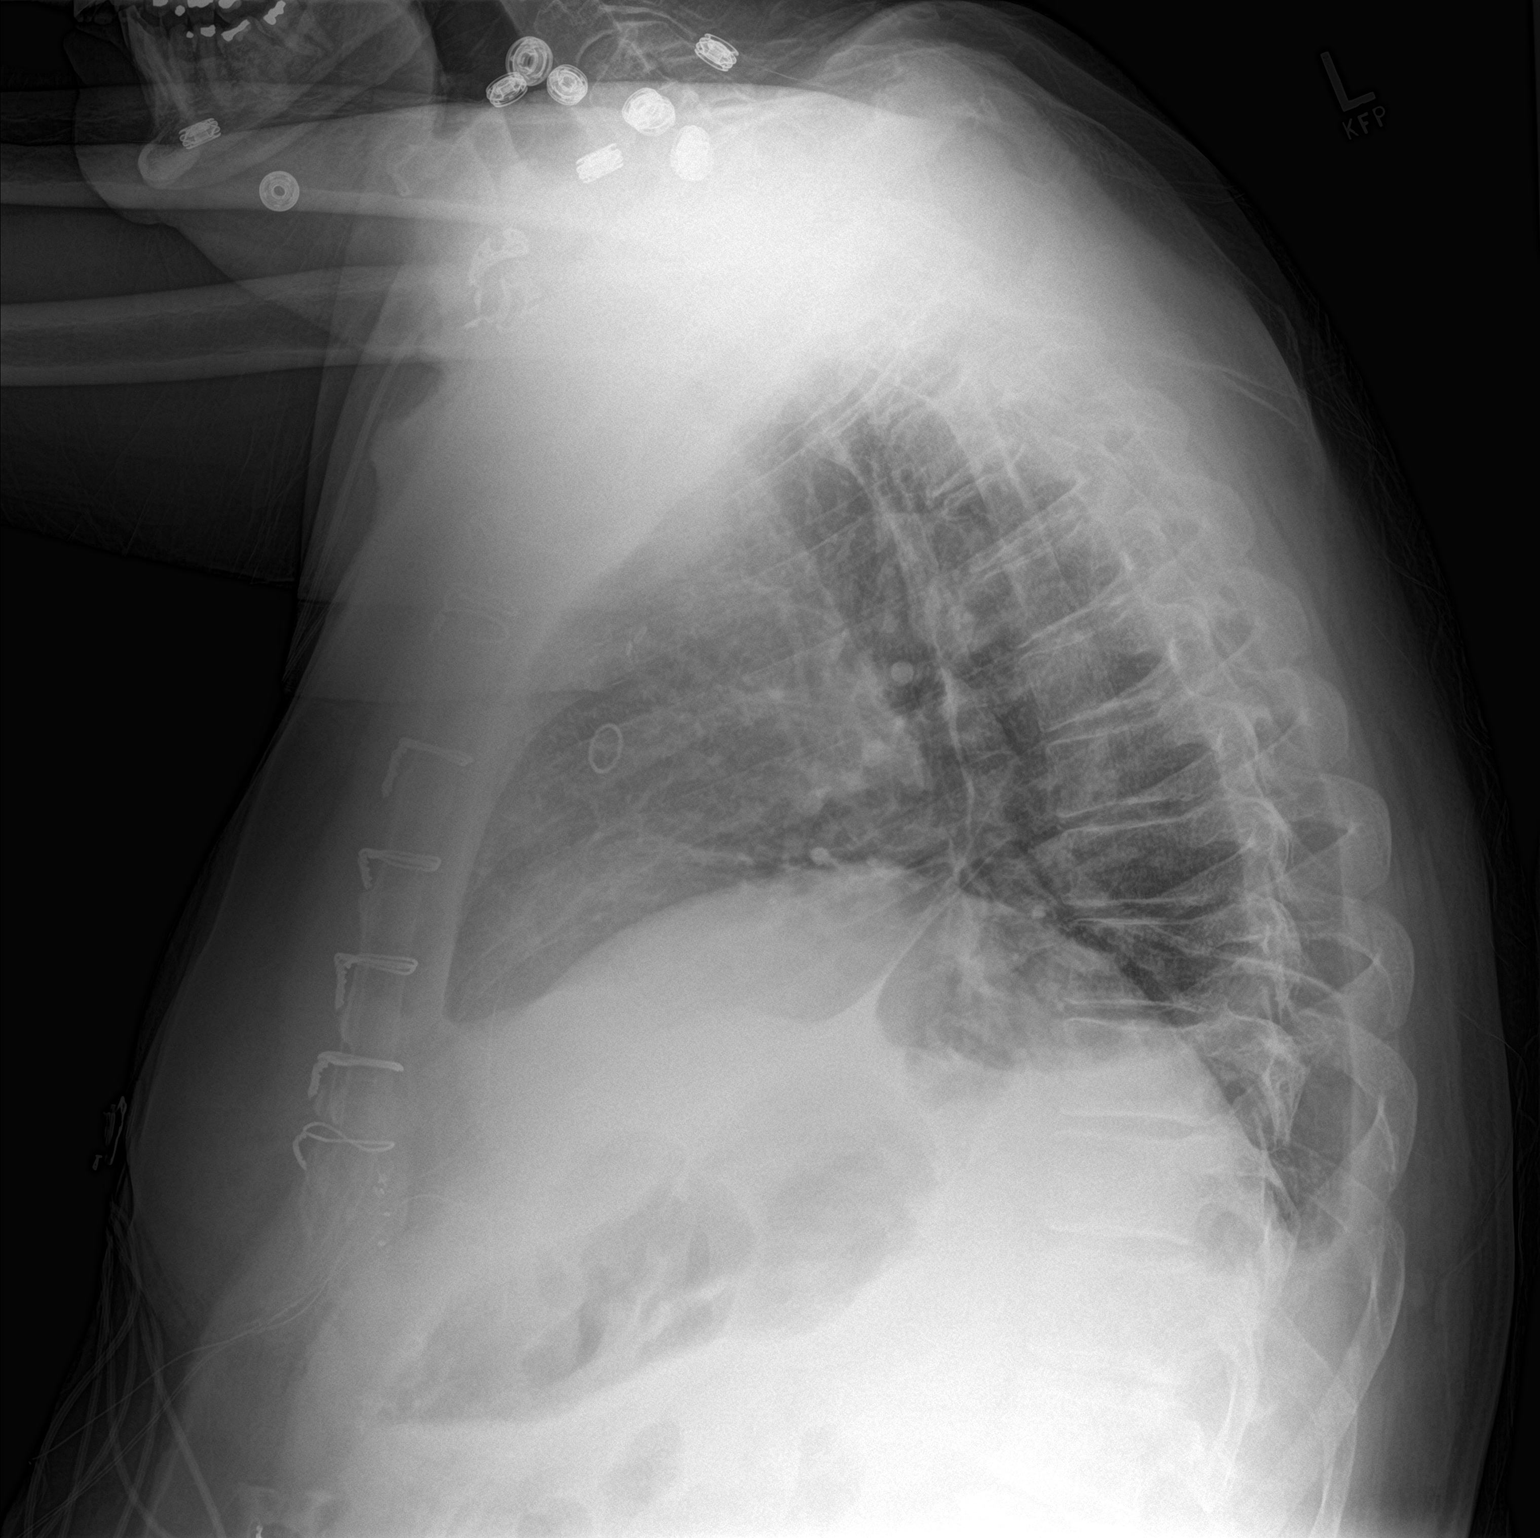

[2 of 2 positions shown; findings below may reference images not displayed]

FINDINGS: Right jugular sheath has been removed. Lungs show improved aeration
bilaterally with mild bibasilar atelectasis remaining. No
pneumothorax or edema. No significant pleural fluid. The heart size
and mediastinal contours are stable.
IMPRESSION: Improved bilateral pulmonary aeration with mild bibasilar
atelectasis remaining. No pneumothorax.

## 2017-04-03 IMAGING — CR DG CHEST 2V
2 series · 2 of 2 positions shown · non-contrast
Comparison: 03/15/2016

CLINICAL DATA: CABG

EXAM:
CHEST  2 VIEW

[w chest pa]
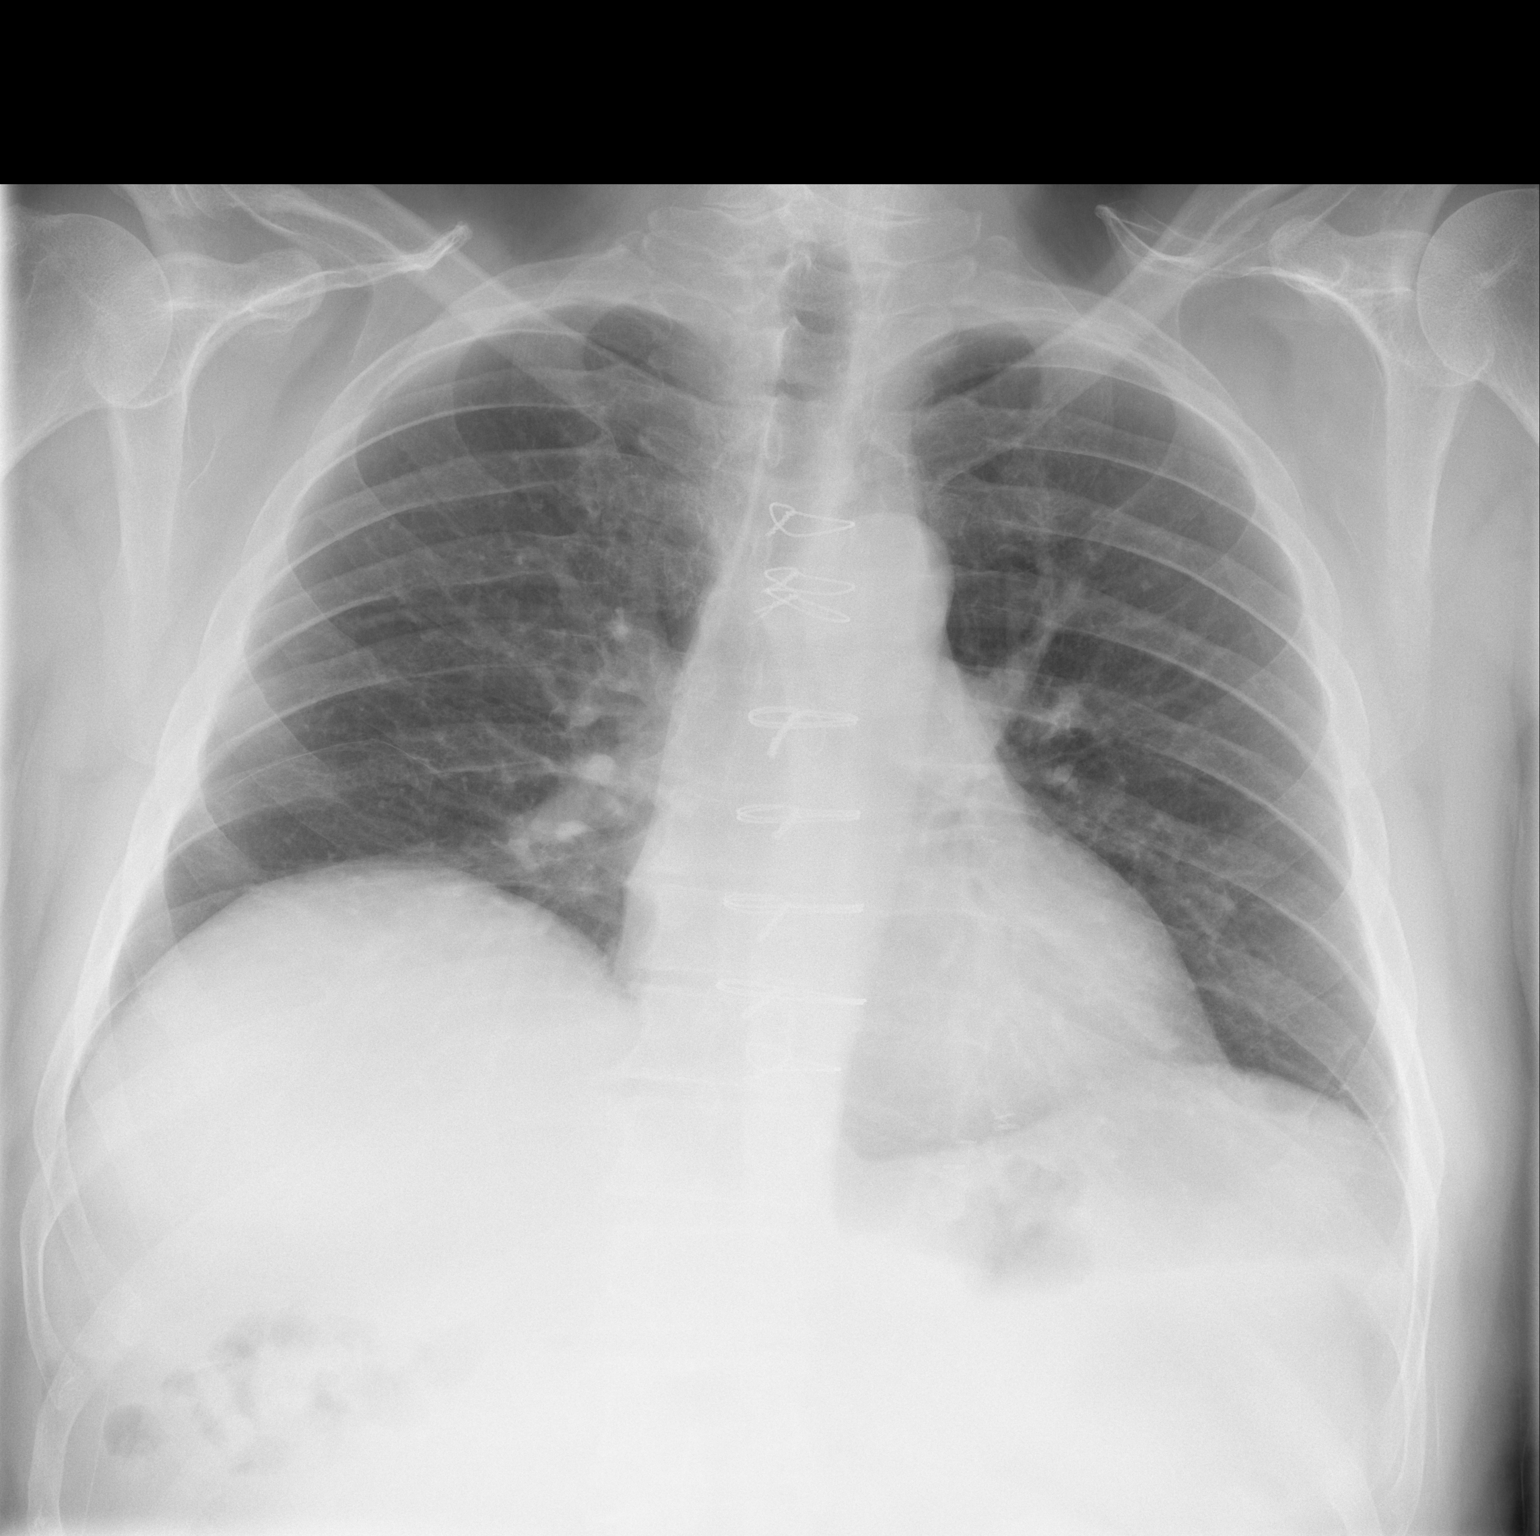

[w chest lat]
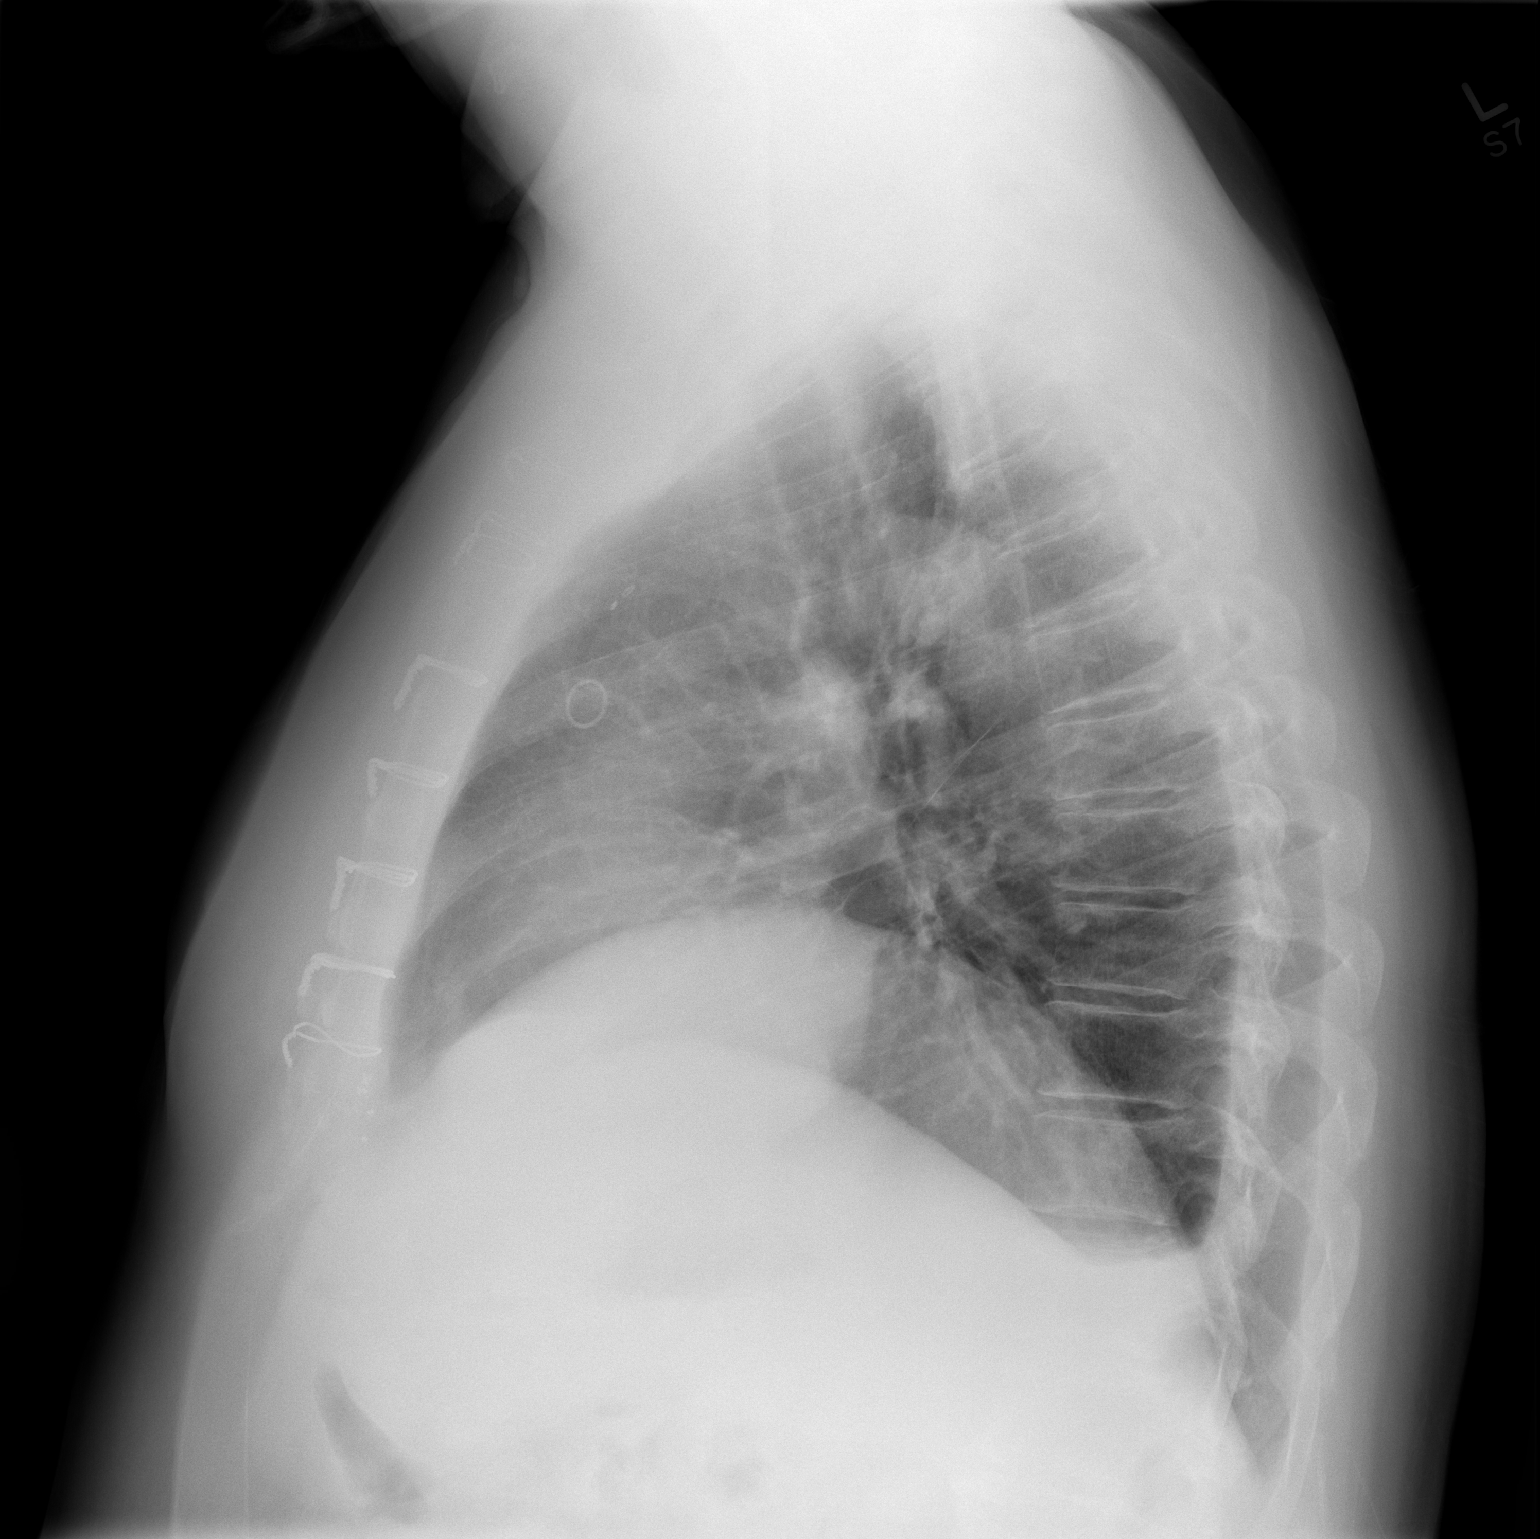

[2 of 2 positions shown; findings below may reference images not displayed]

FINDINGS: Elevated right hemidiaphragm with mild right lower lobe atelectasis
which is improved.

Small left pleural effusion unchanged. Negative for heart failure or
edema. Negative for pneumonia.
IMPRESSION: Right lower lobe atelectasis shows mild interval improvement

Small left effusion unchanged.

## 2017-04-10 ENCOUNTER — Encounter: Payer: Self-pay | Admitting: Family

## 2017-04-10 ENCOUNTER — Telehealth: Payer: Self-pay | Admitting: Internal Medicine

## 2017-04-10 ENCOUNTER — Ambulatory Visit: Payer: Medicare HMO | Admitting: Family

## 2017-04-10 VITALS — BP 139/78 | HR 67 | Temp 97.6°F | Ht 71.0 in | Wt 263.0 lb

## 2017-04-10 DIAGNOSIS — M7071 Other bursitis of hip, right hip: Secondary | ICD-10-CM

## 2017-04-10 MED ORDER — NAPROXEN 500 MG PO TABS: 500.0000 mg | ORAL_TABLET | Freq: Two times a day (BID) | ORAL | 1 refills | Status: DC

## 2017-04-10 MED ORDER — KETOROLAC TROMETHAMINE 60 MG/2ML IM SOLN
60.0000 mg | Freq: Once | INTRAMUSCULAR | Status: AC
  Administered 2017-04-10: 60 mg via INTRAMUSCULAR

## 2017-04-10 MED ORDER — METHYLPREDNISOLONE ACETATE 80 MG/ML IJ SUSP
80.0000 mg | Freq: Once | INTRAMUSCULAR | Status: AC
  Administered 2017-04-10: 80 mg via INTRAMUSCULAR

## 2017-04-10 NOTE — Patient Instructions (Signed)
Hip Bursitis Hip bursitis is inflammation of a fluid-filled sac (bursa) in the hip joint. The bursa protects the bones in the hip joint from rubbing against each other. Hip bursitis can cause mild to moderate pain, and symptoms often come and go over time. What are the causes? This condition may be caused by:  Injury to the hip.  Overuse of the muscles that surround the hip joint.  Arthritis or gout.  Diabetes.  Thyroid disease.  Cold weather.  Infection.  In some cases, the cause may not be known. What are the signs or symptoms? Symptoms of this condition may include:  Mild or moderate pain in the hip area. Pain may get worse with movement.  Tenderness and swelling of the hip, especially on the outer side of the hip.  Symptoms may come and go. If the bursa becomes infected, you may have the following symptoms:  Fever.  Red skin and a feeling of warmth in the hip area.  How is this diagnosed? This condition may be diagnosed based on:  A physical exam.  Your medical history.  X-rays.  Removal of fluid from your inflamed bursa for testing (biopsy).  You may be sent to a health care provider who specializes in bone diseases (orthopedist) or a provider who specializes in joint inflammation (rheumatologist). How is this treated? This condition is treated by resting, raising (elevating), and applying pressure(compression) to the injured area. In some cases, this may be enough to make your symptoms go away. Treatment may also include:  Crutches.  Antibiotic medicine.  Draining fluid out of the bursa to help relieve swelling.  Injecting medicine that helps to reduce inflammation (cortisone).  Follow these instructions at home: Medicines  Take over-the-counter and prescription medicines only as told by your health care provider.  Do not drive or operate heavy machinery while taking prescription pain medicine, or as told by your health care provider.  If you were  prescribed an antibiotic, take it as told by your health care provider. Do not stop taking the antibiotic even if you start to feel better. Activity  Return to your normal activities as told by your health care provider. Ask your health care provider what activities are safe for you.  Rest and protect your hip as much as possible until your pain and swelling get better. General instructions  Wear compression wraps only as told by your health care provider.  Elevate your hip above the level of your heart as much as you can without pain. To do this, try putting a pillow under your hips while you lie down.  Do not use your hip to support your body weight until your health care provider says that you can. Use crutches as told by your health care provider.  Gently massage and stretch your injured area as often as is comfortable.  Keep all follow-up visits as told by your health care provider. This is important. How is this prevented?  Exercise regularly, as told by your health care provider.  Warm up and stretch before being active.  Cool down and stretch after being active.  If an activity irritates your hip or causes pain, avoid the activity as much as possible.  Avoid sitting down for long periods at a time. Contact a health care provider if:  You have a fever.  You develop new symptoms.  You have difficulty walking or doing everyday activities.  You have pain that gets worse or does not get better with medicine.  You   develop red skin or a feeling of warmth in your hip area. Get help right away if:  You cannot move your hip.  You have severe pain. This information is not intended to replace advice given to you by your health care provider. Make sure you discuss any questions you have with your health care provider. Document Released: 11/05/2001 Document Revised: 10/22/2015 Document Reviewed: 12/16/2014 Elsevier Interactive Patient Education  2018 [employer]  

## 2017-04-10 NOTE — Telephone Encounter (Signed)
Discussed lab results from September 2018 with pt, he did not read My Chart message from Dr End.

## 2017-04-10 NOTE — Telephone Encounter (Signed)
New Message     Pt calling to check on blood work results

## 2017-04-10 NOTE — Progress Notes (Signed)
   Subjective:    Patient ID: Hayden Rivera, male    DOB: [birth year], 66 y.o.   MRN: [id number]  Leg Pain   The incident occurred 12 to 24 hours ago. There was no injury mechanism. The pain is present in the right thigh. The quality of the pain is described as aching. The pain is at a severity of 9/10. The pain is moderate. The pain has been constant since onset. Pertinent negatives include no numbness or tingling. He reports no foreign bodies present. The symptoms are aggravated by movement and weight bearing. He has tried nothing for the symptoms. The treatment provided no relief.      Review of Systems  Musculoskeletal: Positive for arthralgias.  Neurological: Negative for tingling and numbness.  All other systems reviewed and are negative.      Objective:   Physical Exam  Constitutional: He is oriented to person, place, and time. He appears well-developed and well-nourished. No distress.  HENT:  Head: Normocephalic.  Eyes: Pupils are equal, round, and reactive to light. Right eye exhibits no discharge. Left eye exhibits no discharge.  Neck: Normal range of motion. Neck supple. No thyromegaly present.  Cardiovascular: Normal rate, regular rhythm, normal heart sounds and intact distal pulses.  No murmur heard. Pulmonary/Chest: Effort normal and breath sounds normal. No respiratory distress. He has no wheezes.  Abdominal: Soft. Bowel sounds are normal. He exhibits no distension. There is no tenderness.  Musculoskeletal: He exhibits tenderness. He exhibits no edema.  Right lateral hip, pain with internal rotation and walking  Neurological: He is alert and oriented to person, place, and time.  Skin: Skin is warm and dry. No rash noted. No erythema.  Psychiatric: He has a normal mood and affect. His behavior is normal. Judgment and thought content normal.  Vitals reviewed.     BP 139/78   Pulse 67   Temp 97.6 F (36.4 C) (Oral)   Ht 5\' 11"  (1.803 m)   Wt 263 lb (119.3 kg)    BMI 36.68 kg/m      Assessment & Plan:  1. Bursitis of right hip, unspecified bursa Rest Ice Elevate ROM exercises discussed RTO prn  - methylPREDNISolone acetate (DEPO-MEDROL) injection 80 mg - ketorolac (TORADOL) injection 60 mg - naproxen (NAPROSYN) 500 MG tablet; Take 1 tablet (500 mg total) 2 (two) times daily with a meal by mouth.  Dispense: 60 tablet; Refill: [place], FNP

## 2017-04-11 ENCOUNTER — Telehealth: Payer: Self-pay | Admitting: Family Medicine

## 2017-04-11 NOTE — Telephone Encounter (Signed)
Per pt's wife pt is currently taking Plavix  Pharmacist told pt he could not take Naproxen with Plavix With review and advise

## 2017-04-12 ENCOUNTER — Telehealth: Payer: Self-pay | Admitting: Family Medicine

## 2017-04-12 MED ORDER — TRAMADOL HCL 50 MG PO TABS: 50.0000 mg | ORAL_TABLET | Freq: Two times a day (BID) | ORAL | 0 refills | Status: DC | PRN

## 2017-04-12 NOTE — Telephone Encounter (Signed)
Patient was seen on 04-10-17 with Ms. Hawks for bursitis.  Please review and advise on any medication changes.

## 2017-04-12 NOTE — Telephone Encounter (Signed)
Pt can take tylenol OTC instead of naprosyn.

## 2017-04-12 NOTE — Telephone Encounter (Signed)
[facility] Prescription ready for pick up. Can only give pt one month rx, and follow up with PCP. If pain has not improved any or continues he needs to come in to have x-ray.

## 2017-04-12 NOTE — Telephone Encounter (Signed)
Patient aware of recommendation and rx for tramadol called in by [facility].

## 2017-04-12 NOTE — Telephone Encounter (Signed)
Aware, script for pain at front desk.

## 2017-04-12 NOTE — Telephone Encounter (Signed)
He can't take Naprosyn- already using tylenol with no relief is there anything else he can take. He said Alyse Low said he had bursitis and it is painful. She gave him 2 shots

## 2017-04-18 ENCOUNTER — Other Ambulatory Visit: Payer: Self-pay | Admitting: Physician Assistant

## 2017-05-01 IMAGING — CR DG CHEST 2V
2 series · 2 of 2 positions shown · non-contrast
Comparison: Chest x-ray of April 07, 2016

CLINICAL DATA: Two months of productive cough and chest tenderness.
History of CABG on March 01, 2016.

EXAM:
CHEST  2 VIEW

[w chest pa *]
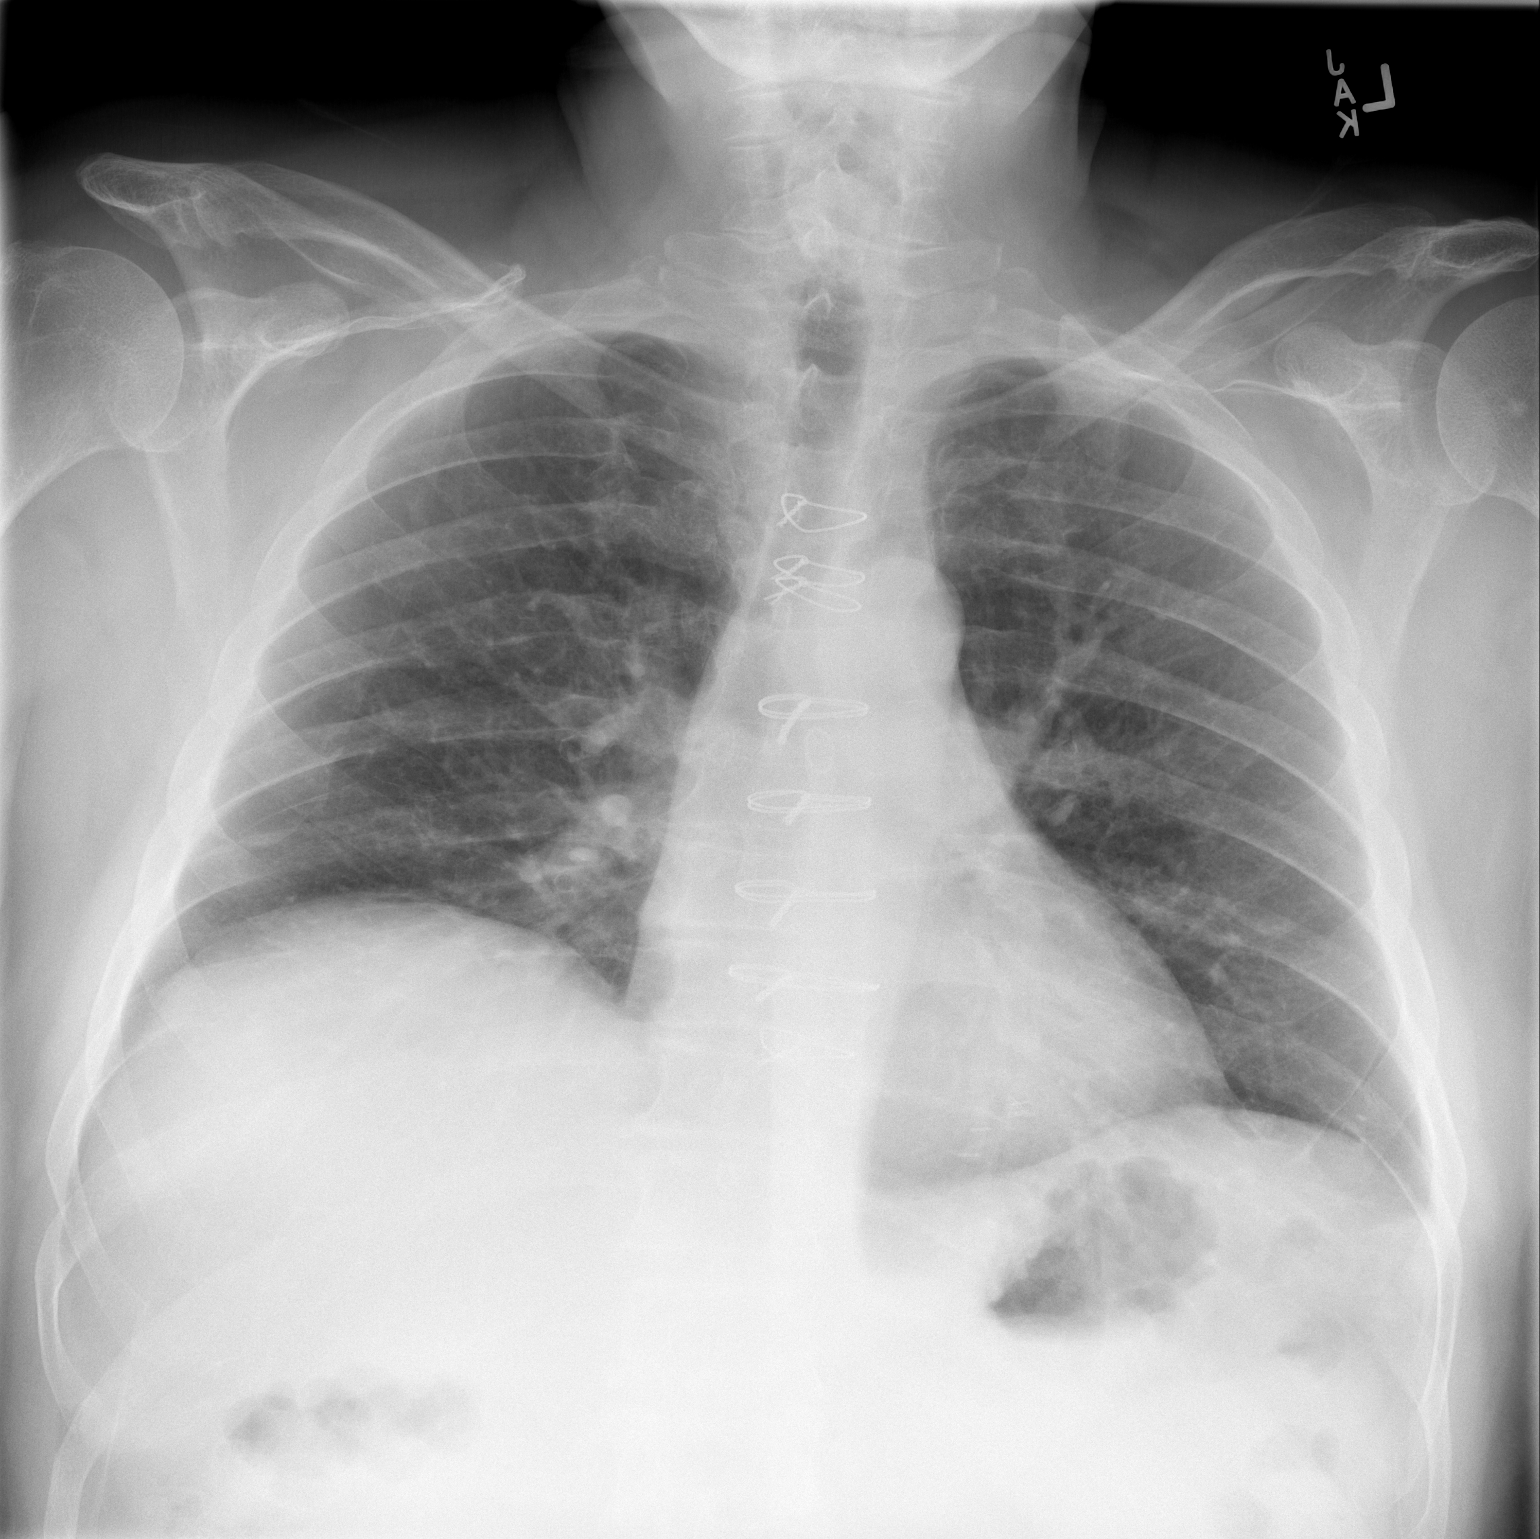

[w chest lat]
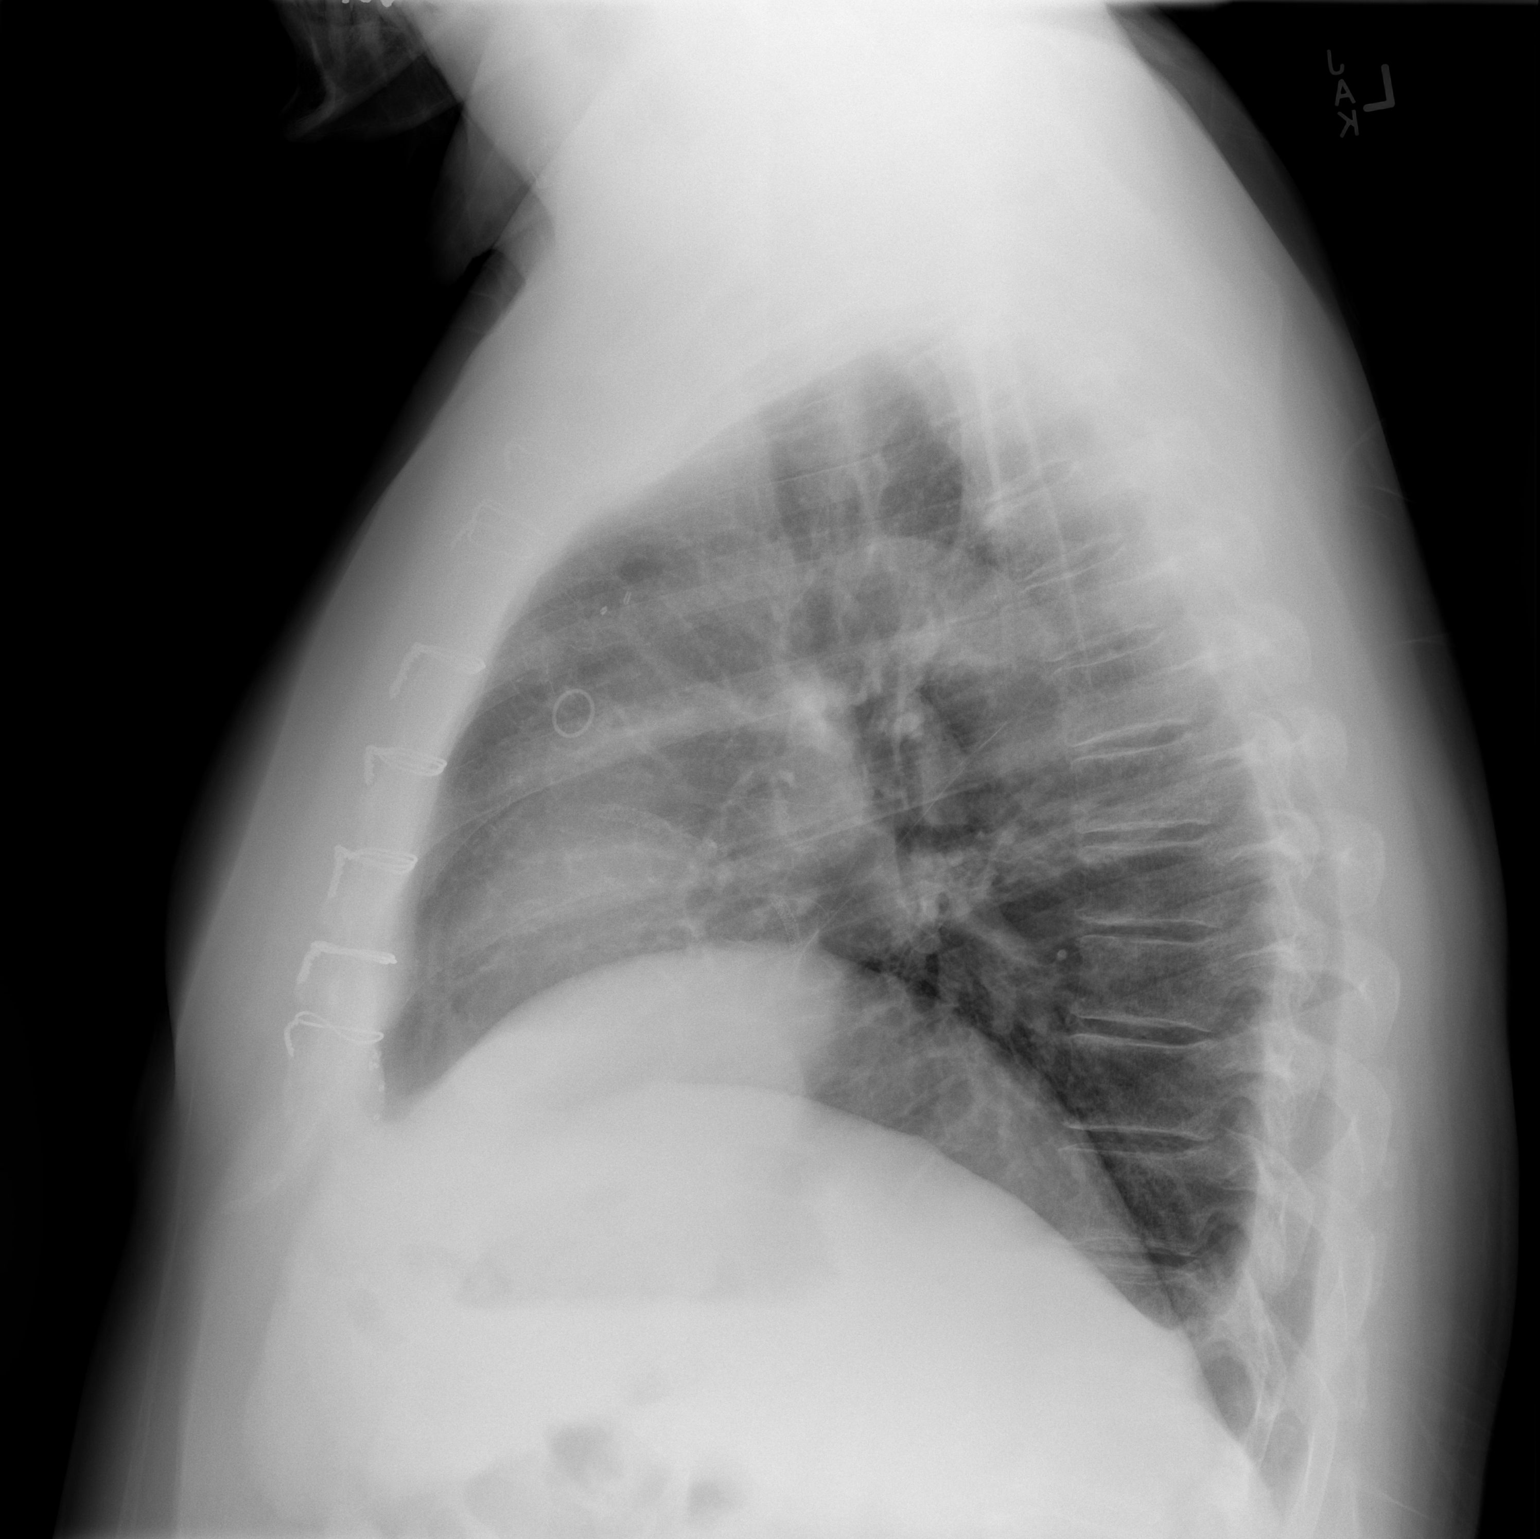

[2 of 2 positions shown; findings below may reference images not displayed]

FINDINGS: The lungs are reasonably well inflated. There is no focal
infiltrate. There is no pleural effusion or pneumothorax. The heart
and pulmonary vascularity are normal. The mediastinum is normal in
width. The sternal wires are intact. The retrosternal soft tissues
are normal. The bony thorax exhibits no acute abnormality.
IMPRESSION: There is no active cardiopulmonary disease. Interval resolution of
the small left pleural effusion.

## 2017-06-08 ENCOUNTER — Encounter: Payer: Self-pay | Admitting: Internal Medicine

## 2017-06-08 ENCOUNTER — Ambulatory Visit (INDEPENDENT_AMBULATORY_CARE_PROVIDER_SITE_OTHER): Payer: Medicare HMO | Admitting: Internal Medicine

## 2017-06-08 VITALS — BP 144/74 | HR 68 | Ht 71.0 in | Wt 264.8 lb

## 2017-06-08 DIAGNOSIS — E785 Hyperlipidemia, unspecified: Secondary | ICD-10-CM

## 2017-06-08 DIAGNOSIS — I255 Ischemic cardiomyopathy: Secondary | ICD-10-CM | POA: Diagnosis not present

## 2017-06-08 DIAGNOSIS — I2510 Atherosclerotic heart disease of native coronary artery without angina pectoris: Secondary | ICD-10-CM | POA: Diagnosis not present

## 2017-06-08 DIAGNOSIS — I350 Nonrheumatic aortic (valve) stenosis: Secondary | ICD-10-CM

## 2017-06-08 DIAGNOSIS — I10 Essential (primary) hypertension: Secondary | ICD-10-CM | POA: Diagnosis not present

## 2017-06-08 LAB — BASIC METABOLIC PANEL
BUN/Creatinine Ratio: 11 (ref 10–24)
BUN: 12 mg/dL (ref 8–27)
CALCIUM: 9.7 mg/dL (ref 8.6–10.2)
CHLORIDE: 105 mmol/L (ref 96–106)
CO2: 22 mmol/L (ref 20–29)
Creatinine, Ser: 1.05 mg/dL (ref 0.76–1.27)
GFR calc Af Amer: 84 mL/min/{1.73_m2} (ref >59)
GFR calc non Af Amer: 73 mL/min/{1.73_m2} (ref >59)
Glucose: 126 mg/dL — ABNORMAL HIGH (ref 65–99)
POTASSIUM: 4.3 mmol/L (ref 3.5–5.2)
Sodium: 144 mmol/L (ref 134–144)

## 2017-06-08 MED ORDER — OLMESARTAN MEDOXOMIL 5 MG PO TABS: ORAL_TABLET | ORAL | 1 refills | Status: DC

## 2017-06-08 NOTE — Progress Notes (Signed)
Follow-up Outpatient Visit Date: 06/08/2017  Primary Care Provider: Timmothy Euler, MD [place] [address]  Chief Complaint: "Feeling cloudy"  HPI:  Hayden Rivera is a 67 y.o. year-old male with history of coronary artery disease status post multiple PCI's and subsequent CABG in 02/2016, postoperative atrial fibrillation, ischemic cardiomyopathy with low normal LVEF, hypertension, and hyperlipidemia, who presents for follow-up of coronary artery disease. I last saw Hayden Rivera in June, at which time he was doing well; stable angina was well controlled with metoprolol and isosorbide mononitrate. We agreed to increase rosuvastatin 20 mg daily to optimize lipid control. Most recent LDL in September was at goal (49).  Today, Hayden Rivera reports feeling relatively well, though he notes "feeling cloudy" over the last several months. He denies shortness of breath but just feels a little bit run down. He does not recall any clear precipitants symptoms are present most days and are not related to certain activity. He feels like he sleeps okay at night and does not nap during the day. Sleep study in 2017 was okay. He has been more sedentary over the last few months due to knee problems as well as bad weather. Symptoms at the time of his prior stents and CABG included chest burning with activity, which he has not felt recently. The only time that he has had any discomfort in his chest is one breathing cold air outside. He denies palpitations, lightheadedness, orthopnea, and edema.  --------------------------------------------------------------------------------------------------  Cardiovascular History & Procedures: Cardiovascular Problems:  CAD status post PCI and CABG  Ischemic cardiomyopathy (most recent LVEF 50-55%)  Postoperative atrial fibrillation  Risk Factors:  Known CAD, hypertension, hyperlipidemia, obesity, male gender, and age greater than 49  Cath/PCI:  LHC  (02/17/16): LMCA was 50% distal stenosis. LAD heavily calcified with 40% proximal disease before previous least placed stent. Mid LAD stent with 30% ISR. Ramus with 90% ostial stenosis. LCx with 95% ostial stenosis. LCx stent is patent. RCA with luminal irregularities.  CV Surgery:  CABG (03/03/16, Dr. Servando Rivera): LIMA to LAD, left radial to ramus, and SVG to LCx.  EP Procedures and Devices:  None  Non-Invasive Evaluation(s):  TTE (02/29/16): Normal LV size with mild concentric LVH. LVEF 50-55% with mid anteroseptal, apical septal and apical anterior akinesis. Normal LV diastolic function. Mild aortic stenosis. Mild MR. Normal RV size and function.  Recent CV Pertinent Labs: Lab Results  Component Value Date   CHOL 121 02/21/2017   HDL 46 02/21/2017   LDLCALC 49 02/21/2017   TRIG 129 02/21/2017   CHOLHDL 2.6 02/21/2017   CHOLHDL 3.4 07/16/2015   INR 1.67 03/01/2016   K 4.6 11/24/2016   MG 2.3 03/04/2016   BUN 16 11/24/2016   CREATININE 1.01 11/24/2016   CREATININE 1.00 02/12/2016    Past medical and surgical history were reviewed and updated in EPIC.  Current Meds  Medication Sig  . acetaminophen (TYLENOL) 500 MG tablet Take 500 mg by mouth every 6 (six) hours as needed (PAIN).   Marland Kitchen aspirin 81 MG tablet Take 1 tablet (81 mg total) by mouth daily.  . cetirizine (ZYRTEC) 10 MG tablet Take 10 mg by mouth daily as needed.   . cholecalciferol (VITAMIN D) 1000 UNITS tablet Take 2,000 Units by mouth daily.   . clopidogrel (PLAVIX) 75 MG tablet Take 75 mg by mouth daily.  . Coenzyme Q10 (COQ10) 100 MG CAPS Take 200 mg by mouth daily.   . fluticasone (FLONASE) 50 MCG/ACT nasal spray  Place 2 sprays into both nostrils daily.  . isosorbide mononitrate (IMDUR) 30 MG 24 hr tablet Take one (1) tablet (30 mg) by mouth daily.  . metoprolol succinate (TOPROL-XL) 25 MG 24 hr tablet Take 0.5 tablets (12.5 mg total) by mouth daily.  . traMADol (ULTRAM) 50 MG tablet Take 1 tablet (50 mg total)  every 12 (twelve) hours as needed by mouth.    Allergies: Altace [ramipril] and Penicillins  Social History   Socioeconomic History  . Marital status: Married    Spouse name: Not on file  . Number of children: Not on file  . Years of education: Not on file  . Highest education level: Not on file  Social Needs  . Financial resource strain: Not on file  . Food insecurity - worry: Not on file  . Food insecurity - inability: Not on file  . Transportation needs - medical: Not on file  . Transportation needs - non-medical: Not on file  Occupational History  . Occupation: retired  Tobacco Use  . Smoking status: Former Smoker    Packs/day: 0.50    Years: 10.00    Pack years: 5.00    Types: Cigars, Cigarettes    Last attempt to quit: 05/30/2002    Years since quitting: 15.0  . Smokeless tobacco: Never Used  Substance and Sexual Activity  . Alcohol use: No  . Drug use: No  . Sexual activity: Not Currently  Other Topics Concern  . Not on file  Social History Narrative  . Not on file    Family History  Problem Relation Age of Onset  . Heart disease Mother   . Stroke Father   . Healthy Brother     Review of Systems: A 12-system review of systems was performed and was negative except as noted in the HPI.  --------------------------------------------------------------------------------------------------  Physical Exam: BP (!) 144/74   Pulse 68   Ht 5\' 11"  (1.803 m)   Wt 264 lb 12.8 oz (120.1 kg)   BMI 36.93 kg/m   General:  Obese man, seated comfortably in the exam room. He is accompanied by his wife. HEENT: No conjunctival pallor or scleral icterus. Moist mucous membranes.  OP clear. Neck: Supple without lymphadenopathy, thyromegaly, JVD, or HJR.  Lungs: Normal work of breathing. Clear to auscultation bilaterally without wheezes or crackles. Heart: Regular rate and rhythm with 2/6 crescendo-decrescendo systolic murmur loudest at the right upper sternal border. No rubs  or gallops. Unable to assess PMI due to body habitus. Abd: Bowel sounds present. Soft, NT/ND. Unable to assess HSM due to body habitus. Ext: No lower extremity edema. Skin: Warm and dry without rash.   Lab Results  Component Value Date   WBC 11.6 (H) 06/10/2016   HGB 14.6 06/10/2016   HCT 43.1 06/10/2016   MCV 90.8 06/10/2016   PLT 240.0 06/10/2016    Lab Results  Component Value Date   NA 139 11/24/2016   K 4.6 11/24/2016   CL 104 11/24/2016   CO2 19 (L) 11/24/2016   BUN 16 11/24/2016   CREATININE 1.01 11/24/2016   GLUCOSE 121 (H) 11/24/2016   ALT 43 02/21/2017    Lab Results  Component Value Date   CHOL 121 02/21/2017   HDL 46 02/21/2017   LDLCALC 49 02/21/2017   TRIG 129 02/21/2017   CHOLHDL 2.6 02/21/2017    --------------------------------------------------------------------------------------------------  ASSESSMENT AND PLAN: Coronary artery disease without angina No chest pain other than some irritation in his chest when outside in the  cold. He has not had any exertional chest discomfort or chest burning reminiscent of what he felt prior to his stents and CABG. We will continue his current medications for secondary prevention. I will obtain a transthoracic echocardiogram, as outlined below. If this does not show significant worsening of AS or decreased EF, we will subsequently get a myocardial perfusion stress test to exclude significant ischemia. If significant structural abnormalities are present, we would need to consider catheterization.  Aortic stenosis Mild aortic stenosis previously noted by echo in 2017. Given his vague complaint of "feeling cloudy" we have agreed to repeat an echocardiogram to ensure that his aortic stenosis has not progressed significantly.  Ischemic cardiomyopathy Mr. Sek appears euvolemic. As above, I will repeat an echocardiogram to ensure stable systolic function. I will start olmesartan 10 mg daily for evidence based heart failure  therapy.  Hyperlipidemia Mr. Revelo is tolerating escalation of rosuvastatin well. Most recent LDL in September was at goal.  Hypertension Blood pressure is mildly elevated today. I will start olmesartan 10 mg daily. We will check a basic metabolic panel today and again in about 2 weeks.  Follow-up: Return to clinic in 3 months  Nelva Bush, MD 06/08/2017 8:43 AM

## 2017-06-08 NOTE — Patient Instructions (Signed)
Medication Instructions:  Start olmesartan 10 mg daily, this will be 2 of a 5 mg tablet daily at the same time  Labwork: Your physician recommends that you have lab work today--BMET.   Your physician recommends that you return for lab work in: about 2 weeks---BMET.   Testing/Procedures: Your physician has requested that you have an echocardiogram. Echocardiography is a painless test that uses sound waves to create images of your heart. It provides your doctor with information about the size and shape of your heart and how well your heart's chambers and valves are working. This procedure takes approximately one hour. There are no restrictions for this procedure.   ABOUT 2 WEEKS-LAB at the same time   Follow-Up: Your physician recommends that you schedule a follow-up appointment in: 3 months with Dr End.    If you need a refill on your cardiac medications before your next appointment, please call your pharmacy.

## 2017-06-13 ENCOUNTER — Telehealth: Payer: Self-pay

## 2017-06-13 NOTE — Telephone Encounter (Signed)
I have done an Olmesartan PA through covermymeds.

## 2017-06-15 NOTE — Telephone Encounter (Signed)
Received fax from [facility] stating approval for OLMESARTAN MEDOXOMIL through 06/13/2018.

## 2017-06-16 ENCOUNTER — Ambulatory Visit (HOSPITAL_COMMUNITY): Payer: Medicare HMO | Attending: Internal Medicine

## 2017-06-16 ENCOUNTER — Other Ambulatory Visit: Payer: Medicare HMO

## 2017-06-16 ENCOUNTER — Other Ambulatory Visit: Payer: Self-pay

## 2017-06-16 DIAGNOSIS — I2510 Atherosclerotic heart disease of native coronary artery without angina pectoris: Secondary | ICD-10-CM | POA: Insufficient documentation

## 2017-06-16 DIAGNOSIS — I252 Old myocardial infarction: Secondary | ICD-10-CM | POA: Diagnosis not present

## 2017-06-16 DIAGNOSIS — E785 Hyperlipidemia, unspecified: Secondary | ICD-10-CM | POA: Diagnosis not present

## 2017-06-16 DIAGNOSIS — I119 Hypertensive heart disease without heart failure: Secondary | ICD-10-CM | POA: Insufficient documentation

## 2017-06-16 DIAGNOSIS — R29898 Other symptoms and signs involving the musculoskeletal system: Secondary | ICD-10-CM | POA: Diagnosis not present

## 2017-06-16 DIAGNOSIS — I082 Rheumatic disorders of both aortic and tricuspid valves: Secondary | ICD-10-CM | POA: Insufficient documentation

## 2017-06-16 DIAGNOSIS — I350 Nonrheumatic aortic (valve) stenosis: Secondary | ICD-10-CM

## 2017-06-16 DIAGNOSIS — I255 Ischemic cardiomyopathy: Secondary | ICD-10-CM

## 2017-06-16 DIAGNOSIS — Z951 Presence of aortocoronary bypass graft: Secondary | ICD-10-CM | POA: Diagnosis not present

## 2017-06-16 DIAGNOSIS — I77810 Thoracic aortic ectasia: Secondary | ICD-10-CM | POA: Diagnosis not present

## 2017-06-16 DIAGNOSIS — I4901 Ventricular fibrillation: Secondary | ICD-10-CM | POA: Insufficient documentation

## 2017-06-16 HISTORY — DX: Nonrheumatic aortic (valve) stenosis: I35.0

## 2017-06-21 ENCOUNTER — Telehealth: Payer: Self-pay

## 2017-06-23 NOTE — Telephone Encounter (Signed)
Result Notes for ECHOCARDIOGRAM COMPLETE   Notes recorded by Frederik Schmidt, RN on 06/21/2017 at 9:11 AM EST Spoke with patient and he informs me that he is feeling "fine right now," and has not had those "cloudy feelings," but I told him that if he begins to "feel cloudy," he should call us immediately to set up a Myocardial Perfusion. ------  Notes recorded by End, Harrell Gave, MD on 06/20/2017 at 3:04 PM EST Please let Hayden Rivera know that I have reviewed his echocardiogram which demonstrates mildly reduced contraction of his left ventricle. There is also mild narrowing of the aortic valve. If he continues to "feel cloudy," I suggest that we obtain a pharmacologic myocardial perfusion stress test to evaluate for ischemia, given prior CAD and CABG.

## 2017-08-16 ENCOUNTER — Other Ambulatory Visit: Payer: Self-pay

## 2017-08-16 DIAGNOSIS — Z951 Presence of aortocoronary bypass graft: Secondary | ICD-10-CM

## 2017-08-16 DIAGNOSIS — I480 Paroxysmal atrial fibrillation: Secondary | ICD-10-CM

## 2017-08-16 DIAGNOSIS — I10 Essential (primary) hypertension: Secondary | ICD-10-CM

## 2017-08-16 MED ORDER — METOPROLOL SUCCINATE ER 25 MG PO TB24: 12.5000 mg | ORAL_TABLET | Freq: Every day | ORAL | 3 refills | Status: DC

## 2017-09-05 ENCOUNTER — Encounter: Payer: Self-pay | Admitting: Internal Medicine

## 2017-09-07 ENCOUNTER — Telehealth (HOSPITAL_COMMUNITY): Payer: Self-pay | Admitting: *Deleted

## 2017-09-07 ENCOUNTER — Encounter: Payer: Self-pay | Admitting: Internal Medicine

## 2017-09-07 ENCOUNTER — Ambulatory Visit: Payer: Medicare HMO | Admitting: Internal Medicine

## 2017-09-07 ENCOUNTER — Encounter: Payer: Self-pay | Admitting: *Deleted

## 2017-09-07 VITALS — BP 128/72 | HR 56 | Ht 71.0 in | Wt 259.0 lb

## 2017-09-07 DIAGNOSIS — I10 Essential (primary) hypertension: Secondary | ICD-10-CM | POA: Diagnosis not present

## 2017-09-07 DIAGNOSIS — I25118 Atherosclerotic heart disease of native coronary artery with other forms of angina pectoris: Secondary | ICD-10-CM | POA: Diagnosis not present

## 2017-09-07 DIAGNOSIS — I350 Nonrheumatic aortic (valve) stenosis: Secondary | ICD-10-CM | POA: Diagnosis not present

## 2017-09-07 DIAGNOSIS — I255 Ischemic cardiomyopathy: Secondary | ICD-10-CM

## 2017-09-07 DIAGNOSIS — E785 Hyperlipidemia, unspecified: Secondary | ICD-10-CM | POA: Diagnosis not present

## 2017-09-07 NOTE — Patient Instructions (Signed)
Medication Instructions:  Your physician recommends that you continue on your current medications as directed. Please refer to the Current Medication list given to you today.   Labwork: None  Testing/Procedures: Your physician has requested that you have a lexiscan myoview. For further information please visit [email or web address]. Please follow instruction sheet, as given.   Follow-Up: Your physician wants you to follow-up in: 6 months with Dr. Saunders Revel.  You will receive a reminder letter in the mail two months in advance. If you don't receive a letter, please call our office to schedule the follow-up appointment.   Any Other Special Instructions Will Be Listed Below (If Applicable).     If you need a refill on your cardiac medications before your next appointment, please call your pharmacy.

## 2017-09-07 NOTE — Telephone Encounter (Signed)
Patient given detailed instructions per Myocardial Perfusion Study Information Sheet for the test on 09/12/17. Patient notified to arrive 15 minutes early and that it is imperative to arrive on time for appointment to keep from having the test rescheduled.  If you need to cancel or reschedule your appointment, please call the office within 24 hours of your appointment. . Patient verbalized understanding. Kirstie Peri

## 2017-09-07 NOTE — Progress Notes (Signed)
Follow-up Outpatient Visit Date: 09/07/2017  Primary Care Provider: Timmothy Euler, MD [place] [address]  Chief Complaint: Follow-up coronary artery disease  HPI:  Hayden Rivera is a 67 y.o. year-old male with history of coronary artery disease status post multiple PCI's and subsequent CABG in 02/2016, postoperative atrial fibrillation, ischemic cardiomyopathy with low normal LVEF, hypertension, and hyperlipidemia, who presents for follow-up of coronary artery disease.  I last saw him in January, at which time he complained of "feeling cloudy."  We agreed to obtain an echo, which showed an LVEF of 45-50% and mild AS.  As he felt fine at the time of the echo, further testing was deferred.  Today, Hayden Rivera reports that he has been feeling relatively well.  He has chronic exertional dyspnea, with shortness of breath beginning after he walks about 100 yards.  He also notices brief "twinges" of chest pain that occur "every once in a while."  The discomfort only last for a few seconds and is unrelated to exertion or other specific activities.  It is different than what he experienced prior to his CABG in 2017.  After our last visit, olmesartan was prescribed due to elevated blood pressure.  Due to this medication being on back order, he did not receive it for several weeks and ultimately never started it.  He remains compliant with the remainder of his medications.  He has not noticed any bleeding, remaining on dual antiplatelet therapy with aspirin and clopidogrel.  --------------------------------------------------------------------------------------------------  Cardiovascular History & Procedures: Cardiovascular Problems:  CAD status post PCI and CABG  Ischemic cardiomyopathy (most recent LVEF 50-55%)  Postoperative atrial fibrillation  Risk Factors:  Known CAD, hypertension, hyperlipidemia, obesity, male gender, and age greater than 69  Cath/PCI:  LHC  (02/17/16): LMCA was 50% distal stenosis. LAD heavily calcified with 40% proximal disease before previous least placed stent. Mid LAD stent with 30% ISR. Ramus with 90% ostial stenosis. LCx with 95% ostial stenosis. LCx stent is patent. RCA with luminal irregularities.  CV Surgery:  CABG (03/03/16, Dr. Gerhardt):LIMA toLAD, left radial to ramus, and SVG to LCx.  EP Procedures and Devices:  None  Non-Invasive Evaluation(s):  TTE (06/16/17): Normal LV size with mild LVH. Mild focal basal hypertrophy of the septum. LVEF 45-50% with mid/apical anteroseptal akinesis.  Grade 2 diastolic dysfunction. Mild AS and trivial AI. Mildly dilated ascending aorta. Mildly elevated PA pressure.  TTE (02/29/16): Normal LV size with mild concentric LVH. LVEF 50-55% with mid anteroseptal, apical septal and apical anterior akinesis. Normal LV diastolic function. Mild aortic stenosis. Mild MR. Normal RV size and function.   Recent CV Pertinent Labs: Lab Results  Component Value Date   CHOL 121 02/21/2017   HDL 46 02/21/2017   LDLCALC 49 02/21/2017   TRIG 129 02/21/2017   CHOLHDL 2.6 02/21/2017   CHOLHDL 3.4 07/16/2015   INR 1.67 03/01/2016   K 4.3 06/08/2017   MG 2.3 03/04/2016   BUN 12 06/08/2017   CREATININE 1.05 06/08/2017   CREATININE 1.00 02/12/2016    Past medical and surgical history were reviewed and updated in EPIC.  Current Meds  Medication Sig  . acetaminophen (TYLENOL) 500 MG tablet Take 500 mg by mouth every 6 (six) hours as needed (PAIN).   Marland Kitchen aspirin 81 MG tablet Take 1 tablet (81 mg total) by mouth daily.  . cetirizine (ZYRTEC) 10 MG tablet Take 10 mg by mouth daily as needed.   . cholecalciferol (VITAMIN D) 1000 UNITS  tablet Take 2,000 Units by mouth daily.   . clopidogrel (PLAVIX) 75 MG tablet Take 75 mg by mouth daily.  . Coenzyme Q10 (COQ10) 100 MG CAPS Take 200 mg by mouth daily.   . fluticasone (FLONASE) 50 MCG/ACT nasal spray Place 2 sprays into both nostrils daily.  .  isosorbide mononitrate (IMDUR) 30 MG 24 hr tablet Take one (1) tablet (30 mg) by mouth daily.  . metoprolol succinate (TOPROL-XL) 25 MG 24 hr tablet Take 0.5 tablets (12.5 mg total) by mouth daily.  . rosuvastatin (CRESTOR) 20 MG tablet Take 1 tablet (20 mg total) by mouth daily.  . traMADol (ULTRAM) 50 MG tablet Take 1 tablet (50 mg total) every 12 (twelve) hours as needed by mouth.    Allergies: Altace [ramipril] and Penicillins  Social History   Socioeconomic History  . Marital status: Married    Spouse name: Not on file  . Number of children: Not on file  . Years of education: Not on file  . Highest education level: Not on file  Occupational History  . Occupation: retired  Scientific laboratory technician  . Financial resource strain: Not on file  . Food insecurity:    Worry: Not on file    Inability: Not on file  . Transportation needs:    Medical: Not on file    Non-medical: Not on file  Tobacco Use  . Smoking status: Former Smoker    Packs/day: 0.50    Years: 10.00    Pack years: 5.00    Types: Cigars, Cigarettes    Last attempt to quit: 05/30/2002    Years since quitting: 15.2  . Smokeless tobacco: Never Used  Substance and Sexual Activity  . Alcohol use: No  . Drug use: No  . Sexual activity: Not Currently  Lifestyle  . Physical activity:    Days per week: Not on file    Minutes per session: Not on file  . Stress: Not on file  Relationships  . Social connections:    Talks on phone: Not on file    Gets together: Not on file    Attends religious service: Not on file    Active member of club or organization: Not on file    Attends meetings of clubs or organizations: Not on file    Relationship status: Not on file  . Intimate partner violence:    Fear of current or ex partner: Not on file    Emotionally abused: Not on file    Physically abused: Not on file    Forced sexual activity: Not on file  Other Topics Concern  . Not on file  Social History Narrative  . Not on file     Family History  Problem Relation Age of Onset  . Heart disease Mother   . Stroke Father   . Healthy Brother     Review of Systems: A 12-system review of systems was performed and was negative except as noted in the HPI.  --------------------------------------------------------------------------------------------------  Physical Exam: BP 128/72   Pulse (!) 56   Ht 5\' 11"  (1.803 m)   Wt 259 lb (117.5 kg)   SpO2 94%   BMI 36.12 kg/m   General: NAD HEENT: No conjunctival pallor or scleral icterus. Moist mucous membranes.  OP clear. Neck: Supple without lymphadenopathy, thyromegaly, JVD, or HJR. Lungs: Normal work of breathing. Clear to auscultation bilaterally without wheezes or crackles. Heart: Bradycardic but regular with 2/6 crescendo-decrescendo systolic murmur loudest at the right upper sternal border.  No rubs or gallops. Non-displaced PMI. Abd: Bowel sounds present. Soft, NT/ND without hepatosplenomegaly Ext: No lower extremity edema. Radial, PT, and DP pulses are 2+ bilaterally. Skin: Warm and dry without rash.  EKG: Sinus bradycardia (heart rate 56 bpm) with left anterior fascicular block and poor R wave progression.  No significant change from prior tracing on 07/21/16.  Lab Results  Component Value Date   WBC 11.6 (H) 06/10/2016   HGB 14.6 06/10/2016   HCT 43.1 06/10/2016   MCV 90.8 06/10/2016   PLT 240.0 06/10/2016    Lab Results  Component Value Date   NA 144 06/08/2017   K 4.3 06/08/2017   CL 105 06/08/2017   CO2 22 06/08/2017   BUN 12 06/08/2017   CREATININE 1.05 06/08/2017   GLUCOSE 126 (H) 06/08/2017   ALT 43 02/21/2017    Lab Results  Component Value Date   CHOL 121 02/21/2017   HDL 46 02/21/2017   LDLCALC 49 02/21/2017   TRIG 129 02/21/2017   CHOLHDL 2.6 02/21/2017    --------------------------------------------------------------------------------------------------  ASSESSMENT AND PLAN: Coronary artery disease with atypical  angina Hayden Rivera reports occasional "twinges" of chest pain, which are different than what he experienced prior to his CABG.  However, echocardiogram after our last visit was notable for mildly reduced LVEF with apical septal and apical akinesis.  We have agreed to obtain a pharmacologic myocardial perfusion stress test to exclude significant ischemia.  We will continue his current medications for secondary prevention and antianginal therapy including metoprolol succinate 12.5 mg daily and isosorbide mononitrate 30 mg daily.  Aortic stenosis No new symptoms to suggest significant AS.  Echocardiogram showed mild stenosis.  We will continue with clinical follow-up.  Ischemic cardiomyopathy LVEF mildly reduced by echo in January.  Hayden Rivera appears euvolemic with NYHA class II-III symptoms.  Blood pressure is normal today.  Given only mild reduction in LVEF, we will defer starting on losartan.  Continue current dose of metoprolol.  Hypertension Blood pressure is well controlled today.  No medication changes.  Hyperlipidemia Goal LDL less than 70; LDL was 49 in 01/2017.  Continue rosuvastatin 20 mg daily.  Morbid obesity BMI greater than 35 with multiple comorbidities including CAD, hypertension, and hyperlipidemia.  I have encouraged Hayden Rivera to lose weight.  If his stress test is low risk, he should increase his activity as well.  Follow-up: Return to clinic in 6 months, sooner if symptoms worsen or stress test demonstrates significant abnormality.  Nelva Bush, MD 09/07/2017 9:01 AM

## 2017-09-12 ENCOUNTER — Ambulatory Visit (HOSPITAL_COMMUNITY): Payer: Medicare HMO | Attending: Cardiology

## 2017-09-12 DIAGNOSIS — I25118 Atherosclerotic heart disease of native coronary artery with other forms of angina pectoris: Secondary | ICD-10-CM | POA: Diagnosis not present

## 2017-09-12 LAB — MYOCARDIAL PERFUSION IMAGING
CHL CUP NUCLEAR SDS: 5
CHL CUP NUCLEAR SRS: 15
CHL CUP NUCLEAR SSS: 20
LHR: 0.36
LV dias vol: 124 mL (ref 62–150)
LV sys vol: 64 mL
NUC STRESS TID: 0.94
Peak HR: 68 {beats}/min
Rest HR: 57 {beats}/min

## 2017-09-12 MED ORDER — TECHNETIUM TC 99M TETROFOSMIN IV KIT
32.3000 | PACK | Freq: Once | INTRAVENOUS | Status: AC | PRN
  Administered 2017-09-12: 32.3 via INTRAVENOUS
  Filled 2017-09-12: qty 33

## 2017-09-12 MED ORDER — TECHNETIUM TC 99M TETROFOSMIN IV KIT
10.1000 | PACK | Freq: Once | INTRAVENOUS | Status: AC | PRN
  Administered 2017-09-12: 10.1 via INTRAVENOUS
  Filled 2017-09-12: qty 11

## 2017-09-12 MED ORDER — REGADENOSON 0.4 MG/5ML IV SOLN
0.4000 mg | Freq: Once | INTRAVENOUS | Status: AC
  Administered 2017-09-12: 0.4 mg via INTRAVENOUS

## 2017-10-04 ENCOUNTER — Other Ambulatory Visit: Payer: Self-pay | Admitting: *Deleted

## 2017-10-04 MED ORDER — CLOPIDOGREL BISULFATE 75 MG PO TABS: 75.0000 mg | ORAL_TABLET | Freq: Every day | ORAL | 3 refills | Status: DC

## 2017-12-15 ENCOUNTER — Other Ambulatory Visit: Payer: Self-pay | Admitting: Internal Medicine

## 2017-12-18 ENCOUNTER — Other Ambulatory Visit: Payer: Self-pay | Admitting: Internal Medicine

## 2017-12-18 MED ORDER — ISOSORBIDE MONONITRATE ER 30 MG PO TB24: ORAL_TABLET | ORAL | 2 refills | Status: DC

## 2017-12-18 NOTE — Telephone Encounter (Signed)
Pt's medication was sent to pt's pharmacy as requested. Confirmation received.  °

## 2017-12-18 NOTE — Telephone Encounter (Signed)
Please review for refill, Thanks !  

## 2018-01-02 ENCOUNTER — Telehealth: Payer: Self-pay | Admitting: *Deleted

## 2018-01-22 DIAGNOSIS — N200 Calculus of kidney: Secondary | ICD-10-CM | POA: Diagnosis not present

## 2018-02-21 NOTE — Progress Notes (Signed)
Follow-up Outpatient Visit Date: 02/23/2018  Primary Care Provider: Timmothy Euler, MD [place] [id number]  Chief Complaint: Follow-up CAD and ischemic cardiomyopathy  HPI:  [name] is a 67 y.o. year-old male with history of coronary artery disease status post multiple PCI's and subsequent CABG in 02/2016, postoperative atrial fibrillation, ischemic cardiomyopathy with low normal LVEF, mild aortic stenosis, hypertension, and hyperlipidemia, who presents for follow-up of coronary artery disease.  I last saw him in April, at which time he reported feeling relatively well with stable exertional dyspnea and rare "twinges" of chest pain lasting only a few seconds at a time and unrelated to exertion.  We did not make any medication changes at that time or pursue additional testing.  Today, [name] reports that he is feeling a little better than at our last visit.  He has not had any further chest pain.  He also notes that his exertional dyspnea is a little better.  He has mild dependent edema from time to time, unchanged from previous visits.  He denies palpitations, lightheadedness, orthopnea, and PND.  He states that his blood pressure was recently normal when checked at the [place].  His only complaint is of chronic joint and back pain, which limits his mobility.  --------------------------------------------------------------------------------------------------  Cardiovascular History & Procedures: Cardiovascular Problems:  CAD status post PCI and CABG  Ischemic cardiomyopathy (most recent LVEF 50-55%)  Postoperative atrial fibrillation  Risk Factors:  Known CAD, hypertension, hyperlipidemia, obesity, male gender, and age greater than 72  Cath/PCI:  LHC (02/17/16): LMCA was 50% distal stenosis. LAD heavily calcified with 40% proximal disease before previous least placed stent. Mid LAD stent with 30% ISR. Ramus with 90% ostial stenosis. LCx with 95% ostial stenosis.  LCx stent is patent. RCA with luminal irregularities.  CV Surgery:  CABG (03/03/16, Dr. Gerhardt):LIMA toLAD, left radial to ramus, and SVG to LCx.  EP Procedures and Devices:  None  Non-Invasive Evaluation(s):  Pharmacologic MPI (09/12/2017): Intermediate risk study with large, fixed defect involving the apex and mid inferior/septal segments.  No ischemia.  LVEF 48% with anteroseptal and apical hypokinesis.  TTE (06/16/17): Normal LV size with mild LVH. Mild focal basal hypertrophy of the septum. LVEF 45-50% with mid/apical anteroseptal akinesis.  Grade 2 diastolic dysfunction. Mild AS and trivial AI. Mildly dilated ascending aorta. Mildly elevated PA pressure.  TTE (02/29/16): Normal LV size with mild concentric LVH. LVEF 50-55% with mid anteroseptal, apical septal and apical anterior akinesis. Normal LV diastolic function. Mild aortic stenosis. Mild MR. Normal RV size and function.  Recent CV Pertinent Labs: Lab Results  Component Value Date   CHOL 121 02/21/2017   HDL 46 02/21/2017   LDLCALC 49 02/21/2017   TRIG 129 02/21/2017   CHOLHDL 2.6 02/21/2017   CHOLHDL 3.4 07/16/2015   INR 1.67 03/01/2016   K 4.3 06/08/2017   MG 2.3 03/04/2016   BUN 12 06/08/2017   CREATININE 1.05 06/08/2017   CREATININE 1.00 02/12/2016    Past medical and surgical history were reviewed and updated in EPIC.  Current Meds  Medication Sig  . acetaminophen (TYLENOL) 500 MG tablet Take 500 mg by mouth every 6 (six) hours as needed (PAIN).   Marland Kitchen aspirin 81 MG tablet Take 1 tablet (81 mg total) by mouth daily.  . cetirizine (ZYRTEC) 10 MG tablet Take 10 mg by mouth daily as needed.   . cholecalciferol (VITAMIN D) 1000 UNITS tablet Take 2,000 Units by mouth daily.   Marland Kitchen  clopidogrel (PLAVIX) 75 MG tablet Take 1 tablet (75 mg total) by mouth daily.  . isosorbide mononitrate (IMDUR) 30 MG 24 hr tablet Take one (1) tablet (30 mg) by mouth daily.  . metoprolol succinate (TOPROL-XL) 25 MG 24 hr tablet Take  0.5 tablets (12.5 mg total) by mouth daily.  . rosuvastatin (CRESTOR) 20 MG tablet TAKE 1 TABLET ONE TIME DAILY (DOSE INCREASE)    Allergies: Altace [ramipril] and Penicillins  Social History   Tobacco Use  . Smoking status: Former Smoker    Packs/day: 0.50    Years: 10.00    Pack years: 5.00    Types: Cigars, Cigarettes    Last attempt to quit: 05/30/2002    Years since quitting: 15.7  . Smokeless tobacco: Never Used  Substance Use Topics  . Alcohol use: No  . Drug use: No    Family History  Problem Relation Age of Onset  . Heart disease Mother   . Stroke Father   . Healthy Brother     Review of Systems: A 12-system review of systems was performed and was negative except as noted in the HPI.  --------------------------------------------------------------------------------------------------  Physical Exam: BP (!) 142/82   Pulse (!) 57   Ht 5\' 11"  (1.803 m)   Wt 255 lb 9.6 oz (115.9 kg)   SpO2 93%   BMI 35.65 kg/m    Repeat BP 128/78  General: NAD.  Accompanied by his wife. HEENT: No conjunctival pallor or scleral icterus. Moist mucous membranes.  OP clear. Neck: Supple without lymphadenopathy, thyromegaly, JVD, or HJR. Lungs: Normal work of breathing. Clear to auscultation bilaterally without wheezes or crackles. Heart: Bradycardic but regular with 2/6 systolic murmur.  No rubs or gallops. Abd: Bowel sounds present. Soft, NT/ND without hepatosplenomegaly Ext: No lower extremity edema. Radial, PT, and DP pulses are 2+ bilaterally. Skin: Warm and dry without rash.  EKG: Sinus bradycardia (heart rate 57 bpm) with isolated PACs, low voltage, left axis deviation, and poor R wave progression.  Lab Results  Component Value Date   WBC 11.6 (H) 06/10/2016   HGB 14.6 06/10/2016   HCT 43.1 06/10/2016   MCV 90.8 06/10/2016   PLT 240.0 06/10/2016    Lab Results  Component Value Date   NA 144 06/08/2017   K 4.3 06/08/2017   CL 105 06/08/2017   CO2 22 06/08/2017     BUN 12 06/08/2017   CREATININE 1.05 06/08/2017   GLUCOSE 126 (H) 06/08/2017   ALT 43 02/21/2017    Lab Results  Component Value Date   CHOL 121 02/21/2017   HDL 46 02/21/2017   LDLCALC 49 02/21/2017   TRIG 129 02/21/2017   CHOLHDL 2.6 02/21/2017    --------------------------------------------------------------------------------------------------  ASSESSMENT AND PLAN: Coronary artery disease with stable angina No further chest pain since our last visit.  Exertional dyspnea has also improved.  Myocardial perfusion stress test after our last visit was intermediate risk with evidence of prior apical and inferior/septal MI but no ischemia.  Given the lack of further chest pain, we have agreed to continue with secondary prevention and antianginal therapy with metoprolol succinate and isosorbide mononitrate.  We will defer catheterization at this time.  I will check a CBC and CMP today.  Chronic HFpEF [name] appears euvolemic and well compensated with NYHA class II symptoms.  Continue current medications.  Sodium restriction encouraged.  Aortic stenosis Mild aortic stenosis noted by prior echo.  No symptoms to suggest worsening stenosis.  Continue clinical follow-up.  Hyperlipidemia  LDL at goal on last check.  We will obtain LFTs and a lipid panel today..  Continue rosuvastatin 20 mg daily.  Hypertension Initial blood pressure mildly elevated, improved on recheck.  Continue current medications.  Follow-up: Given my transition to [facility], the patient wishes to transition his care to [place].  He should follow-up there in 1 year.  Nelva Bush, MD 02/23/2018 10:00 AM

## 2018-02-23 ENCOUNTER — Ambulatory Visit: Payer: Medicare HMO | Admitting: Internal Medicine

## 2018-02-23 ENCOUNTER — Encounter: Payer: Self-pay | Admitting: Internal Medicine

## 2018-02-23 VITALS — BP 142/82 | HR 57 | Ht 71.0 in | Wt 255.6 lb

## 2018-02-23 DIAGNOSIS — I25118 Atherosclerotic heart disease of native coronary artery with other forms of angina pectoris: Secondary | ICD-10-CM

## 2018-02-23 DIAGNOSIS — I350 Nonrheumatic aortic (valve) stenosis: Secondary | ICD-10-CM

## 2018-02-23 DIAGNOSIS — E785 Hyperlipidemia, unspecified: Secondary | ICD-10-CM

## 2018-02-23 DIAGNOSIS — I5032 Chronic diastolic (congestive) heart failure: Secondary | ICD-10-CM | POA: Insufficient documentation

## 2018-02-23 DIAGNOSIS — I10 Essential (primary) hypertension: Secondary | ICD-10-CM | POA: Diagnosis not present

## 2018-02-23 DIAGNOSIS — I2510 Atherosclerotic heart disease of native coronary artery without angina pectoris: Secondary | ICD-10-CM | POA: Diagnosis not present

## 2018-02-23 LAB — COMPREHENSIVE METABOLIC PANEL
ALBUMIN: 4.6 g/dL (ref 3.6–4.8)
ALK PHOS: 84 IU/L (ref 39–117)
ALT: 37 IU/L (ref 0–44)
AST: 50 IU/L — AB (ref 0–40)
Albumin/Globulin Ratio: 2 (ref 1.2–2.2)
BILIRUBIN TOTAL: 0.8 mg/dL (ref 0.0–1.2)
BUN / CREAT RATIO: 14 (ref 10–24)
BUN: 16 mg/dL (ref 8–27)
CHLORIDE: 105 mmol/L (ref 96–106)
CO2: 22 mmol/L (ref 20–29)
Calcium: 9.5 mg/dL (ref 8.6–10.2)
Creatinine, Ser: 1.12 mg/dL (ref 0.76–1.27)
GFR calc Af Amer: 78 mL/min/{1.73_m2} (ref >59)
GFR calc non Af Amer: 68 mL/min/{1.73_m2} (ref >59)
GLUCOSE: 128 mg/dL — AB (ref 65–99)
Globulin, Total: 2.3 g/dL (ref 1.5–4.5)
Potassium: 4.2 mmol/L (ref 3.5–5.2)
Sodium: 145 mmol/L — ABNORMAL HIGH (ref 134–144)
Total Protein: 6.9 g/dL (ref 6.0–8.5)

## 2018-02-23 LAB — LIPID PANEL
CHOLESTEROL TOTAL: 112 mg/dL (ref 100–199)
Chol/HDL Ratio: 3.3 ratio (ref 0.0–5.0)
HDL: 34 mg/dL — ABNORMAL LOW (ref >39)
LDL Calculated: 46 mg/dL (ref 0–99)
TRIGLYCERIDES: 162 mg/dL — AB (ref 0–149)
VLDL CHOLESTEROL CAL: 32 mg/dL (ref 5–40)

## 2018-02-23 LAB — CBC
HEMATOCRIT: 41.7 % (ref 37.5–51.0)
Hemoglobin: 14.4 g/dL (ref 13.0–17.7)
MCH: 33.5 pg — ABNORMAL HIGH (ref 26.6–33.0)
MCHC: 34.5 g/dL (ref 31.5–35.7)
MCV: 97 fL (ref 79–97)
PLATELETS: 143 10*3/uL — AB (ref 150–450)
RBC: 4.3 x10E6/uL (ref 4.14–5.80)
RDW: 13.6 % (ref 12.3–15.4)
WBC: 7.5 10*3/uL (ref 3.4–10.8)

## 2018-02-23 NOTE — Patient Instructions (Addendum)
Medication Instructions:  Your physician recommends that you continue on your current medications as directed. Please refer to the Current Medication list given to you today.  -- If you need a refill on your cardiac medications before your next appointment, please call your pharmacy. --  Labwork:TODAY CMP LIPID CBC  Testing/Procedures: None ordered  Follow-Up: Your physician wants you to follow-up in: 1 year with Provider in [facility] will receive a reminder letter in the mail two months in advance. If you don't receive a letter, please call our office to schedule the follow-up appointment.  Thank you for choosing [facility]!!    Any Other Special Instructions Will Be Listed Below (If Applicable).

## 2018-02-26 ENCOUNTER — Telehealth: Payer: Self-pay

## 2018-02-26 NOTE — Telephone Encounter (Signed)
-----   Message from Nelva Bush, MD sent at 02/25/2018 12:32 PM EDT ----- No significant abnormalities.  AST is minimally elevated, which is non-specific.  Kidney function is normal.  I recommend that Mr. Tippin stay well-hydrated and continue his current medications.  LDL is at goal.  Mild thrombocytopenia is stable.

## 2018-02-26 NOTE — Telephone Encounter (Signed)
Notes recorded by Frederik Schmidt, RN on 02/26/2018 at 9:57 AM EDT Informed patient of results/recommendations. He verbalized understanding.

## 2018-03-06 DIAGNOSIS — N200 Calculus of kidney: Secondary | ICD-10-CM | POA: Diagnosis not present

## 2018-03-06 DIAGNOSIS — R3121 Asymptomatic microscopic hematuria: Secondary | ICD-10-CM | POA: Diagnosis not present

## 2018-05-03 DIAGNOSIS — N200 Calculus of kidney: Secondary | ICD-10-CM | POA: Diagnosis not present

## 2018-05-03 DIAGNOSIS — R310 Gross hematuria: Secondary | ICD-10-CM | POA: Diagnosis not present

## 2018-05-08 NOTE — Telephone Encounter (Signed)
na

## 2018-05-09 ENCOUNTER — Telehealth: Payer: Self-pay | Admitting: Internal Medicine

## 2018-05-09 NOTE — Telephone Encounter (Signed)
New Message        [facility] Pre-operative Risk Assessment    Request for surgical clearance:  1. What type of surgery is being performed? Cystoscopy retrograde polygram ureteroscopy laser lithotripsy and stint placement  2. When is this surgery scheduled? TBD  3. What type of clearance is required (medical clearance vs. Pharmacy clearance to hold med vs. Both)? Both  4. Are there any medications that need to be held prior to surgery and how long? Plavix and aspirin 5 days prior to procedure  5. Practice name and name of physician performing surgery? [facility]/ Dr. [name]  6. What is your office phone number [phone] ext [phone]   7.   What is your office fax number [phone]  8.   Anesthesia type (None, local, MAC, general) ? General   Hayden Rivera 05/09/2018, 3:49 PM  _________________________________________________________________   (provider comments below)

## 2018-05-10 ENCOUNTER — Other Ambulatory Visit: Payer: Self-pay | Admitting: Urology

## 2018-05-11 NOTE — Telephone Encounter (Signed)
I talked with pt by phone and he has had no issues since last appt with Dr. Saunders Revel.  He can do 4 METS of activity.  Will check with Dr. Saunders Revel on holding the Plavix and asa for 5 days.    Dr. Saunders Revel can pt hold ASA and PLAVIX for 5 days for cysto and lithotripsy with stent placement?

## 2018-05-15 NOTE — Telephone Encounter (Signed)
I think it is fine to hold clopidogrel for 5 days before surgery.  I would prefer that ASA 81 mg daily be continued in the perioperative period, if possible, given h/o PCI's and CABG.  If ASA must be stopped from a surgical standpoint due to high risk for severe (life-threatening) bleeding, it held be stopped 7 days prior to surgery.  Both ASA (if held) and clopidogrel should be restarted as soon as it is felt safe to do so by his urologist.  Nelva Bush, MD [place] Pager: [phone]

## 2018-05-15 NOTE — Telephone Encounter (Signed)
   Primary Cardiologist: Nelva Bush, MD  Chart reviewed as part of pre-operative protocol coverage. Patient was contacted 05/15/2018 in reference to pre-operative risk assessment for pending surgery as outlined below.  Hayden Rivera was last seen on 02/23/18 by Dr. Saunders Revel.  Since that day, Hayden Rivera has done very well without chest pain or SOB with hx of CAD and multiple PCIs and CABG.  Also HTN, HLD. See below for Dr. Darnelle Bos recommendations for plavix and ASA.       Therefore, based on ACC/AHA guidelines, the patient would be at acceptable risk for the planned procedure without further cardiovascular testing.   I will route this recommendation to the requesting party via Epic fax function and remove from pre-op pool.  Please call with questions.  Cecilie Kicks, NP 05/15/2018, 1:39 PM

## 2018-05-17 ENCOUNTER — Other Ambulatory Visit: Payer: Self-pay

## 2018-05-17 ENCOUNTER — Encounter (HOSPITAL_BASED_OUTPATIENT_CLINIC_OR_DEPARTMENT_OTHER): Payer: Self-pay

## 2018-05-17 NOTE — Progress Notes (Signed)
Spoke with: Hayden Rivera NPO:  After Midnight, no gum, candy, or mints   Arrival time:  1015AM Labs: Istat 4 (EKG9/27/2019 chart/epic) AM medications: Isosobide, Metoprolol, Rosuvasatin Pre op orders: Yes Ride home:  Bethena Roys (wife) [phone]

## 2018-05-28 ENCOUNTER — Other Ambulatory Visit: Payer: Self-pay | Admitting: Adult Health

## 2018-05-28 ENCOUNTER — Telehealth: Payer: Self-pay | Admitting: Family Medicine

## 2018-05-28 NOTE — H&P (Signed)
Office Visit Report     05/03/2018   --------------------------------------------------------------------------------   Hayden Rivera  MRN: [id number]  PRIMARY CARE:  Chipper Herb, MD  DOB: 05-21-1951, 67 year old Male  REFERRING:  Stevan Born, NP  SSN: [id number]  PROVIDER:  [name], M.D.    TREATING:  Jiles Crocker    LOCATION:  [facility] [phone]   --------------------------------------------------------------------------------   CC: I have a history of kidney stones.  HPI: Hayden Rivera is a 67 year-old male established patient who is here for F/U due to a history of renal calculi.  05/03/18: He has bilateral renal calculi which were found to be stable on exam back in October of this year.   Presents today complaining of intermittent left lower back and flank pain for the past 2 months. He is also encountered intermittent passage of gross hematuria. Denies any increasing urinary frequency/urgency. No burning with urination. Denies nausea/vomiting, fever/chills.   The patient was last seen 03/06/2018. The patient's stone was on his bilateral side. He did not pass a stone since the last office visit.   The patient has had flank pain since they were last seen. The patient denies any progressive voiding symptoms. He has yes seen blood in his urine since the last visit. He is currently having flank pain and back pain. He denies having groin pain, nausea, vomiting, fever, and chills.     ALLERGIES: Penicillins tamsulosin    MEDICATIONS: Crestor  Metoprolol Tartrate 25 mg tablet  Zyrtec  Aspirin Ec 81 mg tablet, delayed release Oral  Clopidogrel 75 mg tablet Oral  Imdur  Losartan Potassium 50 mg tablet Oral  Vitamin D-3 TABS Oral     GU PSH: Cystoscopy - 12/14/2016      PSH Notes: Kidney Surgery, Heart Surgery, Back Surgery   NON-GU PSH: Coronary Artery Bypass Grafting    GU PMH: Renal calculus - 03/06/2018, - 01/22/2018, - 12/14/2016, -  2018, Nephrolithiasis, - 2016 ED due to arterial insufficiency - 01/22/2018, - 12/14/2016, - 2018, Erectile dysfunction due to arterial insufficiency, - 2016 Microscopic hematuria - 12/14/2016 Other microscopic hematuria, Microscopic hematuria - 2016 Urinary Urgency, Urinary urgency - 2016    NON-GU PMH: Encounter for general adult medical examination without abnormal findings, Encounter for preventive health examination - 2016 Personal history of other diseases of the circulatory system, History of congestive heart disease - 2016, History of heart failure, - 2016 Personal history of other diseases of the musculoskeletal system and connective tissue, History of arthritis - 2016    FAMILY HISTORY: cardiac disorder - Runs In Family Hematuria - Runs In Family Hypertension - Runs In Family   SOCIAL HISTORY: Marital Status: Married Preferred Language: English; Race: White Current Smoking Status: Patient does not smoke anymore.   Tobacco Use Assessment Completed: Used Tobacco in last 30 days? Drinks 1 caffeinated drink per day.     Notes: No alcohol use, Occupation, Caffeine use, Married, Number of children, Former smoker   REVIEW OF SYSTEMS:    GU Review Male:   gross hematuria. Patient denies frequent urination, hard to postpone urination, burning/ pain with urination, get up at night to urinate, leakage of urine, stream starts and stops, trouble starting your stream, have to strain to urinate , erection problems, and penile pain.  Gastrointestinal (Upper):   Patient denies nausea, vomiting, and indigestion/ heartburn.  Gastrointestinal (Lower):   Patient denies diarrhea and constipation.  Constitutional:   Patient denies fever, night sweats, weight loss,  and fatigue.  Skin:   Patient denies skin rash/ lesion and itching.  Eyes:   Patient denies blurred vision and double vision.  Ears/ Nose/ Throat:   Patient denies sore throat and sinus problems.  Hematologic/Lymphatic:   Patient denies  swollen glands and easy bruising.  Cardiovascular:   Patient denies leg swelling and chest pains.  Respiratory:   Patient denies cough and shortness of breath.  Endocrine:   Patient denies excessive thirst.  Musculoskeletal:   Patient reports back pain. Patient denies joint pain.  Neurological:   Patient denies headaches and dizziness.  Psychologic:   Patient denies depression and anxiety.   VITAL SIGNS:      05/03/2018 10:14 AM  Weight 250 lb / 113.4 kg  Height 71 in / 180.34 cm  BP 128/80 mmHg  Pulse 57 /min  Temperature 97.5 F / 36.3 C  BMI 34.9 kg/m   MULTI-SYSTEM PHYSICAL EXAMINATION:    Constitutional: Well-nourished. No physical deformities. Normally developed. Good grooming. He does appear mildly uncomfortable but denies pain presently.  Neck: Neck symmetrical, not swollen. Normal tracheal position.  Respiratory: No labored breathing, no use of accessory muscles.   Cardiovascular: Normal temperature, normal extremity pulses, no swelling, no varicosities.  Skin: No paleness, no jaundice, no cyanosis. No lesion, no ulcer, no rash.  Neurologic / Psychiatric: Oriented to time, oriented to place, oriented to person. No depression, no anxiety, no agitation.  Gastrointestinal: No mass, no tenderness, no rigidity, non obese abdomen. No CVAT, flank tenderness.  Musculoskeletal: Normal gait and station of head and neck.     PAST DATA REVIEWED:  Source Of History:  Patient  Records Review:   Previous Patient Records  Urine Test Review:   Urinalysis  X-Ray Review: KUB: Reviewed Films. Discussed With Patient.     05/03/18  Urinalysis  Urine Appearance Turbid   Urine Color Amber   Urine Glucose Neg mg/dL  Urine Bilirubin Neg mg/dL  Urine Ketones Neg mg/dL  Urine Specific Gravity 1.025   Urine Blood 3+ ery/uL  Urine pH 6.0   Urine Protein 2+ mg/dL  Urine Urobilinogen 0.2 mg/dL  Urine Nitrites Neg   Urine Leukocyte Esterase Trace leu/uL  Urine WBC/hpf 0 - 5/hpf   Urine  RBC/hpf >60/hpf   Urine Epithelial Cells NS (Not Seen)   Urine Bacteria Few (10-25/hpf)   Urine Mucous Not Present   Urine Yeast NS (Not Seen)   Urine Trichomonas Not Present   Urine Cystals NS (Not Seen)   Urine Casts NS (Not Seen)   Urine Sperm Not Present    PROCEDURES:         KUB - 31540  A single view of the abdomen is obtained. 4 non-obstructing calculi noted in the left renal shadow remain grossly unchanged from last imaging study. Some shifting around of the stone is seen but roughly remain in the same location. Tracing down the anatomical expected tract of the left ureter no appreciable opacities consistent with ureteral calculi are identified. Stable right-sided stone burden. He has prominent scoliosis of the lumbar spine. Bowel gas pattern within normal limits. Bladder grossly appears free of obstruction.                Urinalysis w/Scope Dipstick Dipstick Cont'd Micro  Color: Amber Bilirubin: Neg mg/dL WBC/hpf: 0 - 5/hpf  Appearance: Turbid Ketones: Neg mg/dL RBC/hpf: >60/hpf  Specific Gravity: 1.025 Blood: 3+ ery/uL Bacteria: Few (10-25/hpf)  pH: 6.0 Protein: 2+ mg/dL Cystals: NS (Not Seen)  Glucose: Neg  mg/dL Urobilinogen: 0.2 mg/dL Casts: NS (Not Seen)    Nitrites: Neg Trichomonas: Not Present    Leukocyte Esterase: Trace leu/uL Mucous: Not Present      Epithelial Cells: NS (Not Seen)      Yeast: NS (Not Seen)      Sperm: Not Present    Notes: microscopic not concentrated    ASSESSMENT:      ICD-10 Details  1 GU:   Renal calculus - N20.0   2   Gross hematuria - R31.0    PLAN:            Medications New Meds: Hydrocodone-Acetaminophen 5 mg-325 mg tablet 1-2 tablet PO Q 6 H PRN   #20  0 Refill(s)            Orders Labs Urine Culture  X-Rays: KUB          Schedule Return Visit/Planned Activity: Next Available Appointment - Schedule Surgery, Follow up MD          Document Letter(s):  Created for Patient: Clinical Summary         Notes:   No  obvious ureteral calculi on imaging today. Can't completely exclude possibility of calculi on the left side ball valving Into the Proximal Ureter Resulting in Increased Pain/Discomfort As Well As Intermittent Gross Hematuria. Dr. Junious Silk mentioned proceeding with ureteroscopy at last office visit versus continued surveillance. As the patient has become more symptomatic, I discussed with him the benefits of going ahead and proceeding with at least a left-sided ureteroscopy. He is agreeable to this after our discussion. The procedure as well as risks and complications were discussed with the patient. These include but are not limited to infection, injury to the urinary tract i.e. ureteral disruption/evulsion, bleeding, stent discomfort, anesthetic complications, among others. I provided a prescription for pain medication for him today. A urine culture was also sent. I'll discuss his case with Dr. Junious Silk and then follow up with the patient. He is on plavix and aspirin so necessary arrangements will need to be made with his cardioligst if we proceed with definitive intervention. F/u instructions for worsening symptoms including persistent gross hematuria, other worsening lower urinary tract symptoms, uncontrollable pain/discomfort, fevers discussed in detail.    * Signed by Jiles Crocker on 05/03/18 at 12:04 PM (EST)*     The information contained in this medical record document is considered private and confidential patient information. This information can only be used for the medical diagnosis and/or medical services that are being provided by the patient's selected caregivers. This information can only be distributed outside of the patient's care if the patient agrees and signs waivers of authorization for this information to be sent to an outside source or route.  I reviewed chart, labs and images. Urine cx was negative. He may need a staged procedure.

## 2018-05-28 NOTE — Telephone Encounter (Signed)
Aware. He must call his surgeon for advice.

## 2018-05-28 NOTE — Anesthesia Preprocedure Evaluation (Addendum)
Anesthesia Evaluation  Patient identified by MRN, date of birth, ID band Patient awake    Reviewed: Allergy & Precautions, H&P , NPO status , Patient's Chart, lab work & pertinent test results  Airway Mallampati: II  TM Distance: >3 FB Neck ROM: Full    Dental no notable dental hx. (+) Teeth Intact, Dental Advisory Given   Pulmonary shortness of breath, former smoker,    Pulmonary exam normal breath sounds clear to auscultation       Cardiovascular hypertension, + angina + CAD  Normal cardiovascular exam+ Valvular Problems/Murmurs  Rhythm:Regular Rate:Normal  Lexiscan 09/12/17  Nuclear stress EF: 48%. Apical and anteroseptal wall hypokinesis  There was no ST segment deviation noted during stress.  Defect 1: There is a large defect of severe severity present in the mid anteroseptal, mid inferoseptal, mid inferior, mid inferolateral, apical anterior, apical septal, apical inferior, apical lateral and apex location.  This is an intermediate risk study. Appears to be large prior infarct pattern as described above. No ischemia identified.   Neuro/Psych Anxiety  Neuromuscular disease    GI/Hepatic Neg liver ROS, GERD  Medicated,  Endo/Other  negative endocrine ROS  Renal/GU negative Renal ROS     Musculoskeletal  (+) Arthritis ,   Abdominal (+) + obese,   Peds  Hematology negative hematology ROS (+)   Anesthesia Other Findings   Reproductive/Obstetrics                            Lab Results  Component Value Date   WBC 7.5 02/23/2018   HGB 14.4 02/23/2018   HCT 41.7 02/23/2018   MCV 97 02/23/2018   PLT 143 (L) 02/23/2018    Anesthesia Physical Anesthesia Plan  ASA: III  Anesthesia Plan: General   Post-op Pain Management:    Induction: Intravenous  PONV Risk Score and Plan: 2 and Treatment may vary due to age or medical condition, Ondansetron and Dexamethasone  Airway Management  Planned: LMA  Additional Equipment:   Intra-op Plan:   Post-operative Plan:   Informed Consent: I have reviewed the patients History and Physical, chart, labs and discussed the procedure including the risks, benefits and alternatives for the proposed anesthesia with the patient or authorized representative who has indicated his/her understanding and acceptance.   Dental advisory given  Plan Discussed with: CRNA  Anesthesia Plan Comments:       Anesthesia Quick Evaluation

## 2018-05-29 ENCOUNTER — Encounter (HOSPITAL_BASED_OUTPATIENT_CLINIC_OR_DEPARTMENT_OTHER): Payer: Self-pay | Admitting: *Deleted

## 2018-05-29 ENCOUNTER — Encounter (HOSPITAL_BASED_OUTPATIENT_CLINIC_OR_DEPARTMENT_OTHER)
Admission: RE | Disposition: A | Discharge status: Home / Self Care | Payer: Self-pay | Source: Ambulatory Visit | Attending: Urology

## 2018-05-29 ENCOUNTER — Ambulatory Visit (HOSPITAL_BASED_OUTPATIENT_CLINIC_OR_DEPARTMENT_OTHER): Payer: Medicare HMO | Admitting: Anesthesiology

## 2018-05-29 ENCOUNTER — Ambulatory Visit (HOSPITAL_COMMUNITY): Payer: Medicare HMO

## 2018-05-29 ENCOUNTER — Ambulatory Visit (HOSPITAL_BASED_OUTPATIENT_CLINIC_OR_DEPARTMENT_OTHER)
Admission: RE | Admit: 2018-05-29 | Discharge: 2018-05-29 | Disposition: A | Discharge status: Home / Self Care | Payer: Medicare HMO | Source: Ambulatory Visit | Attending: Urology | Admitting: Urology

## 2018-05-29 DIAGNOSIS — Z87891 Personal history of nicotine dependence: Secondary | ICD-10-CM | POA: Insufficient documentation

## 2018-05-29 DIAGNOSIS — Z4682 Encounter for fitting and adjustment of non-vascular catheter: Secondary | ICD-10-CM | POA: Diagnosis not present

## 2018-05-29 DIAGNOSIS — Z7902 Long term (current) use of antithrombotics/antiplatelets: Secondary | ICD-10-CM | POA: Insufficient documentation

## 2018-05-29 DIAGNOSIS — Z87442 Personal history of urinary calculi: Secondary | ICD-10-CM | POA: Diagnosis not present

## 2018-05-29 DIAGNOSIS — I2510 Atherosclerotic heart disease of native coronary artery without angina pectoris: Secondary | ICD-10-CM | POA: Diagnosis not present

## 2018-05-29 DIAGNOSIS — Z880 Allergy status to penicillin: Secondary | ICD-10-CM | POA: Diagnosis not present

## 2018-05-29 DIAGNOSIS — Z79899 Other long term (current) drug therapy: Secondary | ICD-10-CM | POA: Insufficient documentation

## 2018-05-29 DIAGNOSIS — I480 Paroxysmal atrial fibrillation: Secondary | ICD-10-CM | POA: Diagnosis not present

## 2018-05-29 DIAGNOSIS — Z888 Allergy status to other drugs, medicaments and biological substances status: Secondary | ICD-10-CM | POA: Insufficient documentation

## 2018-05-29 DIAGNOSIS — N202 Calculus of kidney with calculus of ureter: Secondary | ICD-10-CM | POA: Diagnosis not present

## 2018-05-29 DIAGNOSIS — Z8249 Family history of ischemic heart disease and other diseases of the circulatory system: Secondary | ICD-10-CM | POA: Insufficient documentation

## 2018-05-29 DIAGNOSIS — N200 Calculus of kidney: Secondary | ICD-10-CM | POA: Diagnosis not present

## 2018-05-29 DIAGNOSIS — I110 Hypertensive heart disease with heart failure: Secondary | ICD-10-CM | POA: Diagnosis not present

## 2018-05-29 DIAGNOSIS — I509 Heart failure, unspecified: Secondary | ICD-10-CM | POA: Diagnosis not present

## 2018-05-29 DIAGNOSIS — Z7982 Long term (current) use of aspirin: Secondary | ICD-10-CM | POA: Diagnosis not present

## 2018-05-29 HISTORY — DX: Other chronic pain: G89.29

## 2018-05-29 HISTORY — DX: Nonrheumatic aortic (valve) stenosis: I35.0

## 2018-05-29 HISTORY — PX: CYSTOSCOPY/URETEROSCOPY/HOLMIUM LASER/STENT PLACEMENT: SHX6546

## 2018-05-29 HISTORY — DX: Unspecified atrial fibrillation: I48.91

## 2018-05-29 HISTORY — DX: Hyperlipidemia, unspecified: E78.5

## 2018-05-29 HISTORY — DX: Dorsalgia, unspecified: M54.9

## 2018-05-29 HISTORY — DX: Horseshoe tear of retina without detachment, right eye: H33.311

## 2018-05-29 HISTORY — DX: Edema, unspecified: R60.9

## 2018-05-29 LAB — POCT I-STAT 4, (NA,K, GLUC, HGB,HCT)
Glucose, Bld: 135 mg/dL — ABNORMAL HIGH (ref 70–99)
HEMATOCRIT: 47 % (ref 39.0–52.0)
HEMOGLOBIN: 16 g/dL (ref 13.0–17.0)
Potassium: 4 mmol/L (ref 3.5–5.1)
Sodium: 143 mmol/L (ref 135–145)

## 2018-05-29 SURGERY — CYSTOSCOPY/URETEROSCOPY/HOLMIUM LASER/STENT PLACEMENT
Anesthesia: General | Site: Urethra | Laterality: Bilateral

## 2018-05-29 MED ORDER — FENTANYL CITRATE (PF) 100 MCG/2ML IJ SOLN
25.0000 ug | INTRAMUSCULAR | Status: DC | PRN
  Administered 2018-05-29 (×2): 50 ug via INTRAVENOUS
  Filled 2018-05-29: qty 1

## 2018-05-29 MED ORDER — ONDANSETRON HCL 4 MG/2ML IJ SOLN
INTRAMUSCULAR | Status: DC | PRN
  Administered 2018-05-29: 4 mg via INTRAVENOUS

## 2018-05-29 MED ORDER — DEXAMETHASONE SODIUM PHOSPHATE 10 MG/ML IJ SOLN
INTRAMUSCULAR | Status: AC
  Filled 2018-05-29: qty 1

## 2018-05-29 MED ORDER — OXYCODONE-ACETAMINOPHEN 5-325 MG PO TABS
ORAL_TABLET | ORAL | Status: AC
  Filled 2018-05-29: qty 1

## 2018-05-29 MED ORDER — ACETAMINOPHEN 10 MG/ML IV SOLN
1000.0000 mg | Freq: Once | INTRAVENOUS | Status: DC | PRN
  Filled 2018-05-29: qty 100

## 2018-05-29 MED ORDER — OXYCODONE-ACETAMINOPHEN 5-325 MG PO TABS: 1.0000 | ORAL_TABLET | Freq: Four times a day (QID) | ORAL | 0 refills | Status: DC | PRN

## 2018-05-29 MED ORDER — ONDANSETRON HCL 4 MG/2ML IJ SOLN
4.0000 mg | Freq: Once | INTRAMUSCULAR | Status: DC | PRN
  Filled 2018-05-29: qty 2

## 2018-05-29 MED ORDER — ONDANSETRON HCL 4 MG/2ML IJ SOLN
INTRAMUSCULAR | Status: AC
  Filled 2018-05-29: qty 2

## 2018-05-29 MED ORDER — SODIUM CHLORIDE 0.9 % IR SOLN
Status: DC | PRN
  Administered 2018-05-29: 6000 mL

## 2018-05-29 MED ORDER — FENTANYL CITRATE (PF) 100 MCG/2ML IJ SOLN
INTRAMUSCULAR | Status: DC | PRN
  Administered 2018-05-29 (×2): 25 ug via INTRAVENOUS
  Administered 2018-05-29: 50 ug via INTRAVENOUS
  Administered 2018-05-29 (×2): 25 ug via INTRAVENOUS

## 2018-05-29 MED ORDER — DEXAMETHASONE SODIUM PHOSPHATE 10 MG/ML IJ SOLN
INTRAMUSCULAR | Status: DC | PRN
  Administered 2018-05-29: 6 mg via INTRAVENOUS

## 2018-05-29 MED ORDER — OXYCODONE-ACETAMINOPHEN 5-325 MG PO TABS
1.0000 | ORAL_TABLET | Freq: Once | ORAL | Status: AC
  Administered 2018-05-29: 1 via ORAL
  Filled 2018-05-29: qty 1

## 2018-05-29 MED ORDER — MIDAZOLAM HCL 5 MG/5ML IJ SOLN
INTRAMUSCULAR | Status: DC | PRN
  Administered 2018-05-29: 2 mg via INTRAVENOUS

## 2018-05-29 MED ORDER — LEVOFLOXACIN IN D5W 500 MG/100ML IV SOLN
500.0000 mg | Freq: Once | INTRAVENOUS | Status: AC
  Administered 2018-05-29: 500 mg via INTRAVENOUS
  Filled 2018-05-29: qty 100

## 2018-05-29 MED ORDER — IOHEXOL 300 MG/ML  SOLN
INTRAMUSCULAR | Status: DC | PRN
  Administered 2018-05-29: 10 mL

## 2018-05-29 MED ORDER — FENTANYL CITRATE (PF) 100 MCG/2ML IJ SOLN
INTRAMUSCULAR | Status: AC
  Filled 2018-05-29: qty 2

## 2018-05-29 MED ORDER — DOXYCYCLINE HYCLATE 100 MG PO CAPS: 100.0000 mg | ORAL_CAPSULE | Freq: Two times a day (BID) | ORAL | 0 refills | Status: AC

## 2018-05-29 MED ORDER — LEVOFLOXACIN IN D5W 500 MG/100ML IV SOLN
INTRAVENOUS | Status: AC
  Filled 2018-05-29: qty 100

## 2018-05-29 MED ORDER — LIDOCAINE 2% (20 MG/ML) 5 ML SYRINGE
INTRAMUSCULAR | Status: AC
  Filled 2018-05-29: qty 5

## 2018-05-29 MED ORDER — LACTATED RINGERS IV SOLN
INTRAVENOUS | Status: DC
  Administered 2018-05-29 (×2): via INTRAVENOUS
  Filled 2018-05-29: qty 1000

## 2018-05-29 MED ORDER — LIDOCAINE HCL (CARDIAC) PF 100 MG/5ML IV SOSY
PREFILLED_SYRINGE | INTRAVENOUS | Status: DC | PRN
  Administered 2018-05-29: 100 mg via INTRAVENOUS

## 2018-05-29 MED ORDER — MIDAZOLAM HCL 2 MG/2ML IJ SOLN
INTRAMUSCULAR | Status: AC
  Filled 2018-05-29: qty 2

## 2018-05-29 MED ORDER — PROPOFOL 10 MG/ML IV BOLUS
INTRAVENOUS | Status: DC | PRN
  Administered 2018-05-29: 30 mg via INTRAVENOUS
  Administered 2018-05-29: 140 mg via INTRAVENOUS
  Administered 2018-05-29: 30 mg via INTRAVENOUS

## 2018-05-29 SURGICAL SUPPLY — 28 items
BAG DRAIN URO-CYSTO SKYTR STRL (DRAIN) ×2 IMPLANT
BASKET LASER NITINOL 1.9FR (BASKET) IMPLANT
BASKET STONE 1.7 NGAGE (UROLOGICAL SUPPLIES) ×2 IMPLANT
BASKET ZERO TIP NITINOL 2.4FR (BASKET) ×2 IMPLANT
CATH URET 5FR 28IN CONE TIP (BALLOONS)
CATH URET 5FR 28IN OPEN ENDED (CATHETERS) IMPLANT
CATH URET 5FR 70CM CONE TIP (BALLOONS) IMPLANT
CATH URET DUAL LUMEN 6-10FR 50 (CATHETERS) IMPLANT
CLOTH BEACON ORANGE TIMEOUT ST (SAFETY) ×2 IMPLANT
FIBER LASER FLEXIVA 365 (UROLOGICAL SUPPLIES) IMPLANT
FIBER LASER TRAC TIP (UROLOGICAL SUPPLIES) ×2 IMPLANT
GLOVE BIO SURGEON STRL SZ7.5 (GLOVE) ×4 IMPLANT
GLOVE BIOGEL PI IND STRL 7.5 (GLOVE) ×1 IMPLANT
GLOVE BIOGEL PI INDICATOR 7.5 (GLOVE) ×1
GOWN STRL REUS W/TWL LRG LVL3 (GOWN DISPOSABLE) ×4 IMPLANT
GOWN STRL REUS W/TWL XL LVL3 (GOWN DISPOSABLE) IMPLANT
GUIDEWIRE ANG ZIPWIRE 038X150 (WIRE) ×4 IMPLANT
GUIDEWIRE STR DUAL SENSOR (WIRE) ×2 IMPLANT
INFUSOR MANOMETER BAG 3000ML (MISCELLANEOUS) IMPLANT
IV NS IRRIG 3000ML ARTHROMATIC (IV SOLUTION) ×4 IMPLANT
KIT TURNOVER CYSTO (KITS) ×2 IMPLANT
MANIFOLD NEPTUNE II (INSTRUMENTS) ×2 IMPLANT
NS IRRIG 500ML POUR BTL (IV SOLUTION) ×2 IMPLANT
PACK CYSTO (CUSTOM PROCEDURE TRAY) ×2 IMPLANT
SHEATH URETERAL 12FRX35CM (MISCELLANEOUS) ×2 IMPLANT
STENT URET 6FRX26 CONTOUR (STENTS) ×4 IMPLANT
TUBE CONNECTING 12X1/4 (SUCTIONS) ×2 IMPLANT
TUBING UROLOGY SET (TUBING) ×2 IMPLANT

## 2018-05-29 NOTE — Anesthesia Procedure Notes (Signed)
Procedure Name: LMA Insertion Date/Time: 05/29/2018 12:56 PM Performed by: Jonna Munro, CRNA Pre-anesthesia Checklist: Patient identified, Emergency Drugs available, Suction available, Patient being monitored and Timeout performed Patient Re-evaluated:Patient Re-evaluated prior to induction Oxygen Delivery Method: Circle system utilized Preoxygenation: Pre-oxygenation with 100% oxygen Induction Type: IV induction LMA: LMA inserted LMA Size: 5.0 Number of attempts: 1 Placement Confirmation: positive ETCO2 and breath sounds checked- equal and bilateral Dental Injury: Teeth and Oropharynx as per pre-operative assessment

## 2018-05-29 NOTE — Op Note (Signed)
Preoperative diagnosis: Right renal stone, left renal stones Postoperative diagnosis: Right renal stones, left renal stones  Procedure: Cystoscopy, bilateral retrograde pyelogram, bilateral ureteroscopy with holmium laser lithotripsy, stone basket extraction, ureteral stent placement-22 modifier as the procedure took about twice as long as usual, patient had significant stone burden and difficult anatomy  Surgeon: Junious Silk  Anesthesia: General  Indication for procedure: Patient with symptomatic bilateral stones who elected for ureteroscopy.  Findings: Actually 2 stones in the right lower pole, one stone in the right distal ureter, left upper pole stone, 2 left midpole stones with narrow infundibula making lithotripsy and extraction more difficult  Right retrograde pyelogram-this outlined a single ureter single collecting system unit with a filling defect in the right lower pole consistent with the stones and otherwise normal.  Left retrograde pyelogram-this outlined a single ureter single collecting system unit with stones noted in the upper and midpole calyces but no other filling defects, stricture or dilation.  Description of procedure: After consent was obtained patient brought to the operating room.  After adequate anesthesia was placed lithotomy position prepped and draped in usual sterile fashion.  A timeout was performed to confirm the patient and procedure.  The cystoscope was passed per urethra and the bladder inspected.  The right ureteral orifice was cannulated with a 5 [place] open-ended catheter and retrograde injection of contrast was performed.  A sensor wire was advanced and coiled in the collecting system.  I used the access sheath first the inner cannula and then the entire sheath to gently dilate the ureter and it passed easily.  I used the access sheath to get 2 wires in place. I thought I could feel a stone in the distal ureter even though I did not see 1 on fluoroscopy sliding  along the access sheath so I passed the digital scope along 1 of the wires and inspected the distal ureter and indeed found about a 4 mm stone which was basketed and brought out.  I then repassed the access sheath leaving the Glidewire as the safety wire. The ureteroscope was advanced into the kidney and I was able to access the lower pole stone which had a larger more distal component.  This was mainly dusted but one large fragment was dropped in the renal pelvis with a 0 tip basket and finished off.  Another fragment washed into the upper pole and it was dusted.  And then dusted the remainder of the lower pole stone and no other significant fragments were noted on fluoroscopy.  A few fragments were removed and noted to be about a millimeter.  The collecting system renal pelvis and proximal ureter were inspected and noted to be normal and the access sheath and ureteroscope were brought out together and noted to be normal without stone fragment or injury.  I left the Glidewire in place.  I then turned my attention to the left ureter and retrograde injection of contrast was performed.  A Glidewire was advanced into the collecting system and again I used the access sheath to gently dilate the ureter and it went easily.  I then used the access sheath to get 2 wires in place and went beside the Glidewire and left it as a safety wire.  The ureteroscope was then deployed into the left collecting system and the upper pole stone was dusted.  The more lateral mid pole stone was dusted and then the more anterior or medial mid pole stone was dusted.  This was down a narrow calyx  and tough to get to the edge of that calyx.  I used an engage basket to move that fragment around and finally got it out.  I then sampled the upper pole fragments and noticed no other fragments greater than 1 to 2 mm.  A careful inspection of the collecting system noted there to be no other significant fragments and no significant fragments were  noted on fluoroscopy.  The collecting system and renal pelvis and ureter were inspected with the ureteroscope noted to be normal.  I backed out the access sheath and the ureteroscope together and noted there to be no ureteral fragment or injury.  I then backloaded the Glidewire on the cystoscope and a 626 stent was advanced up the left.  I then backloaded the right wire on the cystoscope and a 6 x 26 cm stent was advanced up the right.  I drained the bladder and removed the scope but accidentally snagged the left stent so I brought it out the meatus and passed the Glidewire back up it and then under fluoroscopic guidance readvanced it and remove the wire.  This put it back in proper position with a coil in the bladder in the collecting system.  Both stents were in good position with strings and he was awakened and taken to recovery room in stable condition.  Complications: None  Blood loss: Minimal  Specimens: Stone fragments  Drains: Bilateral 6 x 26 cm ureteral stents with strings  Disposition: Patient stable to PACU

## 2018-05-29 NOTE — Progress Notes (Signed)
Pt's pharmacy is closed. Pt is unable to get prescriptions.  Dr. Junious Silk paged and informed.  [facility] pharmacy will fill them if Dr. Junious Silk is able to escript now.  Dr. Junious Silk informed, but unable to send them at this point.  Pt  And wife informed.

## 2018-05-29 NOTE — Transfer of Care (Signed)
Immediate Anesthesia Transfer of Care Note  Patient: Hayden Rivera  Procedure(s) Performed: CYSTOSCOPY/RETROGRADE/URETEROSCOPY/HOLMIUM LASER/STENT PLACEMENT/ BASKET STONE EXTRACTION (Bilateral Urethra)  Patient Location: PACU  Anesthesia Type:General  Level of Consciousness: awake, alert  and oriented  Airway & Oxygen Therapy: Patient Spontanous Breathing and Patient connected to face mask oxygen  Post-op Assessment: Report given to RN and Post -op Vital signs reviewed and stable  Post vital signs: Reviewed and stable  Last Vitals:  Vitals Value Taken Time  BP 156/85 05/29/2018  3:05 PM  Temp    Pulse 62 05/29/2018  3:07 PM  Resp 16 05/29/2018  3:07 PM  SpO2 99 % 05/29/2018  3:07 PM  Vitals shown include unvalidated device data.  Last Pain:  Vitals:   05/29/18 1012  TempSrc: Oral         Complications: No apparent anesthesia complications

## 2018-05-29 NOTE — Telephone Encounter (Signed)
Patient is requesting a refill of tessalon perles. TP please advise, thank you.

## 2018-05-29 NOTE — Discharge Instructions (Signed)
[facility] [phone] Post Ureteroscopy With or Without Stent Instructions  Definitions:  Ureter: The duct that transports urine from the kidney to the bladder. Stent:   A plastic hollow tube that is placed into the ureter, from the kidney to the                 bladder to prevent the ureter from swelling shut.  GENERAL INSTRUCTIONS:  Despite the fact that no skin incisions were used, the area around the ureter and bladder is raw and irritated. The stent is a foreign body which will further irritate the bladder wall. This irritation is manifested by increased frequency of urination, both day and night, and by an increase in the urge to urinate. In some, the urge to urinate is present almost always. Sometimes the urge is strong enough that you may not be able to stop yourself from urinating. The only real cure is to remove the stent and then give time for the bladder wall to heal which can't be done until the danger of the ureter swelling shut has passed, which varies.  You may see some blood in your urine while the stent is in place and a few days afterwards. Do not be alarmed, even if the urine was clear for a while. Get off your feet and drink lots of fluids until clearing occurs. If you start to pass clots or don't improve, call us.  DIET: You may return to your normal diet immediately. Because of the raw surface of your bladder, alcohol, spicy foods, acid type foods and drinks with caffeine may cause irritation or frequency and should be used in moderation. To keep your urine flowing freely and to avoid constipation, drink plenty of fluids during the day ( 8-10 glasses ). Tip: Avoid cranberry juice because it is very acidic.  ACTIVITY: Your physical activity doesn't need to be restricted. However, if you are very active, you may see some blood in your urine. We suggest that you reduce your activity under these circumstances until the bleeding has stopped.  BOWELS: It is  important to keep your bowels regular during the postoperative period. Straining with bowel movements can cause bleeding. A bowel movement every other day is reasonable. Use a mild laxative if needed, such as Milk of Magnesia 2-3 tablespoons, or 2 Dulcolax tablets. Call if you continue to have problems. If you have been taking narcotics for pain, before, during or after your surgery, you may be constipated. Take a laxative if necessary.   MEDICATION: You should resume your pre-surgery medications unless told not to. In addition you will often be given an antibiotic to prevent infection. These should be taken as prescribed until the bottles are finished unless you are having an unusual reaction to one of the drugs.  PROBLEMS YOU SHOULD REPORT TO [place]:  Fevers over 100.5 Fahrenheit.  Heavy bleeding, or clots ( See above notes about blood in urine ).  Inability to urinate.  Drug reactions ( hives, rash, nausea, vomiting, diarrhea ).  Severe burning or pain with urination that is not improving.  FOLLOW-UP: You will need a follow-up appointment to monitor your progress. Call for this appointment at the number listed above. Usually the first appointment will be about three to fourteen days after your surgery.     Ureteral Stent Implantation, Care After Refer to this sheet in the next few weeks. These instructions provide you with information about caring for yourself after your procedure. Your health care provider may  also give you more specific instructions. Your treatment has been planned according to current medical practices, but problems sometimes occur. Call your health care provider if you have any problems or questions after your procedure.  Remove the stents by pulling the strings with slow steady pressure on Monday morning, June 04, 2018.  What can I expect after the procedure? After the procedure, it is common to have:  Nausea.  Mild pain when you urinate. You may feel this  pain in your lower back or lower abdomen. Pain should stop within a few minutes after you urinate. This may last for up to 1 week.  A small amount of blood in your urine for several days. Follow these instructions at home:  Medicines  Take over-the-counter and prescription medicines only as told by your health care provider.  If you were prescribed an antibiotic medicine, take it as told by your health care provider. Do not stop taking the antibiotic even if you start to feel better.  Do not drive for 24 hours if you received a sedative.  Do not drive or operate heavy machinery while taking prescription pain medicines. Activity  Return to your normal activities as told by your health care provider. Ask your health care provider what activities are safe for you.  Do not lift anything that is heavier than 10 lb (4.5 kg). Follow this limit for 1 week after your procedure, or for as long as told by your health care provider. General instructions  Watch for any blood in your urine. Call your health care provider if the amount of blood in your urine increases.  If you have a catheter: ? Follow instructions from your health care provider about taking care of your catheter and collection bag. ? Do not take baths, swim, or use a hot tub until your health care provider approves.  Drink enough fluid to keep your urine clear or pale yellow.  Keep all follow-up visits as told by your health care provider. This is important. Contact a health care provider if:  You have pain that gets worse or does not get better with medicine, especially pain when you urinate.  You have difficulty urinating.  You feel nauseous or you vomit repeatedly during a period of more than 2 days after the procedure. Get help right away if:  Your urine is dark red or has blood clots in it.  You are leaking urine (have incontinence).  The end of the stent comes out of your urethra.  You cannot urinate.  You have  sudden, sharp, or severe pain in your abdomen or lower back.  You have a fever. This information is not intended to replace advice given to you by your health care provider. Make sure you discuss any questions you have with your health care provider. Document Released: 01/16/2013 Document Revised: 10/22/2015 Document Reviewed: 11/28/2014 Elsevier Interactive Patient Education  2019 [place] [facility] Instructions  Activity: Get plenty of rest for the remainder of the day. A responsible individual must stay with you for 24 hours following the procedure.  For the next 24 hours, DO NOT: -Drive a car -Paediatric nurse -Drink alcoholic beverages -Take any medication unless instructed by your physician -Make any legal decisions or sign important papers.  Meals: Start with liquid foods such as gelatin or soup. Progress to regular foods as tolerated. Avoid greasy, spicy, heavy foods. If nausea and/or vomiting occur, drink only clear liquids until the nausea and/or vomiting subsides. Call  your physician if vomiting continues.  Special Instructions/Symptoms: Your throat may feel dry or sore from the anesthesia or the breathing tube placed in your throat during surgery. If this causes discomfort, gargle with warm salt water. The discomfort should disappear within 24 hours.  If you had a scopolamine patch placed behind your ear for the management of post- operative nausea and/or vomiting:  1. The medication in the patch is effective for 72 hours, after which it should be removed.  Wrap patch in a tissue and discard in the trash. Wash hands thoroughly with soap and water. 2. You may remove the patch earlier than 72 hours if you experience unpleasant side effects which may include dry mouth, dizziness or visual disturbances. 3. Avoid touching the patch. Wash your hands with soap and water after contact with the patch.

## 2018-05-29 NOTE — Interval H&P Note (Signed)
History and Physical Interval Note:  05/29/2018 12:44 PM  Hayden Rivera  has presented today for surgery, with the diagnosis of BILATERAL RENAL CALCULI  The various methods of treatment have been discussed with the patient and family. After consideration of risks, benefits and other options for treatment, the patient has consented to  Procedure(s): CYSTOSCOPY/RETROGRADE/URETEROSCOPY/HOLMIUM LASER/STENT PLACEMENT/ BASKET STONE EXTRACTION (Bilateral) as a surgical intervention .  The patient's history has been reviewed, patient examined, no change in status, stable for surgery.  He thought he passed a stone on the right a few weeks ago which prompted him to go ahead and get the stones treated.  I went over with the patient and his wife stone burden in the right and left kidneys.  We discussed the nature risk and benefits of surveillance, stage shockwave lithotripsy or staged ureteroscopy.  We discussed he would likely need more than one procedure and may only get pre-stents today and if things are quite tight.  All questions answered.  We discussed he has fairly significant proximal stone burden.  He has had some congestion but no fever.  No dysuria or gross hematuria.  He sounds good today without noticeable congestion.  I did give him the option to reschedule.  I have reviewed the patient's chart and labs.  Questions were answered to the patient's satisfaction. He elects to proceed.   [name]

## 2018-05-29 NOTE — Progress Notes (Signed)
Dr. Valma Cava in to evaluate congested, non productive cough. Afebrile, lungs clear per Dr. Valma Cava.

## 2018-05-29 NOTE — Anesthesia Postprocedure Evaluation (Signed)
Anesthesia Post Note  Patient: Hayden Rivera  Procedure(s) Performed: CYSTOSCOPY/RETROGRADE/URETEROSCOPY/HOLMIUM LASER/STENT PLACEMENT/ BASKET STONE EXTRACTION (Bilateral Urethra)     Patient location during evaluation: PACU Anesthesia Type: General Level of consciousness: awake and alert Pain management: pain level controlled Vital Signs Assessment: post-procedure vital signs reviewed and stable Respiratory status: spontaneous breathing, nonlabored ventilation, respiratory function stable and patient connected to nasal cannula oxygen Cardiovascular status: blood pressure returned to baseline and stable Postop Assessment: no apparent nausea or vomiting Anesthetic complications: no    Last Vitals:  Vitals:   05/29/18 1506 05/29/18 1535  BP: (!) 156/85 (!) 163/90  Pulse: 62 65  Resp: 16 20  Temp: 36.8 C   SpO2: 99% 96%    Last Pain:  Vitals:   05/29/18 1530  TempSrc:   PainSc: [place]

## 2018-05-31 ENCOUNTER — Encounter (HOSPITAL_BASED_OUTPATIENT_CLINIC_OR_DEPARTMENT_OTHER): Payer: Self-pay | Admitting: Urology

## 2018-06-04 NOTE — Telephone Encounter (Signed)
Patient has not been seen in 2 years >> last visit Jan 2018 with Dr Lamonte Sakai Patient is overdue for appt and would ultimately recommend needs follow up   Dr Lamonte Sakai since patient saw you last, may we refill the Tessalon with recommendations that he follow up in the office?  Thank you.

## 2018-06-05 DIAGNOSIS — N200 Calculus of kidney: Secondary | ICD-10-CM | POA: Diagnosis not present

## 2018-06-05 DIAGNOSIS — R3915 Urgency of urination: Secondary | ICD-10-CM | POA: Diagnosis not present

## 2018-06-05 DIAGNOSIS — R310 Gross hematuria: Secondary | ICD-10-CM | POA: Diagnosis not present

## 2018-06-11 ENCOUNTER — Other Ambulatory Visit: Payer: Self-pay | Admitting: Internal Medicine

## 2018-06-11 DIAGNOSIS — I480 Paroxysmal atrial fibrillation: Secondary | ICD-10-CM

## 2018-06-11 DIAGNOSIS — Z951 Presence of aortocoronary bypass graft: Secondary | ICD-10-CM

## 2018-06-11 DIAGNOSIS — I10 Essential (primary) hypertension: Secondary | ICD-10-CM

## 2018-06-11 NOTE — Telephone Encounter (Signed)
Please review for refill. Thanks!  

## 2018-06-22 ENCOUNTER — Telehealth: Payer: Self-pay | Admitting: Internal Medicine

## 2018-06-22 NOTE — Telephone Encounter (Signed)
Dentist asking him to get clearance for having teeth worked  -   Patient wants to know if he can come off blood thinner for that and colonoscopy around same time

## 2018-06-25 ENCOUNTER — Ambulatory Visit (INDEPENDENT_AMBULATORY_CARE_PROVIDER_SITE_OTHER): Payer: Medicare HMO | Admitting: Family Medicine

## 2018-06-25 ENCOUNTER — Encounter: Payer: Self-pay | Admitting: Family Medicine

## 2018-06-25 VITALS — BP 139/83 | HR 60 | Temp 98.5°F | Ht 71.0 in | Wt 253.0 lb

## 2018-06-25 DIAGNOSIS — I5032 Chronic diastolic (congestive) heart failure: Secondary | ICD-10-CM | POA: Diagnosis not present

## 2018-06-25 DIAGNOSIS — Z1211 Encounter for screening for malignant neoplasm of colon: Secondary | ICD-10-CM | POA: Diagnosis not present

## 2018-06-25 DIAGNOSIS — Z23 Encounter for immunization: Secondary | ICD-10-CM

## 2018-06-25 DIAGNOSIS — Z1212 Encounter for screening for malignant neoplasm of rectum: Secondary | ICD-10-CM

## 2018-06-25 DIAGNOSIS — I10 Essential (primary) hypertension: Secondary | ICD-10-CM

## 2018-06-25 DIAGNOSIS — I255 Ischemic cardiomyopathy: Secondary | ICD-10-CM | POA: Diagnosis not present

## 2018-06-25 DIAGNOSIS — I480 Paroxysmal atrial fibrillation: Secondary | ICD-10-CM

## 2018-06-25 DIAGNOSIS — R809 Proteinuria, unspecified: Secondary | ICD-10-CM

## 2018-06-25 NOTE — Telephone Encounter (Signed)
I spoke with the patient. I have advised him that he will need to call his dental office and ask them to touch base with [place] directly about what type of procedure he will be having and what they are requesting in regards to his ASA & plavix for his dental procedure.   The patient states that he saw his PCP today and they are working on getting him set up for a colonoscopy. I advised that the GI doctors are usually good at getting in touch with [place] about the procedure and what they are requesting as far as holding medications.  The patient again was advised that each office will need to touch base with [place] directly. The patient voices understanding and is agreeable.

## 2018-06-25 NOTE — Progress Notes (Signed)
Subjective:    Patient ID: Hayden Rivera, male    DOB: [birth year], 68 y.o.   MRN: [id number]  Chief Complaint:  Medical Management of Chronic Issues (referral for colonoscopy; needs to come off Plavix and Aspirin to have tooth pulled)   HPI: Hayden Rivera is a 68 y.o. male presenting on 06/25/2018 for Medical Management of Chronic Issues (referral for colonoscopy; needs to come off Plavix and Aspirin to have tooth pulled)   1. Essential hypertension  Complaint with meds - Yes Checking BP at home - No Exercising Regularly - No Watching Salt intake - Yes Pertinent ROS:  Headache - No Chest pain - No Dyspnea - No Palpitations - No LE edema - No They report good compliance with medications and can restate their regimen by memory. No medication side effects.  BP Readings from Last 3 Encounters:  06/25/18 139/83  05/29/18 (!) 141/75  02/23/18 (!) 142/82     2. Morbid obesity ([place])  Does not exercise on a regular basis. Does not watch diet.    3. Chronic heart failure with preserved ejection fraction ([place])  Denies chest pain, palpitations, shortness of breath, or lower extremity edema. No orthopnea. Followed by cardiology. Last saw in 01/2018. States cardiology has recently moved and he needs a new cardiologist. States he needs to come off of his plavix and ASA for a colonoscopy and dental surgery. Needs referral for a new cardiologist.    4. Ischemic cardiomyopathy  Denies chest pain, palpitations, shortness of breath, or lower extremity edema. No orthopnea. Followed by cardiology. Last saw in 01/2018. States cardiology has recently moved and he needs a new cardiologist. States he needs to come off of his plavix and ASA for a colonoscopy and dental surgery. Needs referral for a new cardiologist.    5. PAF (paroxysmal atrial fibrillation) ([place])  Denies chest pain, palpitations, shortness of breath, or lower extremity edema. No orthopnea. Followed by cardiology. Last saw in 01/2018.  States cardiology has recently moved and he needs a new cardiologist. States he needs to come off of his plavix and ASA for a colonoscopy and dental surgery. Needs referral for a new cardiologist.   6. Encounter for colorectal cancer screening  States he has never had a colonoscopy. He denies a family history of colorectal cancer. He denies rectal pain or bleeding. Denies abdominal pain or changes in bowel habits.      Relevant past medical, surgical, family, and social history reviewed and updated as indicated.  Allergies and medications reviewed and updated.   Past Medical History:  Diagnosis Date  . Anginal pain ([place])    occ  . Aortic stenosis 06/16/2017   Mild, noted on ECHO  . Arthritis   . Atrial fibrillation ([place]) 02/2016   post op  . CAD (coronary artery disease)    a. Anterior STEMI 2005 c/b vfib arrest, PCI to LAD at [facility]. b. Stent thrombosis 2006 with DES within prior LAD stent at [facility]. C. 09/2012: s/p balloon angioplasty to mLAD for severe stenosis in previously stented segment & DES to LCx; initial enz neg but ruled in for NSTEMI after post-cath vagal sx, felt d/t distal emboliz of thrombus during case.  . Chronic back pain   . Dependent edema    Mild, occ  . Elevated hemidiaphragm    a. Noted 09/2012 - instructed to f/u PCP.  Marland Kitchen GERD (gastroesophageal reflux disease)   . Heart murmur   . History of kidney stones   .  Hyperlipidemia   . Hypertension   . Ischemic cardiomyopathy    a. Unclear prior EF but pt was told heart was weakened in past. b. EF normal 09/2012.  Marland Kitchen Myocardial infarction ([place])   . Retinal tear, right   . Shortness of breath dyspnea    not currently  . Ventricular fibrillation ([place])    a. VF arrest 2005 in setting of STEMI.  . Wears glasses     Past Surgical History:  Procedure Laterality Date  . BACK SURGERY    . CARDIAC CATHETERIZATION     stents x2  . CARDIAC CATHETERIZATION N/A 10/02/2014   Procedure: Left Heart Cath And Coronary  Angiography;  Surgeon: Larey Dresser, MD;  Location: [facility] INVASIVE CV LAB CUPID;  Service: Cardiovascular;  Laterality: N/A;  . CARDIAC CATHETERIZATION N/A 02/17/2016   Procedure: Left Heart Cath and Coronary Angiography;  Surgeon: Larey Dresser, MD;  Location: [place] CV LAB;  Service: Cardiovascular;  Laterality: N/A;  . CORONARY ARTERY BYPASS GRAFT N/A 03/01/2016   Procedure: CORONARY ARTERY BYPASS GRAFTING (CABG)x3 with endoscopic harvesting of right saphenous vein -LIMA to LAD -SVG to LEFT CIRCUMFLEX -RADIAL ARTERY to RAMUS INTERMEDIA;  Surgeon: Grace Isaac, MD;  Location: [place];  Service: Open Heart Surgery;  Laterality: N/A;  . CORONARY STENT PLACEMENT    . CYSTOSCOPY/URETEROSCOPY/HOLMIUM LASER/STENT PLACEMENT Bilateral 05/29/2018   Procedure: CYSTOSCOPY/RETROGRADE/URETEROSCOPY/HOLMIUM LASER/STENT PLACEMENT/ BASKET STONE EXTRACTION;  Surgeon: [name], MD;  Location: [facility];  Service: Urology;  Laterality: Bilateral;  . EYE EXAMINATION UNDER ANESTHESIA W/ RETINAL CRYOTHERAPY AND RETINAL LASER    . HAND SURGERY Left    hamick bone rem  . KNEE ARTHROSCOPY Right 10/24/2014   Procedure: ARTHROSCOPY RIGHT KNEE, Partial medial menisectomy and chondroplasty;  Surgeon: Melrose Nakayama, MD;  Location: [place];  Service: Orthopedics;  Laterality: Right;  . LEFT HEART CATH N/A 10/11/2012   Procedure: LEFT HEART CATH;  Surgeon: Sherren Mocha, MD;  Location: [facility] CATH LAB;  Service: Cardiovascular;  Laterality: N/A;  . LITHOTRIPSY    . RADIAL ARTERY HARVEST Left 03/01/2016   Procedure: LEFT RADIAL ARTERY HARVEST;  Surgeon: Grace Isaac, MD;  Location: [place];  Service: Open Heart Surgery;  Laterality: Left;  . SPLIT NIGHT STUDY  09/21/2015  . TEE WITHOUT CARDIOVERSION N/A 03/01/2016   Procedure: TRANSESOPHAGEAL ECHOCARDIOGRAM (TEE);  Surgeon: Grace Isaac, MD;  Location: [place];  Service: Open Heart Surgery;  Laterality: N/A;  . TONSILLECTOMY      Social  History   Socioeconomic History  . Marital status: Married    Spouse name: Not on file  . Number of children: Not on file  . Years of education: Not on file  . Highest education level: Not on file  Occupational History  . Occupation: retired  Scientific laboratory technician  . Financial resource strain: Not on file  . Food insecurity:    Worry: Not on file    Inability: Not on file  . Transportation needs:    Medical: Not on file    Non-medical: Not on file  Tobacco Use  . Smoking status: Former Smoker    Packs/day: 0.50    Years: 10.00    Pack years: 5.00    Types: Cigars, Cigarettes    Last attempt to quit: 05/30/2002    Years since quitting: 16.0  . Smokeless tobacco: Never Used  Substance and Sexual Activity  . Alcohol use: Not Currently    Comment: occ  . Drug use:  No  . Sexual activity: Not Currently  Lifestyle  . Physical activity:    Days per week: Not on file    Minutes per session: Not on file  . Stress: Not on file  Relationships  . Social connections:    Talks on phone: Not on file    Gets together: Not on file    Attends religious service: Not on file    Active member of club or organization: Not on file    Attends meetings of clubs or organizations: Not on file    Relationship status: Not on file  . Intimate partner violence:    Fear of current or ex partner: Not on file    Emotionally abused: Not on file    Physically abused: Not on file    Forced sexual activity: Not on file  Other Topics Concern  . Not on file  Social History Narrative  . Not on file    Outpatient Encounter Medications as of 06/25/2018  Medication Sig  . acetaminophen (TYLENOL) 500 MG tablet Take 500 mg by mouth every 6 (six) hours as needed (PAIN).   Marland Kitchen aspirin 81 MG tablet Take 1 tablet (81 mg total) by mouth daily.  . cetirizine (ZYRTEC) 10 MG tablet Take 10 mg by mouth daily as needed.   . cholecalciferol (VITAMIN D) 1000 UNITS tablet Take 2,000 Units by mouth daily.   . clopidogrel  (PLAVIX) 75 MG tablet Take 1 tablet (75 mg total) by mouth daily.  . isosorbide mononitrate (IMDUR) 30 MG 24 hr tablet Take one (1) tablet (30 mg) by mouth daily.  . metoprolol succinate (TOPROL-XL) 25 MG 24 hr tablet TAKE 1/2 TABLET EVERY DAY  . rosuvastatin (CRESTOR) 20 MG tablet TAKE 1 TABLET ONE TIME DAILY (DOSE INCREASE)  . [DISCONTINUED] oxyCODONE-acetaminophen (PERCOCET) 5-325 MG tablet Take 1 tablet by mouth every 6 (six) hours as needed for severe pain.   No facility-administered encounter medications on file as of 06/25/2018.     Allergies  Allergen Reactions  . Altace [Ramipril] Shortness Of Breath and Other (See Comments)    cough  . Penicillins Swelling    Has patient had a PCN reaction causing immediate rash, facial/tongue/throat swelling, SOB or lightheadedness with hypotension:unsure Has patient had a PCN reaction causing severe rash involving mucus membranes or skin necrosis:unsure Has patient had a PCN reaction that required hospitalization:No Has patient had a PCN reaction occurring within the last 10 years:NO If all of the above answers are "NO", then may proceed with Cephalosporin use.  Childhood reaction      Review of Systems  Constitutional: Negative for chills, fatigue and fever.  HENT: Negative for congestion.   Eyes: Negative for photophobia and visual disturbance.  Respiratory: Negative for cough, chest tightness, shortness of breath and wheezing.   Cardiovascular: Negative for chest pain, palpitations and leg swelling.  Gastrointestinal: Negative for abdominal distention, abdominal pain, anal bleeding, blood in stool, constipation, diarrhea, nausea, rectal pain and vomiting.  Endocrine: Negative for polydipsia, polyphagia and polyuria.  Genitourinary: Negative for decreased urine volume and difficulty urinating.  Musculoskeletal: Negative for neck pain and neck stiffness.  Neurological: Negative for dizziness, tremors, seizures, syncope, facial  asymmetry, speech difficulty, weakness, light-headedness, numbness and headaches.  Psychiatric/Behavioral: Negative for confusion.  All other systems reviewed and are negative.       Objective:    BP 139/83   Pulse 60   Temp 98.5 F (36.9 C) (Oral)   Ht 5' 11"  (1.803 m)  Wt 253 lb (114.8 kg)   BMI 35.29 kg/m    Wt Readings from Last 3 Encounters:  06/25/18 253 lb (114.8 kg)  05/29/18 247 lb 11.2 oz (112.4 kg)  02/23/18 255 lb 9.6 oz (115.9 kg)    Physical Exam Vitals signs and nursing note reviewed.  Constitutional:      General: He is not in acute distress.    Appearance: He is well-developed and well-groomed. He is obese. He is not ill-appearing or toxic-appearing.  HENT:     Head: Normocephalic and atraumatic.     Right Ear: Tympanic membrane, ear canal and external ear normal.     Left Ear: Tympanic membrane, ear canal and external ear normal.     Nose: Nose normal.     Mouth/Throat:     Mouth: Mucous membranes are moist.     Pharynx: Oropharynx is clear.  Eyes:     Extraocular Movements: Extraocular movements intact.     Conjunctiva/sclera: Conjunctivae normal.     Pupils: Pupils are equal, round, and reactive to light.  Neck:     Musculoskeletal: Normal range of motion and neck supple. No neck rigidity or muscular tenderness.     Vascular: No carotid bruit.  Cardiovascular:     Rate and Rhythm: Normal rate and regular rhythm.     Pulses: Normal pulses.     Heart sounds: Murmur present. Systolic murmur present with a grade of 2/6. No friction rub. No gallop.   Pulmonary:     Effort: Pulmonary effort is normal. No respiratory distress.     Breath sounds: Normal breath sounds. No rhonchi or rales.  Lymphadenopathy:     Cervical: No cervical adenopathy.  Skin:    General: Skin is warm and dry.     Capillary Refill: Capillary refill takes less than 2 seconds.  Neurological:     General: No focal deficit present.     Mental Status: He is alert and oriented  to person, place, and time.     Cranial Nerves: No cranial nerve deficit.     Sensory: No sensory deficit.     Motor: No weakness.     Coordination: Coordination normal.     Gait: Gait normal.     Deep Tendon Reflexes: Reflexes normal.  Psychiatric:        Mood and Affect: Mood normal.        Behavior: Behavior normal. Behavior is cooperative.        Thought Content: Thought content normal.        Judgment: Judgment normal.     Results for orders placed or performed during the hospital encounter of 05/29/18  I-STAT 4, (NA,K, GLUC, HGB,HCT)  Result Value Ref Range   Sodium 143 135 - 145 mmol/L   Potassium 4.0 3.5 - 5.1 mmol/L   Glucose, Bld 135 (H) 70 - 99 mg/dL   HCT 47.0 39.0 - 52.0 %   Hemoglobin 16.0 13.0 - 17.0 g/dL       Pertinent labs & imaging results that were available during my care of the patient were reviewed by me and considered in my medical decision making.  Assessment & Plan:  Ramzi was seen today for medical management of chronic issues.  Diagnoses and all orders for this visit:  Essential hypertension DASH diet. Diet and exercise encouraged. Labs pending. Continue current medication regimen.  -     CMP14+EGFR -     CBC with Differential/Platelet -     Lipid panel -  TSH -     Microalbumin / creatinine urine ratio  Morbid obesity (HCC) Diet and exercise encouraged. Labs pending.  -     CMP14+EGFR -     Lipid panel -     TSH  Chronic heart failure with preserved ejection fraction ([place]) Procedure needed and wants to come off of Plavix and ASA. Has not seen cardiology. Needs new cardiologist because his moved. Referral made.  -     Ambulatory referral to Cardiology  Ischemic cardiomyopathy Procedure needed and wants to come off of Plavix and ASA. Has not seen cardiology. Needs new cardiologist because his moved. Referral made.  -     Ambulatory referral to Cardiology  PAF (paroxysmal atrial fibrillation) ([place]) Procedure needed and wants to come  off of Plavix and ASA. Has not seen cardiology. Needs new cardiologist because his moved. Referral made.  -     Ambulatory referral to Cardiology  Encounter for colorectal cancer screening Never had a colonoscopy. Referral made today. -     Ambulatory referral to Gastroenterology     Continue all other maintenance medications.  Follow up plan: Return in about 3 months (around 09/24/2018), or if symptoms worsen or fail to improve.  Educational handout given for Hypertension  The above assessment and management plan was discussed with the patient. The patient verbalized understanding of and has agreed to the management plan. Patient is aware to call the clinic if symptoms persist or worsen. Patient is aware when to return to the clinic for a follow-up visit. Patient educated on when it is appropriate to go to the emergency department.   Monia Pouch, FNP-C [facility] [phone]

## 2018-06-25 NOTE — Patient Instructions (Signed)

## 2018-06-26 LAB — CBC WITH DIFFERENTIAL/PLATELET
BASOS: 1 %
Basophils Absolute: 0.1 10*3/uL (ref 0.0–0.2)
EOS (ABSOLUTE): 0.4 10*3/uL (ref 0.0–0.4)
EOS: 4 %
Hematocrit: 41.6 % (ref 37.5–51.0)
Hemoglobin: 14.5 g/dL (ref 13.0–17.7)
IMMATURE GRANULOCYTES: 0 %
Immature Grans (Abs): 0 10*3/uL (ref 0.0–0.1)
Lymphocytes Absolute: 2.9 10*3/uL (ref 0.7–3.1)
Lymphs: 33 %
MCH: 33.1 pg — ABNORMAL HIGH (ref 26.6–33.0)
MCHC: 34.9 g/dL (ref 31.5–35.7)
MCV: 95 fL (ref 79–97)
Monocytes Absolute: 0.8 10*3/uL (ref 0.1–0.9)
Monocytes: 9 %
NEUTROS PCT: 53 %
Neutrophils Absolute: 4.5 10*3/uL (ref 1.4–7.0)
Platelets: 146 10*3/uL — ABNORMAL LOW (ref 150–450)
RBC: 4.38 x10E6/uL (ref 4.14–5.80)
RDW: 12.8 % (ref 11.6–15.4)
WBC: 8.7 10*3/uL (ref 3.4–10.8)

## 2018-06-26 LAB — LIPID PANEL
Chol/HDL Ratio: 2.9 ratio (ref 0.0–5.0)
Cholesterol, Total: 131 mg/dL (ref 100–199)
HDL: 45 mg/dL (ref >39)
LDL Calculated: 50 mg/dL (ref 0–99)
Triglycerides: 182 mg/dL — ABNORMAL HIGH (ref 0–149)
VLDL Cholesterol Cal: 36 mg/dL (ref 5–40)

## 2018-06-26 LAB — MICROALBUMIN / CREATININE URINE RATIO
Creatinine, Urine: 172.7 mg/dL
Microalb/Creat Ratio: 102 mg/g creat — ABNORMAL HIGH (ref 0–29)
Microalbumin, Urine: 176 ug/mL

## 2018-06-26 LAB — CMP14+EGFR
ALT: 40 IU/L (ref 0–44)
AST: 45 IU/L — ABNORMAL HIGH (ref 0–40)
Albumin/Globulin Ratio: 1.7 (ref 1.2–2.2)
Albumin: 4.5 g/dL (ref 3.8–4.8)
Alkaline Phosphatase: 92 IU/L (ref 39–117)
BUN/Creatinine Ratio: 11 (ref 10–24)
BUN: 10 mg/dL (ref 8–27)
Bilirubin Total: 0.7 mg/dL (ref 0.0–1.2)
CALCIUM: 9.3 mg/dL (ref 8.6–10.2)
CO2: 20 mmol/L (ref 20–29)
Chloride: 106 mmol/L (ref 96–106)
Creatinine, Ser: 0.94 mg/dL (ref 0.76–1.27)
GFR calc Af Amer: 96 mL/min/{1.73_m2} (ref >59)
GFR calc non Af Amer: 83 mL/min/{1.73_m2} (ref >59)
Globulin, Total: 2.6 g/dL (ref 1.5–4.5)
Glucose: 123 mg/dL — ABNORMAL HIGH (ref 65–99)
Potassium: 4.1 mmol/L (ref 3.5–5.2)
Sodium: 144 mmol/L (ref 134–144)
Total Protein: 7.1 g/dL (ref 6.0–8.5)

## 2018-06-26 LAB — TSH: TSH: 2.68 u[IU]/mL (ref 0.450–4.500)

## 2018-07-02 ENCOUNTER — Telehealth: Payer: Self-pay | Admitting: Internal Medicine

## 2018-07-02 NOTE — Telephone Encounter (Signed)
Received staff message stating that it is fine with Dr. Saunders Revel if the patient wishes to get a doctor at [facility].

## 2018-07-03 ENCOUNTER — Encounter: Payer: Self-pay | Admitting: Nurse Practitioner

## 2018-07-05 DIAGNOSIS — N200 Calculus of kidney: Secondary | ICD-10-CM | POA: Diagnosis not present

## 2018-07-06 ENCOUNTER — Encounter: Payer: Self-pay | Admitting: Cardiology

## 2018-07-06 ENCOUNTER — Ambulatory Visit: Payer: Medicare HMO | Admitting: Cardiology

## 2018-07-06 VITALS — BP 119/71 | HR 60 | Ht 71.0 in | Wt 252.2 lb

## 2018-07-06 DIAGNOSIS — I10 Essential (primary) hypertension: Secondary | ICD-10-CM

## 2018-07-06 DIAGNOSIS — I255 Ischemic cardiomyopathy: Secondary | ICD-10-CM | POA: Diagnosis not present

## 2018-07-06 DIAGNOSIS — I350 Nonrheumatic aortic (valve) stenosis: Secondary | ICD-10-CM

## 2018-07-06 DIAGNOSIS — I2510 Atherosclerotic heart disease of native coronary artery without angina pectoris: Secondary | ICD-10-CM

## 2018-07-06 DIAGNOSIS — E782 Mixed hyperlipidemia: Secondary | ICD-10-CM | POA: Diagnosis not present

## 2018-07-06 NOTE — Patient Instructions (Signed)
Medication Instructions:  Continue all current medications.  Labwork:   Testing/Procedures:   Follow-Up: Your physician wants you to follow up in: 6 months.  You will receive a reminder letter in the mail one-two months in advance.  If you don't receive a letter, please call our office to schedule the follow up appointment   Any Other Special Instructions Will Be Listed Below (If Applicable).  If you need a refill on your cardiac medications before your next appointment, please call your pharmacy.  

## 2018-07-06 NOTE — Progress Notes (Signed)
Clinical Summary Hayden Rivera is a 68 y.o.male last seen by Dr End, this is our first visit together.  1. CAD - multiple PCIs, CABG in 02/2016 08/2017 nuclear stress no ischemia, large prior infarct  - no recent chest pain. No SOB or DOE - compliant with meds   2. ICM - Jan 2019 echo LVEF 45-50%, akinesis mid-apicalanteroseptal wall. Grade II diastolic dysfunction - no recent edema   3. Aortic stenosis Jan 2019 mild AS: mean grade 12, AVA VTI 1.81  4. HTN - compliant with meds  5. Hyperlipidemia Jan 2020 TC 131 TG 182 HDL 45 LDL 50   6. Upcoming procedures - dental surgery with removal coming, also colonscopy and possible shoulder surgery.   7. Postop afib - history of postop afib, no PAF as some of his chart reports. Problem list updated.    Past Medical History:  Diagnosis Date  . Anginal pain ([place])    occ  . Aortic stenosis 06/16/2017   Mild, noted on ECHO  . Arthritis   . Atrial fibrillation ([place]) 02/2016   post op  . CAD (coronary artery disease)    a. Anterior STEMI 2005 c/b vfib arrest, PCI to LAD at [facility]. b. Stent thrombosis 2006 with DES within prior LAD stent at [facility] C. 09/2012: s/p balloon angioplasty to mLAD for severe stenosis in previously stented segment & DES to LCx; initial enz neg but ruled in for NSTEMI after post-cath vagal sx, felt d/t distal emboliz of thrombus during case.  . Chronic back pain   . Dependent edema    Mild, occ  . Elevated hemidiaphragm    a. Noted 09/2012 - instructed to f/u PCP.  Marland Kitchen GERD (gastroesophageal reflux disease)   . Heart murmur   . History of kidney stones   . Hyperlipidemia   . Hypertension   . Ischemic cardiomyopathy    a. Unclear prior EF but pt was told heart was weakened in past. b. EF normal 09/2012.  Marland Kitchen Myocardial infarction ([place])   . Retinal tear, right   . Shortness of breath dyspnea    not currently  . Ventricular fibrillation ([place])    a. VF arrest 2005 in setting of STEMI.  . Wears glasses        Allergies  Allergen Reactions  . Altace [Ramipril] Shortness Of Breath and Other (See Comments)    cough  . Penicillins Swelling    Has patient had a PCN reaction causing immediate rash, facial/tongue/throat swelling, SOB or lightheadedness with hypotension:unsure Has patient had a PCN reaction causing severe rash involving mucus membranes or skin necrosis:unsure Has patient had a PCN reaction that required hospitalization:No Has patient had a PCN reaction occurring within the last 10 years:NO If all of the above answers are "NO", then may proceed with Cephalosporin use.  Childhood reaction       Current Outpatient Medications  Medication Sig Dispense Refill  . acetaminophen (TYLENOL) 500 MG tablet Take 500 mg by mouth every 6 (six) hours as needed (PAIN).     Marland Kitchen aspirin 81 MG tablet Take 1 tablet (81 mg total) by mouth daily.    . cetirizine (ZYRTEC) 10 MG tablet Take 10 mg by mouth daily as needed.     . cholecalciferol (VITAMIN D) 1000 UNITS tablet Take 2,000 Units by mouth daily.     . clopidogrel (PLAVIX) 75 MG tablet Take 1 tablet (75 mg total) by mouth daily. 90 tablet 3  . isosorbide mononitrate (IMDUR) 30  MG 24 hr tablet Take one (1) tablet (30 mg) by mouth daily. 90 tablet 2  . metoprolol succinate (TOPROL-XL) 25 MG 24 hr tablet TAKE 1/2 TABLET EVERY DAY 45 tablet 2  . rosuvastatin (CRESTOR) 20 MG tablet TAKE 1 TABLET ONE TIME DAILY (DOSE INCREASE) 90 tablet 2   No current facility-administered medications for this visit.      Past Surgical History:  Procedure Laterality Date  . BACK SURGERY    . CARDIAC CATHETERIZATION     stents x2  . CARDIAC CATHETERIZATION N/A 10/02/2014   Procedure: Left Heart Cath And Coronary Angiography;  Surgeon: Larey Dresser, MD;  Location: [facility] INVASIVE CV LAB CUPID;  Service: Cardiovascular;  Laterality: N/A;  . CARDIAC CATHETERIZATION N/A 02/17/2016   Procedure: Left Heart Cath and Coronary Angiography;  Surgeon: Larey Dresser,  MD;  Location: [place] CV LAB;  Service: Cardiovascular;  Laterality: N/A;  . CORONARY ARTERY BYPASS GRAFT N/A 03/01/2016   Procedure: CORONARY ARTERY BYPASS GRAFTING (CABG)x3 with endoscopic harvesting of right saphenous vein -LIMA to LAD -SVG to LEFT CIRCUMFLEX -RADIAL ARTERY to RAMUS INTERMEDIA;  Surgeon: Grace Isaac, MD;  Location: [place];  Service: Open Heart Surgery;  Laterality: N/A;  . CORONARY STENT PLACEMENT    . CYSTOSCOPY/URETEROSCOPY/HOLMIUM LASER/STENT PLACEMENT Bilateral 05/29/2018   Procedure: CYSTOSCOPY/RETROGRADE/URETEROSCOPY/HOLMIUM LASER/STENT PLACEMENT/ BASKET STONE EXTRACTION;  Surgeon: [name], MD;  Location: [facility];  Service: Urology;  Laterality: Bilateral;  . EYE EXAMINATION UNDER ANESTHESIA W/ RETINAL CRYOTHERAPY AND RETINAL LASER    . HAND SURGERY Left    hamick bone rem  . KNEE ARTHROSCOPY Right 10/24/2014   Procedure: ARTHROSCOPY RIGHT KNEE, Partial medial menisectomy and chondroplasty;  Surgeon: Melrose Nakayama, MD;  Location: [place];  Service: Orthopedics;  Laterality: Right;  . LEFT HEART CATH N/A 10/11/2012   Procedure: LEFT HEART CATH;  Surgeon: Sherren Mocha, MD;  Location: [facility] CATH LAB;  Service: Cardiovascular;  Laterality: N/A;  . LITHOTRIPSY    . RADIAL ARTERY HARVEST Left 03/01/2016   Procedure: LEFT RADIAL ARTERY HARVEST;  Surgeon: Grace Isaac, MD;  Location: [place];  Service: Open Heart Surgery;  Laterality: Left;  . SPLIT NIGHT STUDY  09/21/2015  . TEE WITHOUT CARDIOVERSION N/A 03/01/2016   Procedure: TRANSESOPHAGEAL ECHOCARDIOGRAM (TEE);  Surgeon: Grace Isaac, MD;  Location: [place];  Service: Open Heart Surgery;  Laterality: N/A;  . TONSILLECTOMY       Allergies  Allergen Reactions  . Altace [Ramipril] Shortness Of Breath and Other (See Comments)    cough  . Penicillins Swelling    Has patient had a PCN reaction causing immediate rash, facial/tongue/throat swelling, SOB or lightheadedness with  hypotension:unsure Has patient had a PCN reaction causing severe rash involving mucus membranes or skin necrosis:unsure Has patient had a PCN reaction that required hospitalization:No Has patient had a PCN reaction occurring within the last 10 years:NO If all of the above answers are "NO", then may proceed with Cephalosporin use.  Childhood reaction        Family History  Problem Relation Age of Onset  . Heart disease Mother   . Stroke Father   . Healthy Brother      Social History Mr. Cottrill reports that he quit smoking about 16 years ago. His smoking use included cigars and cigarettes. He has a 5.00 pack-year smoking history. He has never used smokeless tobacco. Mr. Sahagun reports previous alcohol use.   Review of Systems CONSTITUTIONAL: No weight loss, fever,  chills, weakness or fatigue.  HEENT: Eyes: No visual loss, blurred vision, double vision or yellow sclerae.No hearing loss, sneezing, congestion, runny nose or sore throat.  SKIN: No rash or itching.  CARDIOVASCULAR: per hpi RESPIRATORY: No shortness of breath, cough or sputum.  GASTROINTESTINAL: No anorexia, nausea, vomiting or diarrhea. No abdominal pain or blood.  GENITOURINARY: No burning on urination, no polyuria NEUROLOGICAL: No headache, dizziness, syncope, paralysis, ataxia, numbness or tingling in the extremities. No change in bowel or bladder control.  MUSCULOSKELETAL: No muscle, back pain, joint pain or stiffness.  LYMPHATICS: No enlarged nodes. No history of splenectomy.  PSYCHIATRIC: No history of depression or anxiety.  ENDOCRINOLOGIC: No reports of sweating, cold or heat intolerance. No polyuria or polydipsia.  Marland Kitchen   Physical Examination Vitals:   07/06/18 0937 07/06/18 0952  BP: 115/69 119/71  Pulse: 60 60  SpO2: 94% 95%   Vitals:   07/06/18 0937  Weight: 252 lb 3.2 oz (114.4 kg)  Height: 5\' 11"  (1.803 m)    Gen: resting comfortably, no acute distress HEENT: no scleral icterus, pupils  equal round and reactive, no palptable cervical adenopathy,  CV: RRR, 2/6 systolic murmur rusb, no jvd Resp: Clear to auscultation bilaterally GI: abdomen is soft, non-tender, non-distended, normal bowel sounds, no hepatosplenomegaly MSK: extremities are warm, no edema.  Skin: warm, no rash Neuro:  no focal deficits Psych: appropriate affect   Diagnostic Studies  08/2017 nuclear stress  Nuclear stress EF: 48%. Apical and anteroseptal wall hypokinesis  There was no ST segment deviation noted during stress.  Defect 1: There is a large defect of severe severity present in the mid anteroseptal, mid inferoseptal, mid inferior, mid inferolateral, apical anterior, apical septal, apical inferior, apical lateral and apex location.  This is an intermediate risk study. Appears to be large prior infarct pattern as described above. No ischemia identified.   Jan 2019 echo Study Conclusions  - Left ventricle: The cavity size was normal. Wall thickness was   increased in a pattern of mild LVH. There was mild focal basal   hypertrophy of the septum. Systolic function was mildly reduced.   The estimated ejection fraction was in the range of 45% to 50%.   There is akinesis of the mid-apicalanteroseptal myocardium.   Features are consistent with a pseudonormal left ventricular   filling pattern, with concomitant abnormal relaxation and   increased filling pressure (grade 2 diastolic dysfunction). - Aortic valve: Valve mobility was restricted. There was mild   stenosis. There was trivial regurgitation. - Ascending aorta: The ascending aorta was mildly dilated. - Pulmonary arteries: Systolic pressure was mildly increased. PA   peak pressure: 36 mm Hg (S).  Impressions:  - Akinesis of the distal septum/apex; overall mildly reduced LV   systolic function; moderate diastolic dysfunction; calcified   aortic valve with fixed noncoronary cusp; mild AS (mean gradient   12 mmHg) and trace AI; milldy  dilated ascending aorta; mild TR   with mildly elevated pulmonary pressure.   Assessment and Plan  1. CAD/ ICM - no recent symptoms - continue current meds - committed to indefinite DAPT due to long history of significant disease and interventions  2. Aortic stenosis - mild, continue to monitor.   3. HTN - at goal, continue current meds  4. Hyperlipidemia - at goal, continue statin  5. Upcoming procedures - no cardiac contraindication to any planned surgery or procedure. If needed may hold plavix 5 days prior and restart day after  F/u 6 months    Arnoldo Lenis, M.D.

## 2018-07-13 ENCOUNTER — Encounter: Payer: Self-pay | Admitting: Nurse Practitioner

## 2018-07-13 ENCOUNTER — Ambulatory Visit: Payer: Medicare HMO | Admitting: Nurse Practitioner

## 2018-07-13 ENCOUNTER — Telehealth: Payer: Self-pay

## 2018-07-13 VITALS — BP 126/84 | HR 72 | Ht 71.0 in | Wt 250.4 lb

## 2018-07-13 DIAGNOSIS — Z7901 Long term (current) use of anticoagulants: Secondary | ICD-10-CM | POA: Diagnosis not present

## 2018-07-13 DIAGNOSIS — Z1211 Encounter for screening for malignant neoplasm of colon: Secondary | ICD-10-CM | POA: Diagnosis not present

## 2018-07-13 MED ORDER — PEG 3350-KCL-NA BICARB-NACL 420 G PO SOLR: 4000.0000 mL | Freq: Once | ORAL | 0 refills | Status: AC

## 2018-07-13 NOTE — Progress Notes (Signed)
I agree with the above note, plan 

## 2018-07-13 NOTE — Progress Notes (Signed)
ASSESSMENT / PLAN:   76.  68 year old male for initial screening colonoscopy.  No alarm signs/symptoms nor family history of colon cancer.  -Patient will be scheduled for colonoscopy off plavix. The risks and benefits of colonoscopy with possible polypectomy were discussed and the patient agrees to proceed.   2. CAD / multiple PCIs / CABG. On plavix.  He has ischemic cardiomyopathy, echocardiogram January 2019 shows LVEF 45 to 50%. -Hold Plavix for 5 days before procedure - will instruct when and how to resume after procedure. Patient understands that there is a low but real risk of cardiovascular event such as heart attack, stroke, or embolism /  thrombosis, or ischemia while off Plavix. The patient consents to proceed. Will communicate by phone or EMR with patient's prescribing provider to confirm that holding Plavix is reasonable in this case.   3. Mild thrombocytopenia, new since late September 2019.  Platelets stable at 146.  -Recommend monitor counts, if remain low recommend imaging   HPI:    Chief Complaint:   Colon cancer screening  Patient is a 68 year old male with history of CAD/multiple PCIs/CABG on plavix, ICM, mild AS . He is new to the practice referred by PCP Darla Lesches, FNP for colon cancer screening.  Patient has never had a colonoscopy before.  He has no family history colon cancer.  He has no GI complaints.  Specifically patient denies abdominal pain, bowel changes, blood in stool, unexplained weight loss.  Based on labs 06/25/2018 he is not anemic. He needs a tooth extracted, plans to take care of this while off Plavix.  Patient was previously followed by Cardiologist Dr. Saunders Revel, now sees Dr. Harl Bowie.  Was seen by Dr. Harl Bowie earlier this month.  He has no recent GI symptoms.  Reading Dr. Nelly Laurence note , there are no cardiac contraindications to any planned surgeries or procedures and Plavix can be held for 5 days.  Data Reviewed:  06/25/2018 AST 45 remaining  liver test normal WBC 8.7, hemoglobin 14.5, MCV 95, platelets low at 146   Past Medical History:  Diagnosis Date  . Anginal pain ([place])    occ  . Aortic stenosis 06/16/2017   Mild, noted on ECHO  . Arthritis   . Atrial fibrillation ([place]) 02/2016   post op  . CAD (coronary artery disease)    a. Anterior STEMI 2005 c/b vfib arrest, PCI to LAD at [facility]. b. Stent thrombosis 2006 with DES within prior LAD stent at [facility]. C. 09/2012: s/p balloon angioplasty to mLAD for severe stenosis in previously stented segment & DES to LCx; initial enz neg but ruled in for NSTEMI after post-cath vagal sx, felt d/t distal emboliz of thrombus during case.  . Chronic back pain   . Dependent edema    Mild, occ  . Elevated hemidiaphragm    a. Noted 09/2012 - instructed to f/u PCP.  Marland Kitchen GERD (gastroesophageal reflux disease)   . Heart murmur   . History of kidney stones   . Hyperlipidemia   . Hypertension   . Ischemic cardiomyopathy    a. Unclear prior EF but pt was told heart was weakened in past. b. EF normal 09/2012.  Marland Kitchen Myocardial infarction ([place])   . Retinal tear, right   . Shortness of breath dyspnea    not currently  . Ventricular fibrillation ([place])    a. VF arrest 2005 in setting of STEMI.  . Wears  glasses      Past Surgical History:  Procedure Laterality Date  . BACK SURGERY    . CARDIAC CATHETERIZATION     stents x2  . CARDIAC CATHETERIZATION N/A 10/02/2014   Procedure: Left Heart Cath And Coronary Angiography;  Surgeon: Larey Dresser, MD;  Location: [facility] INVASIVE CV LAB CUPID;  Service: Cardiovascular;  Laterality: N/A;  . CARDIAC CATHETERIZATION N/A 02/17/2016   Procedure: Left Heart Cath and Coronary Angiography;  Surgeon: Larey Dresser, MD;  Location: [place] CV LAB;  Service: Cardiovascular;  Laterality: N/A;  . CORONARY ARTERY BYPASS GRAFT N/A 03/01/2016   Procedure: CORONARY ARTERY BYPASS GRAFTING (CABG)x3 with endoscopic harvesting of right saphenous vein -LIMA to LAD -SVG to LEFT  CIRCUMFLEX -RADIAL ARTERY to RAMUS INTERMEDIA;  Surgeon: Grace Isaac, MD;  Location: [place];  Service: Open Heart Surgery;  Laterality: N/A;  . CORONARY STENT PLACEMENT    . CYSTOSCOPY/URETEROSCOPY/HOLMIUM LASER/STENT PLACEMENT Bilateral 05/29/2018   Procedure: CYSTOSCOPY/RETROGRADE/URETEROSCOPY/HOLMIUM LASER/STENT PLACEMENT/ BASKET STONE EXTRACTION;  Surgeon: [name], MD;  Location: [facility];  Service: Urology;  Laterality: Bilateral;  . EYE EXAMINATION UNDER ANESTHESIA W/ RETINAL CRYOTHERAPY AND RETINAL LASER    . HAND SURGERY Left    hamick bone rem  . KNEE ARTHROSCOPY Right 10/24/2014   Procedure: ARTHROSCOPY RIGHT KNEE, Partial medial menisectomy and chondroplasty;  Surgeon: Melrose Nakayama, MD;  Location: [place];  Service: Orthopedics;  Laterality: Right;  . LEFT HEART CATH N/A 10/11/2012   Procedure: LEFT HEART CATH;  Surgeon: Sherren Mocha, MD;  Location: [facility] CATH LAB;  Service: Cardiovascular;  Laterality: N/A;  . LITHOTRIPSY    . RADIAL ARTERY HARVEST Left 03/01/2016   Procedure: LEFT RADIAL ARTERY HARVEST;  Surgeon: Grace Isaac, MD;  Location: [place];  Service: Open Heart Surgery;  Laterality: Left;  . SPLIT NIGHT STUDY  09/21/2015  . TEE WITHOUT CARDIOVERSION N/A 03/01/2016   Procedure: TRANSESOPHAGEAL ECHOCARDIOGRAM (TEE);  Surgeon: Grace Isaac, MD;  Location: [place];  Service: Open Heart Surgery;  Laterality: N/A;  . TONSILLECTOMY     Family History  Problem Relation Age of Onset  . Heart disease Mother   . Stroke Father   . Healthy Brother    Social History   Tobacco Use  . Smoking status: Former Smoker    Packs/day: 0.50    Years: 10.00    Pack years: 5.00    Types: Cigars, Cigarettes    Last attempt to quit: 05/30/2002    Years since quitting: 16.1  . Smokeless tobacco: Never Used  Substance Use Topics  . Alcohol use: Not Currently    Comment: occ  . Drug use: No   Current Outpatient Medications  Medication Sig Dispense  Refill  . acetaminophen (TYLENOL) 500 MG tablet Take 500 mg by mouth every 6 (six) hours as needed (PAIN).     Marland Kitchen aspirin 81 MG tablet Take 1 tablet (81 mg total) by mouth daily.    . cetirizine (ZYRTEC) 10 MG tablet Take 10 mg by mouth daily as needed.     . cholecalciferol (VITAMIN D) 1000 UNITS tablet Take 2,000 Units by mouth daily.     . clopidogrel (PLAVIX) 75 MG tablet Take 1 tablet (75 mg total) by mouth daily. 90 tablet 3  . isosorbide mononitrate (IMDUR) 30 MG 24 hr tablet Take one (1) tablet (30 mg) by mouth daily. 90 tablet 2  . metoprolol succinate (TOPROL-XL) 25 MG 24 hr tablet TAKE 1/2 TABLET EVERY DAY  45 tablet 2  . rosuvastatin (CRESTOR) 20 MG tablet TAKE 1 TABLET ONE TIME DAILY (DOSE INCREASE) 90 tablet 2   No current facility-administered medications for this visit.    Allergies  Allergen Reactions  . Altace [Ramipril] Shortness Of Breath and Other (See Comments)    cough  . Penicillins Swelling    Has patient had a PCN reaction causing immediate rash, facial/tongue/throat swelling, SOB or lightheadedness with hypotension:unsure Has patient had a PCN reaction causing severe rash involving mucus membranes or skin necrosis:unsure Has patient had a PCN reaction that required hospitalization:No Has patient had a PCN reaction occurring within the last 10 years:NO If all of the above answers are "NO", then may proceed with Cephalosporin use.  Childhood reaction       Review of Systems: All systems reviewed and negative except where noted in HPI.   Serum creatinine: 0.94 mg/dL 06/25/18 0853 Estimated creatinine clearance: 96.4 mL/min   Physical Exam:    Wt Readings from Last 3 Encounters:  07/13/18 250 lb 6 oz (113.6 kg)  07/06/18 252 lb 3.2 oz (114.4 kg)  06/25/18 253 lb (114.8 kg)    BP 126/84   Pulse 72   Ht 5\' 11"  (1.803 m)   Wt 250 lb 6 oz (113.6 kg)   BMI 34.92 kg/m  Constitutional:  Pleasant male in no acute distress. Psychiatric: Normal mood and  affect. Behavior is normal. EENT: Pupils normal.  Conjunctivae are normal. No scleral icterus. Neck supple.  Cardiovascular: Normal rate, regular rhythm. No edema Pulmonary/chest: Effort normal and breath sounds normal. No wheezing, rales or rhonchi. Abdominal: Soft, nondistended, nontender. Bowel sounds active throughout. There are no masses palpable. No hepatomegaly. Neurological: Alert and oriented to person place and time. Skin: Skin is warm and dry. No rashes noted.  Tye Savoy, NP  07/13/2018, 10:46 AM  Cc: Baruch Gouty, FNP

## 2018-07-13 NOTE — Patient Instructions (Addendum)
If you are age 69 or older, your body mass index should be between 23-30. Your Body mass index is 34.92 kg/m. If this is out of the aforementioned range listed, please consider follow up with your Primary Care Provider.  If you are age 4 or younger, your body mass index should be between 19-25. Your Body mass index is 34.92 kg/m. If this is out of the aformentioned range listed, please consider follow up with your Primary Care Provider.   You have been scheduled for a colonoscopy. Please follow written instructions given to you at your visit today.  Please pick up your prep supplies at the pharmacy within the next 1-3 days. If you use inhalers (even only as needed), please bring them with you on the day of your procedure. Your physician has requested that you go to www.startemmi.com and enter the access code given to you at your visit today. This web site gives a general overview about your procedure. However, you should still follow specific instructions given to you by our office regarding your preparation for the procedure.  We have sent the following medications to your pharmacy for you to pick up at your convenience: Golytely  You will be contacted by our office prior to your procedure for directions on holding your Plavix.  If you do not hear from our office 1 week prior to your scheduled procedure, please call [phone] to discuss.    Thank you for choosing me and [facility].   Tye Savoy, NP

## 2018-07-13 NOTE — Telephone Encounter (Signed)
   Primary Cardiologist: Carlyle Dolly, MD  Chart reviewed as part of pre-operative protocol coverage.   As per Dr. Nelly Laurence note on 07/06/18: "No cardiac contraindication to any planned surgery or procedure. If needed may hold plavix 5 days prior and restart day after."   I will route this recommendation to the requesting party via [facility] fax function and remove from pre-op pool.  Please call with questions.  Ledora Bottcher, PA 07/13/2018, 12:37 PM

## 2018-07-13 NOTE — Telephone Encounter (Signed)
[facility] Pre-operative Risk Assessment     Request for surgical clearance:     Endoscopy Procedure  What type of surgery is being performed?     Colonoscopy  When is this surgery scheduled?     08/01/18  What type of clearance is required ?   Pharmacy  Are there any medications that need to be held prior to surgery and how long? HOLD PLAVIX FIVE DAYS PRIOR  Practice name and name of physician performing surgery?      [facility]/ Dr. Ardis Hughs  What is your office phone and fax number?      Phone- [phone]  Fax765 [phone]  Anesthesia type (None, local, MAC, general) ?       MAC

## 2018-07-17 NOTE — Telephone Encounter (Signed)
Spoke with patient today regarding HOLDING Plavix five days prior to colonoscopy.  Per Dr Harl Bowie okay to hold five days and restart after.  Patient verbalized understanding.

## 2018-07-18 ENCOUNTER — Encounter: Payer: Self-pay | Admitting: Gastroenterology

## 2018-07-25 ENCOUNTER — Telehealth: Payer: Self-pay | Admitting: Nurse Practitioner

## 2018-07-25 NOTE — Telephone Encounter (Signed)
Patient has questions regarding medication (antibiotics) dentist wants him to take before procedure on 3/4 states that he's scheduled for a tooth extraction the day after the procedure.

## 2018-07-25 NOTE — Telephone Encounter (Signed)
I agree, thank you.  He should take the antibiotics as recommended by his dentist

## 2018-07-25 NOTE — Telephone Encounter (Signed)
Dentist is having the patient take antibiotics for 7 days prior to the tooth extraction. He does not have an infection. This is a preventative. He wanted to be certain this would not affect the colonoscopy. Encouraged to take the antibiotics as ordered. It will not affect the colonoscopy.

## 2018-07-30 ENCOUNTER — Encounter: Payer: Medicare HMO | Admitting: Gastroenterology

## 2018-07-30 ENCOUNTER — Telehealth: Payer: Self-pay | Admitting: Gastroenterology

## 2018-07-30 NOTE — Telephone Encounter (Signed)
Spoke to Hayden Rivera. He had an episode of vertigo from Saturday night until Sunday morning. Doing better now. Verified with Frederich Chick, ok to proceed with procedure as scheduled.

## 2018-07-30 NOTE — Telephone Encounter (Signed)
Pt has questions regarding his upcoming colon 3.4.20.

## 2018-07-30 NOTE — Telephone Encounter (Signed)
Left a message to return call.  

## 2018-08-01 ENCOUNTER — Ambulatory Visit: Payer: Medicare HMO

## 2018-08-01 ENCOUNTER — Other Ambulatory Visit: Payer: Self-pay

## 2018-08-01 ENCOUNTER — Ambulatory Visit (AMBULATORY_SURGERY_CENTER): Payer: Medicare HMO | Admitting: Gastroenterology

## 2018-08-01 ENCOUNTER — Encounter: Payer: Self-pay | Admitting: Gastroenterology

## 2018-08-01 VITALS — BP 115/73 | HR 47 | Temp 98.4°F | Resp 16 | Ht 71.0 in | Wt 250.0 lb

## 2018-08-01 DIAGNOSIS — D123 Benign neoplasm of transverse colon: Secondary | ICD-10-CM | POA: Diagnosis not present

## 2018-08-01 DIAGNOSIS — D122 Benign neoplasm of ascending colon: Secondary | ICD-10-CM

## 2018-08-01 DIAGNOSIS — E785 Hyperlipidemia, unspecified: Secondary | ICD-10-CM | POA: Diagnosis not present

## 2018-08-01 DIAGNOSIS — I429 Cardiomyopathy, unspecified: Secondary | ICD-10-CM | POA: Diagnosis not present

## 2018-08-01 DIAGNOSIS — D124 Benign neoplasm of descending colon: Secondary | ICD-10-CM | POA: Diagnosis not present

## 2018-08-01 DIAGNOSIS — I10 Essential (primary) hypertension: Secondary | ICD-10-CM | POA: Diagnosis not present

## 2018-08-01 DIAGNOSIS — Z1211 Encounter for screening for malignant neoplasm of colon: Secondary | ICD-10-CM | POA: Diagnosis not present

## 2018-08-01 DIAGNOSIS — D120 Benign neoplasm of cecum: Secondary | ICD-10-CM | POA: Diagnosis not present

## 2018-08-01 DIAGNOSIS — I272 Pulmonary hypertension, unspecified: Secondary | ICD-10-CM | POA: Diagnosis not present

## 2018-08-01 DIAGNOSIS — K635 Polyp of colon: Secondary | ICD-10-CM

## 2018-08-01 DIAGNOSIS — I2510 Atherosclerotic heart disease of native coronary artery without angina pectoris: Secondary | ICD-10-CM | POA: Diagnosis not present

## 2018-08-01 MED ORDER — SODIUM CHLORIDE 0.9 % IV SOLN: 500.0000 mL | Freq: Once | INTRAVENOUS | Status: DC

## 2018-08-01 NOTE — Op Note (Signed)
[facility] Patient Name: Hayden Rivera Procedure Date: 08/01/2018 10:38 AM MRN: [id number] Endoscopist: Milus Banister , MD Age: 68 Referring MD:  Date of Birth: 05-12-1951 Gender: Male Account #: [id number] Procedure:                Colonoscopy Indications:              Screening for colorectal malignant neoplasm Medicines:                Monitored Anesthesia Care Procedure:                Pre-Anesthesia Assessment:                           - Prior to the procedure, a History and Physical                            was performed, and patient medications and                            allergies were reviewed. The patient's tolerance of                            previous anesthesia was also reviewed. The risks                            and benefits of the procedure and the sedation                            options and risks were discussed with the patient.                            All questions were answered, and informed consent                            was obtained. Prior Anticoagulants: The patient has                            taken Plavix (clopidogrel), last dose was 5 days                            prior to procedure. ASA Grade Assessment: III - A                            patient with severe systemic disease. After                            reviewing the risks and benefits, the patient was                            deemed in satisfactory condition to undergo the                            procedure.  After obtaining informed consent, the colonoscope                            was passed under direct vision. Throughout the                            procedure, the patient's blood pressure, pulse, and                            oxygen saturations were monitored continuously. The                            Colonoscope was introduced through the anus and                            advanced to the the cecum, identified by                 appendiceal orifice and ileocecal valve. The                            colonoscopy was performed without difficulty. The                            patient tolerated the procedure well. The quality                            of the bowel preparation was good. The ileocecal                            valve, appendiceal orifice, and rectum were                            photographed. Scope In: 10:47:03 AM Scope Out: 11:23:46 AM Scope Withdrawal Time: 0 hours 32 minutes 49 seconds  Total Procedure Duration: 0 hours 36 minutes 43 seconds  Findings:                 Seven sessile polyps were found in the transverse                            colon, ascending colon and cecum. The polyps were 2                            to 9 mm in size. These polyps were removed with a                            cold snare. Resection and retrieval were complete.                            jar 1                           A 20 mm polyp was found in the hepatic flexure. The  polyp was sessile. The polyp was removed with a                            piecemeal technique using a hot snare. Resection                            and retrieval were complete. The site was tattooed                            with an injection of 1 mL of indocyanine green. jar                            Four sessile polyps were found in the descending                            colon. The polyps were 3 to 7 mm in size. These                            polyps were removed with a cold snare. Resection                            and retrieval were complete. jar 3                           The exam was otherwise without abnormality on                            direct and retroflexion views. Complications:            No immediate complications. Estimated blood loss:                            None. Estimated Blood Loss:     Estimated blood loss: none. Impression:               - Seven 2 to 9 mm polyps  in the transverse colon,                            in the ascending colon and in the cecum, removed                            with a cold snare. Resected and retrieved.                           - One 20 mm polyp at the hepatic flexure, removed                            piecemeal using a hot snare. Resected and                            retrieved. The site was labeled with submucosal  injection of SPOT to aid in future localization.                           - Four 3 to 7 mm polyps in the descending colon,                            removed with a cold snare. Resected and retrieved.                           - The examination was otherwise normal on direct                            and retroflexion views. Recommendation:           - Patient has a contact number available for                            emergencies. The signs and symptoms of potential                            delayed complications were discussed with the                            patient. Return to normal activities tomorrow.                            Written discharge instructions were provided to the                            patient.                           - Resume previous diet.                           - Continue present medications. OK tyo restart                            plavix tomorrow.                           You will receive a letter within 2-3 weeks with the                            pathology results and my final recommendations.                            Likely you will need a repeat colonoscopy in 6                            months. Milus Banister, MD 08/01/2018 11:30:00 AM This report has been signed electronically.

## 2018-08-01 NOTE — Patient Instructions (Signed)
Discharge instructions given. Handout on polyps. Resume Plavix tomorrow. Resume previous medications today. YOU HAD AN ENDOSCOPIC PROCEDURE TODAY AT [facility]:   Refer to the procedure report that was given to you for any specific questions about what was found during the examination.  If the procedure report does not answer your questions, please call your gastroenterologist to clarify.  If you requested that your care partner not be given the details of your procedure findings, then the procedure report has been included in a sealed envelope for you to review at your convenience later.  YOU SHOULD EXPECT: Some feelings of bloating in the abdomen. Passage of more gas than usual.  Walking can help get rid of the air that was put into your GI tract during the procedure and reduce the bloating. If you had a lower endoscopy (such as a colonoscopy or flexible sigmoidoscopy) you may notice spotting of blood in your stool or on the toilet paper. If you underwent a bowel prep for your procedure, you may not have a normal bowel movement for a few days.  Please Note:  You might notice some irritation and congestion in your nose or some drainage.  This is from the oxygen used during your procedure.  There is no need for concern and it should clear up in a day or so.  SYMPTOMS TO REPORT IMMEDIATELY:   Following lower endoscopy (colonoscopy or flexible sigmoidoscopy):  Excessive amounts of blood in the stool  Significant tenderness or worsening of abdominal pains  Swelling of the abdomen that is new, acute  Fever of 100F or higher   For urgent or emergent issues, a gastroenterologist can be reached at any hour by calling [phone].   DIET:  We do recommend a small meal at first, but then you may proceed to your regular diet.  Drink plenty of fluids but you should avoid alcoholic beverages for 24 hours.  ACTIVITY:  You should plan to take it easy for the rest of today and you  should NOT DRIVE or use heavy machinery until tomorrow (because of the sedation medicines used during the test).    FOLLOW UP: Our staff will call the number listed on your records the next business day following your procedure to check on you and address any questions or concerns that you may have regarding the information given to you following your procedure. If we do not reach you, we will leave a message.  However, if you are feeling well and you are not experiencing any problems, there is no need to return our call.  We will assume that you have returned to your regular daily activities without incident.  If any biopsies were taken you will be contacted by phone or by letter within the next 1-3 weeks.  Please call us at [phone] if you have not heard about the biopsies in 3 weeks.    SIGNATURES/CONFIDENTIALITY: You and/or your care partner have signed paperwork which will be entered into your electronic medical record.  These signatures attest to the fact that that the information above on your After Visit Summary has been reviewed and is understood.  Full responsibility of the confidentiality of this discharge information lies with you and/or your care-partner.

## 2018-08-01 NOTE — Progress Notes (Signed)
Called to room to assist during endoscopic procedure.  Patient ID and intended procedure confirmed with present staff. Received instructions for my participation in the procedure from the performing physician.  

## 2018-08-01 NOTE — Progress Notes (Signed)
Pt's states no medical or surgical changes since previsit or office visit. 

## 2018-08-01 NOTE — Progress Notes (Signed)
Report to PACU, RN, vss, BBS= Clear.  

## 2018-08-02 ENCOUNTER — Telehealth: Payer: Self-pay

## 2018-08-02 NOTE — Telephone Encounter (Signed)
  Follow up Call-  Call back number 08/01/2018  Post procedure Call Back phone  # [phone]  Permission to leave phone message Yes  Some recent data might be hidden     Patient questions:  Do you have a fever, pain , or abdominal swelling? No. Pain Score  0 *  Have you tolerated food without any problems? Yes.    Have you been able to return to your normal activities? Yes.    Do you have any questions about your discharge instructions: Diet   No. Medications  No. Follow up visit  No.  Do you have questions or concerns about your Care? No.  Actions: * If pain score is 4 or above: No action needed, pain <4.

## 2018-08-08 ENCOUNTER — Encounter: Payer: Self-pay | Admitting: Gastroenterology

## 2018-08-18 ENCOUNTER — Other Ambulatory Visit: Payer: Self-pay | Admitting: Internal Medicine

## 2018-08-20 NOTE — Telephone Encounter (Signed)
Refill Request.  

## 2018-09-21 ENCOUNTER — Other Ambulatory Visit: Payer: Self-pay | Admitting: Internal Medicine

## 2018-09-21 NOTE — Telephone Encounter (Signed)
Refill Request.  

## 2018-09-25 ENCOUNTER — Other Ambulatory Visit: Payer: Self-pay | Admitting: Internal Medicine

## 2018-10-09 DIAGNOSIS — Z03818 Encounter for observation for suspected exposure to other biological agents ruled out: Secondary | ICD-10-CM | POA: Diagnosis not present

## 2018-11-16 ENCOUNTER — Other Ambulatory Visit: Payer: Self-pay | Admitting: Internal Medicine

## 2018-11-16 ENCOUNTER — Encounter: Payer: Self-pay | Admitting: Family Medicine

## 2018-11-16 ENCOUNTER — Ambulatory Visit (INDEPENDENT_AMBULATORY_CARE_PROVIDER_SITE_OTHER): Payer: Medicare HMO | Admitting: Family Medicine

## 2018-11-16 DIAGNOSIS — R42 Dizziness and giddiness: Secondary | ICD-10-CM

## 2018-11-16 MED ORDER — ONDANSETRON HCL 4 MG PO TABS: 4.0000 mg | ORAL_TABLET | Freq: Three times a day (TID) | ORAL | 0 refills | Status: DC | PRN

## 2018-11-16 MED ORDER — MECLIZINE HCL 12.5 MG PO TABS: 12.5000 mg | ORAL_TABLET | Freq: Three times a day (TID) | ORAL | 0 refills | Status: DC | PRN

## 2018-11-16 NOTE — Progress Notes (Signed)
Virtual Visit via telephone Note Due to COVID-19, visit is conducted virtually and was requested by patient. This visit type was conducted due to national recommendations for restrictions regarding the COVID-19 Pandemic (e.g. social distancing) in an effort to limit this patient's exposure and mitigate transmission in our community. All issues noted in this document were discussed and addressed.  A physical exam was not performed with this format.   I connected with Hayden Rivera on 11/16/18 at 0800 by telephone and verified that I am speaking with the correct person using two identifiers. Hayden Rivera is currently located at home and wife is currently with them during visit. The provider, Monia Pouch, FNP is located in their office at time of visit.  I discussed the limitations, risks, security and privacy concerns of performing an evaluation and management service by telephone and the availability of in person appointments. I also discussed with the patient that there may be a patient responsible charge related to this service. The patient expressed understanding and agreed to proceed.  Subjective:  Patient ID: Hayden Rivera, male    DOB: Mar 28, 1951, 68 y.o.   MRN: [id number]  Chief Complaint:  Dizziness   HPI: Hayden Rivera is a 68 y.o. male presenting on 11/16/2018 for Dizziness   Pt reports intermittent vertigo since the beginning of this year. States at times he has nausea and "feeling off balance" with the dizziness. He denies focal deficits, headaches, weakness, confusion, or slurred speech. States this comes and goes at random and is sometimes worse with movements.   Dizziness This is a chronic problem. The current episode started more than 1 month ago. The problem occurs intermittently. The problem has been waxing and waning. Associated symptoms include nausea and vertigo. Pertinent negatives include no abdominal pain, anorexia, arthralgias, change in bowel habit, chest pain, chills,  congestion, coughing, diaphoresis, fatigue, fever, headaches, joint swelling, myalgias, neck pain, numbness, rash, sore throat, swollen glands, urinary symptoms, visual change, vomiting or weakness. The symptoms are aggravated by walking, twisting and standing. He has tried nothing for the symptoms.     Relevant past medical, surgical, family, and social history reviewed and updated as indicated.  Allergies and medications reviewed and updated.   Past Medical History:  Diagnosis Date  . Anginal pain ([place])    occ  . Aortic stenosis 06/16/2017   Mild, noted on ECHO  . Arthritis   . Atrial fibrillation ([place]) 02/2016   post op  . CAD (coronary artery disease)    a. Anterior STEMI 2005 c/b vfib arrest, PCI to LAD at [facility]. b. Stent thrombosis 2006 with DES within prior LAD stent at [facility]. C. 09/2012: s/p balloon angioplasty to mLAD for severe stenosis in previously stented segment & DES to LCx; initial enz neg but ruled in for NSTEMI after post-cath vagal sx, felt d/t distal emboliz of thrombus during case.  . Chronic back pain   . Dependent edema    Mild, occ  . Elevated hemidiaphragm    a. Noted 09/2012 - instructed to f/u PCP.  Marland Kitchen GERD (gastroesophageal reflux disease)   . Heart murmur   . History of kidney stones   . Hyperlipidemia   . Hypertension   . Ischemic cardiomyopathy    a. Unclear prior EF but pt was told heart was weakened in past. b. EF normal 09/2012.  Marland Kitchen Myocardial infarction ([place])   . Retinal tear, right   . Shortness of breath dyspnea    not currently  . 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 VF arrest 2005 in setting of STEMI.  . Wears glasses     Past Surgical History:  Procedure Laterality Date  . BACK SURGERY    . CARDIAC CATHETERIZATION     stents x2  . CARDIAC CATHETERIZATION N/A 10/02/2014   Procedure: Left Heart Cath And Coronary Angiography;  Surgeon: Larey Dresser, MD;  Location: [facility] INVASIVE CV LAB CUPID;  Service: Cardiovascular;  Laterality: N/A;  .  CARDIAC CATHETERIZATION N/A 02/17/2016   Procedure: Left Heart Cath and Coronary Angiography;  Surgeon: Larey Dresser, MD;  Location: [place] CV LAB;  Service: Cardiovascular;  Laterality: N/A;  . CORONARY ARTERY BYPASS GRAFT N/A 03/01/2016   Procedure: CORONARY ARTERY BYPASS GRAFTING (CABG)x3 with endoscopic harvesting of right saphenous vein -LIMA to LAD -SVG to LEFT CIRCUMFLEX -RADIAL ARTERY to RAMUS INTERMEDIA;  Surgeon: Grace Isaac, MD;  Location: [place];  Service: Open Heart Surgery;  Laterality: N/A;  . CORONARY STENT PLACEMENT    . CYSTOSCOPY/URETEROSCOPY/HOLMIUM LASER/STENT PLACEMENT Bilateral 05/29/2018   Procedure: CYSTOSCOPY/RETROGRADE/URETEROSCOPY/HOLMIUM LASER/STENT PLACEMENT/ BASKET STONE EXTRACTION;  Surgeon: [name], MD;  Location: [facility];  Service: Urology;  Laterality: Bilateral;  . EYE EXAMINATION UNDER ANESTHESIA W/ RETINAL CRYOTHERAPY AND RETINAL LASER    . HAND SURGERY Left    hamick bone rem  . KNEE ARTHROSCOPY Right 10/24/2014   Procedure: ARTHROSCOPY RIGHT KNEE, Partial medial menisectomy and chondroplasty;  Surgeon: Melrose Nakayama, MD;  Location: [facility];  Service: Orthopedics;  Laterality: Right;  . LEFT HEART CATH N/A 10/11/2012   Procedure: LEFT HEART CATH;  Surgeon: Sherren Mocha, MD;  Location: [facility] CATH LAB;  Service: Cardiovascular;  Laterality: N/A;  . LITHOTRIPSY    . RADIAL ARTERY HARVEST Left 03/01/2016   Procedure: LEFT RADIAL ARTERY HARVEST;  Surgeon: Grace Isaac, MD;  Location: [place];  Service: Open Heart Surgery;  Laterality: Left;  . SPLIT NIGHT STUDY  09/21/2015  . TEE WITHOUT CARDIOVERSION N/A 03/01/2016   Procedure: TRANSESOPHAGEAL ECHOCARDIOGRAM (TEE);  Surgeon: Grace Isaac, MD;  Location: [place];  Service: Open Heart Surgery;  Laterality: N/A;  . TONSILLECTOMY      Social History   Socioeconomic History  . Marital status: Married    Spouse name: Not on file  . Number of children: Not on file  .  Years of education: Not on file  . Highest education level: Not on file  Occupational History  . Occupation: retired  Scientific laboratory technician  . Financial resource strain: Not on file  . Food insecurity    Worry: Not on file    Inability: Not on file  . Transportation needs    Medical: Not on file    Non-medical: Not on file  Tobacco Use  . Smoking status: Former Smoker    Packs/day: 0.50    Years: 10.00    Pack years: 5.00    Types: Cigars, Cigarettes    Quit date: 05/30/2002    Years since quitting: 16.4  . Smokeless tobacco: Never Used  Substance and Sexual Activity  . Alcohol use: Not Currently    Comment: occ  . Drug use: No  . Sexual activity: Not Currently  Lifestyle  . Physical activity    Days per week: Not on file    Minutes per session: Not on file  . Stress: Not on file  Relationships  . Social Herbalist on phone: Not on file    Gets together: Not on file  Attends religious service: Not on file    Active member of club or organization: Not on file    Attends meetings of clubs or organizations: Not on file    Relationship status: Not on file  . Intimate partner violence    Fear of current or ex partner: Not on file    Emotionally abused: Not on file    Physically abused: Not on file    Forced sexual activity: Not on file  Other Topics Concern  . Not on file  Social History Narrative  . Not on file    Outpatient Encounter Medications as of 11/16/2018  Medication Sig  . acetaminophen (TYLENOL) 500 MG tablet Take 500 mg by mouth every 6 (six) hours as needed (PAIN).   Marland Kitchen aspirin 81 MG tablet Take 1 tablet (81 mg total) by mouth daily.  . cetirizine (ZYRTEC) 10 MG tablet Take 10 mg by mouth daily as needed.   . cholecalciferol (VITAMIN D) 1000 UNITS tablet Take 2,000 Units by mouth daily.   . clopidogrel (PLAVIX) 75 MG tablet TAKE 1 TABLET (75 MG TOTAL) BY MOUTH DAILY.  . isosorbide mononitrate (IMDUR) 30 MG 24 hr tablet TAKE 1 TABLET EVERY DAY  .  meclizine (ANTIVERT) 12.5 MG tablet Take 1 tablet (12.5 mg total) by mouth 3 (three) times daily as needed for dizziness.  . metoprolol succinate (TOPROL-XL) 25 MG 24 hr tablet TAKE 1/2 TABLET EVERY DAY  . ondansetron (ZOFRAN) 4 MG tablet Take 1 tablet (4 mg total) by mouth every 8 (eight) hours as needed for nausea or vomiting.  . rosuvastatin (CRESTOR) 20 MG tablet TAKE 1 TABLET ONE TIME DAILY (DOSE INCREASE)   No facility-administered encounter medications on file as of 11/16/2018.     Allergies  Allergen Reactions  . Altace [Ramipril] Shortness Of Breath and Other (See Comments)    cough  . Penicillins Swelling    Has patient had a PCN reaction causing immediate rash, facial/tongue/throat swelling, SOB or lightheadedness with hypotension:unsure Has patient had a PCN reaction causing severe rash involving mucus membranes or skin necrosis:unsure Has patient had a PCN reaction that required hospitalization:No Has patient had a PCN reaction occurring within the last 10 years:NO If all of the above answers are "NO", then may proceed with Cephalosporin use.  Childhood reaction      Review of Systems  Constitutional: Negative for chills, diaphoresis, fatigue and fever.  HENT: Negative for congestion and sore throat.   Eyes: Negative for photophobia and visual disturbance.  Respiratory: Negative for cough, chest tightness and shortness of breath.   Cardiovascular: Negative for chest pain, palpitations and leg swelling.  Gastrointestinal: Positive for nausea. Negative for abdominal pain, anal bleeding, anorexia, blood in stool, change in bowel habit and vomiting.  Genitourinary: Negative for decreased urine volume, difficulty urinating and hematuria.  Musculoskeletal: Negative for arthralgias, joint swelling, myalgias and neck pain.  Skin: Negative for color change and rash.  Neurological: Positive for dizziness and vertigo. Negative for tremors, seizures, syncope, facial asymmetry,  speech difficulty, weakness, light-headedness, numbness and headaches.  Hematological: Does not bruise/bleed easily.  Psychiatric/Behavioral: Negative for confusion.  All other systems reviewed and are negative.        Observations/Objective: No vital signs or physical exam, this was a telephone or virtual health encounter.  Pt alert and oriented, answers all questions appropriately, and able to speak in full sentences.    Assessment and Plan: Loyce was seen today for dizziness.  Diagnoses and all orders for  this visit:  Vertigo Reported signs and symptoms consistent with vertigo. No focal deficits.  Will treat with below. Pt aware to report any new or worsening symptoms. Pt aware of signs and symptoms that require emergent evaluation. Follow up in 2 weeks or sooner if needed.  -     meclizine (ANTIVERT) 12.5 MG tablet; Take 1 tablet (12.5 mg total) by mouth 3 (three) times daily as needed for dizziness. -     ondansetron (ZOFRAN) 4 MG tablet; Take 1 tablet (4 mg total) by mouth every 8 (eight) hours as needed for nausea or vomiting.     Follow Up Instructions: Return in about 2 weeks (around 11/30/2018), or if symptoms worsen or fail to improve, for Vertigo.    I discussed the assessment and treatment plan with the patient. The patient was provided an opportunity to ask questions and all were answered. The patient agreed with the plan and demonstrated an understanding of the instructions.   The patient was advised to call back or seek an in-person evaluation if the symptoms worsen or if the condition fails to improve as anticipated.  The above assessment and management plan was discussed with the patient. The patient verbalized understanding of and has agreed to the management plan. Patient is aware to call the clinic if symptoms persist or worsen. Patient is aware when to return to the clinic for a follow-up visit. Patient educated on when it is appropriate to go to the emergency  department.    I provided 15 minutes of non-face-to-face time during this encounter. The call started at 0800. The call ended at 0815. The other time was used for coordination of care.    Monia Pouch, FNP-C [facility] [address] [place], [place] [address] [phone]

## 2018-11-19 NOTE — Telephone Encounter (Signed)
Please review for refill.  

## 2018-11-29 ENCOUNTER — Ambulatory Visit (INDEPENDENT_AMBULATORY_CARE_PROVIDER_SITE_OTHER): Payer: Medicare HMO | Admitting: Family Medicine

## 2018-11-29 ENCOUNTER — Encounter: Payer: Self-pay | Admitting: Family Medicine

## 2018-11-29 ENCOUNTER — Other Ambulatory Visit: Payer: Self-pay

## 2018-11-29 DIAGNOSIS — R42 Dizziness and giddiness: Secondary | ICD-10-CM

## 2018-11-29 NOTE — Progress Notes (Signed)
Virtual Visit via telephone Note Due to COVID-19, visit is conducted virtually and was requested by patient. This visit type was conducted due to national recommendations for restrictions regarding the COVID-19 Pandemic (e.g. social distancing) in an effort to limit this patient's exposure and mitigate transmission in our community. All issues noted in this document were discussed and addressed.  A physical exam was not performed with this format.   I connected with Hayden Rivera on 11/29/18 at 0930 by telephone and verified that I am speaking with the correct person using two identifiers. Hayden Rivera is currently located at home and family is currently with them during visit. The provider, Monia Pouch, FNP is located in their office at time of visit.  I discussed the limitations, risks, security and privacy concerns of performing an evaluation and management service by telephone and the availability of in person appointments. I also discussed with the patient that there may be a patient responsible charge related to this service. The patient expressed understanding and agreed to proceed.  Subjective:  Patient ID: Hayden Rivera, male    DOB: 30-May-1951, 68 y.o.   MRN: [id number]  Chief Complaint:  Dizziness   HPI: Hayden Rivera is a 68 y.o. male presenting on 11/29/2018 for Dizziness   Pt is following up today for Vertigo. Pt states he has been doing great since visit. He has not needed the Meclizine or Zofran as he has not had any more symptoms. No weakness, confusion, focal deficits, or chest pain.     Relevant past medical, surgical, family, and social history reviewed and updated as indicated.  Allergies and medications reviewed and updated.   Past Medical History:  Diagnosis Date  . Anginal pain ([place])    occ  . Aortic stenosis 06/16/2017   Mild, noted on ECHO  . Arthritis   . Atrial fibrillation ([place]) 02/2016   post op  . CAD (coronary artery disease)    a. Anterior STEMI  2005 c/b vfib arrest, PCI to LAD at [facility]. b. Stent thrombosis 2006 with DES within prior LAD stent at [facility]. C. 09/2012: s/p balloon angioplasty to mLAD for severe stenosis in previously stented segment & DES to LCx; initial enz neg but ruled in for NSTEMI after post-cath vagal sx, felt d/t distal emboliz of thrombus during case.  . Chronic back pain   . Dependent edema    Mild, occ  . Elevated hemidiaphragm    a. Noted 09/2012 - instructed to f/u PCP.  Marland Kitchen GERD (gastroesophageal reflux disease)   . Heart murmur   . History of kidney stones   . Hyperlipidemia   . Hypertension   . Ischemic cardiomyopathy    a. Unclear prior EF but pt was told heart was weakened in past. b. EF normal 09/2012.  Marland Kitchen Myocardial infarction ([place])   . Retinal tear, right   . Shortness of breath dyspnea    not currently  . Ventricular fibrillation ([place])    a. VF arrest 2005 in setting of STEMI.  . Wears glasses     Past Surgical History:  Procedure Laterality Date  . BACK SURGERY    . CARDIAC CATHETERIZATION     stents x2  . CARDIAC CATHETERIZATION N/A 10/02/2014   Procedure: Left Heart Cath And Coronary Angiography;  Surgeon: Larey Dresser, MD;  Location: [facility] INVASIVE CV LAB CUPID;  Service: Cardiovascular;  Laterality: N/A;  . CARDIAC CATHETERIZATION N/A 02/17/2016   Procedure: Left Heart Cath and Coronary Angiography;  Surgeon: Elby Showers  Aundra Dubin, MD;  Location: [place] CV LAB;  Service: Cardiovascular;  Laterality: N/A;  . CORONARY ARTERY BYPASS GRAFT N/A 03/01/2016   Procedure: CORONARY ARTERY BYPASS GRAFTING (CABG)x3 with endoscopic harvesting of right saphenous vein -LIMA to LAD -SVG to LEFT CIRCUMFLEX -RADIAL ARTERY to RAMUS INTERMEDIA;  Surgeon: Grace Isaac, MD;  Location: [place];  Service: Open Heart Surgery;  Laterality: N/A;  . CORONARY STENT PLACEMENT    . CYSTOSCOPY/URETEROSCOPY/HOLMIUM LASER/STENT PLACEMENT Bilateral 05/29/2018   Procedure: CYSTOSCOPY/RETROGRADE/URETEROSCOPY/HOLMIUM LASER/STENT  PLACEMENT/ BASKET STONE EXTRACTION;  Surgeon: [name], MD;  Location: [facility];  Service: Urology;  Laterality: Bilateral;  . EYE EXAMINATION UNDER ANESTHESIA W/ RETINAL CRYOTHERAPY AND RETINAL LASER    . HAND SURGERY Left    hamick bone rem  . KNEE ARTHROSCOPY Right 10/24/2014   Procedure: ARTHROSCOPY RIGHT KNEE, Partial medial menisectomy and chondroplasty;  Surgeon: Melrose Nakayama, MD;  Location: [place];  Service: Orthopedics;  Laterality: Right;  . LEFT HEART CATH N/A 10/11/2012   Procedure: LEFT HEART CATH;  Surgeon: Sherren Mocha, MD;  Location: [facility] CATH LAB;  Service: Cardiovascular;  Laterality: N/A;  . LITHOTRIPSY    . RADIAL ARTERY HARVEST Left 03/01/2016   Procedure: LEFT RADIAL ARTERY HARVEST;  Surgeon: Grace Isaac, MD;  Location: [place];  Service: Open Heart Surgery;  Laterality: Left;  . SPLIT NIGHT STUDY  09/21/2015  . TEE WITHOUT CARDIOVERSION N/A 03/01/2016   Procedure: TRANSESOPHAGEAL ECHOCARDIOGRAM (TEE);  Surgeon: Grace Isaac, MD;  Location: [place];  Service: Open Heart Surgery;  Laterality: N/A;  . TONSILLECTOMY      Social History   Socioeconomic History  . Marital status: Married    Spouse name: Not on file  . Number of children: Not on file  . Years of education: Not on file  . Highest education level: Not on file  Occupational History  . Occupation: retired  Scientific laboratory technician  . Financial resource strain: Not on file  . Food insecurity    Worry: Not on file    Inability: Not on file  . Transportation needs    Medical: Not on file    Non-medical: Not on file  Tobacco Use  . Smoking status: Former Smoker    Packs/day: 0.50    Years: 10.00    Pack years: 5.00    Types: Cigars, Cigarettes    Quit date: 05/30/2002    Years since quitting: 16.5  . Smokeless tobacco: Never Used  Substance and Sexual Activity  . Alcohol use: Not Currently    Comment: occ  . Drug use: No  . Sexual activity: Not Currently  Lifestyle  . Physical  activity    Days per week: Not on file    Minutes per session: Not on file  . Stress: Not on file  Relationships  . Social Herbalist on phone: Not on file    Gets together: Not on file    Attends religious service: Not on file    Active member of club or organization: Not on file    Attends meetings of clubs or organizations: Not on file    Relationship status: Not on file  . Intimate partner violence    Fear of current or ex partner: Not on file    Emotionally abused: Not on file    Physically abused: Not on file    Forced sexual activity: Not on file  Other Topics Concern  . Not on file  Social History  Narrative  . Not on file    Outpatient Encounter Medications as of 11/29/2018  Medication Sig  . acetaminophen (TYLENOL) 500 MG tablet Take 500 mg by mouth every 6 (six) hours as needed (PAIN).   Marland Kitchen aspirin 81 MG tablet Take 1 tablet (81 mg total) by mouth daily.  . cetirizine (ZYRTEC) 10 MG tablet Take 10 mg by mouth daily as needed.   . cholecalciferol (VITAMIN D) 1000 UNITS tablet Take 2,000 Units by mouth daily.   . clopidogrel (PLAVIX) 75 MG tablet TAKE 1 TABLET (75 MG TOTAL) BY MOUTH DAILY.  . isosorbide mononitrate (IMDUR) 30 MG 24 hr tablet TAKE 1 TABLET EVERY DAY  . meclizine (ANTIVERT) 12.5 MG tablet Take 1 tablet (12.5 mg total) by mouth 3 (three) times daily as needed for dizziness.  . metoprolol succinate (TOPROL-XL) 25 MG 24 hr tablet TAKE 1/2 TABLET EVERY DAY  . ondansetron (ZOFRAN) 4 MG tablet Take 1 tablet (4 mg total) by mouth every 8 (eight) hours as needed for nausea or vomiting.  . rosuvastatin (CRESTOR) 20 MG tablet TAKE 1 TABLET ONE TIME DAILY    No facility-administered encounter medications on file as of 11/29/2018.     Allergies  Allergen Reactions  . Altace [Ramipril] Shortness Of Breath and Other (See Comments)    cough  . Penicillins Swelling    Has patient had a PCN reaction causing immediate rash, facial/tongue/throat swelling, SOB or  lightheadedness with hypotension:unsure Has patient had a PCN reaction causing severe rash involving mucus membranes or skin necrosis:unsure Has patient had a PCN reaction that required hospitalization:No Has patient had a PCN reaction occurring within the last 10 years:NO If all of the above answers are "NO", then may proceed with Cephalosporin use.  Childhood reaction      Review of Systems  Constitutional: Negative for chills, fatigue and fever.  Respiratory: Negative for cough, chest tightness and shortness of breath.   Cardiovascular: Negative for chest pain and palpitations.  Neurological: Negative for dizziness, tremors, seizures, syncope, facial asymmetry, speech difficulty, weakness, light-headedness, numbness and headaches.  Psychiatric/Behavioral: Negative for confusion.  All other systems reviewed and are negative.        Observations/Objective: No vital signs or physical exam, this was a telephone or virtual health encounter.  Pt alert and oriented, answers all questions appropriately, and able to speak in full sentences.    Assessment and Plan: Hayden Rivera was seen today for dizziness.  Diagnoses and all orders for this visit:  Vertigo Symptoms have resolved. Report any return of symptoms or new symptoms.     Follow Up Instructions: Return in about 1 month (around 12/30/2018), or if symptoms worsen or fail to improve, for HTN, lipids.    I discussed the assessment and treatment plan with the patient. The patient was provided an opportunity to ask questions and all were answered. The patient agreed with the plan and demonstrated an understanding of the instructions.   The patient was advised to call back or seek an in-person evaluation if the symptoms worsen or if the condition fails to improve as anticipated.  The above assessment and management plan was discussed with the patient. The patient verbalized understanding of and has agreed to the management plan.  Patient is aware to call the clinic if symptoms persist or worsen. Patient is aware when to return to the clinic for a follow-up visit. Patient educated on when it is appropriate to go to the emergency department.    I provided 15  minutes of non-face-to-face time during this encounter. The call started at 0935. The call ended at 0945. The other time was used for coordination of care.    Monia Pouch, FNP-C [facility] [address] [place], [place] [address] [phone]

## 2018-12-28 ENCOUNTER — Other Ambulatory Visit: Payer: Self-pay | Admitting: Internal Medicine

## 2018-12-28 ENCOUNTER — Encounter: Payer: Self-pay | Admitting: Gastroenterology

## 2019-01-04 ENCOUNTER — Other Ambulatory Visit: Payer: Self-pay | Admitting: Internal Medicine

## 2019-01-04 DIAGNOSIS — I10 Essential (primary) hypertension: Secondary | ICD-10-CM

## 2019-01-04 DIAGNOSIS — Z951 Presence of aortocoronary bypass graft: Secondary | ICD-10-CM

## 2019-01-04 DIAGNOSIS — I480 Paroxysmal atrial fibrillation: Secondary | ICD-10-CM

## 2019-01-04 NOTE — Telephone Encounter (Signed)
Please review for refill.  

## 2019-01-09 ENCOUNTER — Encounter: Payer: Self-pay | Admitting: Cardiology

## 2019-01-09 ENCOUNTER — Other Ambulatory Visit: Payer: Self-pay

## 2019-01-09 ENCOUNTER — Encounter: Payer: Self-pay | Admitting: *Deleted

## 2019-01-09 ENCOUNTER — Ambulatory Visit (INDEPENDENT_AMBULATORY_CARE_PROVIDER_SITE_OTHER): Payer: Medicare HMO | Admitting: Cardiology

## 2019-01-09 DIAGNOSIS — I480 Paroxysmal atrial fibrillation: Secondary | ICD-10-CM | POA: Diagnosis not present

## 2019-01-09 DIAGNOSIS — Z951 Presence of aortocoronary bypass graft: Secondary | ICD-10-CM

## 2019-01-09 DIAGNOSIS — I10 Essential (primary) hypertension: Secondary | ICD-10-CM

## 2019-01-09 MED ORDER — CLOPIDOGREL BISULFATE 75 MG PO TABS: 75.0000 mg | ORAL_TABLET | Freq: Every day | ORAL | 3 refills | Status: DC

## 2019-01-09 MED ORDER — ROSUVASTATIN CALCIUM 20 MG PO TABS: 20.0000 mg | ORAL_TABLET | Freq: Every day | ORAL | 3 refills | Status: DC

## 2019-01-09 MED ORDER — METOPROLOL SUCCINATE ER 25 MG PO TB24: 12.5000 mg | ORAL_TABLET | Freq: Every day | ORAL | 3 refills | Status: DC

## 2019-01-09 MED ORDER — ISOSORBIDE MONONITRATE ER 30 MG PO TB24: 30.0000 mg | ORAL_TABLET | Freq: Every day | ORAL | 3 refills | Status: DC

## 2019-01-09 NOTE — Progress Notes (Signed)
Clinical Summary Hayden Rivera is a 68 y.o.male seen today for follow up of the following medical problems.   1. CAD - multiple PCIs, CABG in 02/2016 08/2017 nuclear stress no ischemia, large prior infarct   - no recent chest pain. Sedentary lifestyle, tolerates walking well. Some DOE with walking up inclinc, working to increase his activity. Limited by joint pains.    2. ICM - Jan 2019 echo LVEF 45-50%, akinesis mid-apicalanteroseptal wall. Grade II diastolic dysfunction  - no recent edema.    3. Mild aortic stenosis Jan 2019 mild AS: mean grade 12, AVA VTI 1.81  - no recent symptoms   4. HTN - compliant with meds  5. Hyperlipidemia Jan 2020 TC 131 TG 182 HDL 45 LDL 50 - he remains compliant with statin    6. Postop afib - history of postop afib, no PAF as some of his chart reports.   - no recent palpitations.    SH: retired Scientist, product/process development   Past Medical History:  Diagnosis Date  . Anginal pain ([place])    occ  . Aortic stenosis 06/16/2017   Mild, noted on ECHO  . Arthritis   . Atrial fibrillation ([place]) 02/2016   post op  . CAD (coronary artery disease)    a. Anterior STEMI 2005 c/b vfib arrest, PCI to LAD at [facility]. b. Stent thrombosis 2006 with DES within prior LAD stent at [facility] C. 09/2012: s/p balloon angioplasty to mLAD for severe stenosis in previously stented segment & DES to LCx; initial enz neg but ruled in for NSTEMI after post-cath vagal sx, felt d/t distal emboliz of thrombus during case.  . Chronic back pain   . Dependent edema    Mild, occ  . Elevated hemidiaphragm    a. Noted 09/2012 - instructed to f/u PCP.  Marland Kitchen GERD (gastroesophageal reflux disease)   . Heart murmur   . History of kidney stones   . Hyperlipidemia   . Hypertension   . Ischemic cardiomyopathy    a. Unclear prior EF but pt was told heart was weakened in past. b. EF normal 09/2012.  Marland Kitchen Myocardial infarction ([place])   . Retinal tear, right   . Shortness of breath  dyspnea    not currently  . Ventricular fibrillation ([place])    a. VF arrest 2005 in setting of STEMI.  . Wears glasses      Allergies  Allergen Reactions  . Altace [Ramipril] Shortness Of Breath and Other (See Comments)    cough  . Penicillins Swelling    Has patient had a PCN reaction causing immediate rash, facial/tongue/throat swelling, SOB or lightheadedness with hypotension:unsure Has patient had a PCN reaction causing severe rash involving mucus membranes or skin necrosis:unsure Has patient had a PCN reaction that required hospitalization:No Has patient had a PCN reaction occurring within the last 10 years:NO If all of the above answers are "NO", then may proceed with Cephalosporin use.  Childhood reaction       Current Outpatient Medications  Medication Sig Dispense Refill  . acetaminophen (TYLENOL) 500 MG tablet Take 500 mg by mouth every 6 (six) hours as needed (PAIN).     Marland Kitchen aspirin 81 MG tablet Take 1 tablet (81 mg total) by mouth daily.    . cetirizine (ZYRTEC) 10 MG tablet Take 10 mg by mouth daily as needed.     . clopidogrel (PLAVIX) 75 MG tablet TAKE 1 TABLET (75 MG TOTAL) BY MOUTH DAILY. 90 tablet 2  .  isosorbide mononitrate (IMDUR) 30 MG 24 hr tablet TAKE 1 TABLET EVERY DAY 90 tablet 3  . meclizine (ANTIVERT) 12.5 MG tablet Take 1 tablet (12.5 mg total) by mouth 3 (three) times daily as needed for dizziness. 30 tablet 0  . metoprolol succinate (TOPROL-XL) 25 MG 24 hr tablet TAKE 1/2 TABLET EVERY DAY 45 tablet 1  . rosuvastatin (CRESTOR) 20 MG tablet TAKE 1 TABLET ONE TIME DAILY  90 tablet 1  . TURMERIC PO Take 2 capsules by mouth daily.     No current facility-administered medications for this visit.      Past Surgical History:  Procedure Laterality Date  . BACK SURGERY    . CARDIAC CATHETERIZATION     stents x2  . CARDIAC CATHETERIZATION N/A 10/02/2014   Procedure: Left Heart Cath And Coronary Angiography;  Surgeon: Larey Dresser, MD;  Location: [facility]  INVASIVE CV LAB CUPID;  Service: Cardiovascular;  Laterality: N/A;  . CARDIAC CATHETERIZATION N/A 02/17/2016   Procedure: Left Heart Cath and Coronary Angiography;  Surgeon: Larey Dresser, MD;  Location: [place] CV LAB;  Service: Cardiovascular;  Laterality: N/A;  . CORONARY ARTERY BYPASS GRAFT N/A 03/01/2016   Procedure: CORONARY ARTERY BYPASS GRAFTING (CABG)x3 with endoscopic harvesting of right saphenous vein -LIMA to LAD -SVG to LEFT CIRCUMFLEX -RADIAL ARTERY to RAMUS INTERMEDIA;  Surgeon: Grace Isaac, MD;  Location: [place];  Service: Open Heart Surgery;  Laterality: N/A;  . CORONARY STENT PLACEMENT    . CYSTOSCOPY/URETEROSCOPY/HOLMIUM LASER/STENT PLACEMENT Bilateral 05/29/2018   Procedure: CYSTOSCOPY/RETROGRADE/URETEROSCOPY/HOLMIUM LASER/STENT PLACEMENT/ BASKET STONE EXTRACTION;  Surgeon: [name], MD;  Location: [facility];  Service: Urology;  Laterality: Bilateral;  . EYE EXAMINATION UNDER ANESTHESIA W/ RETINAL CRYOTHERAPY AND RETINAL LASER    . HAND SURGERY Left    hamick bone rem  . KNEE ARTHROSCOPY Right 10/24/2014   Procedure: ARTHROSCOPY RIGHT KNEE, Partial medial menisectomy and chondroplasty;  Surgeon: Melrose Nakayama, MD;  Location: [place];  Service: Orthopedics;  Laterality: Right;  . LEFT HEART CATH N/A 10/11/2012   Procedure: LEFT HEART CATH;  Surgeon: Sherren Mocha, MD;  Location: [facility] CATH LAB;  Service: Cardiovascular;  Laterality: N/A;  . LITHOTRIPSY    . RADIAL ARTERY HARVEST Left 03/01/2016   Procedure: LEFT RADIAL ARTERY HARVEST;  Surgeon: Grace Isaac, MD;  Location: [place];  Service: Open Heart Surgery;  Laterality: Left;  . SPLIT NIGHT STUDY  09/21/2015  . TEE WITHOUT CARDIOVERSION N/A 03/01/2016   Procedure: TRANSESOPHAGEAL ECHOCARDIOGRAM (TEE);  Surgeon: Grace Isaac, MD;  Location: [place];  Service: Open Heart Surgery;  Laterality: N/A;  . TONSILLECTOMY       Allergies  Allergen Reactions  . Altace [Ramipril] Shortness Of  Breath and Other (See Comments)    cough  . Penicillins Swelling    Has patient had a PCN reaction causing immediate rash, facial/tongue/throat swelling, SOB or lightheadedness with hypotension:unsure Has patient had a PCN reaction causing severe rash involving mucus membranes or skin necrosis:unsure Has patient had a PCN reaction that required hospitalization:No Has patient had a PCN reaction occurring within the last 10 years:NO If all of the above answers are "NO", then may proceed with Cephalosporin use.  Childhood reaction        Family History  Problem Relation Age of Onset  . Heart disease Mother   . Stroke Father   . Healthy Brother   . Colon cancer Neg Hx   . Esophageal cancer Neg Hx   .  Rectal cancer Neg Hx   . Stomach cancer Neg Hx      Social History Hayden Rivera reports that he quit smoking about 16 years ago. His smoking use included cigars and cigarettes. He has a 5.00 pack-year smoking history. He has never used smokeless tobacco. Hayden Rivera reports previous alcohol use.   Review of Systems CONSTITUTIONAL: No weight loss, fever, chills, weakness or fatigue.  HEENT: Eyes: No visual loss, blurred vision, double vision or yellow sclerae.No hearing loss, sneezing, congestion, runny nose or sore throat.  SKIN: No rash or itching.  CARDIOVASCULAR: per hpi RESPIRATORY: No shortness of breath, cough or sputum.  GASTROINTESTINAL: No anorexia, nausea, vomiting or diarrhea. No abdominal pain or blood.  GENITOURINARY: No burning on urination, no polyuria NEUROLOGICAL: No headache, dizziness, syncope, paralysis, ataxia, numbness or tingling in the extremities. No change in bowel or bladder control.  MUSCULOSKELETAL: No muscle, back pain, joint pain or stiffness.  LYMPHATICS: No enlarged nodes. No history of splenectomy.  PSYCHIATRIC: No history of depression or anxiety.  ENDOCRINOLOGIC: No reports of sweating, cold or heat intolerance. No polyuria or polydipsia.  Marland Kitchen    Physical Examination Vitals:   01/09/19 1516  BP: (!) 146/77  Pulse: (!) 56  SpO2: 96%   Filed Weights   01/09/19 1516  Weight: 253 lb (114.8 kg)    Gen: resting comfortably, no acute distress HEENT: no scleral icterus, pupils equal round and reactive, no palptable cervical adenopathy,  CV: RRR, 2/6 systolic murmur rusb no jvd Resp: Clear to auscultation bilaterally GI: abdomen is soft, non-tender, non-distended, normal bowel sounds, no hepatosplenomegaly MSK: extremities are warm, no edema.  Skin: warm, no rash Neuro:  no focal deficits Psych: appropriate affect   Diagnostic Studies  08/2017 nuclear stress  Nuclear stress EF: 48%. Apical and anteroseptal wall hypokinesis  There was no ST segment deviation noted during stress.  Defect 1: There is a large defect of severe severity present in the mid anteroseptal, mid inferoseptal, mid inferior, mid inferolateral, apical anterior, apical septal, apical inferior, apical lateral and apex location.  This is an intermediate risk study. Appears to be large prior infarct pattern as described above. No ischemia identified.  Jan 2019 echo Study Conclusions  - Left ventricle: The cavity size was normal. Wall thickness was increased in a pattern of mild LVH. There was mild focal basal hypertrophy of the septum. Systolic function was mildly reduced. The estimated ejection fraction was in the range of 45% to 50%. There is akinesis of the mid-apicalanteroseptal myocardium. Features are consistent with a pseudonormal left ventricular filling pattern, with concomitant abnormal relaxation and increased filling pressure (grade 2 diastolic dysfunction). - Aortic valve: Valve mobility was restricted. There was mild stenosis. There was trivial regurgitation. - Ascending aorta: The ascending aorta was mildly dilated. - Pulmonary arteries: Systolic pressure was mildly increased. PA peak pressure: 36 mm Hg (S).   Impressions:  - Akinesis of the distal septum/apex; overall mildly reduced LV systolic function; moderate diastolic dysfunction; calcified aortic valve with fixed noncoronary cusp; mild AS (mean gradient 12 mmHg) and trace AI; milldy dilated ascending aorta; mild TR with mildly elevated pulmonary pressure.   Assessment and Plan  1. CAD/ ICM - committed to indefinite DAPT due to long history of significant disease and interventions - no recent symptoms, continue current meds  2. Aortic stenosis - mild by echo, continue to monitor. LIkely repeat echo 2-3 years  3. HTN - manual recheck 130/75, at goal. Continue current meds  4.  Hyperlipidemia - he is at goal, continue statin     F/u 6 months   Arnoldo Lenis, M.D.

## 2019-01-09 NOTE — Patient Instructions (Addendum)
Medication Instructions:   Refills sent to [facility] today on your: Plavix Imdur Toprol  Crestor  Continue all other medications.     Labwork: none  Testing/Procedures: none  Follow-Up: Your physician wants you to follow up in: 6 months.  You will receive a reminder letter in the mail one-two months in advance.  If you don't receive a letter, please call our office to schedule the follow up appointment   Any Other Special Instructions Will Be Listed Below (If Applicable).  If you need a refill on your cardiac medications before your next appointment, please call your pharmacy.

## 2019-06-28 DIAGNOSIS — R310 Gross hematuria: Secondary | ICD-10-CM | POA: Diagnosis not present

## 2019-06-28 DIAGNOSIS — N200 Calculus of kidney: Secondary | ICD-10-CM | POA: Diagnosis not present

## 2019-07-23 DIAGNOSIS — R3121 Asymptomatic microscopic hematuria: Secondary | ICD-10-CM | POA: Diagnosis not present

## 2019-07-23 DIAGNOSIS — N200 Calculus of kidney: Secondary | ICD-10-CM | POA: Diagnosis not present

## 2019-07-23 DIAGNOSIS — R3915 Urgency of urination: Secondary | ICD-10-CM | POA: Diagnosis not present

## 2019-09-13 ENCOUNTER — Ambulatory Visit: Payer: Medicare HMO | Admitting: Cardiology

## 2019-10-02 ENCOUNTER — Other Ambulatory Visit: Payer: Self-pay | Admitting: Cardiology

## 2019-10-14 DIAGNOSIS — R310 Gross hematuria: Secondary | ICD-10-CM | POA: Diagnosis not present

## 2019-10-14 DIAGNOSIS — N200 Calculus of kidney: Secondary | ICD-10-CM | POA: Diagnosis not present

## 2019-10-21 ENCOUNTER — Encounter: Payer: Self-pay | Admitting: *Deleted

## 2019-10-21 ENCOUNTER — Ambulatory Visit (INDEPENDENT_AMBULATORY_CARE_PROVIDER_SITE_OTHER): Payer: Medicare HMO | Admitting: Cardiology

## 2019-10-21 ENCOUNTER — Encounter: Payer: Self-pay | Admitting: Cardiology

## 2019-10-21 ENCOUNTER — Other Ambulatory Visit: Payer: Self-pay

## 2019-10-21 VITALS — BP 122/74 | HR 74 | Ht 71.0 in | Wt 224.0 lb

## 2019-10-21 DIAGNOSIS — I2510 Atherosclerotic heart disease of native coronary artery without angina pectoris: Secondary | ICD-10-CM

## 2019-10-21 DIAGNOSIS — E782 Mixed hyperlipidemia: Secondary | ICD-10-CM | POA: Diagnosis not present

## 2019-10-21 DIAGNOSIS — I350 Nonrheumatic aortic (valve) stenosis: Secondary | ICD-10-CM | POA: Diagnosis not present

## 2019-10-21 DIAGNOSIS — I10 Essential (primary) hypertension: Secondary | ICD-10-CM | POA: Diagnosis not present

## 2019-10-21 NOTE — Patient Instructions (Signed)

## 2019-10-21 NOTE — Progress Notes (Signed)
Clinical Summary Mr. Torain is a 69 y.o.male seen today for follow up of the following medical problems.   1. CAD - multiple PCIs, CABG in 02/2016 08/2017 nuclear stress no ischemia, large prior infarct - committed to indefinite DAPT due to long history of significant disease and interventions   - no recent chest pain. No SOB/DOE - compliant with meds   2. ICM - Jan 2019 echo LVEF 45-50%, akinesis mid-apicalanteroseptal wall. Grade II diastolic dysfunction  - no SOB/DOE, no LE edema   3. Mild aortic stenosis Jan 2019 mild AS: mean grade 12, AVA VTI 1.81  - no recent symptoms   4. HTN - compliant with meds -cough on ramipril.   5. Hyperlipidemia Jan 2020 TC 131 TG 182 HDL 45 LDL 50 - he reports last labs with [facility] - compliant with statin      SH: retired Scientist, product/process development   Past Medical History:  Diagnosis Date  . Anginal pain ([place])    occ  . Aortic stenosis 06/16/2017   Mild, noted on ECHO  . Arthritis   . Atrial fibrillation ([place]) 02/2016   post op  . CAD (coronary artery disease)    a. Anterior STEMI 2005 c/b vfib arrest, PCI to LAD at [facility]. b. Stent thrombosis 2006 with DES within prior LAD stent at [facility]. C. 09/2012: s/p balloon angioplasty to mLAD for severe stenosis in previously stented segment & DES to LCx; initial enz neg but ruled in for NSTEMI after post-cath vagal sx, felt d/t distal emboliz of thrombus during case.  . Chronic back pain   . Dependent edema    Mild, occ  . Elevated hemidiaphragm    a. Noted 09/2012 - instructed to f/u PCP.  Marland Kitchen GERD (gastroesophageal reflux disease)   . Heart murmur   . History of kidney stones   . Hyperlipidemia   . Hypertension   . Ischemic cardiomyopathy    a. Unclear prior EF but pt was told heart was weakened in past. b. EF normal 09/2012.  Marland Kitchen Myocardial infarction ([place])   . Retinal tear, right   . Shortness of breath dyspnea    not currently  . Ventricular fibrillation ([place])     a. VF arrest 2005 in setting of STEMI.  . Wears glasses      Allergies  Allergen Reactions  . Altace [Ramipril] Shortness Of Breath and Other (See Comments)    cough  . Penicillins Swelling    Has patient had a PCN reaction causing immediate rash, facial/tongue/throat swelling, SOB or lightheadedness with hypotension:unsure Has patient had a PCN reaction causing severe rash involving mucus membranes or skin necrosis:unsure Has patient had a PCN reaction that required hospitalization:No Has patient had a PCN reaction occurring within the last 10 years:NO If all of the above answers are "NO", then may proceed with Cephalosporin use.  Childhood reaction       Current Outpatient Medications  Medication Sig Dispense Refill  . acetaminophen (TYLENOL) 500 MG tablet Take 500 mg by mouth every 6 (six) hours as needed (PAIN).     Marland Kitchen aspirin 81 MG tablet Take 1 tablet (81 mg total) by mouth daily.    . cetirizine (ZYRTEC) 10 MG tablet Take 10 mg by mouth daily as needed.     . clopidogrel (PLAVIX) 75 MG tablet Take 1 tablet (75 mg total) by mouth daily. 90 tablet 3  . isosorbide mononitrate (IMDUR) 30 MG 24 hr tablet TAKE 1 TABLET EVERY DAY  90 tablet 1  . meclizine (ANTIVERT) 12.5 MG tablet Take 1 tablet (12.5 mg total) by mouth 3 (three) times daily as needed for dizziness. 30 tablet 0  . metoprolol succinate (TOPROL-XL) 25 MG 24 hr tablet Take 0.5 tablets (12.5 mg total) by mouth daily. 45 tablet 3  . rosuvastatin (CRESTOR) 20 MG tablet Take 1 tablet (20 mg total) by mouth daily. 90 tablet 3  . TURMERIC PO Take 2 capsules by mouth daily.     No current facility-administered medications for this visit.     Past Surgical History:  Procedure Laterality Date  . BACK SURGERY    . CARDIAC CATHETERIZATION     stents x2  . CARDIAC CATHETERIZATION N/A 10/02/2014   Procedure: Left Heart Cath And Coronary Angiography;  Surgeon: Larey Dresser, MD;  Location: [facility] INVASIVE CV LAB CUPID;   Service: Cardiovascular;  Laterality: N/A;  . CARDIAC CATHETERIZATION N/A 02/17/2016   Procedure: Left Heart Cath and Coronary Angiography;  Surgeon: Larey Dresser, MD;  Location: [place] CV LAB;  Service: Cardiovascular;  Laterality: N/A;  . CORONARY ARTERY BYPASS GRAFT N/A 03/01/2016   Procedure: CORONARY ARTERY BYPASS GRAFTING (CABG)x3 with endoscopic harvesting of right saphenous vein -LIMA to LAD -SVG to LEFT CIRCUMFLEX -RADIAL ARTERY to RAMUS INTERMEDIA;  Surgeon: Grace Isaac, MD;  Location: [place];  Service: Open Heart Surgery;  Laterality: N/A;  . CORONARY STENT PLACEMENT    . CYSTOSCOPY/URETEROSCOPY/HOLMIUM LASER/STENT PLACEMENT Bilateral 05/29/2018   Procedure: CYSTOSCOPY/RETROGRADE/URETEROSCOPY/HOLMIUM LASER/STENT PLACEMENT/ BASKET STONE EXTRACTION;  Surgeon: [name], MD;  Location: [facility];  Service: Urology;  Laterality: Bilateral;  . EYE EXAMINATION UNDER ANESTHESIA W/ RETINAL CRYOTHERAPY AND RETINAL LASER    . HAND SURGERY Left    hamick bone rem  . KNEE ARTHROSCOPY Right 10/24/2014   Procedure: ARTHROSCOPY RIGHT KNEE, Partial medial menisectomy and chondroplasty;  Surgeon: Melrose Nakayama, MD;  Location: [place];  Service: Orthopedics;  Laterality: Right;  . LEFT HEART CATH N/A 10/11/2012   Procedure: LEFT HEART CATH;  Surgeon: Sherren Mocha, MD;  Location: [facility] CATH LAB;  Service: Cardiovascular;  Laterality: N/A;  . LITHOTRIPSY    . RADIAL ARTERY HARVEST Left 03/01/2016   Procedure: LEFT RADIAL ARTERY HARVEST;  Surgeon: Grace Isaac, MD;  Location: [place];  Service: Open Heart Surgery;  Laterality: Left;  . SPLIT NIGHT STUDY  09/21/2015  . TEE WITHOUT CARDIOVERSION N/A 03/01/2016   Procedure: TRANSESOPHAGEAL ECHOCARDIOGRAM (TEE);  Surgeon: Grace Isaac, MD;  Location: [place];  Service: Open Heart Surgery;  Laterality: N/A;  . TONSILLECTOMY       Allergies  Allergen Reactions  . Altace [Ramipril] Shortness Of Breath and Other (See  Comments)    cough  . Penicillins Swelling    Has patient had a PCN reaction causing immediate rash, facial/tongue/throat swelling, SOB or lightheadedness with hypotension:unsure Has patient had a PCN reaction causing severe rash involving mucus membranes or skin necrosis:unsure Has patient had a PCN reaction that required hospitalization:No Has patient had a PCN reaction occurring within the last 10 years:NO If all of the above answers are "NO", then may proceed with Cephalosporin use.  Childhood reaction        Family History  Problem Relation Age of Onset  . Heart disease Mother   . Stroke Father   . Healthy Brother   . Colon cancer Neg Hx   . Esophageal cancer Neg Hx   . Rectal cancer Neg Hx   . Stomach  cancer Neg Hx      Social History Mr. Ruberg reports that he quit smoking about 17 years ago. His smoking use included cigars and cigarettes. He has a 5.00 pack-year smoking history. He has never used smokeless tobacco. Mr. Brookens reports previous alcohol use.   Review of Systems CONSTITUTIONAL: No weight loss, fever, chills, weakness or fatigue.  HEENT: Eyes: No visual loss, blurred vision, double vision or yellow sclerae.No hearing loss, sneezing, congestion, runny nose or sore throat.  SKIN: No rash or itching.  CARDIOVASCULAR: per hpi RESPIRATORY: No shortness of breath, cough or sputum.  GASTROINTESTINAL: No anorexia, nausea, vomiting or diarrhea. No abdominal pain or blood.  GENITOURINARY: No burning on urination, no polyuria NEUROLOGICAL: No headache, dizziness, syncope, paralysis, ataxia, numbness or tingling in the extremities. No change in bowel or bladder control.  MUSCULOSKELETAL: No muscle, back pain, joint pain or stiffness.  LYMPHATICS: No enlarged nodes. No history of splenectomy.  PSYCHIATRIC: No history of depression or anxiety.  ENDOCRINOLOGIC: No reports of sweating, cold or heat intolerance. No polyuria or polydipsia.  Marland Kitchen   Physical  Examination Today's Vitals   10/21/19 1439  BP: 122/74  Pulse: 74  SpO2: 97%  Weight: 224 lb (101.6 kg)  Height: 5\' 11"  (1.803 m)   Body mass index is 31.24 kg/m.  Gen: resting comfortably, no acute distress HEENT: no scleral icterus, pupils equal round and reactive, no palptable cervical adenopathy,  CV: [id number] systolic murmur rusb Resp: Clear to auscultation bilaterally GI: abdomen is soft, non-tender, non-distended, normal bowel sounds, no hepatosplenomegaly MSK: extremities are warm, no edema.  Skin: warm, no rash Neuro:  no focal deficits Psych: appropriate affect   Diagnostic Studies 08/2017 nuclear stress  Nuclear stress EF: 48%. Apical and anteroseptal wall hypokinesis  There was no ST segment deviation noted during stress.  Defect 1: There is a large defect of severe severity present in the mid anteroseptal, mid inferoseptal, mid inferior, mid inferolateral, apical anterior, apical septal, apical inferior, apical lateral and apex location.  This is an intermediate risk study. Appears to be large prior infarct pattern as described above. No ischemia identified.  Jan 2019 echo Study Conclusions  - Left ventricle: The cavity size was normal. Wall thickness was increased in a pattern of mild LVH. There was mild focal basal hypertrophy of the septum. Systolic function was mildly reduced. The estimated ejection fraction was in the range of 45% to 50%. There is akinesis of the mid-apicalanteroseptal myocardium. Features are consistent with a pseudonormal left ventricular filling pattern, with concomitant abnormal relaxation and increased filling pressure (grade 2 diastolic dysfunction). - Aortic valve: Valve mobility was restricted. There was mild stenosis. There was trivial regurgitation. - Ascending aorta: The ascending aorta was mildly dilated. - Pulmonary arteries: Systolic pressure was mildly increased. PA peak pressure: 36 mm Hg  (S).  Impressions:  - Akinesis of the distal septum/apex; overall mildly reduced LV systolic function; moderate diastolic dysfunction; calcified aortic valve with fixed noncoronary cusp; mild AS (mean gradient 12 mmHg) and trace AI; milldy dilated ascending aorta; mild TR with mildly elevated pulmonary pressure.    Assessment and Plan  1. CAD/ ICM - committed to indefinite DAPT due to long history of significant disease and interventions - no symptoms, continue current meds EKG SR, no acute ischemic changes  2. Aortic stenosis - mild by echo - no symptoms - likely repeat echo 2022  3. HTN - at goal, continue current meds  4. Hyperlipidemia -request labs from [place] -  continue statin        Arnoldo Lenis, M.D.

## 2019-10-30 ENCOUNTER — Other Ambulatory Visit: Payer: Self-pay | Admitting: Cardiology

## 2019-10-30 DIAGNOSIS — Z951 Presence of aortocoronary bypass graft: Secondary | ICD-10-CM

## 2019-10-30 DIAGNOSIS — I480 Paroxysmal atrial fibrillation: Secondary | ICD-10-CM

## 2019-10-30 DIAGNOSIS — I10 Essential (primary) hypertension: Secondary | ICD-10-CM

## 2019-12-02 ENCOUNTER — Other Ambulatory Visit: Payer: Self-pay | Admitting: Cardiology

## 2019-12-24 DIAGNOSIS — N200 Calculus of kidney: Secondary | ICD-10-CM | POA: Diagnosis not present

## 2019-12-24 DIAGNOSIS — R310 Gross hematuria: Secondary | ICD-10-CM | POA: Diagnosis not present

## 2020-02-17 ENCOUNTER — Other Ambulatory Visit: Payer: Self-pay | Admitting: Cardiology

## 2020-02-27 ENCOUNTER — Ambulatory Visit (INDEPENDENT_AMBULATORY_CARE_PROVIDER_SITE_OTHER): Payer: Medicare HMO | Admitting: Nurse Practitioner

## 2020-02-27 DIAGNOSIS — K591 Functional diarrhea: Secondary | ICD-10-CM | POA: Diagnosis not present

## 2020-02-27 NOTE — Progress Notes (Signed)
   Virtual Visit via telephone Note Due to COVID-19 pandemic this visit was conducted virtually. This visit type was conducted due to national recommendations for restrictions regarding the COVID-19 Pandemic (e.g. social distancing, sheltering in place) in an effort to limit this patient's exposure and mitigate transmission in our community. All issues noted in this document were discussed and addressed.  A physical exam was not performed with this format.  I connected with Hayden Rivera on 02/27/20 at 11:30 by telephone and verified that I am speaking with the correct person using two identifiers. SPURGEON GANCARZ is currently located at home and his wife is currently with him during visit. The provider, Mary-Margaret Hassell Done, FNP is located in their office at time of visit.  I discussed the limitations, risks, security and privacy concerns of performing an evaluation and management service by telephone and the availability of in person appointments. I also discussed with the patient that there may be a patient responsible charge related to this service. The patient expressed understanding and agreed to proceed.   History and Present Illness:   Chief Complaint: diarrhea  HPI Patient calls instating that he developed diarrhea Saturday. He is  having some abdominal pain, only when he needs to go to restroom. He says that if he pushes on his left lower abdomen it is uncomfortable. , but now says has a hard time going , but stool is still runny. Has has not done anything other then take tums. He denies eating anything that could have caused this. His wife and him have ate the same things and she does not have it.  Rates pain 5/10.  No appetite changes, no fever , nausea or vomiting.    Review of Systems  Constitutional: Negative for chills and fever.  Respiratory: Negative.   Cardiovascular: Negative.   Gastrointestinal: Positive for abdominal pain (slight cramping) and diarrhea. Negative for blood  in stool, constipation, heartburn, nausea and vomiting.  Genitourinary: Negative.   Neurological: Negative.   Psychiatric/Behavioral: Negative.   All other systems reviewed and are negative.    Observations/Objective: Alert and oriented- answers all questions appropriately No distress    Assessment and Plan: Hayden Rivera in today with chief complaint of No chief complaint on file.   1. Functional diarrhea Try immodium AD OTC today If no better come in for face to face visit at 1045 tomorrow.      Follow Up Instructions: prn    I discussed the assessment and treatment plan with the patient. The patient was provided an opportunity to ask questions and all were answered. The patient agreed with the plan and demonstrated an understanding of the instructions.   The patient was advised to call back or seek an in-person evaluation if the symptoms worsen or if the condition fails to improve as anticipated.  The above assessment and management plan was discussed with the patient. The patient verbalized understanding of and has agreed to the management plan. Patient is aware to call the clinic if symptoms persist or worsen. Patient is aware when to return to the clinic for a follow-up visit. Patient educated on when it is appropriate to go to the emergency department.   Time call ended:  11:45  I provided 15 minutes of non-face-to-face time during this encounter.    Mary-Margaret Hassell Done, FNP

## 2020-02-28 ENCOUNTER — Other Ambulatory Visit: Payer: Self-pay

## 2020-02-28 ENCOUNTER — Ambulatory Visit (INDEPENDENT_AMBULATORY_CARE_PROVIDER_SITE_OTHER): Payer: Medicare HMO | Admitting: Nurse Practitioner

## 2020-02-28 ENCOUNTER — Ambulatory Visit (INDEPENDENT_AMBULATORY_CARE_PROVIDER_SITE_OTHER): Payer: Medicare HMO

## 2020-02-28 ENCOUNTER — Encounter: Payer: Self-pay | Admitting: Nurse Practitioner

## 2020-02-28 VITALS — BP 129/78 | HR 58 | Temp 97.6°F | Ht 71.0 in | Wt 214.4 lb

## 2020-02-28 DIAGNOSIS — K591 Functional diarrhea: Secondary | ICD-10-CM

## 2020-02-28 DIAGNOSIS — R109 Unspecified abdominal pain: Secondary | ICD-10-CM | POA: Diagnosis not present

## 2020-02-28 NOTE — Patient Instructions (Signed)

## 2020-02-28 NOTE — Progress Notes (Signed)
   Subjective:    Patient ID: Hayden Rivera, male    DOB: 1951/01/06, 69 y.o.   MRN: [id number]   Chief Complaint: Diarrhea   HPI Patient comes in today c/o diarrhea for several days. He had telephone visit yesterday and was told to come im today if no better. He took imodium yesterday and feels a little better this morning. He has had  No other symptoms other then diarrhea. Rates pain 3-4/10 currently.  Review of Systems  Gastrointestinal: Positive for abdominal pain and diarrhea. Negative for constipation, nausea and vomiting.  All other systems reviewed and are negative.      Objective:   Physical Exam Vitals and nursing note reviewed.  Constitutional:      Appearance: Normal appearance.  Cardiovascular:     Rate and Rhythm: Normal rate and regular rhythm.     Heart sounds: Normal heart sounds.  Pulmonary:     Breath sounds: Normal breath sounds.  Abdominal:     General: Abdomen is flat.     Palpations: Abdomen is soft.     Tenderness: There is abdominal tenderness (left sided).     Comments: Hypoactive bowel sounds  Skin:    General: Skin is warm and dry.  Neurological:     General: No focal deficit present.     Mental Status: He is alert and oriented to person, place, and time.  Psychiatric:        Mood and Affect: Mood normal.        Behavior: Behavior normal.    BP 129/78   Pulse (!) 58   Temp 97.6 F (36.4 C) (Temporal)   Ht $R'5\' 11"'VF$  (1.803 m)   Wt 214 lb 6.4 oz (97.3 kg)   BMI 29.90 kg/m    kub- normal-Preliminary reading by Ronnald Collum, FNP  [facility]      Assessment & Plan:  Hayden Rivera in today with chief complaint of Diarrhea   1. Functional diarrhea Continue imodium AD OTC Abs pending Force fluids - DG Abd 1 View - Cdiff NAA+O+P+Stool Culture - CBC with Differential/Platelet - CMP14+EGFR    The above assessment and management plan was discussed with the patient. The patient verbalized understanding of and has agreed to the management  plan. Patient is aware to call the clinic if symptoms persist or worsen. Patient is aware when to return to the clinic for a follow-up visit. Patient educated on when it is appropriate to go to the emergency department.   Mary-Margaret Hassell Done, FNP

## 2020-02-29 LAB — CBC WITH DIFFERENTIAL/PLATELET
Basophils Absolute: 0.1 10*3/uL (ref 0.0–0.2)
Basos: 1 %
EOS (ABSOLUTE): 0.2 10*3/uL (ref 0.0–0.4)
Eos: 3 %
Hematocrit: 42.2 % (ref 37.5–51.0)
Hemoglobin: 14.5 g/dL (ref 13.0–17.7)
Immature Grans (Abs): 0 10*3/uL (ref 0.0–0.1)
Immature Granulocytes: 0 %
Lymphocytes Absolute: 2.5 10*3/uL (ref 0.7–3.1)
Lymphs: 27 %
MCH: 33.1 pg — ABNORMAL HIGH (ref 26.6–33.0)
MCHC: 34.4 g/dL (ref 31.5–35.7)
MCV: 96 fL (ref 79–97)
Monocytes Absolute: 0.9 10*3/uL (ref 0.1–0.9)
Monocytes: 9 %
Neutrophils Absolute: 5.5 10*3/uL (ref 1.4–7.0)
Neutrophils: 60 %
Platelets: 137 10*3/uL — ABNORMAL LOW (ref 150–450)
RBC: 4.38 x10E6/uL (ref 4.14–5.80)
RDW: 12.4 % (ref 11.6–15.4)
WBC: 9.1 10*3/uL (ref 3.4–10.8)

## 2020-02-29 LAB — CMP14+EGFR
ALT: 21 IU/L (ref 0–44)
AST: 22 IU/L (ref 0–40)
Albumin/Globulin Ratio: 2 (ref 1.2–2.2)
Albumin: 4.8 g/dL (ref 3.8–4.8)
Alkaline Phosphatase: 92 IU/L (ref 44–121)
BUN/Creatinine Ratio: 13 (ref 10–24)
BUN: 12 mg/dL (ref 8–27)
Bilirubin Total: 0.9 mg/dL (ref 0.0–1.2)
CO2: 22 mmol/L (ref 20–29)
Calcium: 9.8 mg/dL (ref 8.6–10.2)
Chloride: 107 mmol/L — ABNORMAL HIGH (ref 96–106)
Creatinine, Ser: 0.9 mg/dL (ref 0.76–1.27)
GFR calc Af Amer: 100 mL/min/{1.73_m2} (ref >59)
GFR calc non Af Amer: 87 mL/min/{1.73_m2} (ref >59)
Globulin, Total: 2.4 g/dL (ref 1.5–4.5)
Glucose: 82 mg/dL (ref 65–99)
Potassium: 4.2 mmol/L (ref 3.5–5.2)
Sodium: 144 mmol/L (ref 134–144)
Total Protein: 7.2 g/dL (ref 6.0–8.5)

## 2020-03-02 ENCOUNTER — Other Ambulatory Visit: Payer: Medicare HMO

## 2020-03-02 ENCOUNTER — Other Ambulatory Visit: Payer: Self-pay

## 2020-03-02 DIAGNOSIS — K591 Functional diarrhea: Secondary | ICD-10-CM | POA: Diagnosis not present

## 2020-03-06 LAB — CDIFF NAA+O+P+STOOL CULTURE
E coli, Shiga toxin Assay: NEGATIVE
Toxigenic C. Difficile by PCR: NEGATIVE

## 2020-03-06 MED ORDER — CIPROFLOXACIN HCL 500 MG PO TABS: 500.0000 mg | ORAL_TABLET | Freq: Two times a day (BID) | ORAL | 0 refills | Status: DC

## 2020-03-06 NOTE — Addendum Note (Signed)
Addended by: Chevis Pretty on: 03/06/2020 08:34 AM   Modules accepted: Orders

## 2020-03-25 ENCOUNTER — Other Ambulatory Visit: Payer: Self-pay | Admitting: Urology

## 2020-03-25 ENCOUNTER — Other Ambulatory Visit: Payer: Self-pay

## 2020-03-25 ENCOUNTER — Encounter (HOSPITAL_BASED_OUTPATIENT_CLINIC_OR_DEPARTMENT_OTHER): Payer: Self-pay | Admitting: Urology

## 2020-03-25 DIAGNOSIS — K529 Noninfective gastroenteritis and colitis, unspecified: Secondary | ICD-10-CM | POA: Diagnosis not present

## 2020-03-25 DIAGNOSIS — R8271 Bacteriuria: Secondary | ICD-10-CM | POA: Diagnosis not present

## 2020-03-25 DIAGNOSIS — R310 Gross hematuria: Secondary | ICD-10-CM | POA: Diagnosis not present

## 2020-03-25 DIAGNOSIS — N200 Calculus of kidney: Secondary | ICD-10-CM | POA: Diagnosis not present

## 2020-03-25 DIAGNOSIS — Z20822 Contact with and (suspected) exposure to covid-19: Secondary | ICD-10-CM | POA: Diagnosis not present

## 2020-03-25 DIAGNOSIS — Z5329 Procedure and treatment not carried out because of patient's decision for other reasons: Secondary | ICD-10-CM | POA: Diagnosis not present

## 2020-03-25 DIAGNOSIS — N202 Calculus of kidney with calculus of ureter: Secondary | ICD-10-CM | POA: Diagnosis not present

## 2020-03-25 DIAGNOSIS — K5790 Diverticulosis of intestine, part unspecified, without perforation or abscess without bleeding: Secondary | ICD-10-CM | POA: Diagnosis not present

## 2020-03-25 DIAGNOSIS — N201 Calculus of ureter: Secondary | ICD-10-CM | POA: Diagnosis not present

## 2020-03-25 DIAGNOSIS — R1111 Vomiting without nausea: Secondary | ICD-10-CM | POA: Diagnosis not present

## 2020-03-25 NOTE — Progress Notes (Signed)
Spoke w/ via phone for pre-op interview---PT Lab needs dos---- I STAT 8              COVID test ------03-25-2020 Arrive at -------900 AM 03-26-2020 NPO after MN NO Solid Food.  Clear liquids from MN until---800 THEN NPO Medications to take morning of surgery -----IMDUR, ZYRTEC, IMMODIUM PRN Diabetic medication -----NONE DAY OF SURGERY Patient Special Instructions -----NONE Pre-Op special Istructions -----NONE Patient verbalized understanding of instructions that were given at this phone interview. Patient denies shortness of breath, chest pain, fever, cough at this phone interview.  Anesthesia Review: NO   PCP: Chevis Pretty FNP Cardiologist :DR Alferd Apa 10-21-2019 EPIC Chest x-ray :NONE EKG : 10-21-2019 EPIC Echo :06-16-2017 EPIC Stress test: NONE Cardiac Cath : 02-17-2016 EPIC Activity level: CAN CLIMB STAIRS WITHOUT DIFFICULTY Sleep Study/ CPAP :NONE SLEEP STUDY 09-29-2015 EPIC Fasting Blood Sugar :  104-108    / Checks Blood Sugar  Q WEEK  Blood Thinner/ Instructions /Last Dose:STAY ON PLAVIX PER DR ESKRIDGE  INSTRUCTIONS LAST DOSE PM 03-25-2020  ASA / Instructions/ Last Dose : STAY ON 81 MG ASPIRIN PER DR ESKRIDGE LAST DOSE 03-25-2020 800 AM  DR ESKRIDGE CALLED IN ANTIBIOTIC PT HAS NOT CALLED IN YET, PT TO BRING ANTIBIOTIC NAME TOMORROW

## 2020-03-25 NOTE — H&P (Signed)
Office Visit Report     03/25/2020   --------------------------------------------------------------------------------   Hayden Rivera  MRN: [id number]  DOB: February 11, 1951, 69 year old Male  SSN: [id number]   PRIMARY CARE:  Chipper Herb, MD  REFERRING:  Stevan Born, NP  PROVIDER:  [name], M.D.  LOCATION:  [facility] [phone]     --------------------------------------------------------------------------------   CC/HPI: F/u -   1) kidney stones - KUB with stable RLP and LUP / LMP stones tx 12/19 with bilateral ureteroscopy with laser lithotripsy, he pulled stents. F/u US benign. CT 02/21 revealed bilateral stones (6 mm RUP, 5 mm left renal pelvic stone). [place] 07/21 no hydro. KUB done 02/29/2020 for abd pain with possible 9 mm right distal stone. 5 mm left pelvis.   2) hematuria - eval in 2018 with cysto and [place]. He had gross hematuria Jan 2021. F/u KUB with faint 5 mm bilateral stones. Ua with greater than 60 rbcs. He had some left flank pain but hasn't seen a stone pass. He noted red urine especially p yard work/mowing. He noted "quite a bit of blood" the other day when he voided. [place] 07/21 and cystoscopy benign. No hydro. Small stones, cysts.   Today, pt is seen for the above. He has a weak stream. Passing some blood. He has been having diarrhea and pain from left side and also on right. He tried Cipro. He hasn't had dysuria. No fever or chills. He tried tamsulosin. No stone passage. UA today with moderate bacteria. His temp is 100.4.     ALLERGIES: Penicillins    MEDICATIONS: Crestor  Metformin Hcl  Metoprolol Tartrate 25 mg tablet  Zyrtec  Aspirin Ec 81 mg tablet, delayed release Oral  Clopidogrel 75 mg tablet Oral  Isosorbide  Losartan Potassium 50 mg tablet Oral  Vitamin D-3 TABS Oral     GU PSH: Cystoscopy - 12/24/2019, 2018 Ureteroscopic laser litho, Bilateral - 05/29/2018       PSH Notes: Kidney Surgery, Heart Surgery, Back Surgery    NON-GU PSH: Coronary Artery Bypass Grafting Eye Surgery (Unspecified)     GU PMH: Renal calculus - 12/24/2019, Will follow-up with a renal US. Start tamsulosin. , - 10/14/2019, Patient has bilateral nonobstructing calculi with a 7 x 9.7 mm calculus identified in the left renal pelvis. No obvious ureteral calculi noted on today's exam with final radiological interpretation pending. Renal pelvis stone not well seen on last KUB or scout imaging today. I have high suspicion this is the source of his continued microscopic hematuria., - 07/23/2019, - 06/28/2019, - 2020, - 2020, - 05/03/2018, - 2019, - 2019, - 2018, - 2018, Nephrolithiasis, - 2016 Gross hematuria (Stable), will check cystoscopy to complete eval. - 10/14/2019, - 05/03/2018 ED due to arterial insufficiency - 2019, - 2018, - 2018, Erectile dysfunction due to arterial insufficiency, - 2016 Microscopic hematuria - 2018 Other microscopic hematuria, Microscopic hematuria - 2016 Urinary Urgency, Urinary urgency - 2016    NON-GU PMH: Encounter for general adult medical examination without abnormal findings, Encounter for preventive health examination - 2016 Personal history of other diseases of the circulatory system, History of congestive heart disease - 2016, History of heart failure, - 2016 Personal history of other diseases of the musculoskeletal system and connective tissue, History of arthritis - 2016 Diabetes Type 2    FAMILY HISTORY: cardiac disorder - Runs In Family Hematuria - Runs In Family Hypertension - Runs In Family   SOCIAL HISTORY: Marital Status: Married Preferred Language:  English; Race: White Current Smoking Status: Patient does not smoke anymore.   Tobacco Use Assessment Completed: Used Tobacco in last 30 days? Drinks 1 caffeinated drink per day.     Notes: No alcohol use, Occupation, Caffeine use, Married, Number of children, Former smoker   REVIEW OF SYSTEMS:    GU Review Male:   Patient denies frequent  urination, hard to postpone urination, burning/ pain with urination, get up at night to urinate, leakage of urine, stream starts and stops, trouble starting your stream, have to strain to urinate , erection problems, and penile pain.  Gastrointestinal (Upper):   Patient denies nausea, vomiting, and indigestion/ heartburn.  Gastrointestinal (Lower):   Patient denies diarrhea and constipation.  Constitutional:   Patient denies fever, night sweats, weight loss, and fatigue.  Skin:   Patient denies skin rash/ lesion and itching.  Eyes:   Patient denies blurred vision and double vision.  Ears/ Nose/ Throat:   Patient denies sore throat and sinus problems.  Hematologic/Lymphatic:   Patient denies swollen glands and easy bruising.  Cardiovascular:   Patient denies leg swelling and chest pains.  Respiratory:   Patient denies cough and shortness of breath.  Endocrine:   Patient denies excessive thirst.  Musculoskeletal:   Patient denies back pain and joint pain.  Neurological:   Patient denies headaches and dizziness.  Psychologic:   Patient denies depression and anxiety.   Notes: hematuria noted weak stream gastric issues, stomach pain was on cipro,helped but symptoms returned    VITAL SIGNS:      03/25/2020 02:34 PM  Weight 215 lb / 97.52 kg  Height 71 in / 180.34 cm  BP 121/68 mmHg  Heart Rate 71 /min  Temperature 100.4 F / 38 C  BMI 30.0 kg/m   MULTI-SYSTEM PHYSICAL EXAMINATION:    Constitutional: Well-nourished. No physical deformities. Normally developed. Good grooming.  Neck: Neck symmetrical, not swollen. Normal tracheal position.  Respiratory: No labored breathing, no use of accessory muscles.   Cardiovascular: Normal temperature, normal extremity pulses, no swelling, no varicosities.  Skin: No paleness, no jaundice, no cyanosis. No lesion, no ulcer, no rash.  Neurologic / Psychiatric: Oriented to time, oriented to place, oriented to person. No depression, no anxiety, no agitation.   Gastrointestinal: No mass, no tenderness, no rigidity, non obese abdomen.     PAST DATA REVIEW: None   PROCEDURES:         C.T. Urogram - [id number]      Patient confirmed No Neulasta OnPro Device.         Urinalysis w/Scope Dipstick Dipstick Cont'd Micro  Color: Brown Bilirubin: Invalid mg/dL WBC/hpf: 10 - 20/hpf  Appearance: Cloudy Ketones: Invalid mg/dL RBC/hpf: >60/hpf  Specific Gravity: Invalid Blood: Invalid ery/uL Bacteria: Mod (26-50/hpf)  pH: Invalid Protein: Invalid mg/dL Cystals: NS (Not Seen)  Glucose: Invalid mg/dL Urobilinogen: Invalid mg/dL Casts: NS (Not Seen)    Nitrites: Invalid Trichomonas: Not Present    Leukocyte Esterase: Invalid leu/uL Mucous: Not Present      Epithelial Cells: NS (Not Seen)      Yeast: NS (Not Seen)      Sperm: Not Present    Notes: No dip \\T \ unspun micro due to color/clarity    ASSESSMENT:      ICD-10 Details  1 GU:   Renal calculus - N20.0 Chronic, Worsening - Given the left renal pelvic stone and the right distal ureteral stone we went over the nature risk benefits of continued surveillance stone passage, ureteroscopy which  I would recommend prestanding, ESWL and PCNL. All questions answered. I will set him up for ureteral stents tomorrow and sent urine for culture. We will start him on doxycycline and then arrange for ureteroscopy in 2 or 3 weeks. He had some stent pain last time hopefully that will be better with tamsulosin. All questions answered.  2   Gross hematuria - R31.0 Chronic, Worsening  4   Ureteral calculus - M60.0 Acute, Uncomplicated  3 NON-GU:   Bacteriuria - R82.71 Chronic, Stable - cx sent start abx    PLAN:            Medications New Meds: Doxycycline Hyclate 100 mg capsule 1 capsule PO BID   #14  0 Refill(s)            Orders Labs Urine Culture  X-Rays: C.T. Stone Protocol Without Contrast  X-Ray Notes: History:  Hematuria: Yes/No  Patient to see MD after exam: Yes/No  Previous exam: CT / IVP/ US/ KUB/  None  When:  Where:  Diabetic: Yes/ No  BUN/ Creatine:  Date of last BUN Creatinine:  Weight in pounds:  Allergy- Contrasts/ Shellfish: Yes/ No  Conflicting diabetic meds: Yes/ No  Oral contrast and instructions given to patient:   Prior Authorization #: [id number] valid 03/25/20 thru 04/24/20            Schedule Return Visit/Planned Activity: ASAP - Schedule Surgery          Document Letter(s):  Created for Patient: Clinical Summary         Notes:   cc: Dr. Laurance Flatten         Next Appointment:      Next Appointment: 03/25/2020 02:15 PM    Appointment Type: Office Visit Established Patient    Location: [facility] [phone]    Provider: [name], M.D.    Reason for Visit: follow up/ pt scheduled      * Signed by [name], M.D. on 03/25/20 at 5:55 PM (EDT)*       APPENDED NOTES:  CT also with colitis and we will notify his PCP.     * Signed by [name], M.D. on 03/25/20 at 6:04 PM (EDT)*       The information contained in this medical record document is considered private and confidential patient information. This information can only be used for the medical diagnosis and/or medical services that are being provided by the patient's selected caregivers. This information can only be distributed outside of the patient's care if the patient agrees and signs waivers of authorization for this information to be sent to an outside source or route.

## 2020-03-26 ENCOUNTER — Encounter (HOSPITAL_BASED_OUTPATIENT_CLINIC_OR_DEPARTMENT_OTHER)
Admission: RE | Disposition: A | Discharge status: Home / Self Care | Payer: Self-pay | Source: Home / Self Care | Attending: Urology

## 2020-03-26 ENCOUNTER — Ambulatory Visit (HOSPITAL_BASED_OUTPATIENT_CLINIC_OR_DEPARTMENT_OTHER): Payer: Medicare HMO | Admitting: Anesthesiology

## 2020-03-26 ENCOUNTER — Telehealth: Payer: Self-pay | Admitting: Nurse Practitioner

## 2020-03-26 ENCOUNTER — Encounter (HOSPITAL_BASED_OUTPATIENT_CLINIC_OR_DEPARTMENT_OTHER): Payer: Self-pay | Admitting: Urology

## 2020-03-26 ENCOUNTER — Ambulatory Visit (HOSPITAL_BASED_OUTPATIENT_CLINIC_OR_DEPARTMENT_OTHER)
Admission: RE | Admit: 2020-03-26 | Discharge: 2020-03-26 | Disposition: A | Discharge status: Home / Self Care | Payer: Medicare HMO | Attending: Urology | Admitting: Urology

## 2020-03-26 DIAGNOSIS — Z20822 Contact with and (suspected) exposure to covid-19: Secondary | ICD-10-CM | POA: Insufficient documentation

## 2020-03-26 DIAGNOSIS — Z5329 Procedure and treatment not carried out because of patient's decision for other reasons: Secondary | ICD-10-CM | POA: Insufficient documentation

## 2020-03-26 DIAGNOSIS — K529 Noninfective gastroenteritis and colitis, unspecified: Secondary | ICD-10-CM | POA: Insufficient documentation

## 2020-03-26 DIAGNOSIS — N201 Calculus of ureter: Secondary | ICD-10-CM | POA: Diagnosis not present

## 2020-03-26 HISTORY — DX: Type 2 diabetes mellitus without complications: E11.9

## 2020-03-26 HISTORY — DX: Diarrhea, unspecified: R19.7

## 2020-03-26 LAB — SARS CORONAVIRUS 2 (TAT 6-24 HRS): SARS Coronavirus 2: NEGATIVE

## 2020-03-26 SURGERY — CANCELLED PROCEDURE
Anesthesia: General | Laterality: Bilateral

## 2020-03-26 MED ORDER — LIDOCAINE 2% (20 MG/ML) 5 ML SYRINGE
INTRAMUSCULAR | Status: AC
  Filled 2020-03-26: qty 5

## 2020-03-26 MED ORDER — LACTATED RINGERS IV SOLN: INTRAVENOUS | Status: DC

## 2020-03-26 MED ORDER — CIPROFLOXACIN HCL 500 MG PO TABS: 500.0000 mg | ORAL_TABLET | Freq: Two times a day (BID) | ORAL | 0 refills | Status: DC

## 2020-03-26 MED ORDER — CIPROFLOXACIN IN D5W 400 MG/200ML IV SOLN
INTRAVENOUS | Status: AC
  Filled 2020-03-26: qty 200

## 2020-03-26 MED ORDER — METRONIDAZOLE 500 MG PO TABS: 500.0000 mg | ORAL_TABLET | Freq: Two times a day (BID) | ORAL | 0 refills | Status: DC

## 2020-03-26 MED ORDER — DEXAMETHASONE SODIUM PHOSPHATE 10 MG/ML IJ SOLN
INTRAMUSCULAR | Status: AC
  Filled 2020-03-26: qty 1

## 2020-03-26 MED ORDER — FENTANYL CITRATE (PF) 100 MCG/2ML IJ SOLN
INTRAMUSCULAR | Status: AC
  Filled 2020-03-26: qty 2

## 2020-03-26 MED ORDER — ONDANSETRON HCL 4 MG/2ML IJ SOLN
INTRAMUSCULAR | Status: AC
  Filled 2020-03-26: qty 2

## 2020-03-26 MED ORDER — CIPROFLOXACIN IN D5W 400 MG/200ML IV SOLN: 400.0000 mg | Freq: Once | INTRAVENOUS | Status: DC

## 2020-03-26 MED ORDER — PROPOFOL 10 MG/ML IV BOLUS
INTRAVENOUS | Status: AC
  Filled 2020-03-26: qty 40

## 2020-03-26 MED ORDER — PROPOFOL 10 MG/ML IV BOLUS
INTRAVENOUS | Status: AC
  Filled 2020-03-26: qty 20

## 2020-03-26 NOTE — Telephone Encounter (Signed)
DR. Junious Silk urologist called and says patient had CT scan and has colitis and is having diarrhea. He wanted [place] to treat him and he wll follo wup him for his nonobstructing kidney stones.

## 2020-03-26 NOTE — Discharge Instructions (Signed)

## 2020-03-26 NOTE — Progress Notes (Signed)
Mr. Treece presented the day with a 9 mm right distal stone and a 12 mm left proximal stone.  He was added on for urgent stents because he had a temperature in the office of 100.4 and bacteria in his urine.  However, on his CT scan there is no hydronephrosis and he has colitis.  Patient is having abdominal pain, bowel urgency and diarrhea.  He would really like to address the colitis prior to having to deal with stents and stent colic (he reports he had a lot of problems with the stents last time).  I spoke to NP Mary-Margaret and she is going to start patient on Cipro Flagyl.  I will get him back in the office in a week or 2 for a KUB and plan to set up cystoscopy with bilateral retrograde pyelogram, bilateral ureteroscopy holmium laser lithotripsy and stents at that time.  I discussed with the patient if he is stable we could try to get it all done in one setting but he might need a staged procedure.  All questions answered.  I gave him strict return precautions.  Procedure today canceled.

## 2020-03-26 NOTE — Anesthesia Preprocedure Evaluation (Addendum)
Anesthesia Evaluation  Patient identified by MRN, date of birth, ID band Patient awake    Reviewed: Allergy & Precautions, NPO status , Patient's Chart, lab work & pertinent test results  Airway Mallampati: II  TM Distance: >3 FB Neck ROM: Full    Dental no notable dental hx.    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 Apical and anteroseptal wall hypokinesis  There was no ST segment deviation noted during stress.  Defect 1: There is a large defect of severe severity present in the mid anteroseptal, mid inferoseptal, mid inferior, mid inferolateral, apical anterior, apical septal, apical inferior, apical lateral and apex location.  This is an intermediate risk study. Appears to be large prior infarct pattern as described above. No ischemia identified. :   Neuro/Psych    GI/Hepatic GERD  ,  Endo/Other  diabetes  Renal/GU      Musculoskeletal  (+) Arthritis ,   Abdominal   Peds  Hematology   Anesthesia Other Findings   Reproductive/Obstetrics                            Anesthesia Physical Anesthesia Plan  ASA: III  Anesthesia Plan: General   Post-op Pain Management:    Induction:   PONV Risk Score and Plan: 2 and Dexamethasone, Ondansetron and Treatment may vary due to age or medical condition  Airway Management Planned: LMA  Additional Equipment: None  Intra-op Plan:   Post-operative Plan: Extubation in OR  Informed Consent: I have reviewed the patients History and Physical, chart, labs and discussed the procedure including the risks, benefits and alternatives for the  proposed anesthesia with the patient or authorized representative who has indicated his/her understanding and acceptance.     Dental advisory given  Plan Discussed with: CRNA and Anesthesiologist  Anesthesia Plan Comments: (Last dose of Plavix 03/25/20. )       Anesthesia Quick Evaluation

## 2020-03-27 ENCOUNTER — Telehealth: Payer: Self-pay

## 2020-03-27 NOTE — Telephone Encounter (Signed)
REFERRAL REQUEST Telephone Note  Have you been seen at our office for this problem? yes (Advise that they may need an appointment with their PCP before a referral can be done)  Reason for Referral: GI docotr Referral discussed with patient: yes Best contact number of patient for referral team:  [phone]  Has patient been seen by a specialist for this issue before: no Patient provider preference for referral:  Patient location preference for referral: [facility]   Patient notified that referrals can take up to a week or longer to process. If they haven't heard anything within a week they should call back and speak with the referral department.   MMM's pt.  Please call pt.  He went to [facility] for colonoscopy to see Dr Ardis Hughs, but they do not have anything until December.  He wants to see someone else.

## 2020-03-30 NOTE — Telephone Encounter (Signed)
Patient was scheduled for a procedure for kidney stones last week, his urologist did a scan and saw this his colon was inflammed.  Urologist called and spoke with you and meds were called in for patient.  Patient contacted his GI doctor, Dr. Ardis Hughs, to schedule an appointment and is unable to be seen until 05/07/2020.  He wants to know if we could get him in quicker somewhere else.  I explained to the patient that the chances of seeing anyone else sooner than that were very slim and recommended he contact Dr. Ardis Hughs office a couple of times a week to see if they had a cancellation or to call them and ask to be put on a waiting list.  Patient voices understanding.

## 2020-03-30 NOTE — Telephone Encounter (Signed)
Sounds like referral has already been made. [facility] can call and seeif they can get him im sooner.

## 2020-04-15 DIAGNOSIS — E119 Type 2 diabetes mellitus without complications: Secondary | ICD-10-CM | POA: Diagnosis not present

## 2020-04-15 DIAGNOSIS — N202 Calculus of kidney with calculus of ureter: Secondary | ICD-10-CM | POA: Diagnosis not present

## 2020-04-15 DIAGNOSIS — I509 Heart failure, unspecified: Secondary | ICD-10-CM | POA: Diagnosis not present

## 2020-04-17 ENCOUNTER — Telehealth: Payer: Self-pay | Admitting: Cardiology

## 2020-04-17 ENCOUNTER — Telehealth: Payer: Self-pay

## 2020-04-17 MED ORDER — CLOPIDOGREL BISULFATE 75 MG PO TABS
75.0000 mg | ORAL_TABLET | Freq: Every day | ORAL | 3 refills | Status: DC
Start: 2020-04-17 — End: 2020-04-20

## 2020-04-17 NOTE — Telephone Encounter (Signed)
Refill complete 

## 2020-04-17 NOTE — Telephone Encounter (Signed)
Pt aware of provider feedback and voiced understanding. 

## 2020-04-17 NOTE — Telephone Encounter (Signed)
Pt states his appt with GI isn't till 05/07/20.

## 2020-04-17 NOTE — Telephone Encounter (Signed)
  Prescription Request  04/17/2020  What is the name of the medication or equipment? antibiotic  Have you contacted your pharmacy to request a refill? (if applicable) Yes  Which pharmacy would you like this sent to? [facility], [place]  Pt says MMM gave him a refill a few weeks ago and says he was doing fine will taking the antibiotic but since running out, his symptoms have come back and is requesting another refill.   Patient notified that their request is being sent to the clinical staff for review and that they should receive a response within 2 business days.

## 2020-04-17 NOTE — Telephone Encounter (Signed)
Need to know exactly what we re treating before doing another antibiotic. Has he seen GI?

## 2020-04-17 NOTE — Telephone Encounter (Signed)
Patient states that he is still having diarrhea.  Diarrhea was better on antibiotic and after completed diarrhea came back.  Patient would like to know what to do now and if he can have a refill on an antibiotic

## 2020-04-17 NOTE — Telephone Encounter (Signed)
Will just have to continue imodium until can see GI

## 2020-04-17 NOTE — Telephone Encounter (Signed)
New message      *STAT* If patient is at the pharmacy, call can be transferred to refill team.   1. Which medications need to be refilled? (please list name of each medication and dose if known)  plavix - he is completely out and mail order will not get it to him for 5-7 days   2. Which pharmacy/location (including street and city if local pharmacy) is medication to be sent to? [facility] [place]   3. Do they need a 30 day or 90 day supply? 1 week or 30 days

## 2020-04-20 ENCOUNTER — Other Ambulatory Visit: Payer: Self-pay | Admitting: *Deleted

## 2020-04-20 MED ORDER — CLOPIDOGREL BISULFATE 75 MG PO TABS
75.0000 mg | ORAL_TABLET | Freq: Every day | ORAL | 2 refills | Status: DC
Start: 2020-04-20 — End: 2020-08-28

## 2020-04-24 ENCOUNTER — Other Ambulatory Visit: Payer: Self-pay | Admitting: Cardiology

## 2020-05-05 ENCOUNTER — Ambulatory Visit: Payer: Medicare HMO | Admitting: Cardiology

## 2020-05-05 ENCOUNTER — Encounter: Payer: Self-pay | Admitting: Cardiology

## 2020-05-05 VITALS — BP 112/62 | HR 53 | Ht 71.0 in | Wt 222.0 lb

## 2020-05-05 DIAGNOSIS — I350 Nonrheumatic aortic (valve) stenosis: Secondary | ICD-10-CM

## 2020-05-05 DIAGNOSIS — E782 Mixed hyperlipidemia: Secondary | ICD-10-CM | POA: Diagnosis not present

## 2020-05-05 DIAGNOSIS — I2510 Atherosclerotic heart disease of native coronary artery without angina pectoris: Secondary | ICD-10-CM

## 2020-05-05 DIAGNOSIS — I10 Essential (primary) hypertension: Secondary | ICD-10-CM

## 2020-05-05 NOTE — Patient Instructions (Signed)
Your physician wants you to follow-up in: 6 MONTHS WITH DR BRANCH   Your physician recommends that you continue on your current medications as directed. Please refer to the Current Medication list given to you today.  Thank you for choosing [facility]!!   

## 2020-05-05 NOTE — Progress Notes (Signed)
Clinical Summary Hayden Rivera is a 69 y.o.male seen today for follow up of the following medical problems.  1. CAD - multiple PCIs, CABG in 02/2016 08/2017 nuclear stress no ischemia, large prior infarct - committed to indefinite DAPT due to long history of significant disease and interventions   - no recent chest pains, no SOB/DOE - compliant with meds. Cough on ACEI, has not been on ARB due to soft bp's   2. ICM - Jan 2019 echo LVEF 45-50%, akinesis mid-apicalanteroseptal wall. Grade II diastolic dysfunction - no LE edema, no SOB or DOE   3.Mild aortic stenosis Jan 2019 mild AS: mean grade 12, AVA VTI 1.81    4. HTN -cough on ramipril.  -compliant  with meds  5. Hyperlipidemia Jan 2020 TC 131 TG 182 HDL 45 LDL 50 -  labs with [facility] and pcp  07/2019 TC 127 TG 472 LDL direct 39 HgbA1c 12     SH: retired Scientist, product/process development Has had moderna covid vaccine x 3  Past Medical History:  Diagnosis Date  . Anginal pain ([place])    occ  . Aortic stenosis 06/16/2017   Mild, noted on ECHO  . Arthritis   . Atrial fibrillation ([place]) 02/2016   post op  . CAD (coronary artery disease)    a. Anterior STEMI 2005 c/b vfib arrest, PCI to LAD at [facility]. b. Stent thrombosis 2006 with DES within prior LAD stent at [facility]. C. 09/2012: s/p balloon angioplasty to mLAD for severe stenosis in previously stented segment & DES to LCx; initial enz neg but ruled in for NSTEMI after post-cath vagal sx, felt d/t distal emboliz of thrombus during case.  . Chronic back pain   . Dependent edema    Mild, occ NONE RECENT  . Diarrhea   . DM type 2 (diabetes mellitus, type 2) ([place])   . Elevated hemidiaphragm    a. Noted 09/2012 - instructed to f/u PCP.  Marland Kitchen Heart murmur   . History of kidney stones   . Hyperlipidemia   . Hypertension   . Ischemic cardiomyopathy    a. Unclear prior EF but pt was told heart was weakened in past. b. EF normal 09/2012.  Marland Kitchen Myocardial infarction  ([place]) 2005   TOTAL MI'S 4 LAST ONE 2017  . Retinal tear, right   . Ventricular fibrillation ([place])    a. VF arrest 2005 in setting of STEMI.  . Wears glasses      Allergies  Allergen Reactions  . Altace [Ramipril] Shortness Of Breath and Other (See Comments)    cough  . Penicillins Swelling    Has patient had a PCN reaction causing immediate rash, facial/tongue/throat swelling, SOB or lightheadedness with hypotension:unsure Has patient had a PCN reaction causing severe rash involving mucus membranes or skin necrosis:unsure Has patient had a PCN reaction that required hospitalization:No Has patient had a PCN reaction occurring within the last 10 years:NO If all of the above answers are "NO", then may proceed with Cephalosporin use.  Childhood reaction       Current Outpatient Medications  Medication Sig Dispense Refill  . acetaminophen (TYLENOL) 500 MG tablet Take 500 mg by mouth every 6 (six) hours as needed (PAIN).     Marland Kitchen aspirin 81 MG tablet Take 1 tablet (81 mg total) by mouth daily.    . cetirizine (ZYRTEC) 10 MG tablet Take 10 mg by mouth daily as needed.     . ciprofloxacin (CIPRO) 500 MG tablet Take  1 tablet (500 mg total) by mouth 2 (two) times daily. 14 tablet 0  . clopidogrel (PLAVIX) 75 MG tablet Take 1 tablet (75 mg total) by mouth daily. 90 tablet 2  . finasteride (PROSCAR) 5 MG tablet TAKES 1/4 TABLET IN PM    . isosorbide mononitrate (IMDUR) 30 MG 24 hr tablet TAKE 1 TABLET EVERY DAY 90 tablet 1  . meclizine (ANTIVERT) 12.5 MG tablet Take 1 tablet (12.5 mg total) by mouth 3 (three) times daily as needed for dizziness. (Patient not taking: Reported on 02/28/2020) 30 tablet 0  . metFORMIN (GLUCOPHAGE) 500 MG tablet Take 500 mg by mouth 2 (two) times daily with a meal.    . metoprolol succinate (TOPROL-XL) 25 MG 24 hr tablet TAKE 1/2 TABLET EVERY DAY (Patient taking differently: every evening. ) 45 tablet 3  . metroNIDAZOLE (FLAGYL) 500 MG tablet Take 1 tablet (500 mg  total) by mouth 2 (two) times daily. 14 tablet 0  . rosuvastatin (CRESTOR) 20 MG tablet TAKE 1 TABLET EVERY DAY 90 tablet 3  . TURMERIC PO Take 2 capsules by mouth daily.    Marland Kitchen UNABLE TO FIND Med Name: IMMODIUM PRN     No current facility-administered medications for this visit.     Past Surgical History:  Procedure Laterality Date  . BACK SURGERY  1997   UPPER CERVICAL C 3 TO C 4 FUSION  . CARDIAC CATHETERIZATION     stents x2  . CARDIAC CATHETERIZATION N/A 10/02/2014   Procedure: Left Heart Cath And Coronary Angiography;  Surgeon: Larey Dresser, MD;  Location: [facility] INVASIVE CV LAB CUPID;  Service: Cardiovascular;  Laterality: N/A;  . CARDIAC CATHETERIZATION N/A 02/17/2016   Procedure: Left Heart Cath and Coronary Angiography;  Surgeon: Larey Dresser, MD;  Location: [facility] CV LAB;  Service: Cardiovascular;  Laterality: N/A;  . CORONARY ARTERY BYPASS GRAFT N/A 03/01/2016   Procedure: CORONARY ARTERY BYPASS GRAFTING (CABG)x3 with endoscopic harvesting of right saphenous vein -LIMA to LAD -SVG to LEFT CIRCUMFLEX -RADIAL ARTERY to RAMUS INTERMEDIA;  Surgeon: Grace Isaac, MD;  Location: [facility];  Service: Open Heart Surgery;  Laterality: N/A;  . CORONARY STENT PLACEMENT  2005 AND REAPLCED 2006, 2014  . CYSTOSCOPY/URETEROSCOPY/HOLMIUM LASER/STENT PLACEMENT Bilateral 05/29/2018   Procedure: CYSTOSCOPY/RETROGRADE/URETEROSCOPY/HOLMIUM LASER/STENT PLACEMENT/ BASKET STONE EXTRACTION;  Surgeon: [name], MD;  Location: [facility];  Service: Urology;  Laterality: Bilateral;  . EYE EXAMINATION UNDER ANESTHESIA W/ RETINAL CRYOTHERAPY AND RETINAL LASER Right 2018  . HAND SURGERY Left 1990'S   hamick bone rem  . KNEE ARTHROSCOPY Right 10/24/2014   Procedure: ARTHROSCOPY RIGHT KNEE, Partial medial menisectomy and chondroplasty;  Surgeon: Melrose Nakayama, MD;  Location: [place];  Service: Orthopedics;  Laterality: Right;  . LEFT HEART CATH N/A 10/11/2012   Procedure: LEFT HEART  CATH;  Surgeon: Sherren Mocha, MD;  Location: [facility] CATH LAB;  Service: Cardiovascular;  Laterality: N/A;  . LITHOTRIPSY  EARLY 2000'S   X 2 OR 3  . RADIAL ARTERY HARVEST Left 03/01/2016   Procedure: LEFT RADIAL ARTERY HARVEST;  Surgeon: Grace Isaac, MD;  Location: [place];  Service: Open Heart Surgery;  Laterality: Left;  . SPLIT NIGHT STUDY  09/21/2015  . TEE WITHOUT CARDIOVERSION N/A 03/01/2016   Procedure: TRANSESOPHAGEAL ECHOCARDIOGRAM (TEE);  Surgeon: Grace Isaac, MD;  Location: [facility];  Service: Open Heart Surgery;  Laterality: N/A;  . TONSILLECTOMY  AS CHILD     Allergies  Allergen Reactions  . Altace [Ramipril]  Shortness Of Breath and Other (See Comments)    cough  . Penicillins Swelling    Has patient had a PCN reaction causing immediate rash, facial/tongue/throat swelling, SOB or lightheadedness with hypotension:unsure Has patient had a PCN reaction causing severe rash involving mucus membranes or skin necrosis:unsure Has patient had a PCN reaction that required hospitalization:No Has patient had a PCN reaction occurring within the last 10 years:NO If all of the above answers are "NO", then may proceed with Cephalosporin use.  Childhood reaction        Family History  Problem Relation Age of Onset  . Heart disease Mother   . Stroke Father   . Healthy Brother   . Colon cancer Neg Hx   . Esophageal cancer Neg Hx   . Rectal cancer Neg Hx   . Stomach cancer Neg Hx      Social History Hayden Rivera reports that he quit smoking about 17 years ago. His smoking use included cigars and cigarettes. He has a 5.00 pack-year smoking history. He has never used smokeless tobacco. Hayden Rivera reports previous alcohol use.   Review of Systems CONSTITUTIONAL: No weight loss, fever, chills, weakness or fatigue.  HEENT: Eyes: No visual loss, blurred vision, double vision or yellow sclerae.No hearing loss, sneezing, congestion, runny nose or sore throat.  SKIN: No rash or  itching.  CARDIOVASCULAR: per hpi RESPIRATORY: No shortness of breath, cough or sputum.  GASTROINTESTINAL: No anorexia, nausea, vomiting or diarrhea. No abdominal pain or blood.  GENITOURINARY: No burning on urination, no polyuria NEUROLOGICAL: No headache, dizziness, syncope, paralysis, ataxia, numbness or tingling in the extremities. No change in bowel or bladder control.  MUSCULOSKELETAL: No muscle, back pain, joint pain or stiffness.  LYMPHATICS: No enlarged nodes. No history of splenectomy.  PSYCHIATRIC: No history of depression or anxiety.  ENDOCRINOLOGIC: No reports of sweating, cold or heat intolerance. No polyuria or polydipsia.  Marland Kitchen   Physical Examination Today's Vitals   05/05/20 0916  BP: 112/62  Pulse: (!) 53  SpO2: 95%  Weight: 222 lb (100.7 kg)  Height: 5\' 11"  (1.803 m)   Body mass index is 30.96 kg/m.  Gen: resting comfortably, no acute distress HEENT: no scleral icterus, pupils equal round and reactive, no palptable cervical adenopathy,  CV: RRR, 2/6 systolic murmur rusb, no jvd Resp: Clear to auscultation bilaterally GI: abdomen is soft, non-tender, non-distended, normal bowel sounds, no hepatosplenomegaly MSK: extremities are warm, no edema.  Skin: warm, no rash Neuro:  no focal deficits Psych: appropriate affect   Diagnostic Studies  08/2017 nuclear stress  Nuclear stress EF: 48%. Apical and anteroseptal wall hypokinesis  There was no ST segment deviation noted during stress.  Defect 1: There is a large defect of severe severity present in the mid anteroseptal, mid inferoseptal, mid inferior, mid inferolateral, apical anterior, apical septal, apical inferior, apical lateral and apex location.  This is an intermediate risk study. Appears to be large prior infarct pattern as described above. No ischemia identified.  Jan 2019 echo Study Conclusions  - Left ventricle: The cavity size was normal. Wall thickness was increased in a pattern of mild  LVH. There was mild focal basal hypertrophy of the septum. Systolic function was mildly reduced. The estimated ejection fraction was in the range of 45% to 50%. There is akinesis of the mid-apicalanteroseptal myocardium. Features are consistent with a pseudonormal left ventricular filling pattern, with concomitant abnormal relaxation and increased filling pressure (grade 2 diastolic dysfunction). - Aortic valve: Valve mobility was  restricted. There was mild stenosis. There was trivial regurgitation. - Ascending aorta: The ascending aorta was mildly dilated. - Pulmonary arteries: Systolic pressure was mildly increased. PA peak pressure: 36 mm Hg (S).  Impressions:  - Akinesis of the distal septum/apex; overall mildly reduced LV systolic function; moderate diastolic dysfunction; calcified aortic valve with fixed noncoronary cusp; mild AS (mean gradient 12 mmHg) and trace AI; milldy dilated ascending aorta; mild TR with mildly elevated pulmonary pressure   Assessment and Plan  1. CAD/ ICM - committed to indefinite DAPT due to long history of significant disease and interventions - no recent symptoms, continue current meds  2. Aortic stenosis -mild by echo -we will repeat next year after our next f/u  3. HTN - he is at goal, continue current meds  4. Hyperlipidemia - LDL at goal, high TGs that should improve with improved control of his DM2, las HgbA1c was 12. Defer DM2 management to pcp      Arnoldo Lenis, M.D.

## 2020-05-07 ENCOUNTER — Telehealth: Payer: Self-pay

## 2020-05-07 ENCOUNTER — Encounter: Payer: Self-pay | Admitting: Nurse Practitioner

## 2020-05-07 ENCOUNTER — Ambulatory Visit: Payer: Medicare HMO | Admitting: Nurse Practitioner

## 2020-05-07 VITALS — BP 100/70 | HR 57 | Ht 71.0 in | Wt 220.6 lb

## 2020-05-07 DIAGNOSIS — Z9861 Coronary angioplasty status: Secondary | ICD-10-CM | POA: Diagnosis not present

## 2020-05-07 DIAGNOSIS — R197 Diarrhea, unspecified: Secondary | ICD-10-CM | POA: Diagnosis not present

## 2020-05-07 DIAGNOSIS — Z86010 Personal history of colonic polyps: Secondary | ICD-10-CM

## 2020-05-07 DIAGNOSIS — I2510 Atherosclerotic heart disease of native coronary artery without angina pectoris: Secondary | ICD-10-CM

## 2020-05-07 MED ORDER — NA SULFATE-K SULFATE-MG SULF 17.5-3.13-1.6 GM/177ML PO SOLN: 1.0000 | Freq: Once | ORAL | 0 refills | Status: AC

## 2020-05-07 NOTE — Patient Instructions (Signed)
Your provider has requested that you go to the basement level for lab work before leaving today. Press "B" on the elevator. The lab is located at the first door on the left as you exit the elevator.  Please pick up the stool container but only collect stool sample if having loose watery stools.   Call our office if your diarrhea recurs.   You can choose to start a probiotic such as Hardin Negus colon health or Align daily. This is over the counter.   You have been scheduled for a colonoscopy. Please follow written instructions given to you at your visit today.  Please pick up your prep supplies at the pharmacy within the next 1-3 days. If you use inhalers (even only as needed), please bring them with you on the day of your procedure.

## 2020-05-07 NOTE — Telephone Encounter (Signed)
Hi. Dr. Harl Bowie,  Hayden Rivera is scheduled for a colonoscopy on 05/15/2020 and is being asked to hold Plavix for 5 days. He has a history of CAD with multiple PCIs and CABG in 02/2016. Myoview in 08/2017 showed large prior infarct pattern but no ischemia. You recently saw patient on 05/05/2020 and he was doing well from a cardiac standpoint. Can Plavix be held for 5 days prior to colonoscopy?  Please route response to P CV DIV PREOP.  Thank you! Lizzete Gough

## 2020-05-07 NOTE — Progress Notes (Addendum)
05/07/2020 Hayden Rivera [id number] 23-Jan-1951   Chief Complaint: Diarrhea  History of Present Illness: Hayden Rivera is a 69 year old male with a past medical history significant for hypertension, hyperlipidemia, coronary artery disease s/p  STEMI 2005 with v fib arrest, multiple PCI's with 3 coronary stents on Plavix s/p 3 vessel CABG in 2017 with post op atrial fibrillation, ischemic cardiomyopathy with LV EF 45 - 50% per ECHO 05/2017, mild aortic stenosis, diabetes mellitus type 2, kidney stones, chronic back pain and colon polyps.  He presents to our office today for further evaluation regarding diarrhea which started approximately 3 months ago.  He was also having left-sided abdominal to flank pain.  No new medications or antibiotics prior to the onset of his diarrhea. He was evaluated by Ronnald Collum NP 10/1 and stool cultures 10/4 were negative.  He was empirically prescribed Cipro 500 mg p.o. twice daily for 10 days and his diarrhea improved for about 1 week then but his diarrhea recurred 3 to 4 days later.  He underwent a renal protocol CT by his urologist due to having kidney stones which identified a 13m right distal stone and a 123mleft proximal stone and colitis per Dr. EsLyndal Rainbowffice note 04/26/2020. Plans for lithotripsy and renal stent placement was cancelled in the setting of colitis. He was prescribed Cipro and Flagyl  and he was advised to follow up in our office for further GI evaluation.  He continued to pass 4-5 nonbloody watery diarrhea bowel movements daily for the next week, however, for the past 4 days he is passing 3 to 5 soft stools which fall apart in the toilet water. No blood or mucous per the rectum. He continues to have left flank pain, no LLQ pain at this time. No fever. His most recent colonoscopy was 08/01/2018, 12 polyps were removed. One of the polyps was a 2043mubular adenoma at the hepatic flexure which was removed and tattooed. He was advised to repeat a  colonoscopy in 6 months to assess for any residual polyp. However, due to the Covid pandemic he did not schedule the follow up colonoscopy.   Significant history of coronary artery disease as noted above. He saw his cardiologist Dr. BraHarl Bowie 05/05/2020, at that time his BP was 112/62 and  HR 53 b/min. His cardiac status was stable with plans for indefinite DAPT and to repeat an ECHO in one year. He denies having any CP, palpitations or SOB. His BP in office today is a bit low 100/70. HR. 57.   Labs 02/28/2020: WBC 9.1. Hg 14.5. HCT 42.2. PLT 137. BUN 12. Cr 0.90. T. Bili 0.9. Alk phos 92. AST 22. ALT 21.    Colonoscopy 08/01/2018:  - Seven 2 to 9 mm polyps in the transverse colon, in the ascending colon and in the cecum, removed with a cold snare. Resected and retrieved. - One 20 mm polyp at the hepatic flexure, removed piecemeal using a hot snare. Resected and retrieved. The site was labeled with submucosal injection of SPOT to aid in future localization. - Four 3 to 7 mm polyps in the descending colon, removed with a cold snare. Resected and retrieved. - The examination was otherwise normal on direct and retroflexion views. 1. Surgical [P], ascending colon, cecum, transverse, polyp (7) - MULTIPLE FRAGMENTS OF TUBULAR ADENOMA(S) - NO HIGH GRADE DYSPLASIA OR MALIGNANCY IDENTIFIED 2. Surgical [P], hepatic flexure, polyp - MULTIPLE FRAGMENTS OF TUBULAR ADENOMA(S) - NO HIGH GRADE DYSPLASIA OR MALIGNANCY  IDENTIFIED 3. Surgical [P], descending colon, polyp (4) - TUBULAR ADENOMA (4 OF 5 FRAGMENTS) - BENIGN COLONIC MUCOSA (1 OF 5 FRAGMENTS) - NO HIGH GRADE DYSPLASIA OR MALIGNANCY IDENTIFIED  08/2017 nuclear stress  Nuclear stress EF: 48%. Apical and anteroseptal wall hypokinesis  There was no ST segment deviation noted during stress.  Defect 1: There is a large defect of severe severity present in the mid anteroseptal, mid inferoseptal, mid inferior, mid inferolateral, apical anterior, apical  septal, apical inferior, apical lateral and apex location.  This is an intermediate risk study. Appears to be large prior infarct pattern as described above. No ischemia identified.  Jan 2019 echo - Left ventricle: The cavity size was normal. Wall thickness was increased in a pattern of mild LVH. There was mild focal basal hypertrophy of the septum. Systolic function was mildly reduced. The estimated ejection fraction was in the range of 45% to 50%. There is akinesis of the mid-apicalanteroseptal myocardium. Features are consistent with a pseudonormal left ventricular filling pattern, with concomitant abnormal relaxation and increased filling pressure (grade 2 diastolic dysfunction). - Aortic valve: Valve mobility was restricted. There was mild stenosis. There was trivial regurgitation. - Ascending aorta: The ascending aorta was mildly dilated. - Pulmonary arteries: Systolic pressure was mildly increased. PA peak pressure: 36 mm Hg (S).  Current Outpatient Medications on File Prior to Visit  Medication Sig Dispense Refill  . acetaminophen (TYLENOL) 500 MG tablet Take 500 mg by mouth every 6 (six) hours as needed (PAIN).     Marland Kitchen aspirin 81 MG tablet Take 1 tablet (81 mg total) by mouth daily.    . cetirizine (ZYRTEC) 10 MG tablet Take 10 mg by mouth daily as needed.     . clopidogrel (PLAVIX) 75 MG tablet Take 1 tablet (75 mg total) by mouth daily. 90 tablet 2  . finasteride (PROSCAR) 5 MG tablet TAKES 1/4 TABLET IN PM    . isosorbide mononitrate (IMDUR) 30 MG 24 hr tablet TAKE 1 TABLET EVERY DAY 90 tablet 1  . meclizine (ANTIVERT) 12.5 MG tablet Take 1 tablet (12.5 mg total) by mouth 3 (three) times daily as needed for dizziness. 30 tablet 0  . metFORMIN (GLUCOPHAGE) 500 MG tablet Take 500 mg by mouth 2 (two) times daily with a meal.    . metoprolol succinate (TOPROL-XL) 25 MG 24 hr tablet TAKE 1/2 TABLET EVERY DAY (Patient taking differently: every evening.) 45 tablet 3   . rosuvastatin (CRESTOR) 20 MG tablet TAKE 1 TABLET EVERY DAY 90 tablet 3  . TURMERIC PO Take 2 capsules by mouth daily.    Marland Kitchen UNABLE TO FIND Med Name: IMMODIUM PRN     No current facility-administered medications on file prior to visit.   Allergies  Allergen Reactions  . Altace [Ramipril] Shortness Of Breath and Other (See Comments)    cough  . Penicillins Swelling    Has patient had a PCN reaction causing immediate rash, facial/tongue/throat swelling, SOB or lightheadedness with hypotension:unsure Has patient had a PCN reaction causing severe rash involving mucus membranes or skin necrosis:unsure Has patient had a PCN reaction that required hospitalization:No Has patient had a PCN reaction occurring within the last 10 years:NO If all of the above answers are "NO", then may proceed with Cephalosporin use.  Childhood reaction      Current Medications, Allergies, Past Medical History, Past Surgical History, Family History and Social History were reviewed in [employer] record.   Review of Systems:   Constitutional: Negative  for fever, sweats, chills or weight loss.  Respiratory: Negative for shortness of breath.   Cardiovascular: Negative for chest pain, palpitations and leg swelling.  Gastrointestinal: See HPI.  Musculoskeletal: Negative for back pain or muscle aches.  Neurological: Negative for dizziness, headaches or paresthesias.    Physical Exam: BP 100/70   Pulse (!) 57   Ht _0  (1.803 m)   Wt 220 lb 9.6 oz (100.1 kg)   SpO2 98%   BMI 30.77 kg/m  General: 69 year old male in no acute distress. Head: Normocephalic and atraumatic. Eyes: No scleral icterus. Conjunctiva pink . Ears: Normal auditory acuity. Mouth: Dentition intact. No ulcers or lesions.  Lungs: Clear throughout to auscultation. Heart: Regular rate and rhythm, systolic murmur. Abdomen: Soft, nontender and nondistended. No masses or hepatomegaly. Normal bowel sounds x 4  quadrants.  Rectal: Deferred.  Musculoskeletal: Symmetrical with no gross deformities. Extremities: No edema. Neurological: Alert oriented x 4. No focal deficits.  Psychological: Alert and cooperative. Normal mood and affect  Assessment and Recommendations:  37.  69 year old male with diarrhea x 3 months, improving over the past 4 to 5 days. CTAP 03/25/2020 reportedly showed evidence of colitis. Patient is requiring GI clearance prior to renal stone surgery.  -Request copy of CTAP 03/25/2020 done at [facility] -CBC, CMP, CRP and GI pathogen panel -Colonoscopy benefits and risks discussed including risk with sedation, risk of bleeding, perforation and infection  -Our office will contact Dr. Harl Bowie to verify Plavix instructions prior to his colonoscopy  -Colonoscopy tentatively scheduled with Dr. Ardis Hughs on 05/15/2020. I will consult with Dr. Ardis Hughs to verify if 12/17 is an appropriate date to proceed with a colonoscopy in the setting of improving diarrhea/colitis requiring GI clearance prior to kidney surgery/renal stent placement.   2.  Kidney stones with left flank pain, near future lithotripsy and renal stent placement per urology   3.  History of colon polys, 12 polyps including one  40m TA polyp removed during a colonoscopy 07/2018. Repeat colonoscopy in 6 months was recommended but not done.  -See plan in # 1  4. Significant history of CAD on Plavix and ASA. HR stable 57 on Metoprolol.  BP low 100/70 in office today. Patient is asymptomatic. -Advised patient to contact his cardiologist and PCP to have BP rechecked within the next few days  5. DM II  6. Thrombocytopenia. PLT 146 - 137. No history of liver disease.  -Consider abd sono to evaluate the liver. Await noncontrast CT report.   ADDENDUM: CTAP without contrast 03/25/2020 record received. 1. Proctocolitis with stranding abut the rectum and sigmoid particular are moderate to severe with moderate stranding about the  descending colon. Correlate with any recent antibiotics or other risk factors for colitis. Diverticulosis through the length of involvement, would argue against diverticulitis. No free air or abscess.  2. Enlarging calculus in the right renal pelvis without hydronephrosis 3. Interval migration of calculi from the right kidney into the distal right ureter.  4. Lobular hepatic contours and borderline splenomegaly

## 2020-05-07 NOTE — Telephone Encounter (Signed)
[facility] Pre-operative Risk Assessment     Request for surgical clearance:     Endoscopy Procedure  What type of surgery is being performed?     Colonoscopy  When is this surgery scheduled?     05/15/20  What type of clearance is required ?   Pharmacy  Are there any medications that need to be held prior to surgery and how long? Plavix x 5 days  Practice name and name of physician performing surgery?      [facility]  What is your office phone and fax number?      Phone- [phone]  [phone]  Anesthesia type (None, local, MAC, general) ?       MAC

## 2020-05-08 ENCOUNTER — Telehealth: Payer: Self-pay | Admitting: Cardiology

## 2020-05-08 ENCOUNTER — Telehealth: Payer: Self-pay

## 2020-05-08 ENCOUNTER — Other Ambulatory Visit (INDEPENDENT_AMBULATORY_CARE_PROVIDER_SITE_OTHER): Payer: Medicare HMO

## 2020-05-08 DIAGNOSIS — I2510 Atherosclerotic heart disease of native coronary artery without angina pectoris: Secondary | ICD-10-CM

## 2020-05-08 DIAGNOSIS — Z86010 Personal history of colonic polyps: Secondary | ICD-10-CM | POA: Diagnosis not present

## 2020-05-08 DIAGNOSIS — Z9861 Coronary angioplasty status: Secondary | ICD-10-CM | POA: Diagnosis not present

## 2020-05-08 DIAGNOSIS — R197 Diarrhea, unspecified: Secondary | ICD-10-CM | POA: Diagnosis not present

## 2020-05-08 LAB — CBC WITH DIFFERENTIAL/PLATELET
Basophils Absolute: 0.1 10*3/uL (ref 0.0–0.1)
Basophils Relative: 1.2 % (ref 0.0–3.0)
Eosinophils Absolute: 0.3 10*3/uL (ref 0.0–0.7)
Eosinophils Relative: 3.6 % (ref 0.0–5.0)
HCT: 40.9 % (ref 39.0–52.0)
Hemoglobin: 13.6 g/dL (ref 13.0–17.0)
Lymphocytes Relative: 31.5 % (ref 12.0–46.0)
Lymphs Abs: 2.4 10*3/uL (ref 0.7–4.0)
MCHC: 33.4 g/dL (ref 30.0–36.0)
MCV: 96.7 fl (ref 78.0–100.0)
Monocytes Absolute: 0.8 10*3/uL (ref 0.1–1.0)
Monocytes Relative: 10.6 % (ref 3.0–12.0)
Neutro Abs: 4 10*3/uL (ref 1.4–7.7)
Neutrophils Relative %: 53.1 % (ref 43.0–77.0)
Platelets: 109 10*3/uL — ABNORMAL LOW (ref 150.0–400.0)
RBC: 4.23 Mil/uL (ref 4.22–5.81)
RDW: 14.1 % (ref 11.5–15.5)
WBC: 7.6 10*3/uL (ref 4.0–10.5)

## 2020-05-08 LAB — COMPREHENSIVE METABOLIC PANEL
ALT: 29 U/L (ref 0–53)
AST: 28 U/L (ref 0–37)
Albumin: 4.6 g/dL (ref 3.5–5.2)
Alkaline Phosphatase: 72 U/L (ref 39–117)
BUN: 19 mg/dL (ref 6–23)
CO2: 24 mEq/L (ref 19–32)
Calcium: 9.4 mg/dL (ref 8.4–10.5)
Chloride: 107 mEq/L (ref 96–112)
Creatinine, Ser: 1.11 mg/dL (ref 0.40–1.50)
GFR: 67.48 mL/min (ref >60.00)
Glucose, Bld: 99 mg/dL (ref 70–99)
Potassium: 3.8 mEq/L (ref 3.5–5.1)
Sodium: 141 mEq/L (ref 135–145)
Total Bilirubin: 0.7 mg/dL (ref 0.2–1.2)
Total Protein: 7.6 g/dL (ref 6.0–8.3)

## 2020-05-08 LAB — C-REACTIVE PROTEIN: CRP: <1 mg/dL (ref 0.5–20.0)

## 2020-05-08 NOTE — Telephone Encounter (Signed)
Ok to hold plavix as needed for GI procedure   J Emree Locicero MD

## 2020-05-08 NOTE — Progress Notes (Signed)
I agree with the above note, plan.  Will need to watch his stool testing results closely.  If they are positive for an obvious infection or if they are still pending by the time of his colonoscopy next week then the case will need to be postponed or canceled.

## 2020-05-08 NOTE — Telephone Encounter (Signed)
I called Dr Cammy Copa office and spoke with Alma Friendly. She is going to let Dr Harl Bowie know we need this today.

## 2020-05-08 NOTE — Telephone Encounter (Signed)
Patient informed and verbalized understanding to hold Plavix for 5 days.

## 2020-05-08 NOTE — Telephone Encounter (Signed)
Just touching base about patient's Plavix  if approved will need to start holding Sunday. Thank you for your help.

## 2020-05-08 NOTE — Telephone Encounter (Signed)
   Primary Cardiologist: Carlyle Dolly, MD  Chart reviewed as part of pre-operative protocol coverage. Patient is scheduled for a colonoscopy on 05/15/2020 and we were asked to give our recommendations for holding Plavix.   Per Dr. Harl Bowie, Hayden Rivera to hold Plavix as requested. This should be restarted as soon as possible after colonoscopy.   I will route this recommendation to the requesting party via Epic fax function and remove from pre-op pool.  Please call with questions.  Darreld Mclean, PA-C 05/08/2020, 2:09 PM

## 2020-05-08 NOTE — Telephone Encounter (Signed)
Arnoldo Lenis, MD     05/08/20 2:00 PM Note   Ok to hold plavix as needed for GI procedure   J BrancH MD

## 2020-05-08 NOTE — Telephone Encounter (Signed)
Patient was seen in office 05/07/20. He did not go to the lab for his blood work and stool collection containers as instructed. Called the patient today. He states he cannot come back today. He [place] half away. Asked patient to come back on Monday. He said he would try to do that.

## 2020-05-12 ENCOUNTER — Telehealth: Payer: Self-pay | Admitting: Nurse Practitioner

## 2020-05-12 NOTE — Telephone Encounter (Signed)
Yes, ok to proceed with the procedure  thanks

## 2020-05-12 NOTE — Telephone Encounter (Signed)
Dr. Ardis Hughs, Refer to office visit 12/9. I called the patient this am. He is no longer having diarrhea therefore he did not complete the GI pathogen panel. He stated he is passing soft formed stools daily since his office appointment last week. Stool culture and C. Diff PCR 03/02/2020 were negative. Please verify if ok to proceed with his colonoscopy Friday 04/2016 as scheduled.

## 2020-05-12 NOTE — Telephone Encounter (Signed)
I called patient and he is aware Dr. Ardis Hughs verified he is ok to proceed with his colonoscopy on Fri 05/15/2020 as scheduled. Patient will call our office if he develops diarrhea prior to his prep day.

## 2020-05-15 ENCOUNTER — Other Ambulatory Visit: Payer: Self-pay

## 2020-05-15 ENCOUNTER — Ambulatory Visit (AMBULATORY_SURGERY_CENTER): Payer: Medicare HMO | Admitting: Gastroenterology

## 2020-05-15 ENCOUNTER — Encounter: Payer: Self-pay | Admitting: Gastroenterology

## 2020-05-15 VITALS — BP 117/67 | HR 63 | Temp 97.3°F | Resp 20 | Ht 71.0 in | Wt 220.0 lb

## 2020-05-15 DIAGNOSIS — K5730 Diverticulosis of large intestine without perforation or abscess without bleeding: Secondary | ICD-10-CM

## 2020-05-15 DIAGNOSIS — Z86010 Personal history of colonic polyps: Secondary | ICD-10-CM | POA: Diagnosis not present

## 2020-05-15 DIAGNOSIS — K5140 Inflammatory polyps of colon without complications: Secondary | ICD-10-CM

## 2020-05-15 DIAGNOSIS — R197 Diarrhea, unspecified: Secondary | ICD-10-CM

## 2020-05-15 DIAGNOSIS — D124 Benign neoplasm of descending colon: Secondary | ICD-10-CM

## 2020-05-15 DIAGNOSIS — K648 Other hemorrhoids: Secondary | ICD-10-CM | POA: Diagnosis not present

## 2020-05-15 MED ORDER — SODIUM CHLORIDE 0.9 % IV SOLN: 500.0000 mL | Freq: Once | INTRAVENOUS | Status: DC

## 2020-05-15 NOTE — Progress Notes (Signed)
Vs in adm by [employer]

## 2020-05-15 NOTE — Progress Notes (Signed)
Called to room to assist during endoscopic procedure.  Patient ID and intended procedure confirmed with present staff. Received instructions for my participation in the procedure from the performing physician.  

## 2020-05-15 NOTE — Progress Notes (Signed)
To PACU, VSS. Report to [email or web address] 

## 2020-05-15 NOTE — Patient Instructions (Signed)
RESUME Plavix tomorrow at prior dose  Handouts Provided:  Polyps   YOU HAD AN ENDOSCOPIC PROCEDURE TODAY AT [facility]:   Refer to the procedure report that was given to you for any specific questions about what was found during the examination.  If the procedure report does not answer your questions, please call your gastroenterologist to clarify.  If you requested that your care partner not be given the details of your procedure findings, then the procedure report has been included in a sealed envelope for you to review at your convenience later.  YOU SHOULD EXPECT: Some feelings of bloating in the abdomen. Passage of more gas than usual.  Walking can help get rid of the air that was put into your GI tract during the procedure and reduce the bloating. If you had a lower endoscopy (such as a colonoscopy or flexible sigmoidoscopy) you may notice spotting of blood in your stool or on the toilet paper. If you underwent a bowel prep for your procedure, you may not have a normal bowel movement for a few days.  Please Note:  You might notice some irritation and congestion in your nose or some drainage.  This is from the oxygen used during your procedure.  There is no need for concern and it should clear up in a day or so.  SYMPTOMS TO REPORT IMMEDIATELY:   Following lower endoscopy (colonoscopy or flexible sigmoidoscopy):  Excessive amounts of blood in the stool  Significant tenderness or worsening of abdominal pains  Swelling of the abdomen that is new, acute  Fever of 100F or higher  For urgent or emergent issues, a gastroenterologist can be reached at any hour by calling [phone]. Do not use MyChart messaging for urgent concerns.    DIET:  We do recommend a small meal at first, but then you may proceed to your regular diet.  Drink plenty of fluids but you should avoid alcoholic beverages for 24 hours.  ACTIVITY:  You should plan to take it easy for the rest of today  and you should NOT DRIVE or use heavy machinery until tomorrow (because of the sedation medicines used during the test).    FOLLOW UP: Our staff will call the number listed on your records 48-72 hours following your procedure to check on you and address any questions or concerns that you may have regarding the information given to you following your procedure. If we do not reach you, we will leave a message.  We will attempt to reach you two times.  During this call, we will ask if you have developed any symptoms of COVID 19. If you develop any symptoms (ie: fever, flu-like symptoms, shortness of breath, cough etc.) before then, please call [phone].  If you test positive for Covid 19 in the 2 weeks post procedure, please call and report this information to [place].    If any biopsies were taken you will be contacted by phone or by letter within the next 1-3 weeks.  Please call us at [phone] if you have not heard about the biopsies in 3 weeks.    SIGNATURES/CONFIDENTIALITY: You and/or your care partner have signed paperwork which will be entered into your electronic medical record.  These signatures attest to the fact that that the information above on your After Visit Summary has been reviewed and is understood.  Full responsibility of the confidentiality of this discharge information lies with you and/or your care-partner.

## 2020-05-15 NOTE — Op Note (Signed)
[name] Procedure Date: 05/15/2020 10:13 AM MRN: [id number] Endoscopist: Milus Banister , MD Age: 69 Referring MD:  Date of Birth: 1951-05-05 Gender: Male Account #: [id number] Procedure:                Colonoscopy Indications:              Clinically significant diarrhea of unexplained                            origin, also history of polyps: colonoscopy                            08/01/2018, 12 polyps were removed. 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 Medicines:                Monitored Anesthesia Care Procedure:                Pre-Anesthesia Assessment:                           - Prior to the procedure, a History and Physical                            was performed, and patient medications and                            allergies were reviewed. The patient's tolerance of                            previous anesthesia was also reviewed. The risks                            and benefits of the procedure and the sedation                            options and risks were discussed with the patient.                            All questions were answered, and informed consent                            was obtained. Prior Anticoagulants: The patient has                            taken Plavix (clopidogrel), last dose was 5 days                            prior to procedure. ASA Grade Assessment: III - A  patient with severe systemic disease. After                            reviewing the risks and benefits, the patient was                            deemed in satisfactory condition to undergo the                            procedure.                           After obtaining informed consent, the colonoscope                            was passed under direct vision. Throughout the                             procedure, the patient's blood pressure, pulse, and                            oxygen saturations were monitored continuously. The                            Olympus CF-HQ190 (#[id number]) Colonoscope was                            introduced through the anus and advanced to the the                            terminal ileum. The colonoscopy was performed                            without difficulty. The patient tolerated the                            procedure well. The quality of the bowel                            preparation was good. The terminal ileum, ileocecal                            valve, appendiceal orifice, and rectum were                            photographed. Scope In: 10:43:47 AM Scope Out: 10:58:38 AM Scope Withdrawal Time: 0 hours 10 minutes 36 seconds  Total Procedure Duration: 0 hours 14 minutes 51 seconds  Findings:                 The terminal ileum appeared normal.                           The site of 2020 hepatic flexure polypectomy was  easily located by very evident spot tattoo and it                            was normal. No recurrent or residual polypoid                            mucosa.                           A 5 mm polyp was found in the descending colon. The                            polyp was sessile. The polyp was removed with a                            cold snare. Resection and retrieval were complete.                            jar 2                           Biopsies for histology were taken with a cold                            forceps from the entire colon for evaluation of                            microscopic colitis. jar 1                           Multiple small and large-mouthed diverticula were                            found in the left colon.                           Internal hemorrhoids were found. The hemorrhoids                            were small.                           The exam was  otherwise without abnormality on                            direct and retroflexion views. Complications:            No immediate complications. Estimated blood loss:                            None. Estimated Blood Loss:     Estimated blood loss: none. Impression:               - The examined portion of the ileum was normal.                           -  The site of 2020 hepatic flexure polypectomy was                            easily located by very evident spot tattoo and it                            was normal. No recurrent or residual polypoid                            mucosa.                           - One 5 mm polyp in the descending colon, removed                            with a cold snare. Resected and retrieved.                           - Diverticulosis in the left colon.                           - Internal hemorrhoids.                           - The examination was otherwise normal on direct                            and retroflexion views.                           - Biopsies were taken with a cold forceps from the                            entire colon for evaluation of microscopic colitis. Recommendation:           - Patient has a contact number available for                            emergencies. The signs and symptoms of potential                            delayed complications were discussed with the                            patient. Return to normal activities tomorrow.                            Written discharge instructions were provided to the                            patient.                           - Resume previous diet.                           -  Continue present medications. It is OK to resume                            your plavix tomorrow.                           - Await pathology results. Milus Banister, MD 05/15/2020 11:04:28 AM This report has been signed electronically.

## 2020-05-19 ENCOUNTER — Telehealth: Payer: Self-pay | Admitting: *Deleted

## 2020-05-19 NOTE — Telephone Encounter (Signed)
1. Have you developed a fever since your procedure? no  2.   Have you had an respiratory symptoms (SOB or cough) since your procedure? no  3.   Have you tested positive for COVID 19 since your procedure no 4.   Have you had any family members/close contacts diagnosed with the COVID 19 since your procedure? no   If yes to any of these questions please route to Joylene John, RN and Joella Prince, RN Follow up Call-  Call back number 05/15/2020 08/01/2018  Post procedure Call Back phone  # [phone]  Permission to leave phone message Yes Yes  Some recent data might be hidden     Patient questions:  Do you have a fever, pain , or abdominal swelling? No. Pain Score  0 *  Have you tolerated food without any problems? Yes.    Have you been able to return to your normal activities? Yes.    Do you have any questions about your discharge instructions: Diet   No. Medications  No. Follow up visit  No.  Do you have questions or concerns about your Care? No.  Actions: * If pain score is 4 or above: No action needed, pain <4.

## 2020-05-26 ENCOUNTER — Other Ambulatory Visit: Payer: Self-pay

## 2020-05-26 DIAGNOSIS — R932 Abnormal findings on diagnostic imaging of liver and biliary tract: Secondary | ICD-10-CM

## 2020-05-27 DIAGNOSIS — Z20822 Contact with and (suspected) exposure to covid-19: Secondary | ICD-10-CM | POA: Diagnosis not present

## 2020-05-27 DIAGNOSIS — J069 Acute upper respiratory infection, unspecified: Secondary | ICD-10-CM | POA: Diagnosis not present

## 2020-06-02 ENCOUNTER — Other Ambulatory Visit (INDEPENDENT_AMBULATORY_CARE_PROVIDER_SITE_OTHER): Payer: Medicare HMO

## 2020-06-02 DIAGNOSIS — R932 Abnormal findings on diagnostic imaging of liver and biliary tract: Secondary | ICD-10-CM

## 2020-06-02 LAB — COMPREHENSIVE METABOLIC PANEL
ALT: 29 U/L (ref 0–53)
AST: 30 U/L (ref 0–37)
Albumin: 4.7 g/dL (ref 3.5–5.2)
Alkaline Phosphatase: 68 U/L (ref 39–117)
BUN: 16 mg/dL (ref 6–23)
CO2: 26 mEq/L (ref 19–32)
Calcium: 9.8 mg/dL (ref 8.4–10.5)
Chloride: 106 mEq/L (ref 96–112)
Creatinine, Ser: 1.05 mg/dL (ref 0.40–1.50)
GFR: 72.1 mL/min (ref >60.00)
Glucose, Bld: 95 mg/dL (ref 70–99)
Potassium: 4 mEq/L (ref 3.5–5.1)
Sodium: 141 mEq/L (ref 135–145)
Total Bilirubin: 0.8 mg/dL (ref 0.2–1.2)
Total Protein: 7.4 g/dL (ref 6.0–8.3)

## 2020-06-02 LAB — CBC WITH DIFFERENTIAL/PLATELET
Basophils Absolute: 0.1 10*3/uL (ref 0.0–0.1)
Basophils Relative: 1.2 % (ref 0.0–3.0)
Eosinophils Absolute: 0.2 10*3/uL (ref 0.0–0.7)
Eosinophils Relative: 3 % (ref 0.0–5.0)
HCT: 40.3 % (ref 39.0–52.0)
Hemoglobin: 13.7 g/dL (ref 13.0–17.0)
Lymphocytes Relative: 32.1 % (ref 12.0–46.0)
Lymphs Abs: 2.2 10*3/uL (ref 0.7–4.0)
MCHC: 33.9 g/dL (ref 30.0–36.0)
MCV: 95.6 fl (ref 78.0–100.0)
Monocytes Absolute: 0.6 10*3/uL (ref 0.1–1.0)
Monocytes Relative: 9.4 % (ref 3.0–12.0)
Neutro Abs: 3.7 10*3/uL (ref 1.4–7.7)
Neutrophils Relative %: 54.3 % (ref 43.0–77.0)
Platelets: 98 10*3/uL — ABNORMAL LOW (ref 150.0–400.0)
RBC: 4.21 Mil/uL — ABNORMAL LOW (ref 4.22–5.81)
RDW: 14.1 % (ref 11.5–15.5)
WBC: 6.8 10*3/uL (ref 4.0–10.5)

## 2020-06-02 LAB — PROTIME-INR
INR: 1.3 ratio — ABNORMAL HIGH (ref 0.8–1.0)
Prothrombin Time: 14.4 s — ABNORMAL HIGH (ref 9.6–13.1)

## 2020-06-08 ENCOUNTER — Ambulatory Visit (HOSPITAL_COMMUNITY)
Admission: RE | Admit: 2020-06-08 | Discharge: 2020-06-08 | Disposition: A | Discharge status: Home / Self Care | Payer: Medicare HMO | Source: Ambulatory Visit | Attending: Gastroenterology | Admitting: Gastroenterology

## 2020-06-08 ENCOUNTER — Other Ambulatory Visit: Payer: Self-pay

## 2020-06-08 DIAGNOSIS — R161 Splenomegaly, not elsewhere classified: Secondary | ICD-10-CM | POA: Diagnosis not present

## 2020-06-08 DIAGNOSIS — R932 Abnormal findings on diagnostic imaging of liver and biliary tract: Secondary | ICD-10-CM | POA: Insufficient documentation

## 2020-06-08 DIAGNOSIS — N200 Calculus of kidney: Secondary | ICD-10-CM | POA: Diagnosis not present

## 2020-07-10 DIAGNOSIS — N201 Calculus of ureter: Secondary | ICD-10-CM | POA: Diagnosis not present

## 2020-07-10 DIAGNOSIS — N202 Calculus of kidney with calculus of ureter: Secondary | ICD-10-CM | POA: Diagnosis not present

## 2020-07-10 DIAGNOSIS — R310 Gross hematuria: Secondary | ICD-10-CM | POA: Diagnosis not present

## 2020-07-13 ENCOUNTER — Telehealth: Payer: Self-pay | Admitting: Cardiology

## 2020-07-13 NOTE — Telephone Encounter (Signed)
   [facility] Pre-operative Risk Assessment    HEARTCARE STAFF: - Please ensure there is not already an duplicate clearance open for this procedure. - Under Visit Info/Reason for Call, type in Other and utilize the format Clearance MM/DD/YY or Clearance TBD. Do not use dashes or single digits. - If request is for dental extraction, please clarify the # of teeth to be extracted.  Request for surgical clearance:  1. What type of surgery is being performed?   Cystoscopy Bilateral retrograde pyelogram , bilateral ureteroscopy and stent placement  2. When is this surgery scheduled?  As soon as he is cleared   3. What type of clearance is required (medical clearance vs. Pharmacy clearance to hold med vs. Both)?  both  4. Are there any medications that need to be held prior to surgery and how long? Plavix and aspirin - held 5 day prior to procedure   5. Practice name and name of physician performing surgery?  [name]   6. What is the office phone number? [phone]   7.   What is the office fax number?  [phone]  8.   Anesthesia type (None, local, MAC, general) ? General    Jannet Askew 07/13/2020, 10:54 AM  _________________________________________________________________   (provider comments below)

## 2020-07-14 NOTE — Telephone Encounter (Signed)
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 Notes indicate he is committed to indef DAPT.  Will fwd to Dr. Harl Bowie to get input re: holding ASA and Plavix for bladder procedure.  Richardson Dopp, PA-C    07/14/2020 1:29 PM

## 2020-07-16 NOTE — Telephone Encounter (Signed)
Ok to hold aspirin and plavix if needed for procedure. Typically hold plavix 5 days and aspirin 7 days  Zandra Abts MD

## 2020-07-17 NOTE — Telephone Encounter (Signed)
   Primary Cardiologist: Carlyle Dolly, MD  Chart reviewed as part of pre-operative protocol coverage. Patient was contacted 07/17/2020 in reference to pre-operative risk assessment for pending surgery as outlined below.  Hayden Rivera was last seen on 05/05/20 by Dr. Harl Bowie.   Left VM requesting call back. MyChart message sent as well.   Loel Dubonnet, NP 07/17/2020, 10:33 AM

## 2020-07-20 ENCOUNTER — Ambulatory Visit: Payer: Medicare HMO | Admitting: Gastroenterology

## 2020-07-20 ENCOUNTER — Other Ambulatory Visit: Payer: Self-pay

## 2020-07-20 ENCOUNTER — Other Ambulatory Visit (INDEPENDENT_AMBULATORY_CARE_PROVIDER_SITE_OTHER): Payer: Medicare HMO

## 2020-07-20 ENCOUNTER — Encounter: Payer: Self-pay | Admitting: Gastroenterology

## 2020-07-20 VITALS — BP 120/70 | HR 55 | Ht 71.0 in | Wt 228.2 lb

## 2020-07-20 DIAGNOSIS — K219 Gastro-esophageal reflux disease without esophagitis: Secondary | ICD-10-CM

## 2020-07-20 DIAGNOSIS — Z86010 Personal history of colonic polyps: Secondary | ICD-10-CM

## 2020-07-20 DIAGNOSIS — K7460 Unspecified cirrhosis of liver: Secondary | ICD-10-CM

## 2020-07-20 LAB — CBC WITH DIFFERENTIAL/PLATELET
Basophils Absolute: 0.1 10*3/uL (ref 0.0–0.1)
Basophils Relative: 1 % (ref 0.0–3.0)
Eosinophils Absolute: 0.2 10*3/uL (ref 0.0–0.7)
Eosinophils Relative: 2.8 % (ref 0.0–5.0)
HCT: 41.8 % (ref 39.0–52.0)
Hemoglobin: 14.3 g/dL (ref 13.0–17.0)
Lymphocytes Relative: 27 % (ref 12.0–46.0)
Lymphs Abs: 1.9 10*3/uL (ref 0.7–4.0)
MCHC: 34.4 g/dL (ref 30.0–36.0)
MCV: 95 fl (ref 78.0–100.0)
Monocytes Absolute: 0.7 10*3/uL (ref 0.1–1.0)
Monocytes Relative: 10.2 % (ref 3.0–12.0)
Neutro Abs: 4.2 10*3/uL (ref 1.4–7.7)
Neutrophils Relative %: 59 % (ref 43.0–77.0)
Platelets: 109 10*3/uL — ABNORMAL LOW (ref 150.0–400.0)
RBC: 4.4 Mil/uL (ref 4.22–5.81)
RDW: 13.7 % (ref 11.5–15.5)
WBC: 7.1 10*3/uL (ref 4.0–10.5)

## 2020-07-20 LAB — PROTIME-INR
INR: 1.3 ratio — ABNORMAL HIGH (ref 0.8–1.0)
Prothrombin Time: 14 s — ABNORMAL HIGH (ref 9.6–13.1)

## 2020-07-20 LAB — COMPREHENSIVE METABOLIC PANEL
ALT: 21 U/L (ref 0–53)
AST: 22 U/L (ref 0–37)
Albumin: 4.5 g/dL (ref 3.5–5.2)
Alkaline Phosphatase: 69 U/L (ref 39–117)
BUN: 15 mg/dL (ref 6–23)
CO2: 24 mEq/L (ref 19–32)
Calcium: 9.2 mg/dL (ref 8.4–10.5)
Chloride: 107 mEq/L (ref 96–112)
Creatinine, Ser: 1.04 mg/dL (ref 0.40–1.50)
GFR: 72.86 mL/min (ref >60.00)
Glucose, Bld: 104 mg/dL — ABNORMAL HIGH (ref 70–99)
Potassium: 3.7 mEq/L (ref 3.5–5.1)
Sodium: 141 mEq/L (ref 135–145)
Total Bilirubin: 0.8 mg/dL (ref 0.2–1.2)
Total Protein: 7.5 g/dL (ref 6.0–8.3)

## 2020-07-20 LAB — IBC + FERRITIN
Ferritin: 40.6 ng/mL (ref 22.0–322.0)
Iron: 60 ug/dL (ref 42–165)
Saturation Ratios: 14.6 % — ABNORMAL LOW (ref 20.0–50.0)
Transferrin: 293 mg/dL (ref 212.0–360.0)

## 2020-07-20 MED ORDER — FAMOTIDINE 20 MG PO TABS: 20.0000 mg | ORAL_TABLET | Freq: Every day | ORAL | 3 refills | Status: DC

## 2020-07-20 NOTE — Progress Notes (Signed)
Review of pertinent gastrointestinal problems: 1.  History of precancerous colon polyps.  Colonoscopy March 2000 2012 polyps were removed, one of the polyps was a 20 mm tubular adenoma at the athletic flexure which was removed and tattooed.  Repeat colonoscopy December 2021 the site of 2020 hepatic flexure polypectomy was normal-appearing. Random colon biopsies were taken because of chronic diarrhea.  These were normal.  His terminal ileum was normal. 5 mm polyp was removed and it was not precancerous.  Recall should be December 2026.  2.  Months of loose stools eventually led to colonoscopy.  Sed rate normal, GI pathogen panel negative.  Colonoscopy as above December 2021.  His symptoms improved with Imodium on a scheduled basis. 3.  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  December 2021 "lobular hepatic contours and borderline splenomegaly"  Ultrasound January 2022 nodular liver contour, no focal hepatic lesions, positive splenomegaly.  MELD-Na score 10 (05/2020 labs)    HPI: This is a very pleasant 70 year old man who is here with his wife today.  I last saw him in the time of a colonoscopy.  See those results summarized above.  We discussed his personal history of colon polyps.  His chronic loose stools have completely resolved since he briefly took Imodium on a scheduled basis.  He moves his bowels once or twice daily, solid, nonbloody.  We spent the bulk of his visit talking about his incidental cirrhosis.  He was never a big alcohol drinker, cirrhosis does not run in his family.  He was told many years ago that he might have fatty liver based on an ultrasound.  He spent 2 years in the [employer] serving in [place] during the [place] conflict.  He has never had jaundice, he has never had hepatitis.  He has intentionally lost about 30 pounds in the past 6 months to try to get off of Glucophage.  No encephalopathy, no overt GI bleeding.  No trouble with edema.  He has a bothersome cough  that he thinks is related to sinus drainage which she feels coming down from his sinuses.  This is worse at bedtime.  He does have mild pyrosis.  No dysphagia.   ROS: complete GI ROS as described in HPI, all other review negative.  Constitutional:  No unintentional weight loss   Past Medical History:  Diagnosis Date  . Anginal pain ([place])    occ  . Aortic stenosis 06/16/2017   Mild, noted on ECHO  . Arthritis   . Atrial fibrillation ([place]) 02/2016   post op  . CAD (coronary artery disease)    a. Anterior STEMI 2005 c/b vfib arrest, PCI to LAD at [facility]. b. Stent thrombosis 2006 with DES within prior LAD stent at [facility]. C. 09/2012: s/p balloon angioplasty to mLAD for severe stenosis in previously stented segment & DES to LCx; initial enz neg but ruled in for NSTEMI after post-cath vagal sx, felt d/t distal emboliz of thrombus during case.  . Cancer ([place])    skin ca melanoma, pt states was removed from back  . Chronic back pain   . Dependent edema    Mild, occ NONE RECENT  . Diarrhea   . DM type 2 (diabetes mellitus, type 2) ([place])   . Elevated hemidiaphragm    a. Noted 09/2012 - instructed to f/u PCP.  Marland Kitchen Heart murmur   . History of kidney stones   . Hyperlipidemia   . Hypertension   . Ischemic cardiomyopathy    a.  Unclear prior EF but pt was told heart was weakened in past. b. EF normal 09/2012.  Marland Kitchen Myocardial infarction ([place]) 2005   TOTAL MI'S 4 LAST ONE 2017  . Retinal tear, right   . Ventricular fibrillation ([place])    a. VF arrest 2005 in setting of STEMI.  . Wears glasses     Past Surgical History:  Procedure Laterality Date  . BACK SURGERY  1997   UPPER CERVICAL C 3 TO C 4 FUSION  . CARDIAC CATHETERIZATION     stents x2  . CARDIAC CATHETERIZATION N/A 10/02/2014   Procedure: Left Heart Cath And Coronary Angiography;  Surgeon: Larey Dresser, MD;  Location: [facility] INVASIVE CV LAB CUPID;  Service: Cardiovascular;  Laterality: N/A;  . CARDIAC CATHETERIZATION N/A 02/17/2016    Procedure: Left Heart Cath and Coronary Angiography;  Surgeon: Larey Dresser, MD;  Location: [place] CV LAB;  Service: Cardiovascular;  Laterality: N/A;  . CORONARY ARTERY BYPASS GRAFT N/A 03/01/2016   Procedure: CORONARY ARTERY BYPASS GRAFTING (CABG)x3 with endoscopic harvesting of right saphenous vein -LIMA to LAD -SVG to LEFT CIRCUMFLEX -RADIAL ARTERY to RAMUS INTERMEDIA;  Surgeon: Grace Isaac, MD;  Location: [facility];  Service: Open Heart Surgery;  Laterality: N/A;  . CORONARY STENT PLACEMENT  2005 AND REAPLCED 2006, 2014  . CYSTOSCOPY/URETEROSCOPY/HOLMIUM LASER/STENT PLACEMENT Bilateral 05/29/2018   Procedure: CYSTOSCOPY/RETROGRADE/URETEROSCOPY/HOLMIUM LASER/STENT PLACEMENT/ BASKET STONE EXTRACTION;  Surgeon: [name], MD;  Location: [facility];  Service: Urology;  Laterality: Bilateral;  . EYE EXAMINATION UNDER ANESTHESIA W/ RETINAL CRYOTHERAPY AND RETINAL LASER Right 2018  . HAND SURGERY Left 1990'S   hamick bone rem  . KNEE ARTHROSCOPY Right 10/24/2014   Procedure: ARTHROSCOPY RIGHT KNEE, Partial medial menisectomy and chondroplasty;  Surgeon: Melrose Nakayama, MD;  Location: [place];  Service: Orthopedics;  Laterality: Right;  . LEFT HEART CATH N/A 10/11/2012   Procedure: LEFT HEART CATH;  Surgeon: Sherren Mocha, MD;  Location: [facility] CATH LAB;  Service: Cardiovascular;  Laterality: N/A;  . LITHOTRIPSY  EARLY 2000'S   X 2 OR 3  . RADIAL ARTERY HARVEST Left 03/01/2016   Procedure: LEFT RADIAL ARTERY HARVEST;  Surgeon: Grace Isaac, MD;  Location: [place];  Service: Open Heart Surgery;  Laterality: Left;  . SPLIT NIGHT STUDY  09/21/2015  . TEE WITHOUT CARDIOVERSION N/A 03/01/2016   Procedure: TRANSESOPHAGEAL ECHOCARDIOGRAM (TEE);  Surgeon: Grace Isaac, MD;  Location: [facility];  Service: Open Heart Surgery;  Laterality: N/A;  . TONSILLECTOMY  AS CHILD    Current Outpatient Medications  Medication Sig Dispense Refill  . acetaminophen (TYLENOL) 500 MG tablet  Take 500 mg by mouth every 6 (six) hours as needed (PAIN).     Marland Kitchen aspirin 81 MG tablet Take 1 tablet (81 mg total) by mouth daily.    . cetirizine (ZYRTEC) 10 MG tablet Take 10 mg by mouth daily as needed.     . clopidogrel (PLAVIX) 75 MG tablet Take 1 tablet (75 mg total) by mouth daily. 90 tablet 2  . finasteride (PROSCAR) 5 MG tablet TAKES 1/4 TABLET IN PM    . isosorbide mononitrate (IMDUR) 30 MG 24 hr tablet TAKE 1 TABLET EVERY DAY 90 tablet 1  . meclizine (ANTIVERT) 12.5 MG tablet Take 1 tablet (12.5 mg total) by mouth 3 (three) times daily as needed for dizziness. 30 tablet 0  . metFORMIN (GLUCOPHAGE) 500 MG tablet Take 500 mg by mouth at bedtime.    . metoprolol succinate (TOPROL-XL) 25  MG 24 hr tablet TAKE 1/2 TABLET EVERY DAY (Patient taking differently: every evening.) 45 tablet 3  . rosuvastatin (CRESTOR) 20 MG tablet TAKE 1 TABLET EVERY DAY 90 tablet 3  . TURMERIC PO Take 2 capsules by mouth daily.    Marland Kitchen UNABLE TO FIND Med Name: IMMODIUM PRN     Current Facility-Administered Medications  Medication Dose Route Frequency Provider Last Rate Last Admin  . 0.9 %  sodium chloride infusion  500 mL Intravenous Once Milus Banister, MD        Allergies as of 07/20/2020 - Review Complete 07/20/2020  Allergen Reaction Noted  . Altace [ramipril] Shortness Of Breath and Other (See Comments) 10/13/2012  . Penicillins Swelling 12/29/2010    Family History  Problem Relation Age of Onset  . Heart disease Mother   . Stroke Father   . Healthy Brother   . Colon cancer Neg Hx   . Esophageal cancer Neg Hx   . Rectal cancer Neg Hx   . Stomach cancer Neg Hx     Social History   Socioeconomic History  . Marital status: Married    Spouse name: Not on file  . Number of children: Not on file  . Years of education: Not on file  . Highest education level: Not on file  Occupational History  . Occupation: retired  Tobacco Use  . Smoking status: Former Smoker    Packs/day: 0.50    Years:  10.00    Pack years: 5.00    Types: Cigars, Cigarettes    Quit date: 05/30/2002    Years since quitting: 18.1  . Smokeless tobacco: Never Used  Vaping Use  . Vaping Use: Never used  Substance and Sexual Activity  . Alcohol use: Not Currently    Comment: occ BEER  . Drug use: No  . Sexual activity: Not Currently  Other Topics Concern  . Not on file  Social History Narrative  . Not on file   Social Determinants of Health   Financial Resource Strain: Not on file  Food Insecurity: Not on file  Transportation Needs: Not on file  Physical Activity: Not on file  Stress: Not on file  Social Connections: Not on file  Intimate Partner Violence: Not on file     Physical Exam: Ht 5\' 11"  (1.803 m)   Wt 228 lb 4 oz (103.5 kg)   BMI 31.83 kg/m  Constitutional: generally well-appearing Psychiatric: alert and oriented x3 Abdomen: soft, nontender, nondistended, no obvious ascites, no peritoneal signs, normal bowel sounds No peripheral edema noted in lower extremities  Assessment and plan: 70 y.o. male with personal history of precancerous colon polyps, GERD, cough, incidental cirrhosis  First he needs repeat colonoscopy at 5-year interval from his January 2021 colonoscopy.  We will put him in our reminder system for that.  His chronic diarrhea is completely resolved since a brief course of Imodium.  Second he has mild pyrosis, bothersome cough which does sound at least partly related to sinus drainage which he feels coming down from above especially when he lays down.  I did explain that sometimes acid can play a role in coughs like this and I recommended he start taking a famotidine 20 mg pill 1 pill at bedtime every night for now.  Third he has well compensated incidental cirrhosis.  Unclear etiology.  We are going to do a battery of blood work today to try to determine exactly what has caused his cirrhosis but certainly could be from fatty  liver disease.  We will arrange EGD at his  soonest convenience for varices screening.  He does not need to hold his blood thinner.  Return office visit in 6 months.  Please see the "Patient Instructions" section for addition details about the plan.  Owens Loffler, MD [facility] 07/20/2020, 8:54 AM   Total time on date of encounter was 34minutes (this included time spent preparing to see the patient reviewing records; obtaining and/or reviewing separately obtained history; performing a medically appropriate exam and/or evaluation; counseling and educating the patient and family if present; ordering medications, tests or procedures if applicable; and documenting clinical information in the health record).

## 2020-07-20 NOTE — Telephone Encounter (Signed)
   Primary Cardiologist: Carlyle Dolly, MD  Chart reviewed as part of pre-operative protocol coverage. Called [name] 07/20/20 at 2:55PM with no answer, left VM requesting callback. This is call #2.  Loel Dubonnet, NP 07/20/2020, 2:56 PM

## 2020-07-20 NOTE — Patient Instructions (Addendum)
You will get labs drawn today:  Hepatitis A (IgM and IgG), Hepatitis B surface antigen, Hepatitis B surface antibody, Hepatitis C antibody, total iron, ferritin, TIBC, ANA, AMA, alphafeto protein (AFP), anti smooth muscle antibody,  CBC, CMET, INR.   Your provider has requested that you go to the basement level for lab work before leaving today. Press "B" on the elevator. The lab is located at the first door on the left as you exit the elevator.  You have been scheduled for an endoscopy. Please follow written instructions given to you at your visit today. If you use inhalers (even only as needed), please bring them with you on the day of your procedure.   Take Pepcid 20 mg every night at bedtime  Your colonoscopy recall is January 2026  Follow up in 6 months   Due to recent changes in healthcare laws, you may see the results of your imaging and laboratory studies on MyChart before your provider has had a chance to review them.  We understand that in some cases there may be results that are confusing or concerning to you. Not all laboratory results come back in the same time frame and the provider may be waiting for multiple results in order to interpret others.  Please give [place] 48 hours in order for your provider to thoroughly review all the results before contacting the office for clarification of your results.  Thank you for choosing [facility]  [place]hela [place]

## 2020-07-20 NOTE — Telephone Encounter (Signed)
   Primary Cardiologist: Carlyle Dolly, MD  Chart reviewed as part of pre-operative protocol coverage. Patient was contacted 07/20/2020 in reference to pre-operative risk assessment for pending surgery as outlined below.  Hayden Rivera was last seen on 05/05/20 by Dr. Harl Bowie.  Since that day, Hayden Rivera has done well.  Therefore, based on ACC/AHA guidelines, the patient would be at acceptable risk for the planned procedure without further cardiovascular testing.   Per Dr. Nelly Laurence review he may hold Aspirin 7 days prior to the planned procedure and Plavix 5 days prior. Resume as soon as safe from surgical perspective.   The patient was advised that if he develops new symptoms prior to surgery to contact our office to arrange for a follow-up visit, and he verbalized understanding.  I will route this recommendation to the requesting party via Epic fax function and remove from pre-op pool. Please call with questions.  Loel Dubonnet, NP 07/20/2020, 4:13 PM

## 2020-07-21 LAB — IRON AND TIBC
Iron Saturation: 16 % (ref 15–55)
Iron: 58 ug/dL (ref 38–169)
Total Iron Binding Capacity: 352 ug/dL (ref 250–450)
UIBC: 294 ug/dL (ref 111–343)

## 2020-07-23 ENCOUNTER — Encounter: Payer: Self-pay | Admitting: Gastroenterology

## 2020-07-23 ENCOUNTER — Other Ambulatory Visit: Payer: Self-pay | Admitting: Urology

## 2020-07-23 LAB — AFP TUMOR MARKER: AFP-Tumor Marker: 2.9 ng/mL (ref <6.1)

## 2020-07-23 LAB — MITOCHONDRIAL ANTIBODIES: Mitochondrial M2 Ab, IgG: <=20 U

## 2020-07-23 LAB — HEPATITIS C ANTIBODY
Hepatitis C Ab: NONREACTIVE
SIGNAL TO CUT-OFF: 0.16 (ref <1.00)

## 2020-07-23 LAB — ANTI-SMOOTH MUSCLE ANTIBODY, IGG: Actin (Smooth Muscle) Antibody (IGG): <20 U (ref <20)

## 2020-07-23 LAB — IGG: IgG (Immunoglobin G), Serum: 1060 mg/dL (ref 600–1540)

## 2020-07-23 LAB — ANA: Anti Nuclear Antibody (ANA): NEGATIVE

## 2020-07-23 LAB — HEPATITIS B SURFACE ANTIGEN: Hepatitis B Surface Ag: NONREACTIVE

## 2020-07-23 LAB — HEPATITIS A ANTIBODY, TOTAL: Hepatitis A AB,Total: NONREACTIVE

## 2020-07-23 LAB — HEPATITIS B SURFACE ANTIBODY,QUALITATIVE: Hep B S Ab: NONREACTIVE

## 2020-08-04 ENCOUNTER — Encounter: Payer: Medicare HMO | Admitting: Gastroenterology

## 2020-08-05 ENCOUNTER — Other Ambulatory Visit (HOSPITAL_COMMUNITY)
Admission: RE | Admit: 2020-08-05 | Discharge: 2020-08-05 | Disposition: A | Discharge status: Home / Self Care | Payer: Medicare HMO | Source: Ambulatory Visit | Attending: Urology | Admitting: Urology

## 2020-08-05 ENCOUNTER — Encounter (HOSPITAL_BASED_OUTPATIENT_CLINIC_OR_DEPARTMENT_OTHER): Payer: Self-pay | Admitting: Urology

## 2020-08-05 ENCOUNTER — Other Ambulatory Visit: Payer: Self-pay

## 2020-08-05 DIAGNOSIS — Z01812 Encounter for preprocedural laboratory examination: Secondary | ICD-10-CM | POA: Insufficient documentation

## 2020-08-05 DIAGNOSIS — Z20822 Contact with and (suspected) exposure to covid-19: Secondary | ICD-10-CM | POA: Insufficient documentation

## 2020-08-05 LAB — SARS CORONAVIRUS 2 (TAT 6-24 HRS): SARS Coronavirus 2: NEGATIVE

## 2020-08-05 NOTE — Progress Notes (Signed)
Spoke w/ via phone for pre-op interview---pt and wife judy Lab needs dos----   I stat              Lab results------see below COVID test ------08-05-2020 1100 Arrive at -------1115 am 08-07-2020 NPO after MN NO Solid Food.  Clear liquids from MN until---1015 am then npo Medications to take morning of surgery ----imdur, zyrtec, imodium prn, - Diabetic medication -----diet controlled Patient Special Instructions -----none Pre-Op special Istructions -----none Patient verbalized understanding of instructions that were given at this phone interview. Patient denies shortness of breath, chest pain, fever, cough at this phone interview.    PCP: Harmon Pier fnp Cardiologist :dr branch Glynda Jaeger 10-21-2019 epic Chest x-ray :none EKG :10-21-2019 epic Echo :06-16-2017 epic Stress test:none Cardiac Cath : 02-17-2016 epic Activity level: can climb stairs and do household chores without difficulty  Study/ CPAP :none Fasting Blood Sugar :    114  / Checks Blood Sugar -qod running 114:   Blood Thinner/ Instructions /Last Dose:note to stop aspirin 7 days and plavix 5 days prior to surgery and cardiac clearance note caitlin walker np 07-20-2020 epic/and on chart

## 2020-08-06 NOTE — H&P (Signed)
Office Visit Report     07/10/2020   --------------------------------------------------------------------------------   Hayden Rivera  MRN: [id number]  DOB: 1951-03-25, 70 year old Male  SSN: [id number]   PRIMARY CARE:  Chipper Herb, MD  REFERRING:  Stevan Born, NP  PROVIDER:  [name], M.D.  TREATING:  Jiles Crocker, NP  LOCATION:  [facility] [phone]     --------------------------------------------------------------------------------   CC/HPI: F/u -   1) kidney stones - KUB with stable RLP and LUP / LMP stones tx 12/19 with bilateral ureteroscopy with laser lithotripsy, he pulled stents. F/u US benign. CT 02/21 revealed bilateral stones (6 mm RUP, 5 mm left renal pelvic stone). [place] 07/21 no hydro. KUB done 02/29/2020 for abd pain with possible 9 mm right distal stone. 5 mm left pelvis.   2) hematuria - eval in 2018 with cysto and [place]. He had gross hematuria Jan 2021. F/u KUB with faint 5 mm bilateral stones. Ua with greater than 60 rbcs. He had some left flank pain but hasn't seen a stone pass. He noted red urine especially p yard work/mowing. He noted "quite a bit of blood" the other day when he voided. [place] 07/21 and cystoscopy benign. No hydro. Small stones, cysts.   Today, pt is seen for the above. He has a weak stream. Passing some blood. He has been having diarrhea and pain from left side and also on right. He tried Cipro. He hasn't had dysuria. No fever or chills. He tried tamsulosin. No stone passage. UA today with moderate bacteria. His temp is 100.4.   04/15/20: 70 year old male with the above noted past medical history who presents today for follow-up KUB and ultrasound for a right distal nonobstructing 10 mm stone as well as a left renal pelvis stone that was nonobstructing. He denies passage of any stone material. He does report some intermittent left flank discomfort at times. His renal ultrasound does not show any concern for obstruction today.  He is currently dealing with colitis and has not been feeling well. Urinalysis is concerning for red blood cells. He denies any difficulty voiding, dysuria, fevers, chills.   07/10/2020: Patient returns today for follow-up exam. Prior CT imaging indicated bilateral stone burden with largest stone noted in the left renal pelvis just proximal to the UPJ, also identified 3 nonobstructing calculi on the right. There was also a nonobstructing 8 mm stone in the distal right ureter. He was initially set up for bilateral ureteroscopy but due to the nonobstructive pattern noted on the in patient with acute exacerbation of colitis, procedure was defer until his GI symptoms were further addressed. He eventually did undergo colonoscopy worsen pre cancers polyps removed. He has been evaluated by GI extensively in treated appropriately with antimicrobial therapy and supportive treatments. There is noted to be some underlying concern for mild liver disease. Fortunately his symptoms have improved patient longer having significant abdominal pain, nausea/vomiting, constipation or diarrhea.   Since last office visit he denies interval stone material passage. He is still having some occasional left-sided lower back and flank pain. He continues to pass intermittent hematuria. Also associated with some mild intermittent burning. Voiding symptoms otherwise are grossly stable. He is continuing tamsulosin daily which he feels does help stabilize and make more tolerable baseline lower urinary tract symptoms and also prevent him exacerbation of renal colic. Patient underwent an abdominal ultrasound as part of follow-up from GI which noted no obvious obstructive signs within each kidney.  ALLERGIES: Penicillins    MEDICATIONS: Crestor  Metformin Hcl  Metoprolol Tartrate 25 mg tablet  Tamsulosin Hcl 0.4 mg capsule 1 capsule PO Daily  Zyrtec  Aspirin Ec 81 mg tablet, delayed release Oral  Clopidogrel 75 mg tablet Oral   Isosorbide  Losartan Potassium 50 mg tablet Oral  Turmeric  Vitamin D-3 TABS Oral     GU PSH: Cystoscopy - 12/24/2019, 2018 Ureteroscopic laser litho, Bilateral - 05/29/2018       PSH Notes: Kidney Surgery, Heart Surgery, Back Surgery   NON-GU PSH: Coronary Artery Bypass Grafting Eye Surgery (Unspecified)     GU PMH: Renal calculus - 04/15/2020, Given the left renal pelvic stone and the right distal ureteral stone we went over the nature risk benefits of continued surveillance stone passage, ureteroscopy which I would recommend prestanding, ESWL and PCNL. All questions answered. I will set him up for ureteral stents tomorrow and sent urine for culture. We will start him on doxycycline and then arrange for ureteroscopy in 2 or 3 weeks. He had some stent pain last time hopefully that will be better with tamsulosin. All questions answered., - 03/25/2020, - 12/24/2019, Will follow-up with a renal US. Start tamsulosin. , - 10/14/2019, Patient has bilateral nonobstructing calculi with a 7 x 9.7 mm calculus identified in the left renal pelvis. No obvious ureteral calculi noted on today's exam with final radiological interpretation pending. Renal pelvis stone not well seen on last KUB or scout imaging today. I have high suspicion this is the source of his continued microscopic hematuria., - 07/23/2019, - 06/28/2019, - 2020, - 2020, - 05/03/2018, - 03/06/2018, - 01/22/2018, - 2018, - 2018, Nephrolithiasis, - 2016 Ureteral calculus - 04/15/2020, - 03/25/2020 Gross hematuria - 03/25/2020, (Stable), will check cystoscopy to complete eval. , - 10/14/2019, - 05/03/2018 ED due to arterial insufficiency - 01/22/2018, - 2018, - 2018, Erectile dysfunction due to arterial insufficiency, - 2016 Microscopic hematuria - 2018 Other microscopic hematuria, Microscopic hematuria - 2016 Urinary Urgency, Urinary urgency - 2016    NON-GU PMH: Bacteriuria, cx sent start abx - 03/25/2020 Encounter for general adult medical  examination without abnormal findings, Encounter for preventive health examination - 2016 Personal history of other diseases of the circulatory system, History of heart failure - 2016, History of congestive heart disease, - 2016 Personal history of other diseases of the musculoskeletal system and connective tissue, History of arthritis - 2016 Diabetes Type 2    FAMILY HISTORY: cardiac disorder - Runs In Family Hematuria - Runs In Family Hypertension - Runs In Family   SOCIAL HISTORY: Marital Status: Married Preferred Language: English; Race: White Current Smoking Status: Patient does not smoke anymore.   Tobacco Use Assessment Completed: Used Tobacco in last 30 days? Drinks 1 caffeinated drink per day.     Notes: No alcohol use, Occupation, Caffeine use, Married, Number of children, Former smoker   REVIEW OF SYSTEMS:    GU Review Male:   gross hematuria . Patient reports burning/ pain with urination and get up at night to urinate. Patient denies frequent urination, trouble starting your stream, hard to postpone urination, penile pain, stream starts and stops, leakage of urine, have to strain to urinate , and erection problems.  Gastrointestinal (Upper):   Patient denies nausea, vomiting, and indigestion/ heartburn.  Gastrointestinal (Lower):   Patient denies diarrhea and constipation.  Constitutional:   Patient denies fever, night sweats, weight loss, and fatigue.  Skin:   Patient denies skin rash/ lesion and itching.  Eyes:  Patient denies blurred vision and double vision.  Ears/ Nose/ Throat:   Patient denies sore throat and sinus problems.  Hematologic/Lymphatic:   Patient denies swollen glands and easy bruising.  Cardiovascular:   Patient denies leg swelling and chest pains.  Respiratory:   Patient denies cough and shortness of breath.  Endocrine:   Patient denies excessive thirst.  Musculoskeletal:   Patient reports back pain. Patient denies joint pain.  Neurological:   Patient  denies headaches and dizziness.  Psychologic:   Patient denies depression and anxiety.   VITAL SIGNS:      07/10/2020 10:08 AM  Weight 224 lb / 101.6 kg  Height 71 in / 180.34 cm  BP 146/83 mmHg  Pulse 73 /min  Temperature 98.2 F / 36.7 C  BMI 31.2 kg/m   MULTI-SYSTEM PHYSICAL EXAMINATION:    Constitutional: Well-nourished. No physical deformities. Normally developed. Good grooming.  Cardiovascular: Normal temperature, normal extremity pulses, no swelling, no varicosities.  Skin: No paleness, no jaundice, no cyanosis. No lesion, no ulcer, no rash.  Neurologic / Psychiatric: Oriented to time, oriented to place, oriented to person. No depression, no anxiety, no agitation.  Gastrointestinal: Obese abdomen. No mass, no tenderness, no rigidity. No CVA or flank tenderness.   Musculoskeletal: Normal gait and station of head and neck.     Complexity of Data:  Source Of History:  Patient, Medical Record Summary  Records Review:   Previous Doctor Records, Previous Hospital Records, Previous Patient Records  Urine Test Review:   Urinalysis, Urine Culture  X-Ray Review: KUB: Reviewed Films. Discussed With Patient.  Renal Ultrasound: Reviewed Films.  C.T. Stone Protocol: Reviewed Films. Reviewed Report.     07/10/20  Urinalysis  Urine Appearance Cloudy   Urine Color Amber   Urine Glucose Neg mg/dL  Urine Bilirubin Neg mg/dL  Urine Ketones Neg mg/dL  Urine Specific Gravity 1.025   Urine Blood 3+ ery/uL  Urine pH 5.5   Urine Protein 1+ mg/dL  Urine Urobilinogen 0.2 mg/dL  Urine Nitrites Neg   Urine Leukocyte Esterase 2+ leu/uL  Urine WBC/hpf 10 - 20/hpf   Urine RBC/hpf >60/hpf   Urine Epithelial Cells 0 - 5/hpf   Urine Bacteria Few (10-25/hpf)   Urine Mucous Not Present   Urine Yeast NS (Not Seen)   Urine Trichomonas Not Present   Urine Cystals NS (Not Seen)   Urine Casts NS (Not Seen)   Urine Sperm Not Present    PROCEDURES:         KUB - 57017  A single view of the abdomen  is obtained. Patient has a large greater than 1 cm left renal pelvis stone at the level of the expected location of the left UPJ. Numerous smaller stones are identified in a nonobstructing pattern within the confines of the left renal shadow as well. These grossly remain stable. Anatomical expected tract of the left ureter is grossly clear. Right renal shadow contains a few small nonobstructing calculi that appear stable. Tracing down the anatomical expected tract of the right ureter the opacity overlying the right sacral wing consistent with previously identified distal ureteral calculi grossly remains unchanged. Pelvic phlebolith remain stable. Bladder grossly appears free of obstruction. Stable degenerative changes noted along the lumbar spine.      Patient confirmed No Neulasta OnPro Device.            Urinalysis w/Scope Dipstick Dipstick Cont'd Micro  Color: Amber Bilirubin: Neg mg/dL WBC/hpf: 10 - 20/hpf  Appearance: Cloudy Ketones: Neg mg/dL  RBC/hpf: >60/hpf  Specific Gravity: 1.025 Blood: 3+ ery/uL Bacteria: Few (10-25/hpf)  pH: 5.5 Protein: 1+ mg/dL Cystals: NS (Not Seen)  Glucose: Neg mg/dL Urobilinogen: 0.2 mg/dL Casts: NS (Not Seen)    Nitrites: Neg Trichomonas: Not Present    Leukocyte Esterase: 2+ leu/uL Mucous: Not Present      Epithelial Cells: 0 - 5/hpf      Yeast: NS (Not Seen)      Sperm: Not Present    ASSESSMENT:      ICD-10 Details  1 GU:   Renal calculus - N20.0 Bilateral, Chronic, Stable  2   Ureteral calculus - N20.1 Right, Chronic, Stable  3   Gross hematuria - R31.0 Chronic, Stable   PLAN:           Orders Labs Urine Culture  X-Rays: KUB          Schedule Return Visit/Planned Activity: Next Available Appointment - Follow up MD, Schedule Surgery          Document Letter(s):  Created for Patient: Clinical Summary         Notes:   KUB continues to demonstrate a large but grossly nonobstructing left renal pelvis stone. Previously identified distal  right ureteral stone, also nonobstructing on prior ultrasound imaging studies is present. At this point I will discuss with his urologist about planning for definitive intervention. This may include shockwave lithotripsy for both the distal right stone and left renal pelvis stone both done as staged procedure also consideration for bilateral ureteroscopy. Ultimately patient would prefer having both sides treated at the same time which I do feel is reasonable. Ultimately plan of care will be determined by his urologist. Again I will discuss that with him and then have the patient scheduled appropriately in the near future. He does understand potential for staged procedure with both shockwave lithotripsy as well as ureteroscopy. He will continue tamsulosin, monitor himself for any new or worsening symptomatology including painful inability to void, uncontrollable pain/discomfort, uncontrollable nausea/vomiting, uncontrollable fevers/chills.   *  For ureteroscopy I described the risks which include heart attack, stroke, pulmonary embolus, death, bleeding, infection, damage to contiguous structures, positioning injury, ureteral stricture, ureteral avulsion, ureteral injury, need for ureteral stent, inability to perform ureteroscopy, need for an interval procedure, inability to clear stone burden, stent discomfort and pain.   For shockwave lithotripsy I described the risks which include arrhythmia, kidney contusion, kidney hemorrhage, need for transfusion, long-term risk of diabetes or hypertension, back discomfort, flank ecchymosis, flank abrasion, inability to break up stone, inability to pass stone fragments, Steinstrasse, infection associated with obstructing stones, need for different surgical procedure and possible need for repeat shockwave lithotripsy.   *   Urine culture sent today and if indicated appropriate antimicrobial treatment will be prescribed.    * Signed by Jiles Crocker, NP on 07/10/20 at  11:32 AM (EST)*     The information contained in this medical record document is considered private and confidential patient information. This information can only be used for the medical diagnosis and/or medical services that are being provided by the patient's selected caregivers. This information can only be distributed outside of the patient's care if the patient agrees and signs waivers of authorization for this information to be sent to an outside source or route.  Add: Office urine culture was negative.

## 2020-08-07 ENCOUNTER — Ambulatory Visit (HOSPITAL_BASED_OUTPATIENT_CLINIC_OR_DEPARTMENT_OTHER)
Admission: RE | Admit: 2020-08-07 | Discharge: 2020-08-07 | Disposition: A | Discharge status: Home / Self Care | Payer: Medicare HMO | Source: Ambulatory Visit | Attending: Urology | Admitting: Urology

## 2020-08-07 ENCOUNTER — Ambulatory Visit (HOSPITAL_BASED_OUTPATIENT_CLINIC_OR_DEPARTMENT_OTHER): Payer: Medicare HMO | Admitting: Anesthesiology

## 2020-08-07 ENCOUNTER — Encounter (HOSPITAL_BASED_OUTPATIENT_CLINIC_OR_DEPARTMENT_OTHER): Payer: Self-pay | Admitting: Urology

## 2020-08-07 ENCOUNTER — Encounter (HOSPITAL_BASED_OUTPATIENT_CLINIC_OR_DEPARTMENT_OTHER)
Admission: RE | Disposition: A | Discharge status: Home / Self Care | Payer: Self-pay | Source: Ambulatory Visit | Attending: Urology

## 2020-08-07 ENCOUNTER — Other Ambulatory Visit: Payer: Self-pay

## 2020-08-07 DIAGNOSIS — N201 Calculus of ureter: Secondary | ICD-10-CM | POA: Diagnosis not present

## 2020-08-07 DIAGNOSIS — N202 Calculus of kidney with calculus of ureter: Secondary | ICD-10-CM | POA: Insufficient documentation

## 2020-08-07 DIAGNOSIS — Z880 Allergy status to penicillin: Secondary | ICD-10-CM | POA: Insufficient documentation

## 2020-08-07 DIAGNOSIS — N200 Calculus of kidney: Secondary | ICD-10-CM

## 2020-08-07 DIAGNOSIS — Z79899 Other long term (current) drug therapy: Secondary | ICD-10-CM | POA: Diagnosis not present

## 2020-08-07 DIAGNOSIS — I350 Nonrheumatic aortic (valve) stenosis: Secondary | ICD-10-CM | POA: Diagnosis not present

## 2020-08-07 DIAGNOSIS — Z87442 Personal history of urinary calculi: Secondary | ICD-10-CM | POA: Insufficient documentation

## 2020-08-07 DIAGNOSIS — Z87891 Personal history of nicotine dependence: Secondary | ICD-10-CM | POA: Diagnosis not present

## 2020-08-07 DIAGNOSIS — F419 Anxiety disorder, unspecified: Secondary | ICD-10-CM | POA: Diagnosis not present

## 2020-08-07 DIAGNOSIS — E785 Hyperlipidemia, unspecified: Secondary | ICD-10-CM | POA: Diagnosis not present

## 2020-08-07 HISTORY — PX: HOLMIUM LASER APPLICATION: SHX5852

## 2020-08-07 HISTORY — PX: CYSTOSCOPY/RETROGRADE/URETEROSCOPY/STONE EXTRACTION WITH BASKET: SHX5317

## 2020-08-07 LAB — POCT I-STAT, CHEM 8
BUN: 17 mg/dL (ref 8–23)
Calcium, Ion: 1.07 mmol/L — ABNORMAL LOW (ref 1.15–1.40)
Chloride: 108 mmol/L (ref 98–111)
Creatinine, Ser: 1 mg/dL (ref 0.61–1.24)
Glucose, Bld: 115 mg/dL — ABNORMAL HIGH (ref 70–99)
HCT: 42 % (ref 39.0–52.0)
Hemoglobin: 14.3 g/dL (ref 13.0–17.0)
Potassium: 3.9 mmol/L (ref 3.5–5.1)
Sodium: 142 mmol/L (ref 135–145)
TCO2: 24 mmol/L (ref 22–32)

## 2020-08-07 LAB — GLUCOSE, CAPILLARY: Glucose-Capillary: 145 mg/dL — ABNORMAL HIGH (ref 70–99)

## 2020-08-07 SURGERY — CYSTOSCOPY, WITH CALCULUS REMOVAL USING BASKET
Anesthesia: General | Site: Renal | Laterality: Bilateral

## 2020-08-07 MED ORDER — ACETAMINOPHEN 500 MG PO TABS
ORAL_TABLET | ORAL | Status: AC
  Filled 2020-08-07: qty 2

## 2020-08-07 MED ORDER — LIDOCAINE 2% (20 MG/ML) 5 ML SYRINGE
INTRAMUSCULAR | Status: DC | PRN
  Administered 2020-08-07: 30 mg via INTRAVENOUS

## 2020-08-07 MED ORDER — LIDOCAINE 2% (20 MG/ML) 5 ML SYRINGE
INTRAMUSCULAR | Status: AC
  Filled 2020-08-07: qty 5

## 2020-08-07 MED ORDER — PROPOFOL 10 MG/ML IV BOLUS
INTRAVENOUS | Status: DC | PRN
  Administered 2020-08-07: 200 mg via INTRAVENOUS

## 2020-08-07 MED ORDER — FENTANYL CITRATE (PF) 100 MCG/2ML IJ SOLN
INTRAMUSCULAR | Status: DC | PRN
  Administered 2020-08-07: 50 ug via INTRAVENOUS
  Administered 2020-08-07 (×2): 25 ug via INTRAVENOUS
  Administered 2020-08-07 (×2): 50 ug via INTRAVENOUS

## 2020-08-07 MED ORDER — EPHEDRINE 5 MG/ML INJ
INTRAVENOUS | Status: AC
  Filled 2020-08-07: qty 10

## 2020-08-07 MED ORDER — FENTANYL CITRATE (PF) 100 MCG/2ML IJ SOLN: 25.0000 ug | INTRAMUSCULAR | Status: DC | PRN

## 2020-08-07 MED ORDER — ONDANSETRON HCL 4 MG/2ML IJ SOLN
INTRAMUSCULAR | Status: DC | PRN
  Administered 2020-08-07: 4 mg via INTRAVENOUS

## 2020-08-07 MED ORDER — PROMETHAZINE HCL 25 MG/ML IJ SOLN: 6.2500 mg | INTRAMUSCULAR | Status: DC | PRN

## 2020-08-07 MED ORDER — OXYCODONE HCL 5 MG PO TABS: 5.0000 mg | ORAL_TABLET | Freq: Once | ORAL | Status: DC | PRN

## 2020-08-07 MED ORDER — ONDANSETRON HCL 4 MG/2ML IJ SOLN
INTRAMUSCULAR | Status: AC
  Filled 2020-08-07: qty 2

## 2020-08-07 MED ORDER — FENTANYL CITRATE (PF) 100 MCG/2ML IJ SOLN
INTRAMUSCULAR | Status: AC
  Filled 2020-08-07: qty 2

## 2020-08-07 MED ORDER — ACETAMINOPHEN 500 MG PO TABS
1000.0000 mg | ORAL_TABLET | Freq: Once | ORAL | Status: AC
  Administered 2020-08-07: 1000 mg via ORAL

## 2020-08-07 MED ORDER — MEPERIDINE HCL 25 MG/ML IJ SOLN
6.2500 mg | INTRAMUSCULAR | Status: DC | PRN
Start: 2020-08-07 — End: 2020-08-07

## 2020-08-07 MED ORDER — SODIUM CHLORIDE 0.9 % IR SOLN
Status: DC | PRN
  Administered 2020-08-07 (×2): 3000 mL

## 2020-08-07 MED ORDER — OXYCODONE HCL 5 MG/5ML PO SOLN: 5.0000 mg | Freq: Once | ORAL | Status: DC | PRN

## 2020-08-07 MED ORDER — MIDAZOLAM HCL 2 MG/2ML IJ SOLN: 0.5000 mg | Freq: Once | INTRAMUSCULAR | Status: DC | PRN

## 2020-08-07 MED ORDER — IOHEXOL 300 MG/ML  SOLN
INTRAMUSCULAR | Status: DC | PRN
  Administered 2020-08-07: 16 mL

## 2020-08-07 MED ORDER — DEXAMETHASONE SODIUM PHOSPHATE 10 MG/ML IJ SOLN
INTRAMUSCULAR | Status: DC | PRN
  Administered 2020-08-07: 10 mg via INTRAVENOUS

## 2020-08-07 MED ORDER — CEFAZOLIN SODIUM-DEXTROSE 2-4 GM/100ML-% IV SOLN
2.0000 g | Freq: Once | INTRAVENOUS | Status: AC
  Administered 2020-08-07: 2 g via INTRAVENOUS

## 2020-08-07 MED ORDER — LACTATED RINGERS IV SOLN: INTRAVENOUS | Status: DC

## 2020-08-07 MED ORDER — EPHEDRINE SULFATE-NACL 50-0.9 MG/10ML-% IV SOSY
PREFILLED_SYRINGE | INTRAVENOUS | Status: DC | PRN
  Administered 2020-08-07 (×2): 10 mg via INTRAVENOUS

## 2020-08-07 MED ORDER — CEFAZOLIN SODIUM-DEXTROSE 2-4 GM/100ML-% IV SOLN
INTRAVENOUS | Status: AC
  Filled 2020-08-07: qty 100

## 2020-08-07 MED ORDER — DEXAMETHASONE SODIUM PHOSPHATE 10 MG/ML IJ SOLN
INTRAMUSCULAR | Status: AC
  Filled 2020-08-07: qty 1

## 2020-08-07 SURGICAL SUPPLY — 29 items
APL SKNCLS STERI-STRIP NONHPOA (GAUZE/BANDAGES/DRESSINGS) ×1
BAG DRAIN URO-CYSTO SKYTR STRL (DRAIN) ×2 IMPLANT
BAG DRN UROCATH (DRAIN) ×1
BASKET ZERO TIP NITINOL 2.4FR (BASKET) ×2 IMPLANT
BENZOIN TINCTURE PRP APPL 2/3 (GAUZE/BANDAGES/DRESSINGS) ×2 IMPLANT
BSKT STON RTRVL ZERO TP 2.4FR (BASKET) ×1
CATH URET 5FR 28IN CONE TIP (BALLOONS)
CATH URET 5FR 28IN OPEN ENDED (CATHETERS) IMPLANT
CATH URET 5FR 70CM CONE TIP (BALLOONS) IMPLANT
CATH URET DUAL LUMEN 6-10FR 50 (CATHETERS) IMPLANT
CLOTH BEACON ORANGE TIMEOUT ST (SAFETY) ×2 IMPLANT
DRSG TEGADERM 2-3/8X2-3/4 SM (GAUZE/BANDAGES/DRESSINGS) ×2 IMPLANT
FIBER LASER FLEXIVA 365 (UROLOGICAL SUPPLIES) ×2 IMPLANT
GLOVE SURG ENC MOIS LTX SZ7.5 (GLOVE) ×2 IMPLANT
GLOVE SURG ENC MOIS LTX SZ8 (GLOVE) IMPLANT
GLOVE SURG UNDER POLY LF SZ7.5 (GLOVE) ×6 IMPLANT
GOWN STRL REUS W/TWL LRG LVL3 (GOWN DISPOSABLE) ×4 IMPLANT
GUIDEWIRE ANG ZIPWIRE 038X150 (WIRE) ×2 IMPLANT
GUIDEWIRE STR DUAL SENSOR (WIRE) ×4 IMPLANT
GUIDEWIRE ZIPWRE .038 STRAIGHT (WIRE) IMPLANT
IV NS IRRIG 3000ML ARTHROMATIC (IV SOLUTION) ×4 IMPLANT
KIT TURNOVER CYSTO (KITS) ×2 IMPLANT
MANIFOLD NEPTUNE II (INSTRUMENTS) ×2 IMPLANT
NS IRRIG 500ML POUR BTL (IV SOLUTION) ×2 IMPLANT
PACK CYSTO (CUSTOM PROCEDURE TRAY) ×2 IMPLANT
SHEATH URET ACCESS 12FR/35CM (UROLOGICAL SUPPLIES) ×2 IMPLANT
STENT URET 6FRX26 CONTOUR (STENTS) ×2 IMPLANT
TUBE CONNECTING 12X1/4 (SUCTIONS) ×2 IMPLANT
TUBING UROLOGY SET (TUBING) ×2 IMPLANT

## 2020-08-07 NOTE — Discharge Instructions (Signed)
Ureteral Stent Implantation, Care After This sheet gives you information about how to care for yourself after your procedure. Your health care provider may also give you more specific instructions. If you have problems or questions, contact your health care provider.  Removal of the stent: Remove the stent on Monday morning, March 14th 2022, by pulling the string as instructed  What can I expect after the procedure? After the procedure, it is common to have:  Nausea.  Mild pain when you urinate. You may feel this pain in your lower back or lower abdomen. The pain should stop within a few minutes after you urinate. This may last for up to 1 week.  A small amount of blood in your urine for several days. Follow these instructions at home: Medicines  Take over-the-counter and prescription medicines only as told by your health care provider.  If you were prescribed an antibiotic medicine, take it as told by your health care provider. Do not stop taking the antibiotic even if you start to feel better.  Do not drive for 24 hours if you were given a sedative during your procedure.  Ask your health care provider if the medicine prescribed to you requires you to avoid driving or using heavy machinery. Activity  Rest as told by your health care provider.  Avoid sitting for a long time without moving. Get up to take short walks every 1-2 hours. This is important to improve blood flow and breathing. Ask for help if you feel weak or unsteady.  Return to your normal activities as told by your health care provider. Ask your health care provider what activities are safe for you. General instructions  Watch for any blood in your urine. Call your health care provider if the amount of blood in your urine increases.  If you have a catheter: ? Follow instructions from your health care provider about taking care of your catheter and collection bag. ? Do not take baths, swim, or use a hot tub until your  health care provider approves. Ask your health care provider if you may take showers. You may only be allowed to take sponge baths.  Drink enough fluid to keep your urine pale yellow.  Do not use any products that contain nicotine or tobacco, such as cigarettes, e-cigarettes, and chewing tobacco. These can delay healing after surgery. If you need help quitting, ask your health care provider.  Keep all follow-up visits as told by your health care provider. This is important.   Contact a health care provider if:  You have pain that gets worse or does not get better with medicine, especially pain when you urinate.  You have difficulty urinating.  You feel nauseous or you vomit repeatedly during a period of more than 2 days after the procedure. Get help right away if:  Your urine is dark red or has blood clots in it.  You are leaking urine (have incontinence).  The end of the stent comes out of your urethra.  You cannot urinate.  You have sudden, sharp, or severe pain in your abdomen or lower back.  You have a fever.  You have swelling or pain in your legs.  You have difficulty breathing. Summary  After the procedure, it is common to have mild pain when you urinate that goes away within a few minutes after you urinate. This may last for up to 1 week.  Watch for any blood in your urine. Call your health care provider if the amount  of blood in your urine increases.  Take over-the-counter and prescription medicines only as told by your health care provider.  Drink enough fluid to keep your urine pale yellow. This information is not intended to replace advice given to you by your health care provider. Make sure you discuss any questions you have with your health care provider. Document Revised: 02/20/2018 Document Reviewed: 02/21/2018 Elsevier Patient Education  2021 [place] Instructions  Activity: Get plenty of rest for the remainder of the  day. A responsible adult should stay with you for 24 hours following the procedure.  For the next 24 hours, DO NOT: -Drive a car -Paediatric nurse -Drink alcoholic beverages -Take any medication unless instructed by your physician -Make any legal decisions or sign important papers.  Meals: Start with liquid foods such as gelatin or soup. Progress to regular foods as tolerated. Avoid greasy, spicy, heavy foods. If nausea and/or vomiting occur, drink only clear liquids until the nausea and/or vomiting subsides. Call your physician if vomiting continues.  Special Instructions/Symptoms: Your throat may feel dry or sore from the anesthesia or the breathing tube placed in your throat during surgery. If this causes discomfort, gargle with warm salt water. The discomfort should disappear within 24 hours.  If you had a scopolamine patch placed behind your ear for the management of post- operative nausea and/or vomiting:  1. The medication in the patch is effective for 72 hours, after which it should be removed.  Wrap patch in a tissue and discard in the trash. Wash hands thoroughly with soap and water. 2. You may remove the patch earlier than 72 hours if you experience unpleasant side effects which may include dry mouth, dizziness or visual disturbances. 3. Avoid touching the patch. Wash your hands with soap and water after contact with the patch.

## 2020-08-07 NOTE — Interval H&P Note (Signed)
History and Physical Interval Note:  08/07/2020 1:05 PM  [name]  has presented today for surgery, with the diagnosis of RIGHT URETERAL STONE, BILATERAL RENAL STONES.  The various methods of treatment have been discussed with the patient and family. After consideration of risks, benefits and other options for treatment, the patient has consented to  Procedure(s): CYSTOSCOPY/RETROGRADE/URETEROSCOPY/STONE EXTRACTION WITH BASKET/ STENT PLACEMENT (Bilateral) as a surgical intervention.  The patient's history has been reviewed, patient examined, no change in status, stable for surgery.  I have reviewed the patient's chart and labs.  Questions were answered to the patient's satisfaction.  We discussed possibility of stent/staged procedure. We also discussed stent pain. He felt like he couldn't void the last time he had stents but it might have been stent frequency and urgency. He is on tamsulosin now. No fever or dysuria.    [name]

## 2020-08-07 NOTE — Anesthesia Procedure Notes (Signed)
Procedure Name: LMA Insertion Date/Time: 08/07/2020 1:20 PM Performed by: Genelle Bal, CRNA Pre-anesthesia Checklist: Patient identified, Emergency Drugs available, Suction available and Patient being monitored Patient Re-evaluated:Patient Re-evaluated prior to induction Oxygen Delivery Method: Circle system utilized Preoxygenation: Pre-oxygenation with 100% oxygen Induction Type: IV induction Ventilation: Mask ventilation without difficulty LMA: LMA inserted LMA Size: 5.0 Number of attempts: 1 Airway Equipment and Method: Bite block Placement Confirmation: positive ETCO2 Tube secured with: Tape Dental Injury: Teeth and Oropharynx as per pre-operative assessment

## 2020-08-07 NOTE — Transfer of Care (Signed)
Immediate Anesthesia Transfer of Care Note  Patient: Hayden Rivera  Procedure(s) Performed: CYSTOSCOPY/RETROGRADE/URETEROSCOPY, HOLMIUM LASER LITHOTRIPSY /STONE EXTRACTION WITH BASKET/ LEFT STENT PLACEMENT (Bilateral Renal) HOLMIUM LASER APPLICATION (Bilateral Renal)  Patient Location: PACU  Anesthesia Type:General  Level of Consciousness: awake, alert  and oriented  Airway & Oxygen Therapy: Patient Spontanous Breathing and Patient connected to face mask oxygen  Post-op Assessment: Report given to RN and Post -op Vital signs reviewed and stable  Post vital signs: Reviewed and stable  Last Vitals:  Vitals Value Taken Time  BP 136/77 08/07/20 1453  Temp    Pulse 58 08/07/20 1454  Resp 14 08/07/20 1454  SpO2 97 % 08/07/20 1454  Vitals shown include unvalidated device data.  Last Pain:  Vitals:   08/07/20 1134  TempSrc: Oral         Complications: No complications documented.

## 2020-08-07 NOTE — Op Note (Signed)
Preoperative diagnosis: Right ureteral stone, left renal stones Postoperative diagnosis: Same  Procedure: Cystoscopy with bilateral retrograde pyelogram, bilateral ureteroscopy / laser lithotripsy, left ureteral stent placement  Surgeon: Junious Silk  Anesthesia: General  Indication for procedure: [name] is a 70 year old male with a history of stones.  He underwent evaluation last fall and a 9 mm right distal stone was noted and 13 mm left renal pelvic stone and 8 mm left upper pole stone noted.  He was having abdominal pain and some of that was colitis which she recovered from.  He was brought now for treatment of his stones.  They were not yet causing hydronephrosis or obstruction but were at a point where they were at risk to do so.  Findings: On cystoscopy the urethra prostate and bladder were unremarkable.  No stone or foreign body in bladder.  No mucosal lesions noted.  Right retrograde pyelogram-this outlined a single ureter single collecting system unit with a filling defect in the distal ureter consistent with the stone.  After the stone was removed I injected contrast up the ureter and it briskly drained without any extravasation and therefore I did not stent the right side.  Left retrograde pyelogram-this outlined a single ureter single collecting system unit with a filling defect in the renal pelvis consistent with the stone.  Description of procedure: After consent was obtained patient brought to the operating room.  After adequate anesthesia he was placed in lithotomy position and prepped and draped in the usual sterile fashion.  A timeout was performed to correct the patient and procedure.  Cystoscope was passed per urethra and I inspected the bladder.  I then cannulated the right ureteral orifice with a 5 [place] open-ended catheter and retrograde injection of contrast was performed.  I passed a sensor wire up to the collecting system and coiled out.  I passed to dual-lumen semirigid  ureteroscope adjacent to the wire where the stone was noted.  A 375 m laser fiber was advanced and the stone was dusted.  The stone was not too dense.  It broke up very well.  Most of the residual fragments were sequentially swept out into the bladder with a 0 tip basket.  Inspection 1 last time noted there to be no other stones or ureteral injury.  The semirigid scope was backed out.  I then advanced the 5 [place] open-ended catheter over the sensor wire into the mid ureter on the right and remove the wire.  Retrograde injection of contrast was performed for a pullout retrograde.  The ureter appeared normal and intact without any extravasation and then drained briskly.  Attention was turned back with the cystoscope to the left side where the left ureteral orifice was cannulated with a 5 [place] open-ended catheter and retrograde injection of contrast was performed.  A sensor wire was advanced to coiled in the collecting system.  The scope was backed out.  I used the access sheath to place 2 wires and left a Glidewire as the safety wire.  I repassed the access sheath and it went very easily each time.  I then passed a dual-lumen digital ureteroscope up to the left kidney.  The large stone was noted in the renal pelvis and it was treated with the same 375 m laser fiber.  It was dusted to about a third of the way and the last piece was pushed into the upper calyx.  Here it was finished off.  I then found the 8 mm upper pole stone  and it was dusted.  There were a lot of stone fragments and dust but I do not think there were any significant fragments remained.  I even passed a 0 tip basket and took a couple passes but only got 1 to 2 mm pieces.  One final inspection again noted there to be no significant fragment.  The access sheath was backed out on the ureteroscope and the ureter inspected on the way out noted to be normal.  There wire was backloaded on the cystoscope and a 626 cm stent advanced and the wire  removed.  A good coil seen in the kidney and a good coil in the bladder.  The scope was removed and the bladder drained.  He was awakened taken recovery in stable condition.  Complications: None  Blood loss: Minimal  Specimens: None  Drains: 6 x 26 cm stent with string  Disposition: Patient stable to PACU

## 2020-08-07 NOTE — Anesthesia Preprocedure Evaluation (Addendum)
Anesthesia Evaluation  Patient identified by MRN, date of birth, ID band Patient awake    Reviewed: Allergy & Precautions, NPO status , Patient's Chart, lab work & pertinent test results, reviewed documented beta blocker date and time   History of Anesthesia Complications Negative for: history of anesthetic complications  Airway Mallampati: II  TM Distance: >3 FB Neck ROM: Full    Dental  (+) Dental Advisory Given   Pulmonary former smoker,  08/05/2020 SARS coronavirus NEG   breath sounds clear to auscultation       Cardiovascular hypertension, Pt. on home beta blockers and Pt. on medications (-) angina+ CAD, + Past MI, + Cardiac Stents and + CABG (2017)  + dysrhythmias (h/o Vfib arrest 2005) Atrial Fibrillation and Ventricular Fibrillation  Rhythm:Regular Rate:Normal  '19 stress: Nuclear stress EF: 48%. Apical and anteroseptal wall hypokinesis, no ST segment deviation noted during stress.  There is a large defect of severe severity present in the mid anteroseptal, mid inferoseptal, mid inferior, mid inferolateral, apical anterior, apical septal, apical inferior, apical lateral and apex location. This is an intermediate risk study. Appears to be large prior infarct pattern as described above. No ischemia identified.  '19 ECHO: EF 45-50%, Akinesis of the distal septum/apex; overall mildly reduced LV systolic function; moderate diastolic dysfunction; calcified  aortic valve with fixed noncoronary cusp; mild AS (mean gradient 12 mmHg) and trace AI; milldy dilated ascending aorta; mild TR with mildly elevated pulmonary pressure.    Neuro/Psych Anxiety Chronic back pain    GI/Hepatic Neg liver ROS, GERD  Medicated and Controlled,  Endo/Other  diabetes (glu 115), Oral Hypoglycemic AgentsMorbid obesity  Renal/GU negative Renal ROS     Musculoskeletal   Abdominal (+) + obese,   Peds  Hematology negative hematology ROS (+)    Anesthesia Other Findings   Reproductive/Obstetrics                            Anesthesia Physical Anesthesia Plan  ASA: III  Anesthesia Plan: General   Post-op Pain Management:    Induction: Intravenous  PONV Risk Score and Plan: 2 and Ondansetron and Dexamethasone  Airway Management Planned: LMA  Additional Equipment: None  Intra-op Plan:   Post-operative Plan:   Informed Consent: I have reviewed the patients History and Physical, chart, labs and discussed the procedure including the risks, benefits and alternatives for the proposed anesthesia with the patient or authorized representative who has indicated his/her understanding and acceptance.     Dental advisory given  Plan Discussed with: CRNA and Surgeon  Anesthesia Plan Comments:        Anesthesia Quick Evaluation

## 2020-08-07 NOTE — Anesthesia Postprocedure Evaluation (Signed)
Anesthesia Post Note  Patient: Hayden Rivera  Procedure(s) Performed: CYSTOSCOPY/RETROGRADE/URETEROSCOPY, HOLMIUM LASER LITHOTRIPSY /STONE EXTRACTION WITH BASKET/ LEFT STENT PLACEMENT (Bilateral Renal) HOLMIUM LASER APPLICATION (Bilateral Renal)     Patient location during evaluation: PACU Anesthesia Type: General Level of consciousness: awake and alert, patient cooperative and oriented Pain management: pain level controlled Vital Signs Assessment: post-procedure vital signs reviewed and stable Respiratory status: spontaneous breathing, nonlabored ventilation and respiratory function stable Cardiovascular status: blood pressure returned to baseline and stable Postop Assessment: no apparent nausea or vomiting Anesthetic complications: no   No complications documented.  Last Vitals:  Vitals:   08/07/20 1455 08/07/20 1500  BP: 136/77 131/71  Pulse: (!) 58 (!) 59  Resp: 14 14  Temp: 36.6 C   SpO2: 97% 97%    Last Pain:  Vitals:   08/07/20 1455  TempSrc:   PainSc: 0-No pain                 [name],E. Broderic Bara

## 2020-08-10 ENCOUNTER — Encounter (HOSPITAL_BASED_OUTPATIENT_CLINIC_OR_DEPARTMENT_OTHER): Payer: Self-pay | Admitting: Urology

## 2020-08-28 ENCOUNTER — Telehealth: Payer: Self-pay | Admitting: Cardiology

## 2020-08-28 MED ORDER — CLOPIDOGREL BISULFATE 75 MG PO TABS: 75.0000 mg | ORAL_TABLET | Freq: Every day | ORAL | 1 refills | Status: DC

## 2020-08-28 NOTE — Telephone Encounter (Signed)
Medication sent to pharmacy  

## 2020-08-28 NOTE — Telephone Encounter (Signed)
   1. Which medications need to be refilled? (please list name of each medication and dose if known)   PLAVIX 75 MG   2. Which pharmacy/location (including street and city if local pharmacy) is medication to be sent to? [facility]   3. Do they need a 30 day or 90 day supply? 90  Patient has run out of medication. Needs to have RX called to [facility], [place] until his mail order comes in.

## 2020-08-29 ENCOUNTER — Telehealth: Payer: Self-pay | Admitting: Physician Assistant

## 2020-08-29 NOTE — Telephone Encounter (Signed)
Spoke with [facility] they did not receive Plavix prescription which epic indicates was phoned in on 4/1.  I have reviewed the patient's last office note with recommendation to continue indefinite DAPT.  I have authorized a prescription of clopidogrel 75 mg 1 p.o. daily #30 no refill to [facility].  Patient will be receiving his mail order clopidogrel later this week.  Patient's wife was notified and she will be picking this up later today.

## 2020-10-09 ENCOUNTER — Encounter: Payer: Medicare HMO | Admitting: Gastroenterology

## 2020-10-19 ENCOUNTER — Encounter: Payer: Self-pay | Admitting: Gastroenterology

## 2020-10-19 ENCOUNTER — Ambulatory Visit (AMBULATORY_SURGERY_CENTER): Payer: Medicare HMO | Admitting: Gastroenterology

## 2020-10-19 ENCOUNTER — Other Ambulatory Visit: Payer: Self-pay

## 2020-10-19 VITALS — BP 135/64 | HR 46 | Temp 97.7°F | Resp 21 | Ht 71.0 in | Wt 228.0 lb

## 2020-10-19 DIAGNOSIS — I8500 Esophageal varices without bleeding: Secondary | ICD-10-CM

## 2020-10-19 DIAGNOSIS — K7460 Unspecified cirrhosis of liver: Secondary | ICD-10-CM | POA: Diagnosis not present

## 2020-10-19 MED ORDER — SODIUM CHLORIDE 0.9 % IV SOLN: 500.0000 mL | INTRAVENOUS | Status: AC

## 2020-10-19 MED ORDER — SODIUM CHLORIDE 0.9 % IV SOLN: 500.0000 mL | Freq: Once | INTRAVENOUS | Status: DC

## 2020-10-19 NOTE — Progress Notes (Signed)
No problems noted in the recovery room. maw 

## 2020-10-19 NOTE — Patient Instructions (Addendum)
You may resume your current medications today including your PLAVIX. Repeat upper Endoscopy in 3 years for surveillance. The office will call you to schedule an appointment to see Dr. Ardis Hughs in August 2022.  If you have not heard from them by next week, give the scheduler a call.  Please call if any questions or concerns.     YOU HAD AN ENDOSCOPIC PROCEDURE TODAY AT [facility]:   Refer to the procedure report that was given to you for any specific questions about what was found during the examination.  If the procedure report does not answer your questions, please call your gastroenterologist to clarify.  If you requested that your care partner not be given the details of your procedure findings, then the procedure report has been included in a sealed envelope for you to review at your convenience later.  YOU SHOULD EXPECT: Some feelings of bloating in the abdomen. Passage of more gas than usual.  Walking can help get rid of the air that was put into your GI tract during the procedure and reduce the bloating. If you had a lower endoscopy (such as a colonoscopy or flexible sigmoidoscopy) you may notice spotting of blood in your stool or on the toilet paper. If you underwent a bowel prep for your procedure, you may not have a normal bowel movement for a few days.  Please Note:  You might notice some irritation and congestion in your nose or some drainage.  This is from the oxygen used during your procedure.  There is no need for concern and it should clear up in a day or so.  SYMPTOMS TO REPORT IMMEDIATELY:    Following upper endoscopy (EGD)  Vomiting of blood or coffee ground material  New chest pain or pain under the shoulder blades  Painful or persistently difficult swallowing  New shortness of breath  Fever of 100F or higher  Black, tarry-looking stools  For urgent or emergent issues, a gastroenterologist can be reached at any hour by calling [phone]. Do not  use MyChart messaging for urgent concerns.    DIET:  We do recommend a small meal at first, but then you may proceed to your regular diet.  Drink plenty of fluids but you should avoid alcoholic beverages for 24 hours.  ACTIVITY:  You should plan to take it easy for the rest of today and you should NOT DRIVE or use heavy machinery until tomorrow (because of the sedation medicines used during the test).    FOLLOW UP: Our staff will call the number listed on your records 48-72 hours following your procedure to check on you and address any questions or concerns that you may have regarding the information given to you following your procedure. If we do not reach you, we will leave a message.  We will attempt to reach you two times.  During this call, we will ask if you have developed any symptoms of COVID 19. If you develop any symptoms (ie: fever, flu-like symptoms, shortness of breath, cough etc.) before then, please call [phone].  If you test positive for Covid 19 in the 2 weeks post procedure, please call and report this information to [place].    If any biopsies were taken you will be contacted by phone or by letter within the next 1-3 weeks.  Please call us at [phone] if you have not heard about the biopsies in 3 weeks.    SIGNATURES/CONFIDENTIALITY: You and/or your care partner have signed  paperwork which will be entered into your electronic medical record.  These signatures attest to the fact that that the information above on your After Visit Summary has been reviewed and is understood.  Full responsibility of the confidentiality of this discharge information lies with you and/or your care-partner.

## 2020-10-19 NOTE — Progress Notes (Signed)
To PACU, VSS. Report to [email or web address] 

## 2020-10-19 NOTE — Op Note (Signed)
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 The patient's tolerance of                            previous anesthesia was also reviewed. The risks                            and benefits of the procedure and the sedation                            options and risks were discussed with the patient.                            All questions were answered, and informed consent                            was obtained. Prior Anticoagulants: The patient has                            taken Plavix (clopidogrel), last dose was day of                            procedure. ASA Grade Assessment: III - A patient                            with severe systemic disease. After reviewing the                            risks and benefits, the patient was deemed in                            satisfactory condition to undergo the procedure.                           After obtaining informed consent, the endoscope was  passed under direct vision. Throughout the                            procedure, the patient's blood pressure, pulse, and                            oxygen saturations were monitored continuously. The                            Endoscope was introduced through the mouth, and                            advanced to the second part of duodenum. The upper                            GI  endoscopy was accomplished without difficulty.                            The patient tolerated the procedure well. Scope In: Scope Out: Findings:                 Grade I varices were found in the lower third of                            the esophagus.                           The exam was otherwise without abnormality.                           No gastric varices, no portal gastropathy changes. Complications:            No immediate complications. Estimated blood loss:                            None. Estimated Blood Loss:     Estimated blood loss: none. Impression:               - Grade I esophageal varices.                           - The examination was otherwise normal. Recommendation:           - Patient has a contact number available for                            emergencies. The signs and symptoms of potential                            delayed complications were discussed with the                            patient. Return to normal activities tomorrow.                            Written discharge instructions were provided to the  patient.                           - Resume previous diet.                           - Continue present medications.                           - Repeat upper endoscopy in 3 years for                            surveillance.                           - OV with Dr. Ardis Hughs 12/2020 Milus Banister, MD 10/19/2020 10:07:57 AM This report has been signed electronically.

## 2020-10-21 ENCOUNTER — Telehealth: Payer: Self-pay

## 2020-10-21 NOTE — Telephone Encounter (Signed)
  Follow up Call-  Call back number 10/19/2020 05/15/2020 08/01/2018  Post procedure Call Back phone  # [phone]  Permission to leave phone message Yes Yes Yes  Some recent data might be hidden     Patient questions:  Do you have a fever, pain , or abdominal swelling? No. Pain Score  0 *  Have you tolerated food without any problems? Yes.    Have you been able to return to your normal activities? Yes.    Do you have any questions about your discharge instructions: Diet   No.  Medications  No. Follow up visit  No.  Do you have questions or concerns about your Care? No.  Actions: * If pain score is 4 or above: No action needed, pain <4.

## 2020-10-22 DIAGNOSIS — I509 Heart failure, unspecified: Secondary | ICD-10-CM | POA: Diagnosis not present

## 2020-10-22 DIAGNOSIS — E119 Type 2 diabetes mellitus without complications: Secondary | ICD-10-CM | POA: Diagnosis not present

## 2020-10-22 DIAGNOSIS — N200 Calculus of kidney: Secondary | ICD-10-CM | POA: Diagnosis not present

## 2020-11-18 ENCOUNTER — Other Ambulatory Visit: Payer: Self-pay

## 2020-11-18 ENCOUNTER — Encounter: Payer: Self-pay | Admitting: *Deleted

## 2020-11-18 ENCOUNTER — Ambulatory Visit (INDEPENDENT_AMBULATORY_CARE_PROVIDER_SITE_OTHER): Payer: Medicare HMO | Admitting: Cardiology

## 2020-11-18 VITALS — BP 134/76 | HR 60 | Ht 71.0 in | Wt 238.0 lb

## 2020-11-18 DIAGNOSIS — E782 Mixed hyperlipidemia: Secondary | ICD-10-CM

## 2020-11-18 DIAGNOSIS — I10 Essential (primary) hypertension: Secondary | ICD-10-CM

## 2020-11-18 DIAGNOSIS — I2510 Atherosclerotic heart disease of native coronary artery without angina pectoris: Secondary | ICD-10-CM | POA: Diagnosis not present

## 2020-11-18 DIAGNOSIS — I350 Nonrheumatic aortic (valve) stenosis: Secondary | ICD-10-CM

## 2020-11-18 NOTE — Patient Instructions (Addendum)

## 2020-11-18 NOTE — Progress Notes (Signed)
Clinical Summary Hayden Rivera is a 70 y.o.maleseen today for follow up of the following medical problems.    1. CAD - multiple PCIs, CABG in 02/2016 08/2017 nuclear stress no ischemia, large prior infarct  - committed to indefinite DAPT due to long history of significant disease and interventions     - no recent chest pains. No SOB/DOE - compliant with meds. Cough on ACEI in the past, we had not started ARB due to some soft bp's in the past.    2. ICM - Jan 2019 echo LVEF 45-50%, akinesis mid-apicalanteroseptal wall. Grade II diastolic dysfunction  no LE edema, no SOB/DOE    3. Mild aortic stenosis Jan 2019 mild AS: mean grade 12, AVA VTI 1.81 - no recent symptoms.     4. HTN -cough on ramipril.  He is compliant with meds   5. Hyperlipidemia Jan 2020 TC 131 TG 182 HDL 45 LDL 50 -  labs with [facility] and pcp   07/2019 TC 127 TG 472 LDL direct 396.   6. DM2 - had been on metformin, he reports sugars improved and he reports pcp stopped metformin.          SH: retired Scientist, product/process development    Past Medical History:  Diagnosis Date   Anginal pain ([place])    occ   Aortic stenosis 06/16/2017   Mild, noted on ECHO   Arthritis    Atrial fibrillation ([place]) 02/2016   post op   CAD (coronary artery disease)    a. Anterior STEMI 2005 c/b vfib arrest, PCI to LAD at [facility]. b. Stent thrombosis 2006 with DES within prior LAD stent at [facility]. C. 09/2012: s/p balloon angioplasty to mLAD for severe stenosis in previously stented segment & DES to LCx; initial enz neg but ruled in for NSTEMI after post-cath vagal sx, felt d/t distal emboliz of thrombus during case.   Cancer ([place])    skin ca melanoma, pt states was removed from back   Chronic back pain    Dependent edema    Mild, occ NONE RECENT   Diarrhea    DM type 2 (diabetes mellitus, type 2) (HCC)    diet controlled   Elevated hemidiaphragm    a. Noted 09/2012 - instructed to f/u PCP.   Heart murmur    History of kidney  stones    Hyperlipidemia    Hypertension    Ischemic cardiomyopathy    a. Unclear prior EF but pt was told heart was weakened in past. b. EF normal 09/2012.   Myocardial infarction ([place]) 2005   TOTAL MI'S 4 LAST ONE 2017   Retinal tear, right    Ventricular fibrillation ([place])    a. VF arrest 2005 in setting of STEMI.   Wears glasses    Wears glasses    for reading     Allergies  Allergen Reactions   Altace [Ramipril] Shortness Of Breath and Other (See Comments)    cough   Penicillins Swelling    Has patient had a PCN reaction causing immediate rash, facial/tongue/throat swelling, SOB or lightheadedness with hypotension:unsure Has patient had a PCN reaction causing severe rash involving mucus membranes or skin necrosis:unsure Has patient had a PCN reaction that required hospitalization:No Has patient had a PCN reaction occurring within the last 10 years:NO If all of the above answers are "NO", then may proceed with Cephalosporin use.  Childhood reaction       Current Outpatient Medications  Medication Sig Dispense  Refill   acetaminophen (TYLENOL) 500 MG tablet Take 500 mg by mouth every 6 (six) hours as needed (PAIN).      aspirin 81 MG tablet Take 1 tablet (81 mg total) by mouth daily.     cetirizine (ZYRTEC) 10 MG tablet Take 10 mg by mouth daily as needed.      clopidogrel (PLAVIX) 75 MG tablet Take 1 tablet (75 mg total) by mouth daily. 90 tablet 1   famotidine (PEPCID) 20 MG tablet Take 1 tablet (20 mg total) by mouth at bedtime. 30 tablet 3   finasteride (PROSCAR) 5 MG tablet TAKES 1/4 TABLET IN PM     isosorbide mononitrate (IMDUR) 30 MG 24 hr tablet TAKE 1 TABLET EVERY DAY 90 tablet 1   meclizine (ANTIVERT) 12.5 MG tablet Take 1 tablet (12.5 mg total) by mouth 3 (three) times daily as needed for dizziness. 30 tablet 0   metoprolol succinate (TOPROL-XL) 25 MG 24 hr tablet TAKE 1/2 TABLET EVERY DAY (Patient taking differently: every evening.) 45 tablet 3   OVER THE  COUNTER MEDICATION Vitamin d 3 1 daily     rosuvastatin (CRESTOR) 20 MG tablet TAKE 1 TABLET EVERY DAY (Patient taking differently: every evening.) 90 tablet 3   tamsulosin (FLOMAX) 0.4 MG CAPS capsule Take 0.4 mg by mouth daily after supper.     TURMERIC PO Take 2 capsules by mouth daily. (Patient not taking: Reported on 10/19/2020)     UNABLE TO FIND Med Name: IMMODIUM PRN     Current Facility-Administered Medications  Medication Dose Route Frequency Provider Last Rate Last Admin   0.9 %  sodium chloride infusion  500 mL Intravenous Continuous Milus Banister, MD         Past Surgical History:  Procedure Laterality Date   BACK SURGERY  1997   UPPER CERVICAL C 3 TO C 4 FUSION   CARDIAC CATHETERIZATION     stents x2   CARDIAC CATHETERIZATION N/A 10/02/2014   Procedure: Left Heart Cath And Coronary Angiography;  Surgeon: Larey Dresser, MD;  Location: [place] [facility];  Service: Cardiovascular;  Laterality: N/A;   CARDIAC CATHETERIZATION N/A 02/17/2016   Procedure: Left Heart Cath and Coronary Angiography;  Surgeon: Larey Dresser, MD;  Location: [place] CV [facility];  Service: Cardiovascular;  Laterality: N/A;   CORONARY ARTERY BYPASS GRAFT N/A 03/01/2016   Procedure: CORONARY ARTERY BYPASS GRAFTING (CABG)x3 with endoscopic harvesting of right saphenous vein -LIMA to LAD -SVG to LEFT CIRCUMFLEX -RADIAL ARTERY to RAMUS INTERMEDIA;  Surgeon: Grace Isaac, MD;  Location: [place];  Service: Open Heart Surgery;  Laterality: N/A;   CORONARY STENT PLACEMENT  2005 AND REAPLCED 2006, 2014   CYSTOSCOPY/RETROGRADE/URETEROSCOPY/STONE EXTRACTION WITH BASKET Bilateral 08/07/2020   Procedure: CYSTOSCOPY/RETROGRADE/URETEROSCOPY, HOLMIUM LASER LITHOTRIPSY /STONE EXTRACTION WITH BASKET/ LEFT STENT PLACEMENT;  Surgeon: [name], MD;  Location: [facility];  Service: Urology;  Laterality: Bilateral;   CYSTOSCOPY/URETEROSCOPY/HOLMIUM LASER/STENT PLACEMENT Bilateral 05/29/2018    Procedure: CYSTOSCOPY/RETROGRADE/URETEROSCOPY/HOLMIUM LASER/STENT PLACEMENT/ BASKET STONE EXTRACTION;  Surgeon: [name], MD;  Location: [facility];  Service: Urology;  Laterality: Bilateral;   EYE EXAMINATION UNDER ANESTHESIA W/ RETINAL CRYOTHERAPY AND RETINAL LASER Right 2018   HAND SURGERY Left 1990'S   hamick bone rem   HOLMIUM LASER APPLICATION Bilateral 06/01/7251   Procedure: HOLMIUM LASER APPLICATION;  Surgeon: [name], MD;  Location: [facility];  Service: Urology;  Laterality: Bilateral;   KNEE ARTHROSCOPY Right 10/24/2014   Procedure:  ARTHROSCOPY RIGHT KNEE, Partial medial menisectomy and chondroplasty;  Surgeon: Melrose Nakayama, MD;  Location: [place];  Service: Orthopedics;  Laterality: Right;   LEFT HEART CATH N/A 10/11/2012   Procedure: LEFT HEART CATH;  Surgeon: Sherren Mocha, MD;  Location: [facility] CATH LAB;  Service: Cardiovascular;  Laterality: N/A;   LITHOTRIPSY  EARLY 2000'S   X 2 OR 3   RADIAL ARTERY HARVEST Left 03/01/2016   Procedure: LEFT RADIAL ARTERY HARVEST;  Surgeon: Grace Isaac, MD;  Location: [place];  Service: Open Heart Surgery;  Laterality: Left;   SPLIT NIGHT STUDY  09/21/2015   TEE WITHOUT CARDIOVERSION N/A 03/01/2016   Procedure: TRANSESOPHAGEAL ECHOCARDIOGRAM (TEE);  Surgeon: Grace Isaac, MD;  Location: [place];  Service: Open Heart Surgery;  Laterality: N/A;   TONSILLECTOMY  AS CHILD     Allergies  Allergen Reactions   Altace [Ramipril] Shortness Of Breath and Other (See Comments)    cough   Penicillins Swelling    Has patient had a PCN reaction causing immediate rash, facial/tongue/throat swelling, SOB or lightheadedness with hypotension:unsure Has patient had a PCN reaction causing severe rash involving mucus membranes or skin necrosis:unsure Has patient had a PCN reaction that required hospitalization:No Has patient had a PCN reaction occurring within the last 10 years:NO If all of the above answers  are "NO", then may proceed with Cephalosporin use.  Childhood reaction        Family History  Problem Relation Age of Onset   Heart disease Mother    Stroke Father    Healthy Brother    Colon cancer Neg Hx    Esophageal cancer Neg Hx    Rectal cancer Neg Hx    Stomach cancer Neg Hx      Social History Hayden Rivera reports that he quit smoking about 18 years ago. His smoking use included cigars and cigarettes. He has a 5.00 pack-year smoking history. He has never used smokeless tobacco. Hayden Rivera reports previous alcohol use.   Review of Systems CONSTITUTIONAL: No weight loss, fever, chills, weakness or fatigue.  HEENT: Eyes: No visual loss, blurred vision, double vision or yellow sclerae.No hearing loss, sneezing, congestion, runny nose or sore throat.  SKIN: No rash or itching.  CARDIOVASCULAR: per hpi RESPIRATORY: No shortness of breath, cough or sputum.  GASTROINTESTINAL: No anorexia, nausea, vomiting or diarrhea. No abdominal pain or blood.  GENITOURINARY: No burning on urination, no polyuria NEUROLOGICAL: No headache, dizziness, syncope, paralysis, ataxia, numbness or tingling in the extremities. No change in bowel or bladder control.  MUSCULOSKELETAL: No muscle, back pain, joint pain or stiffness.  LYMPHATICS: No enlarged nodes. No history of splenectomy.  PSYCHIATRIC: No history of depression or anxiety.  ENDOCRINOLOGIC: No reports of sweating, cold or heat intolerance. No polyuria or polydipsia.  Marland Kitchen   Physical Examination Today's Vitals   11/18/20 0940  BP: 134/76  Pulse: 60  Weight: 238 lb (108 kg)  Height: 5\' 11"  (1.803 m)   Body mass index is 33.19 kg/m.  Gen: resting comfortably, no acute distress HEENT: no scleral icterus, pupils equal round and reactive, no palptable cervical adenopathy,  CV: RRR, 2/6 systolic murmur rusb,no jvd Resp: Clear to auscultation bilaterally GI: abdomen is soft, non-tender, non-distended, normal bowel sounds, no  hepatosplenomegaly MSK: extremities are warm, no edema.  Skin: warm, no rash Neuro:  no focal deficits Psych: appropriate affect   Diagnostic Studies 08/2017 nuclear stress Nuclear stress EF: 48%. Apical and anteroseptal wall hypokinesis There was no ST  segment deviation noted during stress. Defect 1: There is a large defect of severe severity present in the mid anteroseptal, mid inferoseptal, mid inferior, mid inferolateral, apical anterior, apical septal, apical inferior, apical lateral and apex location. This is an intermediate risk study. Appears to be large prior infarct pattern as described above. No ischemia identified.   Jan 2019 echo Study Conclusions   - Left ventricle: The cavity size was normal. Wall thickness was   increased in a pattern of mild LVH. There was mild focal basal   hypertrophy of the septum. Systolic function was mildly reduced.   The estimated ejection fraction was in the range of 45% to 50%.   There is akinesis of the mid-apicalanteroseptal myocardium.   Features are consistent with a pseudonormal left ventricular   filling pattern, with concomitant abnormal relaxation and   increased filling pressure (grade 2 diastolic dysfunction). - Aortic valve: Valve mobility was restricted. There was mild   stenosis. There was trivial regurgitation. - Ascending aorta: The ascending aorta was mildly dilated. - Pulmonary arteries: Systolic pressure was mildly increased. PA   peak pressure: 36 mm Hg (S).   Impressions:   - Akinesis of the distal septum/apex; overall mildly reduced LV   systolic function; moderate diastolic dysfunction; calcified   aortic valve with fixed noncoronary cusp; mild AS (mean gradient   12 mmHg) and trace AI; milldy dilated ascending aorta; mild TR   with mildly elevated pulmonary pressure      Assessment and Plan  1. CAD/ ICM - committed to indefinite DAPT due to long history of significant disease and interventions -no  symptoms - had not been on ARB due to soft bp's in the past. With weight gain SBPs have trended up and room to start, he is reluctant at this time but will let us know, we did discuss in detail the cardiovasacular benefits of ARB. Would start losartan 12.5mg  daily if he agrees in the future  EKG today SR, no ischemic changes.    2. Aortic stenosis - mild by echo -we will repeat echo for surveillance.    3. HTN - slightly above goal, he will let us know if he decides to start losartan 12.5mg     4. Hyperlipidemia - LDL at goal, high TGs that should improve with improved control of his DM2 - request pcp labs    F/u 6 months  Arnoldo Lenis, M.D.

## 2020-12-04 ENCOUNTER — Other Ambulatory Visit: Payer: Self-pay | Admitting: Cardiology

## 2020-12-04 DIAGNOSIS — I480 Paroxysmal atrial fibrillation: Secondary | ICD-10-CM

## 2020-12-04 DIAGNOSIS — Z951 Presence of aortocoronary bypass graft: Secondary | ICD-10-CM

## 2020-12-04 DIAGNOSIS — I10 Essential (primary) hypertension: Secondary | ICD-10-CM

## 2020-12-23 ENCOUNTER — Other Ambulatory Visit: Payer: Medicare HMO

## 2021-01-01 ENCOUNTER — Ambulatory Visit (HOSPITAL_COMMUNITY)
Admission: RE | Admit: 2021-01-01 | Discharge: 2021-01-01 | Disposition: A | Discharge status: Home / Self Care | Payer: Medicare HMO | Source: Ambulatory Visit | Attending: Cardiology | Admitting: Cardiology

## 2021-01-01 ENCOUNTER — Other Ambulatory Visit: Payer: Self-pay

## 2021-01-01 DIAGNOSIS — I350 Nonrheumatic aortic (valve) stenosis: Secondary | ICD-10-CM | POA: Diagnosis not present

## 2021-01-01 DIAGNOSIS — I351 Nonrheumatic aortic (valve) insufficiency: Secondary | ICD-10-CM

## 2021-01-01 LAB — ECHOCARDIOGRAM COMPLETE
AR max vel: 2.66 cm2
AV Area VTI: 2.88 cm2
AV Area mean vel: 3.01 cm2
AV Mean grad: 7 mmHg
AV Peak grad: 16 mmHg
Ao pk vel: 2 m/s
Area-P 1/2: 2.94 cm2
S' Lateral: 3.2 cm

## 2021-01-01 NOTE — Progress Notes (Signed)
*  PRELIMINARY RESULTS* Echocardiogram 2D Echocardiogram has been performed.  Hayden Rivera 01/01/2021, 3:04 PM

## 2021-01-12 ENCOUNTER — Telehealth: Payer: Self-pay | Admitting: *Deleted

## 2021-01-12 NOTE — Telephone Encounter (Signed)
Laurine Blazer, LPN  [phone] [id number] PM EDT Back to Top    Patient notified via detailed voice message.  Copy to pcp.     Laurine Blazer, LPN  [id number]  [id number] PM EDT     Left message to return call.   Arnoldo Lenis, MD  01/11/2021  3:00 PM EDT     Echo looks fine, no significant changes   JBrancH MD

## 2021-02-10 ENCOUNTER — Other Ambulatory Visit: Payer: Self-pay | Admitting: Cardiology

## 2021-02-10 ENCOUNTER — Other Ambulatory Visit: Payer: Self-pay | Admitting: *Deleted

## 2021-02-10 MED ORDER — CLOPIDOGREL BISULFATE 75 MG PO TABS: 75.0000 mg | ORAL_TABLET | Freq: Every day | ORAL | 3 refills | Status: DC

## 2021-04-06 ENCOUNTER — Telehealth: Payer: Self-pay | Admitting: Nurse Practitioner

## 2021-04-06 ENCOUNTER — Encounter: Payer: Self-pay | Admitting: Nurse Practitioner

## 2021-04-06 ENCOUNTER — Other Ambulatory Visit: Payer: Self-pay | Admitting: *Deleted

## 2021-04-06 ENCOUNTER — Ambulatory Visit (INDEPENDENT_AMBULATORY_CARE_PROVIDER_SITE_OTHER): Payer: Medicare HMO | Admitting: Nurse Practitioner

## 2021-04-06 DIAGNOSIS — R053 Chronic cough: Secondary | ICD-10-CM

## 2021-04-06 MED ORDER — AZITHROMYCIN 250 MG PO TABS: ORAL_TABLET | ORAL | 0 refills | Status: DC

## 2021-04-06 MED ORDER — PREDNISONE 10 MG (21) PO TBPK: ORAL_TABLET | ORAL | 0 refills | Status: DC

## 2021-04-06 NOTE — Patient Instructions (Signed)

## 2021-04-06 NOTE — Progress Notes (Signed)
   Virtual Visit  Note Due to COVID-19 pandemic this visit was conducted virtually. This visit type was conducted due to national recommendations for restrictions regarding the COVID-19 Pandemic (e.g. social distancing, sheltering in place) in an effort to limit this patient's exposure and mitigate transmission in our community. All issues noted in this document were discussed and addressed.  A physical exam was not performed with this format.  I connected with Hayden Rivera on 04/06/21 at 12:59 by telephone and verified that I am speaking with the correct person using two identifiers. Hayden Rivera is currently located at home and no one is currently with him during visit. The provider, Mary-Margaret Hassell Done, FNP is located in their office at time of visit.  I discussed the limitations, risks, security and privacy concerns of performing an evaluation and management service by telephone and the availability of in person appointments. I also discussed with the patient that there may be a patient responsible charge related to this service. The patient expressed understanding and agreed to proceed.   History and Present Illness:  Patient states that he has a constant cough. Intermitent for 3-4 months and now is all the time.he has used OTC cough meds and that has not helped the last several days. He had this in 2018, where cough just would not resolve and had to see specialist. They tried several things. According bto office note on 06/10/16 he was given z pak and prednisone   Review of Systems  Constitutional:  Negative for chills and fever.  HENT:  Positive for congestion.   Respiratory:  Positive for cough and wheezing. Negative for sputum production and shortness of breath.   Musculoskeletal:  Negative for myalgias.  Neurological:  Negative for dizziness.    Observations/Objective: Alert and oriented- answers all questions appropriately No distress Dry cough noted during visit  Assessment  and Plan: Hayden Rivera in today with chief complaint of No chief complaint on file.   1. Persistent cough for 3 weeks or longer Force fluids Humidifier Delsym OTC for cough - azithromycin (ZITHROMAX Z-PAK) 250 MG tablet; As directed  Dispense: 6 tablet; Refill: 0 - predniSONE (STERAPRED UNI-PAK 21 TAB) 10 MG (21) TBPK tablet; As directed x 6 days  Dispense: 21 tablet; Refill: 0   Follow Up Instructions: prn    I discussed the assessment and treatment plan with the patient. The patient was provided an opportunity to ask questions and all were answered. The patient agreed with the plan and demonstrated an understanding of the instructions.   The patient was advised to call back or seek an in-person evaluation if the symptoms worsen or if the condition fails to improve as anticipated.  The above assessment and management plan was discussed with the patient. The patient verbalized understanding of and has agreed to the management plan. Patient is aware to call the clinic if symptoms persist or worsen. Patient is aware when to return to the clinic for a follow-up visit. Patient educated on when it is appropriate to go to the emergency department.   Time call ended:  1:12  I provided 12 minutes of  non face-to-face time during this encounter.    Mary-Margaret Hassell Done, FNP

## 2021-04-06 NOTE — Telephone Encounter (Signed)
Rxs have been sent to [facility] and message left for patient.

## 2021-04-06 NOTE — Telephone Encounter (Signed)
Done - pharm fixed and pt aware

## 2021-05-02 ENCOUNTER — Other Ambulatory Visit: Payer: Self-pay | Admitting: Cardiology

## 2021-05-26 ENCOUNTER — Other Ambulatory Visit: Payer: Self-pay

## 2021-05-26 ENCOUNTER — Encounter: Payer: Self-pay | Admitting: Cardiology

## 2021-05-26 ENCOUNTER — Ambulatory Visit (INDEPENDENT_AMBULATORY_CARE_PROVIDER_SITE_OTHER): Payer: Medicare HMO | Admitting: Cardiology

## 2021-05-26 ENCOUNTER — Telehealth: Payer: Self-pay | Admitting: Nurse Practitioner

## 2021-05-26 VITALS — BP 144/80 | HR 57 | Ht 71.0 in | Wt 249.8 lb

## 2021-05-26 DIAGNOSIS — I2510 Atherosclerotic heart disease of native coronary artery without angina pectoris: Secondary | ICD-10-CM | POA: Diagnosis not present

## 2021-05-26 DIAGNOSIS — R079 Chest pain, unspecified: Secondary | ICD-10-CM

## 2021-05-26 DIAGNOSIS — R0789 Other chest pain: Secondary | ICD-10-CM | POA: Diagnosis not present

## 2021-05-26 DIAGNOSIS — I513 Intracardiac thrombosis, not elsewhere classified: Secondary | ICD-10-CM | POA: Diagnosis not present

## 2021-05-26 DIAGNOSIS — E782 Mixed hyperlipidemia: Secondary | ICD-10-CM | POA: Diagnosis not present

## 2021-05-26 DIAGNOSIS — I350 Nonrheumatic aortic (valve) stenosis: Secondary | ICD-10-CM

## 2021-05-26 DIAGNOSIS — I10 Essential (primary) hypertension: Secondary | ICD-10-CM | POA: Diagnosis not present

## 2021-05-26 MED ORDER — LOSARTAN POTASSIUM 25 MG PO TABS: 25.0000 mg | ORAL_TABLET | Freq: Every day | ORAL | 3 refills | Status: DC

## 2021-05-26 MED ORDER — LOSARTAN POTASSIUM 25 MG PO TABS: 25.0000 mg | ORAL_TABLET | Freq: Every day | ORAL | 0 refills | Status: DC

## 2021-05-26 NOTE — Progress Notes (Signed)
Clinical Summary Hayden Rivera is a 70 y.o.malemaleseen today for follow up of the following medical problems.    1. CAD - multiple PCIs, CABG in 02/2016 08/2017 nuclear stress no ischemia, large prior infarct  - committed to indefinite DAPT due to long history of significant disease and interventions     - occasional left sided jaw pain. Not exertional. No other associated symptoms. Lasts a few minutes. Not positional.  - some mild SOB with activities. No recent edema.  - cannot run on treadmill due to chronic knee pains    2. ICM - Jan 2019 echo LVEF 45-50%, akinesis mid-apicalanteroseptal wall. Grade II diastolic dysfunction - 08/1322 echo LVEF 50-55%, difficult to discern WMAs - no recent LE edema       3. Mild aortic stenosis Jan 2019 mild AS: mean grade 12, AVA VTI 1.81 - no recent symptoms.    12/2020 echo LVEF 50-55%, no significant AS mean grad 7 AVA TI 2.88     4. HTN -cough on ramipril. Previuosly had not wanted to try losartan.  - compliant with meds   5. Hyperlipidemia Jan 2020 TC 131 TG 182 HDL 45 LDL 50 -  labs with [facility] and pcp   07/2019 TC 127 TG 472 LDL direct 39. - he is compliant with meds     6. DM2 - had been on metformin, he reports sugars improved and he reports pcp stopped metformin.      7. Abnormal CT scan - recent CT scan from [place] suggested possible LV apical thrombus.  - with apical akinesis from prior echos certaintly has risk for thrombus   SH: retired Scientist, product/process development     Past Medical History:  Diagnosis Date   Anginal pain ([place])    occ   Aortic stenosis 06/16/2017   Mild, noted on ECHO   Arthritis    Atrial fibrillation ([place]) 02/2016   post op   CAD (coronary artery disease)    a. Anterior STEMI 2005 c/b vfib arrest, PCI to LAD at [facility] b. Stent thrombosis 2006 with DES within prior LAD stent at [facility]. C. 09/2012: s/p balloon angioplasty to mLAD for severe stenosis in previously stented segment & DES to LCx;  initial enz neg but ruled in for NSTEMI after post-cath vagal sx, felt d/t distal emboliz of thrombus during case.   Cancer ([place])    skin ca melanoma, pt states was removed from back   Chronic back pain    Dependent edema    Mild, occ NONE RECENT   Diarrhea    DM type 2 (diabetes mellitus, type 2) (HCC)    diet controlled   Elevated hemidiaphragm    a. Noted 09/2012 - instructed to f/u PCP.   Heart murmur    History of kidney stones    Hyperlipidemia    Hypertension    Ischemic cardiomyopathy    a. Unclear prior EF but pt was told heart was weakened in past. b. EF normal 09/2012.   Myocardial infarction ([place]) 2005   TOTAL MI'S 4 LAST ONE 2017   Retinal tear, right    Ventricular fibrillation ([place])    a. VF arrest 2005 in setting of STEMI.   Wears glasses    Wears glasses    for reading     Allergies  Allergen Reactions   Altace [Ramipril] Shortness Of Breath and Other (See Comments)    cough   Penicillins Swelling    Has patient had a PCN  reaction causing immediate rash, facial/tongue/throat swelling, SOB or lightheadedness with hypotension:unsure Has patient had a PCN reaction causing severe rash involving mucus membranes or skin necrosis:unsure Has patient had a PCN reaction that required hospitalization:No Has patient had a PCN reaction occurring within the last 10 years:NO If all of the above answers are "NO", then may proceed with Cephalosporin use.  Childhood reaction       Current Outpatient Medications  Medication Sig Dispense Refill   acetaminophen (TYLENOL) 500 MG tablet Take 500 mg by mouth every 6 (six) hours as needed (PAIN).      aspirin 81 MG tablet Take 1 tablet (81 mg total) by mouth daily.     azithromycin (ZITHROMAX Z-PAK) 250 MG tablet As directed 6 tablet 0   cetirizine (ZYRTEC) 10 MG tablet Take 10 mg by mouth daily as needed.      clopidogrel (PLAVIX) 75 MG tablet Take 1 tablet (75 mg total) by mouth daily. 90 tablet 3   famotidine (PEPCID) 20  MG tablet Take 1 tablet (20 mg total) by mouth at bedtime. 30 tablet 3   finasteride (PROSCAR) 5 MG tablet TAKES 1/4 TABLET IN PM     isosorbide mononitrate (IMDUR) 30 MG 24 hr tablet TAKE 1 TABLET EVERY DAY 90 tablet 1   meclizine (ANTIVERT) 12.5 MG tablet Take 1 tablet (12.5 mg total) by mouth 3 (three) times daily as needed for dizziness. 30 tablet 0   metoprolol succinate (TOPROL-XL) 25 MG 24 hr tablet TAKE 1/2 TABLET EVERY DAY 45 tablet 3   OVER THE COUNTER MEDICATION Vitamin d 3 1 daily     predniSONE (STERAPRED UNI-PAK 21 TAB) 10 MG (21) TBPK tablet As directed x 6 days 21 tablet 0   rosuvastatin (CRESTOR) 20 MG tablet TAKE 1 TABLET EVERY DAY 90 tablet 1   TURMERIC PO Take 2 capsules by mouth daily.     UNABLE TO FIND Med Name: IMMODIUM PRN     Current Facility-Administered Medications  Medication Dose Route Frequency Provider Last Rate Last Admin   0.9 %  sodium chloride infusion  500 mL Intravenous Continuous Milus Banister, MD         Past Surgical History:  Procedure Laterality Date   BACK SURGERY  1997   UPPER CERVICAL C 3 TO C 4 FUSION   CARDIAC CATHETERIZATION     stents x2   CARDIAC CATHETERIZATION N/A 10/02/2014   Procedure: Left Heart Cath And Coronary Angiography;  Surgeon: Larey Dresser, MD;  Location: [place] [facility];  Service: Cardiovascular;  Laterality: N/A;   CARDIAC CATHETERIZATION N/A 02/17/2016   Procedure: Left Heart Cath and Coronary Angiography;  Surgeon: Larey Dresser, MD;  Location: [place] [facility];  Service: Cardiovascular;  Laterality: N/A;   CORONARY ARTERY BYPASS GRAFT N/A 03/01/2016   Procedure: CORONARY ARTERY BYPASS GRAFTING (CABG)x3 with endoscopic harvesting of right saphenous vein -LIMA to LAD -SVG to LEFT CIRCUMFLEX -RADIAL ARTERY to RAMUS INTERMEDIA;  Surgeon: Grace Isaac, MD;  Location: [place];  Service: Open Heart Surgery;  Laterality: N/A;   CORONARY STENT PLACEMENT  2005 AND REAPLCED 2006, 2014    CYSTOSCOPY/RETROGRADE/URETEROSCOPY/STONE EXTRACTION WITH BASKET Bilateral 08/07/2020   Procedure: CYSTOSCOPY/RETROGRADE/URETEROSCOPY, HOLMIUM LASER LITHOTRIPSY /STONE EXTRACTION WITH BASKET/ LEFT STENT PLACEMENT;  Surgeon: [name], MD;  Location: [facility];  Service: Urology;  Laterality: Bilateral;   CYSTOSCOPY/URETEROSCOPY/HOLMIUM LASER/STENT PLACEMENT Bilateral 05/29/2018   Procedure: CYSTOSCOPY/RETROGRADE/URETEROSCOPY/HOLMIUM LASER/STENT PLACEMENT/ BASKET STONE EXTRACTION;  Surgeon: [name], MD;  Location: [place];  Service: Urology;  Laterality: Bilateral;   EYE EXAMINATION UNDER ANESTHESIA W/ RETINAL CRYOTHERAPY AND RETINAL LASER Right 2018   HAND SURGERY Left 1990'S   hamick bone rem   HOLMIUM LASER APPLICATION Bilateral 9/51/8841   Procedure: HOLMIUM LASER APPLICATION;  Surgeon: [name], MD;  Location: [facility];  Service: Urology;  Laterality: Bilateral;   KNEE ARTHROSCOPY Right 10/24/2014   Procedure: ARTHROSCOPY RIGHT KNEE, Partial medial menisectomy and chondroplasty;  Surgeon: Melrose Nakayama, MD;  Location: [facility];  Service: Orthopedics;  Laterality: Right;   LEFT HEART CATH N/A 10/11/2012   Procedure: LEFT HEART CATH;  Surgeon: Sherren Mocha, MD;  Location: [facility] CATH LAB;  Service: Cardiovascular;  Laterality: N/A;   LITHOTRIPSY  EARLY 2000'S   X 2 OR 3   RADIAL ARTERY HARVEST Left 03/01/2016   Procedure: LEFT RADIAL ARTERY HARVEST;  Surgeon: Grace Isaac, MD;  Location: [place];  Service: Open Heart Surgery;  Laterality: Left;   SPLIT NIGHT STUDY  09/21/2015   TEE WITHOUT CARDIOVERSION N/A 03/01/2016   Procedure: TRANSESOPHAGEAL ECHOCARDIOGRAM (TEE);  Surgeon: Grace Isaac, MD;  Location: [place];  Service: Open Heart Surgery;  Laterality: N/A;   TONSILLECTOMY  AS CHILD     Allergies  Allergen Reactions   Altace [Ramipril] Shortness Of Breath and Other (See Comments)    cough   Penicillins  Swelling    Has patient had a PCN reaction causing immediate rash, facial/tongue/throat swelling, SOB or lightheadedness with hypotension:unsure Has patient had a PCN reaction causing severe rash involving mucus membranes or skin necrosis:unsure Has patient had a PCN reaction that required hospitalization:No Has patient had a PCN reaction occurring within the last 10 years:NO If all of the above answers are "NO", then may proceed with Cephalosporin use.  Childhood reaction        Family History  Problem Relation Age of Onset   Heart disease Mother    Stroke Father    Healthy Brother    Colon cancer Neg Hx    Esophageal cancer Neg Hx    Rectal cancer Neg Hx    Stomach cancer Neg Hx      Social History Mr. Catala reports that he quit smoking about 19 years ago. His smoking use included cigars and cigarettes. He has a 5.00 pack-year smoking history. He has never used smokeless tobacco. Mr. Yaffe reports that he does not currently use alcohol.   Review of Systems CONSTITUTIONAL: No weight loss, fever, chills, weakness or fatigue.  HEENT: Eyes: No visual loss, blurred vision, double vision or yellow sclerae.No hearing loss, sneezing, congestion, runny nose or sore throat.  SKIN: No rash or itching.  CARDIOVASCULAR: per hpi RESPIRATORY: No shortness of breath, cough or sputum.  GASTROINTESTINAL: No anorexia, nausea, vomiting or diarrhea. No abdominal pain or blood.  GENITOURINARY: No burning on urination, no polyuria NEUROLOGICAL: No headache, dizziness, syncope, paralysis, ataxia, numbness or tingling in the extremities. No change in bowel or bladder control.  MUSCULOSKELETAL: No muscle, back pain, joint pain or stiffness.  LYMPHATICS: No enlarged nodes. No history of splenectomy.  PSYCHIATRIC: No history of depression or anxiety.  ENDOCRINOLOGIC: No reports of sweating, cold or heat intolerance. No polyuria or polydipsia.  Marland Kitchen   Physical Examination Today's Vitals    05/26/21 0811  BP: (!) 144/80  Pulse: (!) 57  SpO2: 93%  Weight: 249 lb 12.8 oz (113.3 kg)  Height: 5\' 11"  (1.803 m)   Body mass index  is 34.84 kg/m.  Gen: resting comfortably, no acute distress HEENT: no scleral icterus, pupils equal round and reactive, no palptable cervical adenopathy,  CV: RRR, 2/6 systolic murmur rusb, no jvd Resp: Clear to auscultation bilaterally GI: abdomen is soft, non-tender, non-distended, normal bowel sounds, no hepatosplenomegaly MSK: extremities are warm, no edema.  Skin: warm, no rash Neuro:  no focal deficits Psych: appropriate affect   Diagnostic Studies  08/2017 nuclear stress Nuclear stress EF: 48%. Apical and anteroseptal wall hypokinesis There was no ST segment deviation noted during stress. Defect 1: There is a large defect of severe severity present in the mid anteroseptal, mid inferoseptal, mid inferior, mid inferolateral, apical anterior, apical septal, apical inferior, apical lateral and apex location. This is an intermediate risk study. Appears to be large prior infarct pattern as described above. No ischemia identified.   Jan 2019 echo Study Conclusions   - Left ventricle: The cavity size was normal. Wall thickness was   increased in a pattern of mild LVH. There was mild focal basal   hypertrophy of the septum. Systolic function was mildly reduced.   The estimated ejection fraction was in the range of 45% to 50%.   There is akinesis of the mid-apicalanteroseptal myocardium.   Features are consistent with a pseudonormal left ventricular   filling pattern, with concomitant abnormal relaxation and   increased filling pressure (grade 2 diastolic dysfunction). - Aortic valve: Valve mobility was restricted. There was mild   stenosis. There was trivial regurgitation. - Ascending aorta: The ascending aorta was mildly dilated. - Pulmonary arteries: Systolic pressure was mildly increased. PA   peak pressure: 36 mm Hg (S).   Impressions:    - Akinesis of the distal septum/apex; overall mildly reduced LV   systolic function; moderate diastolic dysfunction; calcified   aortic valve with fixed noncoronary cusp; mild AS (mean gradient   12 mmHg) and trace AI; milldy dilated ascending aorta; mild TR   with mildly elevated pulmonary pressure  12/2020 echo 1. Endocardium poorly visualized, apex appears to be hypokinetic. Marland Kitchen Left  ventricular ejection fraction, by estimation, is 50 to 55%. The left  ventricle has low normal function. Left ventricular endocardial border not  optimally defined to evaluate  regional wall motion. There is severe left ventricular hypertrophy. Left  ventricular diastolic parameters are indeterminate.   2. Right ventricular systolic function is normal. The right ventricular  size is normal.   3. Left atrial size was moderately dilated.   4. The mitral valve is normal in structure. No evidence of mitral valve  regurgitation. No evidence of mitral stenosis.   5. The aortic valve is tricuspid. Aortic valve regurgitation is mild. No  aortic stenosis is present.   6. The inferior vena cava is normal in size with greater than 50%  respiratory variability, suggesting right atrial pressure of 3 mmHg.  Assessment and Plan  1. CAD/ ICM - committed to indefinite DAPT due to long history of significant disease and interventions - recent left jaw pain and some SOB/DOE, no specific chest pain - given extensive history will order lexsican to evlauate for ischemia   2. Aortic stenosis -very mild by echo without any progression from recent study, continue to monitor   3. HTN - above goal, will start losartan 25mg  daily and    4. Hyperlipidemia -continue current meds, LDL has been at goal. TGs should improve with DM2 control  5. Possible LV thrombus - incidental finding by CT abd/pelvis at [facility], suggesting possible apical  thrombus. Certaintly with apical akinesis from prior echos at risk for, plan for limited echo  with contrast to confirm. If present would need to start anticoagulation.         Arnoldo Lenis, M.D.

## 2021-05-26 NOTE — Patient Instructions (Signed)
Medication Instructions:  Begin Losartan 25mg  daily. Continue all other medications.     Labwork: BMET - order given today Please do in 2 weeks (around 06/09/2021)  Testing/Procedures: Your physician has requested that you have a limited echocardiogram with contrast. Echocardiography is a painless test that uses sound waves to create images of your heart. It provides your doctor with information about the size and shape of your heart and how well your hearts chambers and valves are working. This procedure takes approximately one hour. There are no restrictions for this procedure. Your physician has requested that you have a lexiscan myoview. For further information please visit [email or web address]. Please follow instruction sheet, as given.  Follow-Up: Office will contact with results via phone or letter.   6 months   Any Other Special Instructions Will Be Listed Below (If Applicable).   If you need a refill on your cardiac medications before your next appointment, please call your pharmacy.

## 2021-05-27 NOTE — Addendum Note (Signed)
Addended by: Laurine Blazer on: 05/27/2021 02:01 PM   Modules accepted: Orders

## 2021-05-27 NOTE — Addendum Note (Signed)
Addended by: Laurine Blazer on: 05/27/2021 09:19 AM   Modules accepted: Orders

## 2021-05-27 NOTE — Addendum Note (Signed)
Addended by: Laurine Blazer on: 05/27/2021 01:35 PM   Modules accepted: Orders

## 2021-05-28 ENCOUNTER — Encounter: Payer: Self-pay | Admitting: Cardiology

## 2021-05-28 NOTE — Addendum Note (Signed)
Addended by: Laurine Blazer on: 05/28/2021 01:31 PM   Modules accepted: Orders

## 2021-06-01 ENCOUNTER — Ambulatory Visit (INDEPENDENT_AMBULATORY_CARE_PROVIDER_SITE_OTHER): Payer: Medicare HMO

## 2021-06-01 VITALS — Ht 71.0 in | Wt 245.0 lb

## 2021-06-01 DIAGNOSIS — M47812 Spondylosis without myelopathy or radiculopathy, cervical region: Secondary | ICD-10-CM | POA: Insufficient documentation

## 2021-06-01 DIAGNOSIS — Z0000 Encounter for general adult medical examination without abnormal findings: Secondary | ICD-10-CM | POA: Diagnosis not present

## 2021-06-01 NOTE — Progress Notes (Signed)
Subjective:   Hayden Rivera is a 71 y.o. male who presents for an Initial Medicare Annual Wellness Visit.  Virtual Visit via Telephone Note  I connected with  Hayden Rivera on 06/01/21 at 10:30 AM EST by telephone and verified that I am speaking with the correct person using two identifiers.  Location: Patient: Home Provider: [facility] Persons participating in the virtual visit: patient/Nurse Health Advisor   I discussed the limitations, risks, security and privacy concerns of performing an evaluation and management service by telephone and the availability of in person appointments. The patient expressed understanding and agreed to proceed.  Interactive audio and video telecommunications were attempted between this nurse and patient, however failed, due to patient having technical difficulties OR patient did not have access to video capability.  We continued and completed visit with audio only.  Some vital signs may be absent or patient reported.   Kallan Bischoff E Effie Wahlert, LPN   Review of Systems     Cardiac Risk Factors include: advanced age (>71men, >51 women);male gender;family history of premature cardiovascular disease;obesity (BMI >30kg/m2);sedentary lifestyle;hypertension;dyslipidemia;smoking/ tobacco exposure;Other (see comment), Risk factor comments: CAD, hx of MI, hx of CABG, atrial fibrillation     Objective:    Today's Vitals   06/01/21 1038  Weight: 245 lb (111.1 kg)  Height: 5\' 11"  (1.803 m)   Body mass index is 34.17 kg/m.  Advanced Directives 06/01/2021 08/07/2020 03/26/2020 05/29/2018 02/29/2016 02/17/2016 09/21/2015  Does Patient Have a Medical Advance Directive? No No No No No No No  Would patient like information on creating a medical advance directive? No - Patient declined No - Patient declined No - Patient declined No - Patient declined - Yes - Educational materials given No - patient declined information  Pre-existing out of facility DNR order (yellow form or pink MOST  form) - - - - - - -    Current Medications (verified) Outpatient Encounter Medications as of 06/01/2021  Medication Sig   acetaminophen (TYLENOL) 500 MG tablet Take 500 mg by mouth every 6 (six) hours as needed (PAIN).    aspirin 81 MG tablet Take 1 tablet (81 mg total) by mouth daily.   cetirizine (ZYRTEC) 10 MG tablet Take 10 mg by mouth daily as needed.    clopidogrel (PLAVIX) 75 MG tablet Take 1 tablet (75 mg total) by mouth daily.   famotidine (PEPCID) 20 MG tablet Take 1 tablet (20 mg total) by mouth at bedtime.   finasteride (PROSCAR) 5 MG tablet TAKES 1/4 TABLET IN PM   isosorbide mononitrate (IMDUR) 30 MG 24 hr tablet TAKE 1 TABLET EVERY DAY   losartan (COZAAR) 25 MG tablet Take 1 tablet (25 mg total) by mouth daily.   metoprolol succinate (TOPROL-XL) 25 MG 24 hr tablet TAKE 1/2 TABLET EVERY DAY   OVER THE COUNTER MEDICATION Vitamin d 3 1 daily   rosuvastatin (CRESTOR) 20 MG tablet TAKE 1 TABLET EVERY DAY   TURMERIC PO Take 2 capsules by mouth daily.   UNABLE TO FIND Med Name: IMMODIUM PRN   azithromycin (ZITHROMAX Z-PAK) 250 MG tablet As directed (Patient not taking: Reported on 06/01/2021)   meclizine (ANTIVERT) 12.5 MG tablet Take 1 tablet (12.5 mg total) by mouth 3 (three) times daily as needed for dizziness. (Patient not taking: Reported on 06/01/2021)   minoxidil (LONITEN) 2.5 MG tablet Take 2.5 mg by mouth daily. (Patient not taking: Reported on 06/01/2021)   predniSONE (STERAPRED UNI-PAK 21 TAB) 10 MG (21) TBPK tablet As directed x  6 days (Patient not taking: Reported on 06/01/2021)   Facility-Administered Encounter Medications as of 06/01/2021  Medication   0.9 %  sodium chloride infusion    Allergies (verified) Altace [ramipril] and Penicillins   History: Past Medical History:  Diagnosis Date   Anginal pain ([place])    occ   Aortic stenosis 06/16/2017   Mild, noted on ECHO   Arthritis    Atrial fibrillation ([place]) 02/2016   post op   CAD (coronary artery disease)    a.  Anterior STEMI 2005 c/b vfib arrest, PCI to LAD at [facility]. b. Stent thrombosis 2006 with DES within prior LAD stent at [facility]. C. 09/2012: s/p balloon angioplasty to mLAD for severe stenosis in previously stented segment & DES to LCx; initial enz neg but ruled in for NSTEMI after post-cath vagal sx, felt d/t distal emboliz of thrombus during case.   Cancer ([place])    skin ca melanoma, pt states was removed from back   Chronic back pain    Dependent edema    Mild, occ NONE RECENT   Diarrhea    DM type 2 (diabetes mellitus, type 2) (HCC)    diet controlled   Elevated hemidiaphragm    a. Noted 09/2012 - instructed to f/u PCP.   Heart murmur    History of kidney stones    Hyperlipidemia    Hypertension    Ischemic cardiomyopathy    a. Unclear prior EF but pt was told heart was weakened in past. b. EF normal 09/2012.   Myocardial infarction ([place]) 2005   TOTAL MI'S 4 LAST ONE 2017   Retinal tear, right    Ventricular fibrillation ([place])    a. VF arrest 2005 in setting of STEMI.   Wears glasses    Wears glasses    for reading   Past Surgical History:  Procedure Laterality Date   [place] 3 TO C 4 FUSION   CARDIAC CATHETERIZATION     stents x2   CARDIAC CATHETERIZATION N/A 10/02/2014   Procedure: Left Heart Cath And Coronary Angiography;  Surgeon: Larey Dresser, MD;  Location: [place] INVASIVE CV LAB CUPID;  Service: Cardiovascular;  Laterality: N/A;   CARDIAC CATHETERIZATION N/A 02/17/2016   Procedure: Left Heart Cath and Coronary Angiography;  Surgeon: Larey Dresser, MD;  Location: [place] CV LAB;  Service: Cardiovascular;  Laterality: N/A;   CORONARY ARTERY BYPASS GRAFT N/A 03/01/2016   Procedure: CORONARY ARTERY BYPASS GRAFTING (CABG)x3 with endoscopic harvesting of right saphenous vein -LIMA to LAD -SVG to LEFT CIRCUMFLEX -RADIAL ARTERY to RAMUS INTERMEDIA;  Surgeon: Grace Isaac, MD;  Location: [place];  Service: Open Heart Surgery;  Laterality: N/A;    CORONARY STENT PLACEMENT  2005 AND REAPLCED 2006, 2014   CYSTOSCOPY/RETROGRADE/URETEROSCOPY/STONE EXTRACTION WITH BASKET Bilateral 08/07/2020   Procedure: CYSTOSCOPY/RETROGRADE/URETEROSCOPY, HOLMIUM LASER LITHOTRIPSY /STONE EXTRACTION WITH BASKET/ LEFT STENT PLACEMENT;  Surgeon: [name], MD;  Location: [facility];  Service: Urology;  Laterality: Bilateral;   CYSTOSCOPY/URETEROSCOPY/HOLMIUM LASER/STENT PLACEMENT Bilateral 05/29/2018   Procedure: CYSTOSCOPY/RETROGRADE/URETEROSCOPY/HOLMIUM LASER/STENT PLACEMENT/ BASKET STONE EXTRACTION;  Surgeon: [name], MD;  Location: [facility];  Service: Urology;  Laterality: Bilateral;   EYE EXAMINATION UNDER ANESTHESIA W/ RETINAL CRYOTHERAPY AND RETINAL LASER Right 2018   HAND SURGERY Left 1990'S   hamick bone rem   HOLMIUM LASER APPLICATION Bilateral 2/63/7858   Procedure: HOLMIUM LASER APPLICATION;  Surgeon: [name], MD;  Location: [facility];  Service: Urology;  Laterality: Bilateral;   KNEE ARTHROSCOPY Right 10/24/2014   Procedure: ARTHROSCOPY RIGHT KNEE, Partial medial menisectomy and chondroplasty;  Surgeon: Melrose Nakayama, MD;  Location: [place];  Service: Orthopedics;  Laterality: Right;   LEFT HEART CATH N/A 10/11/2012   Procedure: LEFT HEART CATH;  Surgeon: Sherren Mocha, MD;  Location: [facility];  Service: Cardiovascular;  Laterality: N/A;   LITHOTRIPSY  EARLY 2000'S   X 2 OR 3   RADIAL ARTERY HARVEST Left 03/01/2016   Procedure: LEFT RADIAL ARTERY HARVEST;  Surgeon: Grace Isaac, MD;  Location: [place];  Service: Open Heart Surgery;  Laterality: Left;   SPLIT NIGHT STUDY  09/21/2015   TEE WITHOUT CARDIOVERSION N/A 03/01/2016   Procedure: TRANSESOPHAGEAL ECHOCARDIOGRAM (TEE);  Surgeon: Grace Isaac, MD;  Location: [place];  Service: Open Heart Surgery;  Laterality: N/A;   TONSILLECTOMY  AS CHILD   Family History  Problem Relation Age of Onset   Heart disease Mother     Stroke Father    Healthy Brother    Colon cancer Neg Hx    Esophageal cancer Neg Hx    Rectal cancer Neg Hx    Stomach cancer Neg Hx    Social History   Socioeconomic History   Marital status: Married    Spouse name: Bethena Roys   Number of children: Not on file   Years of education: Not on file   Highest education level: Not on file  Occupational History   Occupation: retired  Tobacco Use   Smoking status: Former    Packs/day: 0.50    Years: 10.00    Pack years: 5.00    Types: Cigars, Cigarettes    Quit date: 05/30/2002    Years since quitting: 19.0   Smokeless tobacco: Never  Vaping Use   Vaping Use: Never used  Substance and Sexual Activity   Alcohol use: Not Currently    Comment: occ BEER   Drug use: No   Sexual activity: Not Currently  Other Topics Concern   Not on file  Social History Narrative   VA benefits - goes to [place] once or twice a year at least - has most vaccines and screenings there   Social Determinants of Radio broadcast assistant Strain: Low Risk    Difficulty of Paying Living Expenses: Not hard at all  Food Insecurity: No Food Insecurity   Worried About Charity fundraiser in the Last Year: Never true   [place] in the Last Year: Never true  Transportation Needs: No Transportation Needs   Lack of Transportation (Medical): No   Lack of Transportation (Non-Medical): No  Physical Activity: Insufficiently Active   Days of Exercise per Week: 5 days   Minutes of Exercise per Session: 20 min  Stress: No Stress Concern Present   Feeling of Stress : Only a little  Social Connections: Engineer, building services of Communication with Friends and Family: More than three times a week   Frequency of Social Gatherings with Friends and Family: More than three times a week   Attends Religious Services: More than 4 times per year   Active Member of [employer] or Organizations: Yes   Attends Archivist Meetings: 1 to 4 times per year   Marital  Status: Married    Tobacco Counseling Counseling given: Not Answered   Clinical Intake:  Pre-visit preparation completed: Yes  Pain : No/denies pain     BMI - recorded: 34.17  Nutritional Status: BMI > 30  Obese Nutritional Risks: None Diabetes: No  How often do you need to have someone help you when you read instructions, pamphlets, or other written materials from your doctor or pharmacy?: 1 - Never  Diabetic? no  Interpreter Needed?: No  Information entered by :: Lyell Clugston, LPN   Activities of Daily Living In your present state of health, do you have any difficulty performing the following activities: 06/01/2021 08/07/2020  Hearing? N N  Vision? N N  Difficulty concentrating or making decisions? N N  Walking or climbing stairs? N N  Dressing or bathing? N N  Doing errands, shopping? N -  Preparing Food and eating ? N -  Using the Toilet? N -  In the past six months, have you accidently leaked urine? N -  Do you have problems with loss of bowel control? N -  Managing your Medications? N -  Managing your Finances? N -  Housekeeping or managing your Housekeeping? N -  Some recent data might be hidden    Patient Care Team: Chevis Pretty, FNP as PCP - General (Family Medicine) Harl Bowie, Alphonse Guild, MD as PCP - Cardiology (Cardiology) Myrla Halsted, MD as Referring Physician (Family Medicine)  Indicate any recent Medical Services you may have received from other than [facility] providers in the past year (date may be approximate).     Assessment:   This is a routine wellness examination for Huck.  Hearing/Vision screen Hearing Screening - Comments:: Denies hearing difficulties  Vision Screening - Comments:: Wears reading glasses prn only - up to date with annual eye exams with [facility]  Dietary issues and exercise activities discussed: Current Exercise Habits: Home exercise routine, Type of exercise: walking, Time (Minutes): 20, Frequency (Times/Week): 5,  Weekly Exercise (Minutes/Week): 100, Intensity: Mild, Exercise limited by: cardiac condition(s)   Goals Addressed             This Visit's Progress    Exercise 150 min/wk Moderate Activity         Depression Screen PHQ 2/9 Scores 06/01/2021 02/28/2020 11/16/2018 06/25/2018 04/10/2017 05/09/2016 10/23/2013  PHQ - 2 Score 1 0 0 0 0 0 0    Fall Risk Fall Risk  06/01/2021 02/28/2020 04/10/2017 05/09/2016 10/23/2013  Falls in the past year? 0 0 No No No  Number falls in past yr: 0 0 - - -  Injury with Fall? 0 0 - - -  Risk for fall due to : No Fall Risks No Fall Risks - - -  Follow up Falls prevention discussed Falls evaluation completed - - -    FALL RISK PREVENTION PERTAINING TO THE HOME:  Any stairs in or around the home? Yes  If so, are there any without handrails? No  Home free of loose throw rugs in walkways, pet beds, electrical cords, etc? Yes  Adequate lighting in your home to reduce risk of falls? Yes   ASSISTIVE DEVICES UTILIZED TO PREVENT FALLS:  Life alert? No  Use of a cane, walker or w/c? No  Grab bars in the bathroom? No  Shower chair or bench in shower? No  Elevated toilet seat or a handicapped toilet? No   TIMED UP AND GO:  Was the test performed? No . Telephonic visit  Cognitive Function:     6CIT Screen 06/01/2021  What Year? 0 points  What month? 0 points  What time? 0 points  Count back from 20 0 points  Months in reverse 2 points  Repeat phrase 2 points  Total Score 4    Immunizations Immunization History  Administered Date(s) Administered   Influenza,inj,Quad PF,6+ Mos 03/07/2016, 06/25/2018   Influenza-Unspecified 04/04/2015, 03/30/2021   Moderna Sars-Covid-2 Vaccination 07/11/2019, 08/09/2019, 04/16/2020, 05/18/2021   Tdap 10/24/2011    TDAP status: Up to date  Flu Vaccine status: Up to date  Pneumococcal vaccine status: Up to date  Covid-19 vaccine status: Completed vaccines  Qualifies for Shingles Vaccine? Yes   Zostavax completed  Yes   Shingrix Completed?: Yes  Screening Tests Health Maintenance  Topic Date Due   Pneumonia Vaccine 10+ Years old (1 - PCV) Never done   Zoster Vaccines- Shingrix (1 of 2) Never done   COLON CANCER SCREENING ANNUAL FOBT  05/15/2021   COVID-19 Vaccine (5 - Booster for Moderna series) 07/13/2021   TETANUS/TDAP  10/23/2021   INFLUENZA VACCINE  Completed   Hepatitis C Screening  Completed   HPV VACCINES  Aged Out   COLONOSCOPY (Pts 45-49yrs Insurance coverage will need to be confirmed)  Discontinued    Health Maintenance  Health Maintenance Due  Topic Date Due   Pneumonia Vaccine 64+ Years old (1 - PCV) Never done   Zoster Vaccines- Shingrix (1 of 2) Never done   COLON CANCER SCREENING ANNUAL FOBT  05/15/2021    Colorectal cancer screening: No longer required.   Lung Cancer Screening: (Low Dose CT Chest recommended if Age 22-80 years, 30 pack-year currently smoking OR have quit w/in 15years.) does not qualify  Additional Screening:  Hepatitis C Screening: does qualify; Completed 07/20/2020  Vision Screening: Recommended annual ophthalmology exams for early detection of glaucoma and other disorders of the eye. Is the patient up to date with their annual eye exam?  Yes  Who is the provider or what is the name of the office in which the patient attends annual eye exams? [facility] If pt is not established with a provider, would they like to be referred to a provider to establish care? No .   Dental Screening: Recommended annual dental exams for proper oral hygiene  Community Resource Referral / Chronic Care Management: CRR required this visit?  No   CCM required this visit?  No      Plan:     I have personally reviewed and noted the following in the patients chart:   Medical and social history Use of alcohol, tobacco or illicit drugs  Current medications and supplements including opioid prescriptions. Patient is not currently taking opioid prescriptions. Functional  ability and status Nutritional status Physical activity Advanced directives List of other physicians Hospitalizations, surgeries, and ER visits in previous 12 months Vitals Screenings to include cognitive, depression, and falls Referrals and appointments  In addition, I have reviewed and discussed with patient certain preventive protocols, quality metrics, and best practice recommendations. A written personalized care plan for preventive services as well as general preventive health recommendations were provided to patient.     Sandrea Hammond, LPN   08/01/3297   Nurse Notes: FYI: patient wants you to know that [place] doctor thinks he has cirrhosis and he is going for further testing later this month.

## 2021-06-01 NOTE — Patient Instructions (Signed)
Hayden Rivera , Thank you for taking time to come for your Medicare Wellness Visit. I appreciate your ongoing commitment to your health goals. Please review the following plan we discussed and let me know if I can assist you in the future.   Screening recommendations/referrals: Colonoscopy: Done 05/15/2020 - no repeat Recommended yearly ophthalmology/optometry visit for glaucoma screening and checkup Recommended yearly dental visit for hygiene and checkup  Vaccinations: Influenza vaccine: Done 03/2021 - Repeat annually  Pneumococcal vaccine: Done at [facility]? We need dates please Tdap vaccine: Done 10/24/2011 - Repeat in 10 years  Shingles vaccine: Done at [place]? We need dates please   Covid-19: Done 07/11/2019, 08/09/2019, 04/16/2020, & 05/18/2021  Advanced directives: Advance directive discussed with you today. Even though you declined this today, please call our office should you change your mind, and we can give you the proper paperwork for you to fill out.   Conditions/risks identified: Aim for 30 minutes of exercise or brisk walking each day, drink 6-8 glasses of water and eat lots of fruits and vegetables.   Next appointment: Follow up in one year for your annual wellness visit.  Preventive Care 21 Years and Older, Male  Preventive care refers to lifestyle choices and visits with your health care provider that can promote health and wellness. What does preventive care include? A yearly physical exam. This is also called an annual well check. Dental exams once or twice a year. Routine eye exams. Ask your health care provider how often you should have your eyes checked. Personal lifestyle choices, including: Daily care of your teeth and gums. Regular physical activity. Eating a healthy diet. Avoiding tobacco and drug use. Limiting alcohol use. Practicing safe sex. Taking low doses of aspirin every day. Taking vitamin and mineral supplements as recommended by your health care provider. What  happens during an annual well check? The services and screenings done by your health care provider during your annual well check will depend on your age, overall health, lifestyle risk factors, and family history of disease. Counseling  Your health care provider may ask you questions about your: Alcohol use. Tobacco use. Drug use. Emotional well-being. Home and relationship well-being. Sexual activity. Eating habits. History of falls. Memory and ability to understand (cognition). Work and work Statistician. Screening  You may have the following tests or measurements: Height, weight, and BMI. Blood pressure. Lipid and cholesterol levels. These may be checked every 5 years, or more frequently if you are over 29 years old. Skin check. Lung cancer screening. You may have this screening every year starting at age 65 if you have a 30-pack-year history of smoking and currently smoke or have quit within the past 15 years. Fecal occult blood test (FOBT) of the stool. You may have this test every year starting at age 50. Flexible sigmoidoscopy or colonoscopy. You may have a sigmoidoscopy every 5 years or a colonoscopy every 10 years starting at age 14. Prostate cancer screening. Recommendations will vary depending on your family history and other risks. Hepatitis C blood test. Hepatitis B blood test. Sexually transmitted disease (STD) testing. Diabetes screening. This is done by checking your blood sugar (glucose) after you have not eaten for a while (fasting). You may have this done every 1-3 years. Abdominal aortic aneurysm (AAA) screening. You may need this if you are a current or former smoker. Osteoporosis. You may be screened starting at age 69 if you are at high risk. Talk with your health care provider about your test results,  treatment options, and if necessary, the need for more tests. Vaccines  Your health care provider may recommend certain vaccines, such as: Influenza vaccine. This  is recommended every year. Tetanus, diphtheria, and acellular pertussis (Tdap, Td) vaccine. You may need a Td booster every 10 years. Zoster vaccine. You may need this after age 29. Pneumococcal 13-valent conjugate (PCV13) vaccine. One dose is recommended after age 33. Pneumococcal polysaccharide (PPSV23) vaccine. One dose is recommended after age 44. Talk to your health care provider about which screenings and vaccines you need and how often you need them. This information is not intended to replace advice given to you by your health care provider. Make sure you discuss any questions you have with your health care provider. Document Released: 06/12/2015 Document Revised: 02/03/2016 Document Reviewed: 03/17/2015 Elsevier Interactive Patient Education  2017 [place] Prevention in the Home Falls can cause injuries. They can happen to people of all ages. There are many things you can do to make your home safe and to help prevent falls. What can I do on the outside of my home? Regularly fix the edges of walkways and driveways and fix any cracks. Remove anything that might make you trip as you walk through a door, such as a raised step or threshold. Trim any bushes or trees on the path to your home. Use bright outdoor lighting. Clear any walking paths of anything that might make someone trip, such as rocks or tools. Regularly check to see if handrails are loose or broken. Make sure that both sides of any steps have handrails. Any raised decks and porches should have guardrails on the edges. Have any leaves, snow, or ice cleared regularly. Use sand or salt on walking paths during winter. Clean up any spills in your garage right away. This includes oil or grease spills. What can I do in the bathroom? Use night lights. Install grab bars by the toilet and in the tub and shower. Do not use towel bars as grab bars. Use non-skid mats or decals in the tub or shower. If you need to sit down  in the shower, use a plastic, non-slip stool. Keep the floor dry. Clean up any water that spills on the floor as soon as it happens. Remove soap buildup in the tub or shower regularly. Attach bath mats securely with double-sided non-slip rug tape. Do not have throw rugs and other things on the floor that can make you trip. What can I do in the bedroom? Use night lights. Make sure that you have a light by your bed that is easy to reach. Do not use any sheets or blankets that are too big for your bed. They should not hang down onto the floor. Have a firm chair that has side arms. You can use this for support while you get dressed. Do not have throw rugs and other things on the floor that can make you trip. What can I do in the kitchen? Clean up any spills right away. Avoid walking on wet floors. Keep items that you use a lot in easy-to-reach places. If you need to reach something above you, use a strong step stool that has a grab bar. Keep electrical cords out of the way. Do not use floor polish or wax that makes floors slippery. If you must use wax, use non-skid floor wax. Do not have throw rugs and other things on the floor that can make you trip. What can I do with my stairs? Do  not leave any items on the stairs. Make sure that there are handrails on both sides of the stairs and use them. Fix handrails that are broken or loose. Make sure that handrails are as long as the stairways. Check any carpeting to make sure that it is firmly attached to the stairs. Fix any carpet that is loose or worn. Avoid having throw rugs at the top or bottom of the stairs. If you do have throw rugs, attach them to the floor with carpet tape. Make sure that you have a light switch at the top of the stairs and the bottom of the stairs. If you do not have them, ask someone to add them for you. What else can I do to help prevent falls? Wear shoes that: Do not have high heels. Have rubber bottoms. Are comfortable  and fit you well. Are closed at the toe. Do not wear sandals. If you use a stepladder: Make sure that it is fully opened. Do not climb a closed stepladder. Make sure that both sides of the stepladder are locked into place. Ask someone to hold it for you, if possible. Clearly mark and make sure that you can see: Any grab bars or handrails. First and last steps. Where the edge of each step is. Use tools that help you move around (mobility aids) if they are needed. These include: Canes. Walkers. Scooters. Crutches. Turn on the lights when you go into a dark area. Replace any light bulbs as soon as they burn out. Set up your furniture so you have a clear path. Avoid moving your furniture around. If any of your floors are uneven, fix them. If there are any pets around you, be aware of where they are. Review your medicines with your doctor. Some medicines can make you feel dizzy. This can increase your chance of falling. Ask your doctor what other things that you can do to help prevent falls. This information is not intended to replace advice given to you by your health care provider. Make sure you discuss any questions you have with your health care provider. Document Released: 03/12/2009 Document Revised: 10/22/2015 Document Reviewed: 06/20/2014 Elsevier Interactive Patient Education  2017 [employer].

## 2021-06-11 ENCOUNTER — Other Ambulatory Visit: Payer: Self-pay | Admitting: Cardiology

## 2021-06-11 ENCOUNTER — Other Ambulatory Visit: Payer: Self-pay | Admitting: Nurse Practitioner

## 2021-06-11 DIAGNOSIS — R053 Chronic cough: Secondary | ICD-10-CM

## 2021-06-14 ENCOUNTER — Other Ambulatory Visit: Payer: Self-pay

## 2021-06-14 ENCOUNTER — Ambulatory Visit (HOSPITAL_COMMUNITY): Payer: Medicare HMO | Attending: Cardiology

## 2021-06-14 DIAGNOSIS — I513 Intracardiac thrombosis, not elsewhere classified: Secondary | ICD-10-CM | POA: Diagnosis not present

## 2021-06-14 LAB — ECHOCARDIOGRAM LIMITED
Area-P 1/2: 2.24 cm2
S' Lateral: 3.5 cm

## 2021-06-14 MED ORDER — PERFLUTREN LIPID MICROSPHERE
3.0000 mL | INTRAVENOUS | Status: AC | PRN
  Administered 2021-06-14: 3 mL via INTRAVENOUS

## 2021-06-15 ENCOUNTER — Other Ambulatory Visit: Payer: Self-pay | Admitting: Cardiology

## 2021-06-15 ENCOUNTER — Telehealth: Payer: Self-pay | Admitting: Cardiology

## 2021-06-15 ENCOUNTER — Other Ambulatory Visit: Payer: Self-pay | Admitting: Nurse Practitioner

## 2021-06-15 DIAGNOSIS — I480 Paroxysmal atrial fibrillation: Secondary | ICD-10-CM

## 2021-06-15 DIAGNOSIS — R053 Chronic cough: Secondary | ICD-10-CM

## 2021-06-15 DIAGNOSIS — I10 Essential (primary) hypertension: Secondary | ICD-10-CM

## 2021-06-15 DIAGNOSIS — Z951 Presence of aortocoronary bypass graft: Secondary | ICD-10-CM

## 2021-06-15 MED ORDER — METOPROLOL SUCCINATE ER 25 MG PO TB24: 12.5000 mg | ORAL_TABLET | Freq: Every day | ORAL | 0 refills | Status: DC

## 2021-06-15 NOTE — Telephone Encounter (Signed)
Patient had limited echo 06/14/2021. He is wanting to know if he really needs the stress test scheduled for 06/16/2021.

## 2021-06-15 NOTE — Telephone Encounter (Signed)
Patient notified and verbalized understanding that he would still need to do the stress test.  Explained that the Echo looks more at valves and chambers & the stress test looks at blood flow and blockages.

## 2021-06-15 NOTE — Telephone Encounter (Signed)
°*  STAT* If patient is at the pharmacy, call can be transferred to refill team.   1. Which medications need to be refilled? (please list name of each medication and dose if known) metoprolol succinate (TOPROL-XL) 25 MG 24 hr tablet  2. Which pharmacy/location (including street and city if local pharmacy) is medication to be sent to? [place], [address]  3. Do they need a 30 day or 90 day supply? 7 day (short term supply)

## 2021-06-16 ENCOUNTER — Ambulatory Visit (HOSPITAL_COMMUNITY)
Admission: RE | Admit: 2021-06-16 | Discharge: 2021-06-16 | Disposition: A | Discharge status: Home / Self Care | Payer: Medicare HMO | Source: Ambulatory Visit | Attending: Cardiology | Admitting: Cardiology

## 2021-06-16 ENCOUNTER — Other Ambulatory Visit: Payer: Self-pay

## 2021-06-16 DIAGNOSIS — R079 Chest pain, unspecified: Secondary | ICD-10-CM | POA: Insufficient documentation

## 2021-06-16 DIAGNOSIS — R0789 Other chest pain: Secondary | ICD-10-CM

## 2021-06-16 LAB — NM MYOCAR MULTI W/SPECT W/WALL MOTION / EF
LV dias vol: 114 mL (ref 62–150)
LV sys vol: 69 mL
Nuc Stress EF: 40 %
Peak HR: 73 {beats}/min
RATE: 0.5
Rest HR: 64 {beats}/min
Rest Nuclear Isotope Dose: 10.1 mCi
SDS: 4
SRS: 11
SSS: 15
ST Depression (mm): 0 mm
Stress Nuclear Isotope Dose: 30 mCi
TID: 1.12

## 2021-06-16 MED ORDER — TECHNETIUM TC 99M TETROFOSMIN IV KIT
10.0000 | PACK | Freq: Once | INTRAVENOUS | Status: AC | PRN
  Administered 2021-06-16: 10.1 via INTRAVENOUS

## 2021-06-16 MED ORDER — SODIUM CHLORIDE FLUSH 0.9 % IV SOLN
INTRAVENOUS | Status: AC
  Administered 2021-06-16: 10 mL via INTRAVENOUS
  Filled 2021-06-16: qty 10

## 2021-06-16 MED ORDER — TECHNETIUM TC 99M TETROFOSMIN IV KIT
30.0000 | PACK | Freq: Once | INTRAVENOUS | Status: AC | PRN
  Administered 2021-06-16: 30 via INTRAVENOUS

## 2021-06-16 MED ORDER — REGADENOSON 0.4 MG/5ML IV SOLN
INTRAVENOUS | Status: AC
  Administered 2021-06-16: 0.4 mg via INTRAVENOUS
  Filled 2021-06-16: qty 5

## 2021-06-23 ENCOUNTER — Telehealth: Payer: Self-pay | Admitting: *Deleted

## 2021-06-23 NOTE — Telephone Encounter (Signed)
-----   Message from Arnoldo Lenis, MD sent at 06/23/2021 10:23 AM EST ----- Stress shows some evidence of old muscle damage. There is a relatively small area of what looks to be current blockage that is something we would just work to manage with medications at this time. How are his symptoms doing?  Zandra Abts MD

## 2021-06-23 NOTE — Telephone Encounter (Signed)
Patient informed. Copy sent to PCP Reports symptoms are about the same

## 2021-06-29 NOTE — Telephone Encounter (Signed)
If ongoing symptoms increase imdur to 60mg  daily, needs f/u 4-6 weeks  Zandra Abts MD

## 2021-06-30 ENCOUNTER — Other Ambulatory Visit: Payer: Self-pay | Admitting: *Deleted

## 2021-06-30 DIAGNOSIS — I480 Paroxysmal atrial fibrillation: Secondary | ICD-10-CM

## 2021-06-30 DIAGNOSIS — Z79899 Other long term (current) drug therapy: Secondary | ICD-10-CM

## 2021-06-30 MED ORDER — ISOSORBIDE MONONITRATE ER 60 MG PO TB24: 60.0000 mg | ORAL_TABLET | Freq: Every day | ORAL | 3 refills | Status: DC

## 2021-06-30 NOTE — Progress Notes (Signed)
BMET was ordered at 05/26/2021 visit but no order was placed. Order placed and faxed to [place].

## 2021-06-30 NOTE — Telephone Encounter (Signed)
Patient informed and verbalized understanding of plan. 

## 2021-07-05 ENCOUNTER — Telehealth: Payer: Self-pay | Admitting: *Deleted

## 2021-07-05 ENCOUNTER — Encounter: Payer: Self-pay | Admitting: *Deleted

## 2021-07-05 NOTE — Telephone Encounter (Signed)
-----   Message from Arnoldo Lenis, MD sent at 07/04/2021  5:27 PM EST ----- Limited echo shows no evidence of any blood clots in the heart   Zandra Abts MD

## 2021-07-05 NOTE — Telephone Encounter (Signed)
Laurine Blazer, LPN  07/06/6145  0:92 PM EST Back to Top    Notified via my chart.  Copy to pcp.

## 2021-07-23 ENCOUNTER — Other Ambulatory Visit: Payer: Medicare HMO

## 2021-07-23 DIAGNOSIS — I480 Paroxysmal atrial fibrillation: Secondary | ICD-10-CM | POA: Diagnosis not present

## 2021-07-23 DIAGNOSIS — Z79899 Other long term (current) drug therapy: Secondary | ICD-10-CM | POA: Diagnosis not present

## 2021-07-24 LAB — BASIC METABOLIC PANEL
BUN/Creatinine Ratio: 14 (ref 10–24)
BUN: 13 mg/dL (ref 8–27)
CO2: 18 mmol/L — ABNORMAL LOW (ref 20–29)
Calcium: 9 mg/dL (ref 8.6–10.2)
Chloride: 109 mmol/L — ABNORMAL HIGH (ref 96–106)
Creatinine, Ser: 0.92 mg/dL (ref 0.76–1.27)
Glucose: 169 mg/dL — ABNORMAL HIGH (ref 70–99)
Potassium: 4.1 mmol/L (ref 3.5–5.2)
Sodium: 145 mmol/L — ABNORMAL HIGH (ref 134–144)
eGFR: 89 mL/min/{1.73_m2} (ref >59)

## 2021-07-28 ENCOUNTER — Telehealth: Payer: Self-pay | Admitting: *Deleted

## 2021-07-28 NOTE — Telephone Encounter (Signed)
Laurine Blazer, LPN  ?0/0/9233  0:07 PM EST Back to Top  ?  ?Notified, copy to pcp.   ? ?

## 2021-07-28 NOTE — Telephone Encounter (Signed)
-----   Message from Merlene Laughter, RN sent at 07/27/2021  1:42 PM EST ----- ? ?----- Message ----- ?From: Arnoldo Lenis, MD ?Sent: 07/25/2021  10:33 AM EST ?To: Merlene Laughter, RN ? ?Normal labs ? ?Zandra Abts MD ? ?

## 2021-08-18 ENCOUNTER — Encounter: Payer: Self-pay | Admitting: Gastroenterology

## 2021-08-18 ENCOUNTER — Ambulatory Visit (INDEPENDENT_AMBULATORY_CARE_PROVIDER_SITE_OTHER): Payer: No Typology Code available for payment source | Admitting: Gastroenterology

## 2021-08-18 ENCOUNTER — Other Ambulatory Visit (INDEPENDENT_AMBULATORY_CARE_PROVIDER_SITE_OTHER): Payer: No Typology Code available for payment source

## 2021-08-18 VITALS — BP 120/64 | HR 64 | Ht 71.0 in | Wt 255.0 lb

## 2021-08-18 DIAGNOSIS — K7460 Unspecified cirrhosis of liver: Secondary | ICD-10-CM

## 2021-08-18 DIAGNOSIS — Z0289 Encounter for other administrative examinations: Secondary | ICD-10-CM

## 2021-08-18 LAB — COMPREHENSIVE METABOLIC PANEL
ALT: 36 U/L (ref 0–53)
AST: 34 U/L (ref 0–37)
Albumin: 4.4 g/dL (ref 3.5–5.2)
Alkaline Phosphatase: 75 U/L (ref 39–117)
BUN: 15 mg/dL (ref 6–23)
CO2: 26 mEq/L (ref 19–32)
Calcium: 9.4 mg/dL (ref 8.4–10.5)
Chloride: 108 mEq/L (ref 96–112)
Creatinine, Ser: 1.11 mg/dL (ref 0.40–1.50)
GFR: 66.88 mL/min (ref >60.00)
Glucose, Bld: 147 mg/dL — ABNORMAL HIGH (ref 70–99)
Potassium: 4 mEq/L (ref 3.5–5.1)
Sodium: 141 mEq/L (ref 135–145)
Total Bilirubin: 0.8 mg/dL (ref 0.2–1.2)
Total Protein: 7.2 g/dL (ref 6.0–8.3)

## 2021-08-18 LAB — PROTIME-INR
INR: 1.2 ratio — ABNORMAL HIGH (ref 0.8–1.0)
Prothrombin Time: 13.5 s — ABNORMAL HIGH (ref 9.6–13.1)

## 2021-08-18 LAB — CBC
HCT: 42 % (ref 39.0–52.0)
Hemoglobin: 14.2 g/dL (ref 13.0–17.0)
MCHC: 33.8 g/dL (ref 30.0–36.0)
MCV: 96.8 fl (ref 78.0–100.0)
Platelets: 99 10*3/uL — ABNORMAL LOW (ref 150.0–400.0)
RBC: 4.34 Mil/uL (ref 4.22–5.81)
RDW: 14 % (ref 11.5–15.5)
WBC: 6.1 10*3/uL (ref 4.0–10.5)

## 2021-08-18 NOTE — Progress Notes (Signed)
Review of pertinent gastrointestinal problems: ?1.  History of precancerous colon polyps.  Colonoscopy March 2000 2012 polyps were removed, one of the polyps was a 20 mm tubular adenoma at the athletic flexure which was removed and tattooed.  Repeat colonoscopy December 2021 the site of 2020 hepatic flexure polypectomy was normal-appearing. Random colon biopsies were taken because of chronic diarrhea.  These were normal.  His terminal ileum was normal. 5 mm polyp was removed and it was not precancerous.  Recall should be December 2026.  ?2.  Months of loose stools eventually led to colonoscopy.  Sed rate normal, GI pathogen panel negative.  Colonoscopy as above December 2021.  His symptoms improved with Imodium on a scheduled basis. ?3.  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  December 2021 "lobular hepatic contours and borderline splenomegaly" ?Ultrasound January 2022 nodular liver contour, no focal hepatic lesions, positive splenomegaly. ?MELD-Na score 10 (05/2020 labs) ?EGD 09/2020 small esophageal varices, no other signs of portal hypertension, recall EGD at 2 years ? ? ? ?HPI: ?This is a very pleasant 71 year old man whom I saw about 10 months ago at the time of an upper endoscopy.  See that results summarized above ? ?I reviewed and 18 page packet from the [place] system.  Including old ultrasound results which was done for hematuria.  This looks like a referral however I cannot see any reason why he was being referred here, he is already a patient here there is a single partial sentence that states "clinical history CT done at [facility] shows fatty liver and possible early cirrhosis".  There was no CT scan report and the 18 page packet however.  There are no lab results in the 18 page packet.  Most of the information the 18 page packet is absolutely completely worthless to his visit here. ? ?He has had no trouble with vomiting blood, no obvious encephalopathic events, no overt lower GI bleeding.   He feels quite well.  He has had some slight swelling in his legs at times. ? ?He was told by his [place] provider that he had a "small amount of A-fib" on a Holter monitor type test: They did not think he needed to be anticoagulated for it.  He is not sure if his cardiologist here is aware of that ? ? ?ROS: complete GI ROS as described in HPI, all other review negative. ? ?Constitutional:  No unintentional weight loss ? ? ?Past Medical History:  ?Diagnosis Date  ? Anginal pain ([place])   ? occ  ? Aortic stenosis 06/16/2017  ? Mild, noted on ECHO  ? Arthritis   ? Atrial fibrillation ([place]) 02/2016  ? post op  ? CAD (coronary artery disease)   ? a. Anterior STEMI 2005 c/b vfib arrest, PCI to LAD at [facility]. b. Stent thrombosis 2006 with DES within prior LAD stent at [facility]. C. 09/2012: s/p balloon angioplasty to mLAD for severe stenosis in previously stented segment & DES to LCx; initial enz neg but ruled in for NSTEMI after post-cath vagal sx, felt d/t distal emboliz of thrombus during case.  ? Cancer [facility])   ? skin ca melanoma, pt states was removed from back  ? Chronic back pain   ? Dependent edema   ? Mild, occ NONE RECENT  ? Diarrhea   ? DM type 2 (diabetes mellitus, type 2) ([place])   ? diet controlled  ? Elevated hemidiaphragm   ? a. Noted 09/2012 - instructed to f/u PCP.  ? Heart  murmur   ? History of kidney stones   ? Hyperlipidemia   ? Hypertension   ? Ischemic cardiomyopathy   ? a. Unclear prior EF but pt was told heart was weakened in past. b. EF normal 09/2012.  ? Myocardial infarction [facility]) 2005  ? TOTAL MI'S 4 LAST ONE 2017  ? Retinal tear, right   ? Ventricular fibrillation ([place])   ? a. VF arrest 2005 in setting of STEMI.  ? Wears glasses   ? Wears glasses   ? for reading  ? ? ?Past Surgical History:  ?Procedure Laterality Date  ? BACK SURGERY  1997  ? UPPER CERVICAL C 3 TO C 4 FUSION  ? CARDIAC CATHETERIZATION    ? stents x2  ? CARDIAC CATHETERIZATION N/A 10/02/2014  ? Procedure: Left Heart Cath And Coronary  Angiography;  Surgeon: Larey Dresser, MD;  Location: [place] INVASIVE CV LAB CUPID;  Service: Cardiovascular;  Laterality: N/A;  ? CARDIAC CATHETERIZATION N/A 02/17/2016  ? Procedure: Left Heart Cath and Coronary Angiography;  Surgeon: Larey Dresser, MD;  Location: [place] CV LAB;  Service: Cardiovascular;  Laterality: N/A;  ? CORONARY ARTERY BYPASS GRAFT N/A 03/01/2016  ? Procedure: CORONARY ARTERY BYPASS GRAFTING (CABG)x3 with endoscopic harvesting of right saphenous vein -LIMA to LAD -SVG to LEFT CIRCUMFLEX -RADIAL ARTERY to RAMUS INTERMEDIA;  Surgeon: Grace Isaac, MD;  Location: [place];  Service: Open Heart Surgery;  Laterality: N/A;  ? CORONARY STENT PLACEMENT  2005 AND REAPLCED 2006, 2014  ? CYSTOSCOPY/RETROGRADE/URETEROSCOPY/STONE EXTRACTION WITH BASKET Bilateral 08/07/2020  ? Procedure: CYSTOSCOPY/RETROGRADE/URETEROSCOPY, HOLMIUM LASER LITHOTRIPSY /STONE EXTRACTION WITH BASKET/ LEFT STENT PLACEMENT;  Surgeon: [name], MD;  Location: [facility];  Service: Urology;  Laterality: Bilateral;  ? CYSTOSCOPY/URETEROSCOPY/HOLMIUM LASER/STENT PLACEMENT Bilateral 05/29/2018  ? Procedure: CYSTOSCOPY/RETROGRADE/URETEROSCOPY/HOLMIUM LASER/STENT PLACEMENT/ BASKET STONE EXTRACTION;  Surgeon: [name], MD;  Location: [facility];  Service: Urology;  Laterality: Bilateral;  ? EYE EXAMINATION UNDER ANESTHESIA W/ RETINAL CRYOTHERAPY AND RETINAL LASER Right 2018  ? HAND SURGERY Left 1990'S  ? hamick bone rem  ? HOLMIUM LASER APPLICATION Bilateral 0/93/2355  ? Procedure: HOLMIUM LASER APPLICATION;  Surgeon: [name], MD;  Location: [facility];  Service: Urology;  Laterality: Bilateral;  ? KNEE ARTHROSCOPY Right 10/24/2014  ? Procedure: ARTHROSCOPY RIGHT KNEE, Partial medial menisectomy and chondroplasty;  Surgeon: Melrose Nakayama, MD;  Location: [place];  Service: Orthopedics;  Laterality: Right;  ? LEFT HEART CATH N/A 10/11/2012  ? Procedure: LEFT HEART  CATH;  Surgeon: Sherren Mocha, MD;  Location: [facility] CATH LAB;  Service: Cardiovascular;  Laterality: N/A;  ? LITHOTRIPSY  EARLY 2000'S  ? X 2 OR 3  ? RADIAL ARTERY HARVEST Left 03/01/2016  ? Procedure: LEFT RADIAL ARTERY HARVEST;  Surgeon: Grace Isaac, MD;  Location: [place];  Service: Open Heart Surgery;  Laterality: Left;  ? SPLIT NIGHT STUDY  09/21/2015  ? TEE WITHOUT CARDIOVERSION N/A 03/01/2016  ? Procedure: TRANSESOPHAGEAL ECHOCARDIOGRAM (TEE);  Surgeon: Grace Isaac, MD;  Location: [place];  Service: Open Heart Surgery;  Laterality: N/A;  ? TONSILLECTOMY  AS CHILD  ? ? ?Current Outpatient Medications  ?Medication Instructions  ? acetaminophen (TYLENOL) 500 mg, Oral, Every 6 hours PRN  ? aspirin 81 mg, Oral, Daily  ? cetirizine (ZYRTEC) 10 mg, Oral, Daily PRN  ? clopidogrel (PLAVIX) 75 MG tablet TAKE 1 TABLET EVERY DAY  ? finasteride (PROSCAR) 5 MG tablet TAKES 1/4 TABLET IN PM  ? isosorbide mononitrate (  IMDUR) 60 mg, Oral, Daily  ? losartan (COZAAR) 25 mg, Oral, Daily  ? metoprolol succinate (TOPROL-XL) 12.5 mg, Oral, Daily  ? rosuvastatin (CRESTOR) 20 MG tablet TAKE 1 TABLET EVERY DAY  ? TURMERIC PO 2 capsules, Oral, Daily  ? UNABLE TO FIND Med Name: IMMODIUM PRN   ? ? ?Allergies as of 08/18/2021 - Review Complete 08/18/2021  ?Allergen Reaction Noted  ? Altace [ramipril] Shortness Of Breath and Other (See Comments) 10/13/2012  ? Penicillins Swelling 12/29/2010  ? ? ?Family History  ?Problem Relation Age of Onset  ? Heart disease Mother   ? Stroke Father   ? Healthy Brother   ? Colon cancer Neg Hx   ? Esophageal cancer Neg Hx   ? Rectal cancer Neg Hx   ? Stomach cancer Neg Hx   ? ? ?Social History  ? ?Socioeconomic History  ? Marital status: Married  ?  Spouse name: Bethena Roys  ? Number of children: Not on file  ? Years of education: Not on file  ? Highest education level: Not on file  ?Occupational History  ? Occupation: retired  ?Tobacco Use  ? Smoking status: Former  ?  Packs/day: 0.50  ?  Years: 10.00  ?   Pack years: 5.00  ?  Types: Cigars, Cigarettes  ?  Quit date: 05/30/2002  ?  Years since quitting: 19.2  ? Smokeless tobacco: Never  ?Vaping Use  ? Vaping Use: Never used  ?Substance and Sexual Activity  ? Alcohol [place]

## 2021-08-18 NOTE — Patient Instructions (Addendum)
If you are age 72 or older, your body mass index should be between 23-30. Your Body mass index is 35.57 kg/m?Marland Kitchen If this is out of the aforementioned range listed, please consider follow up with your Primary Care Provider.________________________________________________________ ? ?The [facility] providers would like to encourage you to use [facility] to communicate with providers for non-urgent requests or questions.  Due to long hold times on the telephone, sending your provider a message by [facility] may be a faster and more efficient way to get a response.  Please allow 48 business hours for a response.  Please remember that this is for non-urgent requests.  ?_______________________________________________________ ? ?Your provider has requested that you go to the basement level for lab work before leaving today. Press "B" on the elevator. The lab is located at the first door on the left as you exit the elevator. ? ?You will need an ultrasound in June 2023.  We will contact you to schedule this appointment. ? ?We will be in contact with your cardiologist regarding medication changes with metoprolol to nadolol. ? ?You will need to follow up in our office in 1 year.  We will contact you to schedule this appointment. ? ?Thank you for entrusting me with your care and choosing [facility]. ? ?Dr Ardis Hughs ? ? ? ?

## 2021-08-19 LAB — AFP TUMOR MARKER: AFP-Tumor Marker: 4.1 ng/mL (ref <6.1)

## 2021-08-23 ENCOUNTER — Other Ambulatory Visit: Payer: Self-pay

## 2021-08-23 MED ORDER — NADOLOL 20 MG PO TABS: 20.0000 mg | ORAL_TABLET | Freq: Every day | ORAL | 11 refills | Status: DC

## 2021-08-27 ENCOUNTER — Telehealth: Payer: Self-pay | Admitting: Gastroenterology

## 2021-08-27 MED ORDER — NADOLOL 20 MG PO TABS: 20.0000 mg | ORAL_TABLET | Freq: Every day | ORAL | 3 refills | Status: DC

## 2021-08-27 NOTE — Telephone Encounter (Signed)
Patient aware that Nadolol was sent to [facility] pharmacy as requested.  ?

## 2021-08-27 NOTE — Telephone Encounter (Signed)
Patient called and said the Naldolol from [facility] was $70 and he could not afford that.  He wants the prescription called into his mail-in pharmacy, [facility], phone [phone].  If there are any issues, please call patient.  Thank you. ?

## 2021-09-09 ENCOUNTER — Telehealth: Payer: Self-pay | Admitting: Gastroenterology

## 2021-09-09 NOTE — Telephone Encounter (Signed)
The pt just received his medication (nadolol) in the mail and is concerned about having BP/HR check on Monday.  We have rescheduled to the following Monday 4/24 at 10 am.  The pt has been advised of the information and verbalized understanding.      ?

## 2021-09-09 NOTE — Telephone Encounter (Signed)
Inbound call from patient would like a call back to know what the appt for 4/17 and if necessary to come if he haven't been on medication lonog ?

## 2021-09-20 ENCOUNTER — Ambulatory Visit: Payer: Non-veteran care | Admitting: Gastroenterology

## 2021-09-20 VITALS — BP 132/74 | HR 58

## 2021-09-20 DIAGNOSIS — K7460 Unspecified cirrhosis of liver: Secondary | ICD-10-CM

## 2021-09-20 NOTE — Progress Notes (Signed)
Patient states since starting nadolol patient has intermittent headaches, fatigue and joint pain.  ?

## 2021-09-21 NOTE — Progress Notes (Signed)
Left message for patient to return my call.

## 2021-09-22 ENCOUNTER — Ambulatory Visit: Payer: Medicare HMO | Admitting: Cardiology

## 2021-09-22 ENCOUNTER — Encounter: Payer: Self-pay | Admitting: Cardiology

## 2021-09-22 VITALS — BP 116/60 | HR 50 | Ht 71.0 in | Wt 251.4 lb

## 2021-09-22 DIAGNOSIS — K7460 Unspecified cirrhosis of liver: Secondary | ICD-10-CM | POA: Diagnosis not present

## 2021-09-22 DIAGNOSIS — I25118 Atherosclerotic heart disease of native coronary artery with other forms of angina pectoris: Secondary | ICD-10-CM | POA: Diagnosis not present

## 2021-09-22 DIAGNOSIS — E119 Type 2 diabetes mellitus without complications: Secondary | ICD-10-CM | POA: Diagnosis not present

## 2021-09-22 DIAGNOSIS — R5383 Other fatigue: Secondary | ICD-10-CM | POA: Diagnosis not present

## 2021-09-22 MED ORDER — SILDENAFIL CITRATE 20 MG PO TABS: ORAL_TABLET | ORAL | 3 refills | Status: DC

## 2021-09-22 MED ORDER — AMLODIPINE BESYLATE 5 MG PO TABS: 5.0000 mg | ORAL_TABLET | Freq: Every day | ORAL | 0 refills | Status: DC

## 2021-09-22 NOTE — Progress Notes (Signed)
Left message for patient to return my call.

## 2021-09-22 NOTE — Patient Instructions (Addendum)
Medication Instructions:  ?Your physician has recommended you make the following change in your medication:  ?Hold Nadolol for one week then call the office to update on fatigue ?Stop Imdur ?Start Norvasc 5 mg once a day ?Start sildenafil 20 mg 30 minutes prior to intercourse ? ?Labwork: ?none ? ?Testing/Procedures: ?none ? ?Follow-Up: ?Your physician recommends that you schedule a follow-up appointment in: 6 months ? ?Any Other Special Instructions Will Be Listed Below (If Applicable). ? ?If you need a refill on your cardiac medications before your next appointment, please call your pharmacy. ? ?

## 2021-09-22 NOTE — Progress Notes (Signed)
? ? ? ?Clinical Summary ?Hayden Rivera is a 71 y.o.male seen today for follow up of the following medical problems.  ?  ?1. CAD ?- multiple PCIs, CABG in 02/2016 ?08/2017 nuclear stress no ischemia, large prior infarct ? - committed to indefinite DAPT due to long history of significant disease and interventions ?  ? ?  ?Jan 2023 nuclear stress: anterior scar. Inferior infarct with peri-infarct ischemia. LVEF 40%. SDS 4.  ?- started imdur. No recent symptoms.Previously had been having jaw pain and SOB ?- wants to try viagra and possibly come off imdur.  ?  ?2. ICM ?- Jan 2019 echo LVEF 45-50%, akinesis mid-apicalanteroseptal wall. Grade II diastolic dysfunction ?- 10/3014 echo LVEF 50-55%, difficult to discern WMAs ?- no recent LE edema ?  ? Jan 2023 echo LVEF 50-55% ?- GI changed metoprolol to nadolol due to varicies ?-since change reports fatigue.  ?  ?  ?3. Mild aortic stenosis ?Jan 2019 mild AS: mean grade 12, AVA VTI 1.81 ?- no recent symptoms.   ?  ?12/2020 echo LVEF 50-55%, no significant AS mean grad 7 AVA TI 2.88 ?  ?  ?  ?4. HTN ?-cough on ramipril. Previuosly had not wanted to try losartan.  ?- compliant with meds ? ?- started on losartan ? ?  ?5. Hyperlipidemia ?Jan 2020 TC 131 TG 182 HDL 45 LDL 50 ?-  labs with [facility] and pcp ?  ?07/2019 TC 127 TG 472 LDL direct 39. ? ?  ?  ?6. DM2 ?- had been on metformin, he reports sugars improved and he reports pcp stopped metformin.  ?  ?  ?7. Abnormal CT scan ?- recent CT scan from [place] suggested possible LV apical thrombus.  ?- with apical akinesis from prior echos certaintly has risk for thrombus ?- Jan 2023 echo no thrombus ? ?8. Cirrhosis ?- followed by GI ?  ?SH: retired Scientist, product/process development ?  ?  ?Past Medical History:  ?Diagnosis Date  ? Anginal pain ([place])   ? occ  ? Aortic stenosis 06/16/2017  ? Mild, noted on ECHO  ? Arthritis   ? Atrial fibrillation ([place]) 02/2016  ? post op  ? CAD (coronary artery disease)   ? a. Anterior STEMI 2005 c/b vfib arrest, PCI to LAD  at [facility]. b. Stent thrombosis 2006 with DES within prior LAD stent at [facility]. C. 09/2012: s/p balloon angioplasty to mLAD for severe stenosis in previously stented segment & DES to LCx; initial enz neg but ruled in for NSTEMI after post-cath vagal sx, felt d/t distal emboliz of thrombus during case.  ? Cancer [facility])   ? skin ca melanoma, pt states was removed from back  ? Chronic back pain   ? Dependent edema   ? Mild, occ NONE RECENT  ? Diarrhea   ? DM type 2 (diabetes mellitus, type 2) ([place])   ? diet controlled  ? Elevated hemidiaphragm   ? a. Noted 09/2012 - instructed to f/u PCP.  ? Heart murmur   ? History of kidney stones   ? Hyperlipidemia   ? Hypertension   ? Ischemic cardiomyopathy   ? a. Unclear prior EF but pt was told heart was weakened in past. b. EF normal 09/2012.  ? Myocardial infarction [facility]) 2005  ? TOTAL MI'S 4 LAST ONE 2017  ? Retinal tear, right   ? Ventricular fibrillation ([place])   ? a. VF arrest 2005 in setting of STEMI.  ? Wears glasses   ? Wears glasses   ?  for reading  ? ? ? ?Allergies  ?Allergen Reactions  ? Altace [Ramipril] Shortness Of Breath and Other (See Comments)  ?  cough  ? Penicillins Swelling  ?  Has patient had a PCN reaction causing immediate rash, facial/tongue/throat swelling, SOB or lightheadedness with hypotension:unsure ?Has patient had a PCN reaction causing severe rash involving mucus membranes or skin necrosis:unsure ?Has patient had a PCN reaction that required hospitalization:No ?Has patient had a PCN reaction occurring within the last 10 years:NO ?If all of the above answers are "NO", then may proceed with Cephalosporin use. ? ?Childhood reaction ? ?  ? ? ? ?Current Outpatient Medications  ?Medication Sig Dispense Refill  ? acetaminophen (TYLENOL) 500 MG tablet Take 500 mg by mouth every 6 (six) hours as needed (PAIN).     ? aspirin 81 MG tablet Take 1 tablet (81 mg total) by mouth daily.    ? cetirizine (ZYRTEC) 10 MG tablet Take 10 mg by mouth daily as needed.     ?  clopidogrel (PLAVIX) 75 MG tablet TAKE 1 TABLET EVERY DAY 90 tablet 1  ? finasteride (PROSCAR) 5 MG tablet TAKES 1/4 TABLET IN PM    ? isosorbide mononitrate (IMDUR) 60 MG 24 hr tablet Take 1 tablet (60 mg total) by mouth daily. 90 tablet 3  ? losartan (COZAAR) 25 MG tablet Take 1 tablet (25 mg total) by mouth daily. 90 tablet 3  ? nadolol (CORGARD) 20 MG tablet Take 1 tablet (20 mg total) by mouth daily. 90 tablet 3  ? rosuvastatin (CRESTOR) 20 MG tablet TAKE 1 TABLET EVERY DAY 90 tablet 1  ? TURMERIC PO Take 2 capsules by mouth daily.    ? UNABLE TO FIND Med Name: IMMODIUM PRN    ? ?Current Facility-Administered Medications  ?Medication Dose Route Frequency Provider Last Rate Last Admin  ? 0.9 %  sodium chloride infusion  500 mL Intravenous Continuous Milus Banister, MD      ? ? ? ?Past Surgical History:  ?Procedure Laterality Date  ? BACK SURGERY  1997  ? UPPER CERVICAL C 3 TO C 4 FUSION  ? CARDIAC CATHETERIZATION    ? stents x2  ? CARDIAC CATHETERIZATION N/A 10/02/2014  ? Procedure: Left Heart Cath And Coronary Angiography;  Surgeon: Larey Dresser, MD;  Location: [place] [facility];  Service: Cardiovascular;  Laterality: N/A;  ? CARDIAC CATHETERIZATION N/A 02/17/2016  ? Procedure: Left Heart Cath and Coronary Angiography;  Surgeon: Larey Dresser, MD;  Location: [place] [facility];  Service: Cardiovascular;  Laterality: N/A;  ? CORONARY ARTERY BYPASS GRAFT N/A 03/01/2016  ? Procedure: CORONARY ARTERY BYPASS GRAFTING (CABG)x3 with endoscopic harvesting of right saphenous vein -LIMA to LAD -SVG to LEFT CIRCUMFLEX -RADIAL ARTERY to RAMUS INTERMEDIA;  Surgeon: Grace Isaac, MD;  Location: [place];  Service: Open Heart Surgery;  Laterality: N/A;  ? CORONARY STENT PLACEMENT  2005 AND REAPLCED 2006, 2014  ? CYSTOSCOPY/RETROGRADE/URETEROSCOPY/STONE EXTRACTION WITH BASKET Bilateral 08/07/2020  ? Procedure: CYSTOSCOPY/RETROGRADE/URETEROSCOPY, HOLMIUM LASER LITHOTRIPSY /STONE EXTRACTION WITH BASKET/ LEFT STENT  PLACEMENT;  Surgeon: [name], MD;  Location: [facility];  Service: Urology;  Laterality: Bilateral;  ? CYSTOSCOPY/URETEROSCOPY/HOLMIUM LASER/STENT PLACEMENT Bilateral 05/29/2018  ? Procedure: CYSTOSCOPY/RETROGRADE/URETEROSCOPY/HOLMIUM LASER/STENT PLACEMENT/ BASKET STONE EXTRACTION;  Surgeon: [name], MD;  Location: [facility];  Service: Urology;  Laterality: Bilateral;  ? EYE EXAMINATION UNDER ANESTHESIA W/ RETINAL CRYOTHERAPY AND RETINAL LASER Right 2018  ? HAND SURGERY Left 1990'S  ? hamick  bone rem  ? HOLMIUM LASER APPLICATION Bilateral 9/89/2119  ? Procedure: HOLMIUM LASER APPLICATION;  Surgeon: [name], MD;  Location: [facility];  Service: Urology;  Laterality: Bilateral;  ? KNEE ARTHROSCOPY Right 10/24/2014  ? Procedure: ARTHROSCOPY RIGHT KNEE, Partial medial menisectomy and chondroplasty;  Surgeon: Melrose Nakayama, MD;  Location: [place];  Service: Orthopedics;  Laterality: Right;  ? LEFT HEART CATH N/A 10/11/2012  ? Procedure: LEFT HEART CATH;  Surgeon: Sherren Mocha, MD;  Location: [facility] CATH LAB;  Service: Cardiovascular;  Laterality: N/A;  ? LITHOTRIPSY  EARLY 2000'S  ? X 2 OR 3  ? RADIAL ARTERY HARVEST Left 03/01/2016  ? Procedure: LEFT RADIAL ARTERY HARVEST;  Surgeon: Grace Isaac, MD;  Location: [place];  Service: Open Heart Surgery;  Laterality: Left;  ? SPLIT NIGHT STUDY  09/21/2015  ? TEE WITHOUT CARDIOVERSION N/A 03/01/2016  ? Procedure: TRANSESOPHAGEAL ECHOCARDIOGRAM (TEE);  Surgeon: Grace Isaac, MD;  Location: [place];  Service: Open Heart Surgery;  Laterality: N/A;  ? TONSILLECTOMY  AS CHILD  ? ? ? ?Allergies  ?Allergen Reactions  ? Altace [Ramipril] Shortness Of Breath and Other (See Comments)  ?  cough  ? Penicillins Swelling  ?  Has patient had a PCN reaction causing immediate rash, facial/tongue/throat swelling, SOB or lightheadedness with hypotension:unsure ?Has patient had a PCN reaction causing severe rash  involving mucus membranes or skin necrosis:unsure ?Has patient had a PCN reaction that required hospitalization:No ?Has patient had a PCN reaction occurring within the last 10 years:NO ?If all of the above answers

## 2021-09-23 NOTE — Progress Notes (Signed)
Informed patient of Dr. Ardis Hughs recommendations. Patient states he saw his cardiologist yesterday and he recommended patient hold nadolol for a week to see if his symptoms improve. Then to contact their office with an update. Patient states he would like to do what his cardiologist recommended at this time. ?

## 2021-10-26 ENCOUNTER — Other Ambulatory Visit: Payer: Self-pay

## 2021-10-26 DIAGNOSIS — K7460 Unspecified cirrhosis of liver: Secondary | ICD-10-CM

## 2021-11-04 ENCOUNTER — Ambulatory Visit (HOSPITAL_COMMUNITY)
Admission: RE | Admit: 2021-11-04 | Discharge: 2021-11-04 | Disposition: A | Discharge status: Home / Self Care | Payer: No Typology Code available for payment source | Source: Ambulatory Visit | Attending: Gastroenterology | Admitting: Gastroenterology

## 2021-11-04 DIAGNOSIS — K7460 Unspecified cirrhosis of liver: Secondary | ICD-10-CM | POA: Insufficient documentation

## 2021-12-08 ENCOUNTER — Other Ambulatory Visit: Payer: Self-pay | Admitting: Cardiology

## 2021-12-14 ENCOUNTER — Telehealth: Payer: Self-pay | Admitting: Cardiology

## 2021-12-14 MED ORDER — AMLODIPINE BESYLATE 5 MG PO TABS: 5.0000 mg | ORAL_TABLET | Freq: Every day | ORAL | 3 refills | Status: DC

## 2021-12-14 NOTE — Telephone Encounter (Signed)
*  STAT* If patient is at the pharmacy, call can be transferred to refill team.   1. Which medications need to be refilled? (please list name of each medication and dose if known)   amLODipine (NORVASC) 5 MG tablet  2. Which pharmacy/location (including street and city if local pharmacy) is medication to be sent to?  [place], [place]  3. Do they need a 30 day or 90 day supply?   90 days  Patient has 1 week of medication left and there are no refills remaining on the prescription.

## 2021-12-14 NOTE — Telephone Encounter (Signed)
Amlodipine 5 mg refill sent

## 2022-03-09 ENCOUNTER — Other Ambulatory Visit: Payer: Self-pay | Admitting: Cardiology

## 2022-04-04 ENCOUNTER — Ambulatory Visit: Payer: No Typology Code available for payment source | Attending: Cardiology | Admitting: Cardiology

## 2022-04-04 ENCOUNTER — Encounter: Payer: Self-pay | Admitting: Cardiology

## 2022-04-04 VITALS — BP 138/70 | HR 68 | Ht 71.0 in | Wt 248.0 lb

## 2022-04-04 DIAGNOSIS — E782 Mixed hyperlipidemia: Secondary | ICD-10-CM | POA: Diagnosis not present

## 2022-04-04 DIAGNOSIS — E119 Type 2 diabetes mellitus without complications: Secondary | ICD-10-CM | POA: Diagnosis not present

## 2022-04-04 DIAGNOSIS — I25118 Atherosclerotic heart disease of native coronary artery with other forms of angina pectoris: Secondary | ICD-10-CM | POA: Diagnosis not present

## 2022-04-04 DIAGNOSIS — K7460 Unspecified cirrhosis of liver: Secondary | ICD-10-CM | POA: Diagnosis not present

## 2022-04-04 DIAGNOSIS — I10 Essential (primary) hypertension: Secondary | ICD-10-CM

## 2022-04-04 MED ORDER — AMLODIPINE BESYLATE 10 MG PO TABS: 10.0000 mg | ORAL_TABLET | Freq: Every day | ORAL | 1 refills | Status: DC

## 2022-04-04 NOTE — Progress Notes (Signed)
Clinical Summary Hayden Rivera is a 71 y.o.male seen today for follow up of the following medical problems.    1. CAD - multiple PCIs, CABG in 02/2016 08/2017 nuclear stress no ischemia, large prior infarct  - committed to indefinite DAPT due to long history of significant disease and interventions       Jan 2023 nuclear stress: anterior scar. Inferior infarct with peri-infarct ischemia. LVEF 40%. SDS 4.  - started imdur. No recent symptoms.Previously had been having jaw pain and SOB   -last visit stopped imdur and started norvasc instead as antianginal, he wanted to be able to try viagra.  - did not start viagra, did stop imdur and start norvasc - no chest pains, can have some GERD like pain better with belching. Nonspecific jaw pain at times, not exertional - tolerates heavy yardwork without exertional symptoms   2. ICM - Jan 2019 echo LVEF 45-50%, akinesis mid-apicalanteroseptal wall. Grade II diastolic dysfunction - 07/8099 echo LVEF 50-55%, difficult to discern WMAs - no recent LE edema    Jan 2023 echo LVEF 50-55% - GI changed metoprolol to nadolol due to varicies No recent symptoms     3. Mild aortic stenosis Jan 2019 mild AS: mean grade 12, AVA VTI 1.81 - no recent symptoms.     12/2020 echo LVEF 50-55%, no significant AS mean grad 7 AVA TI 2.88       4. HTN -cough on ramipril. started on losartan - compliant withmeds     5. Hyperlipidemia Jan 2020 TC 131 TG 182 HDL 45 LDL 50 -  labs with [facility] and pcp   07/2019 TC 127 TG 472 LDL direct 39. - reports more recent labs at [facility]       6. DM2 -per pcp     7. Abnormal CT scan - recent CT scan from [place] suggested possible LV apical thrombus.  - with apical akinesis from prior echos certaintly has risk for thrombus - Jan 2023 echo no thrombus   8. Cirrhosis - followed by GI   SH: retired Scientist, product/process development Past Medical History:  Diagnosis Date   Anginal pain ([place])    occ   Aortic  stenosis 06/16/2017   Mild, noted on ECHO   Arthritis    Atrial fibrillation ([place]) 02/2016   post op   CAD (coronary artery disease)    a. Anterior STEMI 2005 c/b vfib arrest, PCI to LAD at [facility]. b. Stent thrombosis 2006 with DES within prior LAD stent at [facility]. C. 09/2012: s/p balloon angioplasty to mLAD for severe stenosis in previously stented segment & DES to LCx; initial enz neg but ruled in for NSTEMI after post-cath vagal sx, felt d/t distal emboliz of thrombus during case.   Cancer ([place])    skin ca melanoma, pt states was removed from back   Chronic back pain    Dependent edema    Mild, occ NONE RECENT   Diarrhea    DM type 2 (diabetes mellitus, type 2) (HCC)    diet controlled   Elevated hemidiaphragm    a. Noted 09/2012 - instructed to f/u PCP.   Heart murmur    History of kidney stones    Hyperlipidemia    Hypertension    Ischemic cardiomyopathy    a. Unclear prior EF but pt was told heart was weakened in past. b. EF normal 09/2012.   Myocardial infarction ([place]) 2005   TOTAL MI'S 4 LAST ONE 2017  Retinal tear, right    Ventricular fibrillation ([place])    a. VF arrest 2005 in setting of STEMI.   Wears glasses    Wears glasses    for reading     Allergies  Allergen Reactions   Altace [Ramipril] Shortness Of Breath and Other (See Comments)    cough   Penicillins Swelling    Has patient had a PCN reaction causing immediate rash, facial/tongue/throat swelling, SOB or lightheadedness with hypotension:unsure Has patient had a PCN reaction causing severe rash involving mucus membranes or skin necrosis:unsure Has patient had a PCN reaction that required hospitalization:No Has patient had a PCN reaction occurring within the last 10 years:NO If all of the above answers are "NO", then may proceed with Cephalosporin use.  Childhood reaction       Current Outpatient Medications  Medication Sig Dispense Refill   acetaminophen (TYLENOL) 500 MG tablet Take 500 mg by mouth  every 6 (six) hours as needed (PAIN).      amLODipine (NORVASC) 5 MG tablet Take 1 tablet (5 mg total) by mouth daily. 90 tablet 3   aspirin 81 MG tablet Take 1 tablet (81 mg total) by mouth daily.     cetirizine (ZYRTEC) 10 MG tablet Take 10 mg by mouth daily as needed.      Cholecalciferol (VITAMIN D) 50 MCG (2000 UT) tablet Take 2,000 Units by mouth daily.     clopidogrel (PLAVIX) 75 MG tablet TAKE 1 TABLET EVERY DAY 90 tablet 1   finasteride (PROSCAR) 5 MG tablet TAKES 1/4 TABLET IN PM     isosorbide mononitrate (IMDUR) 60 MG 24 hr tablet Take 1 tablet (60 mg total) by mouth daily. 90 tablet 3   losartan (COZAAR) 25 MG tablet TAKE 1 TABLET (25 MG TOTAL) BY MOUTH DAILY. 90 tablet 1   nadolol (CORGARD) 20 MG tablet Take 1 tablet (20 mg total) by mouth daily. 90 tablet 3   rosuvastatin (CRESTOR) 20 MG tablet TAKE 1 TABLET EVERY DAY 90 tablet 1   sildenafil (REVATIO) 20 MG tablet Take one tablet by mouth 30 minutes prior to intercourse. 30 tablet 3   TURMERIC PO Take 2 capsules by mouth daily.     UNABLE TO FIND Med Name: IMMODIUM PRN     Current Facility-Administered Medications  Medication Dose Route Frequency Provider Last Rate Last Admin   0.9 %  sodium chloride infusion  500 mL Intravenous Continuous Milus Banister, MD         Past Surgical History:  Procedure Laterality Date   BACK SURGERY  1997   UPPER CERVICAL C 3 TO C 4 FUSION   CARDIAC CATHETERIZATION     stents x2   CARDIAC CATHETERIZATION N/A 10/02/2014   Procedure: Left Heart Cath And Coronary Angiography;  Surgeon: Larey Dresser, MD;  Location: [place] INVASIVE CV LAB CUPID;  Service: Cardiovascular;  Laterality: N/A;   CARDIAC CATHETERIZATION N/A 02/17/2016   Procedure: Left Heart Cath and Coronary Angiography;  Surgeon: Larey Dresser, MD;  Location: [place] CV LAB;  Service: Cardiovascular;  Laterality: N/A;   CORONARY ARTERY BYPASS GRAFT N/A 03/01/2016   Procedure: CORONARY ARTERY BYPASS GRAFTING (CABG)x3 with  endoscopic harvesting of right saphenous vein -LIMA to LAD -SVG to LEFT CIRCUMFLEX -RADIAL ARTERY to RAMUS INTERMEDIA;  Surgeon: Grace Isaac, MD;  Location: [place];  Service: Open Heart Surgery;  Laterality: N/A;   CORONARY STENT PLACEMENT  2005 AND REAPLCED 2006, 2014   CYSTOSCOPY/RETROGRADE/URETEROSCOPY/STONE EXTRACTION WITH BASKET  Bilateral 08/07/2020   Procedure: CYSTOSCOPY/RETROGRADE/URETEROSCOPY, HOLMIUM LASER LITHOTRIPSY /STONE EXTRACTION WITH BASKET/ LEFT STENT PLACEMENT;  Surgeon: [name], MD;  Location: [facility];  Service: Urology;  Laterality: Bilateral;   CYSTOSCOPY/URETEROSCOPY/HOLMIUM LASER/STENT PLACEMENT Bilateral 05/29/2018   Procedure: CYSTOSCOPY/RETROGRADE/URETEROSCOPY/HOLMIUM LASER/STENT PLACEMENT/ BASKET STONE EXTRACTION;  Surgeon: [name], MD;  Location: [facility];  Service: Urology;  Laterality: Bilateral;   EYE EXAMINATION UNDER ANESTHESIA W/ RETINAL CRYOTHERAPY AND RETINAL LASER Right 2018   HAND SURGERY Left 1990'S   hamick bone rem   HOLMIUM LASER APPLICATION Bilateral 10/24/7822   Procedure: HOLMIUM LASER APPLICATION;  Surgeon: [name], MD;  Location: [facility];  Service: Urology;  Laterality: Bilateral;   KNEE ARTHROSCOPY Right 10/24/2014   Procedure: ARTHROSCOPY RIGHT KNEE, Partial medial menisectomy and chondroplasty;  Surgeon: Melrose Nakayama, MD;  Location: [place];  Service: Orthopedics;  Laterality: Right;   LEFT HEART CATH N/A 10/11/2012   Procedure: LEFT HEART CATH;  Surgeon: Sherren Mocha, MD;  Location: [facility] CATH LAB;  Service: Cardiovascular;  Laterality: N/A;   LITHOTRIPSY  EARLY 2000'S   X 2 OR 3   RADIAL ARTERY HARVEST Left 03/01/2016   Procedure: LEFT RADIAL ARTERY HARVEST;  Surgeon: Grace Isaac, MD;  Location: [place];  Service: Open Heart Surgery;  Laterality: Left;   SPLIT NIGHT STUDY  09/21/2015   TEE WITHOUT CARDIOVERSION N/A 03/01/2016   Procedure: TRANSESOPHAGEAL  ECHOCARDIOGRAM (TEE);  Surgeon: Grace Isaac, MD;  Location: [place];  Service: Open Heart Surgery;  Laterality: N/A;   TONSILLECTOMY  AS CHILD     Allergies  Allergen Reactions   Altace [Ramipril] Shortness Of Breath and Other (See Comments)    cough   Penicillins Swelling    Has patient had a PCN reaction causing immediate rash, facial/tongue/throat swelling, SOB or lightheadedness with hypotension:unsure Has patient had a PCN reaction causing severe rash involving mucus membranes or skin necrosis:unsure Has patient had a PCN reaction that required hospitalization:No Has patient had a PCN reaction occurring within the last 10 years:NO If all of the above answers are "NO", then may proceed with Cephalosporin use.  Childhood reaction        Family History  Problem Relation Age of Onset   Heart disease Mother    Stroke Father    Healthy Brother    Colon cancer Neg Hx    Esophageal cancer Neg Hx    Rectal cancer Neg Hx    Stomach cancer Neg Hx      Social History Hayden Rivera reports that he quit smoking about 19 years ago. His smoking use included cigars and cigarettes. He has a 5.00 pack-year smoking history. He has never used smokeless tobacco. Hayden Rivera reports that he does not currently use alcohol.   Review of Systems CONSTITUTIONAL: No weight loss, fever, chills, weakness or fatigue.  HEENT: Eyes: No visual loss, blurred vision, double vision or yellow sclerae.No hearing loss, sneezing, congestion, runny nose or sore throat.  SKIN: No rash or itching.  CARDIOVASCULAR: per hpi RESPIRATORY: No shortness of breath, cough or sputum.  GASTROINTESTINAL: No anorexia, nausea, vomiting or diarrhea. No abdominal pain or blood.  GENITOURINARY: No burning on urination, no polyuria NEUROLOGICAL: No headache, dizziness, syncope, paralysis, ataxia, numbness or tingling in the extremities. No change in bowel or bladder control.  MUSCULOSKELETAL: No muscle, back pain, joint  pain or stiffness.  LYMPHATICS: No enlarged nodes. No history of splenectomy.  PSYCHIATRIC: No history of depression or anxiety.  ENDOCRINOLOGIC: No reports of sweating, cold or heat intolerance. No polyuria or polydipsia.  Marland Kitchen   Physical Examination Today's Vitals   04/04/22 0924  BP: 138/70  Pulse: 68  SpO2: 97%  Weight: 248 lb (112.5 kg)  Height: '5\' 11"'$  (1.803 m)   Body mass index is 34.59 kg/m.  Gen: resting comfortably, no acute distress HEENT: no scleral icterus, pupils equal round and reactive, no palptable cervical adenopathy,  CV: RRR, 2/6 systolic murmur rusb, no jvd Resp: Clear to auscultation bilaterally GI: abdomen is soft, non-tender, non-distended, normal bowel sounds, no hepatosplenomegaly MSK: extremities are warm, no edema.  Skin: warm, no rash Neuro:  no focal deficits Psych: appropriate affect   Diagnostic Studies  08/2017 nuclear stress Nuclear stress EF: 48%. Apical and anteroseptal wall hypokinesis There was no ST segment deviation noted during stress. Defect 1: There is a large defect of severe severity present in the mid anteroseptal, mid inferoseptal, mid inferior, mid inferolateral, apical anterior, apical septal, apical inferior, apical lateral and apex location. This is an intermediate risk study. Appears to be large prior infarct pattern as described above. No ischemia identified.   Jan 2019 echo Study Conclusions   - Left ventricle: The cavity size was normal. Wall thickness was   increased in a pattern of mild LVH. There was mild focal basal   hypertrophy of the septum. Systolic function was mildly reduced.   The estimated ejection fraction was in the range of 45% to 50%.   There is akinesis of the mid-apicalanteroseptal myocardium.   Features are consistent with a pseudonormal left ventricular   filling pattern, with concomitant abnormal relaxation and   increased filling pressure (grade 2 diastolic dysfunction). - Aortic valve: Valve  mobility was restricted. There was mild   stenosis. There was trivial regurgitation. - Ascending aorta: The ascending aorta was mildly dilated. - Pulmonary arteries: Systolic pressure was mildly increased. PA   peak pressure: 36 mm Hg (S).   Impressions:   - Akinesis of the distal septum/apex; overall mildly reduced LV   systolic function; moderate diastolic dysfunction; calcified   aortic valve with fixed noncoronary cusp; mild AS (mean gradient   12 mmHg) and trace AI; milldy dilated ascending aorta; mild TR   with mildly elevated pulmonary pressure   12/2020 echo 1. Endocardium poorly visualized, apex appears to be hypokinetic. Marland Kitchen Left  ventricular ejection fraction, by estimation, is 50 to 55%. The left  ventricle has low normal function. Left ventricular endocardial border not  optimally defined to evaluate  regional wall motion. There is severe left ventricular hypertrophy. Left  ventricular diastolic parameters are indeterminate.   2. Right ventricular systolic function is normal. The right ventricular  size is normal.   3. Left atrial size was moderately dilated.   4. The mitral valve is normal in structure. No evidence of mitral valve  regurgitation. No evidence of mitral stenosis.   5. The aortic valve is tricuspid. Aortic valve regurgitation is mild. No  aortic stenosis is present.   6. The inferior vena cava is normal in size with greater than 50%  respiratory variability, suggesting right atrial pressure of 3 mmHg.   Jan 2023 echo IMPRESSIONS     1. Left ventricular ejection fraction, by estimation, is 50 to 55%. The  left ventricle has low normal function. The left ventricle demonstrates  regional wall motion abnormalities (see scoring diagram/findings for  description). There is moderate  asymmetric left ventricular hypertrophy of the basal-septal segment.   2.  No LV thrombus seen   3. Right ventricular systolic function is normal. The right ventricular  size is  normal.   4. The mitral valve is normal in structure. No evidence of mitral valve  regurgitation. No evidence of mitral stenosis.   5. The aortic valve was not well visualized. Aortic valve regurgitation  is trivial. No aortic stenosis is present.      Assessment and Plan         Clinical Summary Hayden Rivera is a 71 y.o.male seen today for follow up of the following medical problems.    1. CAD - multiple PCIs, CABG in 02/2016 08/2017 nuclear stress no ischemia, large prior infarct  - committed to indefinite DAPT due to long history of significant disease and interventions       Jan 2023 nuclear stress: anterior scar. Inferior infarct with peri-infarct ischemia. LVEF 40%. SDS 4.  - started imdur. No recent symptoms.Previously had been having jaw pain and SOB - wants to try viagra and possibly come off imdur.    2. ICM - Jan 2019 echo LVEF 45-50%, akinesis mid-apicalanteroseptal wall. Grade II diastolic dysfunction - 01/2118 echo LVEF 50-55%, difficult to discern WMAs - no recent LE edema    Jan 2023 echo LVEF 50-55% - GI changed metoprolol to nadolol due to varicies -since change reports fatigue.      3. Mild aortic stenosis Jan 2019 mild AS: mean grade 12, AVA VTI 1.81 - no recent symptoms.     12/2020 echo LVEF 50-55%, no significant AS mean grad 7 AVA TI 2.88       4. HTN -cough on ramipril. Previuosly had not wanted to try losartan.  - compliant with meds   - started on losartan     5. Hyperlipidemia Jan 2020 TC 131 TG 182 HDL 45 LDL 50 -  labs with [facility] and pcp   07/2019 TC 127 TG 472 LDL direct 39.       6. DM2 - had been on metformin, he reports sugars improved and he reports pcp stopped metformin.      7. Abnormal CT scan - recent CT scan from [place] suggested possible LV apical thrombus.  - with apical akinesis from prior echos certaintly has risk for thrombus - Jan 2023 echo no thrombus   8. Cirrhosis - followed by GI   SH: retired  Scientist, product/process development         Past Medical History:  Diagnosis Date   Anginal pain ([place])      occ   Aortic stenosis 06/16/2017    Mild, noted on ECHO   Arthritis     Atrial fibrillation ([place]) 02/2016    post op   CAD (coronary artery disease)      a. Anterior STEMI 2005 c/b vfib arrest, PCI to LAD at [facility]. b. Stent thrombosis 2006 with DES within prior LAD stent at [facility]. C. 09/2012: s/p balloon angioplasty to mLAD for severe stenosis in previously stented segment & DES to LCx; initial enz neg but ruled in for NSTEMI after post-cath vagal sx, felt d/t distal emboliz of thrombus during case.   Cancer ([place])      skin ca melanoma, pt states was removed from back   Chronic back pain     Dependent edema      Mild, occ NONE RECENT   Diarrhea     DM type 2 (diabetes mellitus, type 2) (HCC)      diet controlled   Elevated hemidiaphragm  a. Noted 09/2012 - instructed to f/u PCP.   Heart murmur     History of kidney stones     Hyperlipidemia     Hypertension     Ischemic cardiomyopathy      a. Unclear prior EF but pt was told heart was weakened in past. b. EF normal 09/2012.   Myocardial infarction ([place]) 2005    TOTAL MI'S 4 LAST ONE 2017   Retinal tear, right     Ventricular fibrillation ([place])      a. VF arrest 2005 in setting of STEMI.   Wears glasses     Wears glasses      for reading             Allergies  Allergen Reactions   Altace [Ramipril] Shortness Of Breath and Other (See Comments)      cough   Penicillins Swelling      Has patient had a PCN reaction causing immediate rash, facial/tongue/throat swelling, SOB or lightheadedness with hypotension:unsure Has patient had a PCN reaction causing severe rash involving mucus membranes or skin necrosis:unsure Has patient had a PCN reaction that required hospitalization:No Has patient had a PCN reaction occurring within the last 10 years:NO If all of the above answers are "NO", then may proceed with Cephalosporin use.    Childhood reaction                  Current Outpatient Medications  Medication Sig Dispense Refill   acetaminophen (TYLENOL) 500 MG tablet Take 500 mg by mouth every 6 (six) hours as needed (PAIN).        aspirin 81 MG tablet Take 1 tablet (81 mg total) by mouth daily.       cetirizine (ZYRTEC) 10 MG tablet Take 10 mg by mouth daily as needed.        clopidogrel (PLAVIX) 75 MG tablet TAKE 1 TABLET EVERY DAY 90 tablet 1   finasteride (PROSCAR) 5 MG tablet TAKES 1/4 TABLET IN PM       isosorbide mononitrate (IMDUR) 60 MG 24 hr tablet Take 1 tablet (60 mg total) by mouth daily. 90 tablet 3   losartan (COZAAR) 25 MG tablet Take 1 tablet (25 mg total) by mouth daily. 90 tablet 3   nadolol (CORGARD) 20 MG tablet Take 1 tablet (20 mg total) by mouth daily. 90 tablet 3   rosuvastatin (CRESTOR) 20 MG tablet TAKE 1 TABLET EVERY DAY 90 tablet 1   TURMERIC PO Take 2 capsules by mouth daily.       UNABLE TO FIND Med Name: IMMODIUM PRN                 Current Facility-Administered Medications  Medication Dose Route Frequency Provider Last Rate Last Admin   0.9 %  sodium chloride infusion  500 mL Intravenous Continuous Milus Banister, MD                 Past Surgical History:  Procedure Laterality Date   BACK SURGERY   1997    UPPER CERVICAL C 3 TO C 4 FUSION   CARDIAC CATHETERIZATION        stents x2   CARDIAC CATHETERIZATION N/A 10/02/2014    Procedure: Left Heart Cath And Coronary Angiography;  Surgeon: Larey Dresser, MD;  Location: [facility] INVASIVE CV LAB CUPID;  Service: Cardiovascular;  Laterality: N/A;   CARDIAC CATHETERIZATION N/A 02/17/2016    Procedure: Left Heart Cath and Coronary Angiography;  Surgeon: Elby Showers  Aundra Dubin, MD;  Location: [place] CV LAB;  Service: Cardiovascular;  Laterality: N/A;   CORONARY ARTERY BYPASS GRAFT N/A 03/01/2016    Procedure: CORONARY ARTERY BYPASS GRAFTING (CABG)x3 with endoscopic harvesting of right saphenous vein -LIMA to LAD -SVG to LEFT CIRCUMFLEX  -RADIAL ARTERY to RAMUS INTERMEDIA;  Surgeon: Grace Isaac, MD;  Location: [place];  Service: Open Heart Surgery;  Laterality: N/A;   CORONARY STENT PLACEMENT   2005 AND REAPLCED 2006, 2014   CYSTOSCOPY/RETROGRADE/URETEROSCOPY/STONE EXTRACTION WITH BASKET Bilateral 08/07/2020    Procedure: CYSTOSCOPY/RETROGRADE/URETEROSCOPY, HOLMIUM LASER LITHOTRIPSY /STONE EXTRACTION WITH BASKET/ LEFT STENT PLACEMENT;  Surgeon: [name], MD;  Location: [facility];  Service: Urology;  Laterality: Bilateral;   CYSTOSCOPY/URETEROSCOPY/HOLMIUM LASER/STENT PLACEMENT Bilateral 05/29/2018    Procedure: CYSTOSCOPY/RETROGRADE/URETEROSCOPY/HOLMIUM LASER/STENT PLACEMENT/ BASKET STONE EXTRACTION;  Surgeon: [name], MD;  Location: [facility];  Service: Urology;  Laterality: Bilateral;   EYE EXAMINATION UNDER ANESTHESIA W/ RETINAL CRYOTHERAPY AND RETINAL LASER Right 2018   HAND SURGERY Left 1990'S    hamick bone rem   HOLMIUM LASER APPLICATION Bilateral 7/82/9562    Procedure: HOLMIUM LASER APPLICATION;  Surgeon: [name], MD;  Location: [facility];  Service: Urology;  Laterality: Bilateral;   KNEE ARTHROSCOPY Right 10/24/2014    Procedure: ARTHROSCOPY RIGHT KNEE, Partial medial menisectomy and chondroplasty;  Surgeon: Melrose Nakayama, MD;  Location: [place];  Service: Orthopedics;  Laterality: Right;   LEFT HEART CATH N/A 10/11/2012    Procedure: LEFT HEART CATH;  Surgeon: Sherren Mocha, MD;  Location: [facility] CATH LAB;  Service: Cardiovascular;  Laterality: N/A;   LITHOTRIPSY   EARLY 2000'S    X 2 OR 3   RADIAL ARTERY HARVEST Left 03/01/2016    Procedure: LEFT RADIAL ARTERY HARVEST;  Surgeon: Grace Isaac, MD;  Location: [place];  Service: Open Heart Surgery;  Laterality: Left;   SPLIT NIGHT STUDY   09/21/2015   TEE WITHOUT CARDIOVERSION N/A 03/01/2016    Procedure: TRANSESOPHAGEAL ECHOCARDIOGRAM (TEE);  Surgeon: Grace Isaac, MD;  Location: [place];   Service: Open Heart Surgery;  Laterality: N/A;   TONSILLECTOMY   AS CHILD             Allergies  Allergen Reactions   Altace [Ramipril] Shortness Of Breath and Other (See Comments)      cough   Penicillins Swelling      Has patient had a PCN reaction causing immediate rash, facial/tongue/throat swelling, SOB or lightheadedness with hypotension:unsure Has patient had a PCN reaction causing severe rash involving mucus membranes or skin necrosis:unsure Has patient had a PCN reaction that required hospitalization:No Has patient had a PCN reaction occurring within the last 10 years:NO If all of the above answers are "NO", then may proceed with Cephalosporin use.   Childhood reaction                   Family History  Problem Relation Age of Onset   Heart disease Mother     Stroke Father     Healthy Brother     Colon cancer Neg Hx     Esophageal cancer Neg Hx     Rectal cancer Neg Hx     Stomach cancer Neg Hx          Social History Hayden Rivera reports that he quit smoking about 19 years ago. His smoking use included cigars and cigarettes. He has a 5.00 pack-year smoking history. He has never used smokeless tobacco. Mr.  Rivera reports that he does not currently use alcohol.     Review of Systems CONSTITUTIONAL: No weight loss, fever, chills, weakness or fatigue.  HEENT: Eyes: No visual loss, blurred vision, double vision or yellow sclerae.No hearing loss, sneezing, congestion, runny nose or sore throat.  SKIN: No rash or itching.  CARDIOVASCULAR: per hpi RESPIRATORY: No shortness of breath, cough or sputum.  GASTROINTESTINAL: No anorexia, nausea, vomiting or diarrhea. No abdominal pain or blood.  GENITOURINARY: No burning on urination, no polyuria NEUROLOGICAL: No headache, dizziness, syncope, paralysis, ataxia, numbness or tingling in the extremities. No change in bowel or bladder control.  MUSCULOSKELETAL: No muscle, back pain, joint pain or stiffness.  LYMPHATICS: No  enlarged nodes. No history of splenectomy.  PSYCHIATRIC: No history of depression or anxiety.  ENDOCRINOLOGIC: No reports of sweating, cold or heat intolerance. No polyuria or polydipsia.  Marland Kitchen     Physical Examination    Today's Vitals    09/22/21 0937  BP: 116/60  Pulse: (!) 50  SpO2: 98%  Weight: 251 lb 6.4 oz (114 kg)  Height: '5\' 11"'$  (1.803 m)    Body mass index is 35.06 kg/m.   Gen: resting comfortably, no acute distress HEENT: no scleral icterus, pupils equal round and reactive, no palptable cervical adenopathy,  CV: RRR, 2/6 systolic murmur rusb, no jvd Resp: Clear to auscultation bilaterally GI: abdomen is soft, non-tender, non-distended, normal bowel sounds, no hepatosplenomegaly MSK: extremities are warm, no edema.  Skin: warm, no rash Neuro:  no focal deficits Psych: appropriate affect     Diagnostic Studies   08/2017 nuclear stress Nuclear stress EF: 48%. Apical and anteroseptal wall hypokinesis There was no ST segment deviation noted during stress. Defect 1: There is a large defect of severe severity present in the mid anteroseptal, mid inferoseptal, mid inferior, mid inferolateral, apical anterior, apical septal, apical inferior, apical lateral and apex location. This is an intermediate risk study. Appears to be large prior infarct pattern as described above. No ischemia identified.   Jan 2019 echo Study Conclusions   - Left ventricle: The cavity size was normal. Wall thickness was   increased in a pattern of mild LVH. There was mild focal basal   hypertrophy of the septum. Systolic function was mildly reduced.   The estimated ejection fraction was in the range of 45% to 50%.   There is akinesis of the mid-apicalanteroseptal myocardium.   Features are consistent with a pseudonormal left ventricular   filling pattern, with concomitant abnormal relaxation and   increased filling pressure (grade 2 diastolic dysfunction). - Aortic valve: Valve mobility was  restricted. There was mild   stenosis. There was trivial regurgitation. - Ascending aorta: The ascending aorta was mildly dilated. - Pulmonary arteries: Systolic pressure was mildly increased. PA   peak pressure: 36 mm Hg (S).   Impressions:   - Akinesis of the distal septum/apex; overall mildly reduced LV   systolic function; moderate diastolic dysfunction; calcified   aortic valve with fixed noncoronary cusp; mild AS (mean gradient   12 mmHg) and trace AI; milldy dilated ascending aorta; mild TR   with mildly elevated pulmonary pressure   12/2020 echo 1. Endocardium poorly visualized, apex appears to be hypokinetic. Marland Kitchen Left  ventricular ejection fraction, by estimation, is 50 to 55%. The left  ventricle has low normal function. Left ventricular endocardial border not  optimally defined to evaluate  regional wall motion. There is severe left ventricular hypertrophy. Left  ventricular diastolic parameters are indeterminate.  2. Right ventricular systolic function is normal. The right ventricular  size is normal.   3. Left atrial size was moderately dilated.   4. The mitral valve is normal in structure. No evidence of mitral valve  regurgitation. No evidence of mitral stenosis.   5. The aortic valve is tricuspid. Aortic valve regurgitation is mild. No  aortic stenosis is present.   6. The inferior vena cava is normal in size with greater than 50%  respiratory variability, suggesting right atrial pressure of 3 mmHg.   Jan 2023 echo IMPRESSIONS     1. Left ventricular ejection fraction, by estimation, is 50 to 55%. The  left ventricle has low normal function. The left ventricle demonstrates  regional wall motion abnormalities (see scoring diagram/findings for  description). There is moderate  asymmetric left ventricular hypertrophy of the basal-septal segment.   2. No LV thrombus seen   3. Right ventricular systolic function is normal. The right ventricular  size is normal.    4. The mitral valve is normal in structure. No evidence of mitral valve  regurgitation. No evidence of mitral stenosis.   5. The aortic valve was not well visualized. Aortic valve regurgitation  is trivial. No aortic stenosis is present.      Assessment and Plan    1. CAD with stable angina/ ICM - committed to indefinite DAPT due to long history of significant disease and interventions - recent left jaw pain and some SOB/DOE, no specific chest pain - recent stress test mild peri-infarct ischemia. - will increase norvasc to '10mg'$  daily EKG SR, PACs. No acute ischemic changes  2. HTN  Above goal, increase norvasc to '10mg'$  daily  3. Hyperlipidemia -request labs from pcp   Reprts recent cardiac monitor at [facility], will request results         Arnoldo Lenis, M.D.

## 2022-04-04 NOTE — Patient Instructions (Signed)
Medication Instructions:  Your physician has recommended you make the following change in your medication:  Increase amlodipine to 10 mg once a day Continue all other medications as directed  Labwork: none  Testing/Procedures: none  Follow-Up:  Your physician recommends that you schedule a follow-up appointment in: 6 months  Any Other Special Instructions Will Be Listed Below (If Applicable).  If you need a refill on your cardiac medications before your next appointment, please call your pharmacy.

## 2022-04-20 ENCOUNTER — Other Ambulatory Visit: Payer: Self-pay | Admitting: Cardiology

## 2022-04-29 ENCOUNTER — Ambulatory Visit: Payer: No Typology Code available for payment source | Admitting: Gastroenterology

## 2022-05-18 ENCOUNTER — Other Ambulatory Visit: Payer: Self-pay | Admitting: Cardiology

## 2022-05-19 ENCOUNTER — Telehealth: Payer: Self-pay

## 2022-05-19 DIAGNOSIS — R932 Abnormal findings on diagnostic imaging of liver and biliary tract: Secondary | ICD-10-CM

## 2022-05-19 DIAGNOSIS — K7460 Unspecified cirrhosis of liver: Secondary | ICD-10-CM

## 2022-05-19 NOTE — Telephone Encounter (Signed)
Sounds good. Thanks. GM

## 2022-05-19 NOTE — Telephone Encounter (Signed)
Dr Rush Landmark,  This is a patient of Dr Ardis Hughs, but he will be seeing you on 06-03-22 in clinic.  He is due to have [place] screening in December.  Is this OK to order under your name as he will be seeing you soon?  Please advise.

## 2022-05-19 NOTE — Telephone Encounter (Signed)
Patient aware that he is scheduled for RUQ abdominal ultrasound on 05-25-22 at [facility] at 10:30am.  Patient requested reminder be sent in [place]. MyChart message sent.

## 2022-05-25 ENCOUNTER — Ambulatory Visit (HOSPITAL_COMMUNITY)
Admission: RE | Admit: 2022-05-25 | Discharge: 2022-05-25 | Disposition: A | Discharge status: Home / Self Care | Payer: No Typology Code available for payment source | Source: Ambulatory Visit | Attending: Gastroenterology | Admitting: Gastroenterology

## 2022-05-25 DIAGNOSIS — R932 Abnormal findings on diagnostic imaging of liver and biliary tract: Secondary | ICD-10-CM | POA: Diagnosis present

## 2022-05-25 DIAGNOSIS — K7460 Unspecified cirrhosis of liver: Secondary | ICD-10-CM | POA: Insufficient documentation

## 2022-06-03 ENCOUNTER — Other Ambulatory Visit (INDEPENDENT_AMBULATORY_CARE_PROVIDER_SITE_OTHER): Payer: No Typology Code available for payment source

## 2022-06-03 ENCOUNTER — Ambulatory Visit (INDEPENDENT_AMBULATORY_CARE_PROVIDER_SITE_OTHER): Payer: No Typology Code available for payment source | Admitting: Gastroenterology

## 2022-06-03 ENCOUNTER — Encounter: Payer: Self-pay | Admitting: Gastroenterology

## 2022-06-03 VITALS — BP 124/72 | HR 86 | Ht 71.0 in | Wt 253.0 lb

## 2022-06-03 DIAGNOSIS — I8500 Esophageal varices without bleeding: Secondary | ICD-10-CM

## 2022-06-03 DIAGNOSIS — K7460 Unspecified cirrhosis of liver: Secondary | ICD-10-CM

## 2022-06-03 DIAGNOSIS — I8510 Secondary esophageal varices without bleeding: Secondary | ICD-10-CM

## 2022-06-03 DIAGNOSIS — K838 Other specified diseases of biliary tract: Secondary | ICD-10-CM

## 2022-06-03 LAB — COMPREHENSIVE METABOLIC PANEL
ALT: 45 U/L (ref 0–53)
AST: 34 U/L (ref 0–37)
Albumin: 4.7 g/dL (ref 3.5–5.2)
Alkaline Phosphatase: 91 U/L (ref 39–117)
BUN: 14 mg/dL (ref 6–23)
CO2: 24 mEq/L (ref 19–32)
Calcium: 9.5 mg/dL (ref 8.4–10.5)
Chloride: 104 mEq/L (ref 96–112)
Creatinine, Ser: 1.16 mg/dL (ref 0.40–1.50)
GFR: 63.08 mL/min (ref >60.00)
Glucose, Bld: 230 mg/dL — ABNORMAL HIGH (ref 70–99)
Potassium: 3.8 mEq/L (ref 3.5–5.1)
Sodium: 139 mEq/L (ref 135–145)
Total Bilirubin: 1 mg/dL (ref 0.2–1.2)
Total Protein: 7.6 g/dL (ref 6.0–8.3)

## 2022-06-03 LAB — CBC
HCT: 41.6 % (ref 39.0–52.0)
Hemoglobin: 14.3 g/dL (ref 13.0–17.0)
MCHC: 34.3 g/dL (ref 30.0–36.0)
MCV: 95.5 fl (ref 78.0–100.0)
Platelets: 123 10*3/uL — ABNORMAL LOW (ref 150.0–400.0)
RBC: 4.36 Mil/uL (ref 4.22–5.81)
RDW: 13.6 % (ref 11.5–15.5)
WBC: 7.8 10*3/uL (ref 4.0–10.5)

## 2022-06-03 LAB — PROTIME-INR
INR: 1.7 ratio — ABNORMAL HIGH (ref 0.8–1.0)
Prothrombin Time: 18.5 s — ABNORMAL HIGH (ref 9.6–13.1)

## 2022-06-03 LAB — FERRITIN: Ferritin: 129.9 ng/mL (ref 22.0–322.0)

## 2022-06-03 LAB — TSH: TSH: 2.21 u[IU]/mL (ref 0.35–5.50)

## 2022-06-03 MED ORDER — PROPRANOLOL HCL 10 MG PO TABS: 10.0000 mg | ORAL_TABLET | Freq: Two times a day (BID) | ORAL | 0 refills | Status: AC

## 2022-06-03 NOTE — Patient Instructions (Addendum)
Start Propranolol -1 tablet once daily for 1 week, then increase to 1 tablet twice daily.   We have sent the following medications to your pharmacy for you to pick up at your convenience: Propranolol   Your provider has requested that you go to the basement level for lab work before leaving today. Press "B" on the elevator. The lab is located at the first door on the left as you exit the elevator.  Repeat abdomen ultrasound in 6 months ( 12/02/22). You will be contact to schedule that at a later time.   Due to recent changes in healthcare laws, you may see the results of your imaging and laboratory studies on MyChart before your provider has had a chance to review them.  We understand that in some cases there may be results that are confusing or concerning to you. Not all laboratory results come back in the same time frame and the provider may be waiting for multiple results in order to interpret others.  Please give [place] 48 hours in order for your provider to thoroughly review all the results before contacting the office for clarification of your results.   Thank you for choosing me and [facility].  Dr. Rush Landmark

## 2022-06-03 NOTE — Progress Notes (Unsigned)
[facility] VISIT   Primary Care Provider Chevis Pretty, [place] [address] [phone]  Referring Provider Chevis Pretty, [place],  [place] [address] [phone]  Patient Profile: Hayden Rivera is a 72 y.o. male with a pmh significant for  The patient presents to the [facility] for an evaluation and management of problem(s) noted below:  Problem List No diagnosis found.  History of Present Illness    The patient does/does not take NSAIDs or BC/Goody Powder. Patient has/has not had an EGD. Patient has/has not had a Colonoscopy.  GI Review of Systems Positive as above Negative for  Pyrosis; Reflux; Regurgitation; Dysphagia; Odynophagia; Globus; Post-prandial cough; Nocturnal cough; Nasal regurgitation; Epigastric pain; Nausea; Vomiting; Hematemesis; Jaundice; Change in Appetite; Early satiety; Abdominal pain; Abdominal bloating; Eructation; Flatulence; Change in BM Frequency; Change in BM Consistency; Constipation; Diarrhea; Incontinence; Urgency; Tenesmus; Hematochezia; Melena  Review of Systems General: Denies fevers/chills/weight loss/night sweats HEENT: Denies oral lesions/sore throat/headaches/visual changes Cardiovascular: Denies chest pain/palpitations Pulmonary: Denies shortness of breath/cough Gastroenterological: See HPI Genitourinary: Denies darkened urine or hematuria Hematological: Denies easy bruising/bleeding Endocrine: Denies temperature intolerance Dermatological: Denies skin changes Psychological: Mood is stable Allergy & Immunology: Denies severe allergic reactions Musculoskeletal: Denies new arthralgias   Medications Current Outpatient Medications  Medication Sig Dispense Refill   acetaminophen (TYLENOL) 500 MG tablet Take 500 mg by mouth every 6 (six) hours as needed (PAIN).      amLODipine (NORVASC) 10 MG tablet Take 1 tablet (10 mg  total) by mouth daily. 90 tablet 1   apixaban (ELIQUIS) 5 MG TABS tablet Take 5 mg by mouth 2 (two) times daily.     cetirizine (ZYRTEC) 10 MG tablet Take 10 mg by mouth daily as needed.      Cholecalciferol (VITAMIN D) 50 MCG (2000 UT) tablet Take 2,000 Units by mouth daily.     clopidogrel (PLAVIX) 75 MG tablet TAKE 1 TABLET EVERY DAY 90 tablet 3   finasteride (PROSCAR) 5 MG tablet Take 2.5 mg by mouth daily.     losartan (COZAAR) 25 MG tablet TAKE 1 TABLET (25 MG TOTAL) BY MOUTH DAILY. 90 tablet 1   rosuvastatin (CRESTOR) 20 MG tablet TAKE 1 TABLET EVERY DAY 90 tablet 1   TURMERIC PO Take 2 capsules by mouth daily.     UNABLE TO FIND Med Name: IMMODIUM PRN     Current Facility-Administered Medications  Medication Dose Route Frequency Provider Last Rate Last Admin   0.9 %  sodium chloride infusion  500 mL Intravenous Continuous Milus Banister, MD        Allergies Allergies  Allergen Reactions   Altace [Ramipril] Shortness Of Breath and Other (See Comments)    cough   Penicillins Swelling    Has patient had a PCN reaction causing immediate rash, facial/tongue/throat swelling, SOB or lightheadedness with hypotension:unsure Has patient had a PCN reaction causing severe rash involving mucus membranes or skin necrosis:unsure Has patient had a PCN reaction that required hospitalization:No Has patient had a PCN reaction occurring within the last 10 years:NO If all of the above answers are "NO", then may proceed with Cephalosporin use.  Childhood reaction      Histories Past Medical History:  Diagnosis Date   Anginal pain ([place])    occ   Aortic stenosis 06/16/2017   Mild, noted on ECHO   Arthritis    Atrial fibrillation ([place]) 02/2016   post op   CAD (coronary  artery disease)    a. Anterior STEMI 2005 c/b vfib arrest, PCI to LAD at [facility]. b. Stent thrombosis 2006 with DES within prior LAD stent at [facility]. 09/2012: s/p balloon angioplasty to mLAD for severe stenosis in  previously stented segment & DES to LCx; initial enz neg but ruled in for NSTEMI after post-cath vagal sx, felt d/t distal emboliz of thrombus during case.   Cancer ([place])    skin ca melanoma, pt states was removed from back   Chronic back pain    Dependent edema    Mild, occ NONE RECENT   Diarrhea    DM type 2 (diabetes mellitus, type 2) (HCC)    diet controlled   Elevated hemidiaphragm    a. Noted 09/2012 - instructed to f/u PCP.   Heart murmur    History of kidney stones    Hyperlipidemia    Hypertension    Ischemic cardiomyopathy    a. Unclear prior EF but pt was told heart was weakened in past. b. EF normal 09/2012.   Myocardial infarction ([place]) 2005   TOTAL MI'S 4 LAST ONE 2017   Retinal tear, right    Ventricular fibrillation ([place])    a. VF arrest 2005 in setting of STEMI.   Wears glasses    Wears glasses    for reading   Past Surgical History:  Procedure Laterality Date   [place] 3 TO C 4 FUSION   CARDIAC CATHETERIZATION     stents x2   CARDIAC CATHETERIZATION N/A 10/02/2014   Procedure: Left Heart Cath And Coronary Angiography;  Surgeon: Larey Dresser, MD;  Location: [place] [facility]VASIVE CV LAB CUP[facility];  Service: Cardiovascular;  Laterality: N/A;   CARDIAC CATHETERIZATION N/A 02/17/2016   Procedure: Left Heart Cath and Coronary Angiography;  Surgeon: Larey Dresser, MD;  Location: [place] CV LAB;  Service: Cardiovascular;  Laterality: N/A;   CORONARY ARTERY BYPASS GRAFT N/A 03/01/2016   Procedure: CORONARY ARTERY BYPASS GRAFTING (CABG)x3 with endoscopic harvesting of right saphenous vein -LIMA to LAD -SVG to LEFT CIRCUMFLEX -RADIAL ARTERY to RAMUS INTERMEDIA;  Surgeon: Grace Isaac, MD;  Location: [place];  Service: Open Heart Surgery;  Laterality: N/A;   CORONARY STENT PLACEMENT  2005 AND REAPLCED 2006, 2014   CYSTOSCOPY/RETROGRADE/URETEROSCOPY/STONE EXTRACTION WITH BASKET Bilateral 08/07/2020   Procedure: CYSTOSCOPY/RETROGRADE/URETEROSCOPY,  HOLMIUM LASER LITHOTRIPSY /STONE EXTRACTION WITH BASKET/ LEFT STENT PLACEMENT;  Surgeon: [name], MD;  Location: [facility];  Service: Urology;  Laterality: Bilateral;   CYSTOSCOPY/URETEROSCOPY/HOLMIUM LASER/STENT PLACEMENT Bilateral 05/29/2018   Procedure: CYSTOSCOPY/RETROGRADE/URETEROSCOPY/HOLMIUM LASER/STENT PLACEMENT/ BASKET STONE EXTRACTION;  Surgeon: [name], MD;  Location: [facility];  Service: Urology;  Laterality: Bilateral;   EYE EXAMINATION UNDER ANESTHESIA W/ RETINAL CRYOTHERAPY AND RETINAL LASER Right 2018   HAND SURGERY Left 1990'S   hamick bone rem   HOLMIUM LASER APPLICATION Bilateral 11/16/3557   Procedure: HOLMIUM LASER APPLICATION;  Surgeon: [name], MD;  Location: [facility];  Service: Urology;  Laterality: Bilateral;   KNEE ARTHROSCOPY Right 10/24/2014   Procedure: ARTHROSCOPY RIGHT KNEE, Partial medial menisectomy and chondroplasty;  Surgeon: Melrose Nakayama, MD;  Location: [facility];  Service: Orthopedics;  Laterality: Right;   LEFT HEART CATH N/A 10/11/2012   Procedure: LEFT HEART CATH;  Surgeon: Sherren Mocha, MD;  Location: [facility] CATH LAB;  Service: Cardiovascular;  Laterality: N/A;   LITHOTRIPSY  EARLY 2000'S   X 2 OR 3   RADIAL ARTERY  HARVEST Left 03/01/2016   Procedure: LEFT RADIAL ARTERY HARVEST;  Surgeon: Grace Isaac, MD;  Location: [place];  Service: Open Heart Surgery;  Laterality: Left;   SPLIT NIGHT STUDY  09/21/2015   TEE WITHOUT CARDIOVERSION N/A 03/01/2016   Procedure: TRANSESOPHAGEAL ECHOCARDIOGRAM (TEE);  Surgeon: Grace Isaac, MD;  Location: [place];  Service: Open Heart Surgery;  Laterality: N/A;   TONSILLECTOMY  AS CHILD   Social History   Socioeconomic History   Marital status: Married    Spouse name: Bethena Roys   Number of children: Not on file   Years of education: Not on file   Highest education level: Not on file  Occupational History   Occupation: retired  Tobacco Use    Smoking status: Former    Packs/day: 0.50    Years: 10.00    Total pack years: 5.00    Types: Cigars, Cigarettes    Quit date: 05/30/2002    Years since quitting: 20.0   Smokeless tobacco: Never  Vaping Use   Vaping Use: Never used  Substance and Sexual Activity   Alcohol use: Not Currently    Comment: occ BEER   Drug use: No   Sexual activity: Not Currently  Other Topics Concern   Not on file  Social History Narrative   VA benefits - goes to [place] once or twice a year at least - has most vaccines and screenings there   Social Determinants of Health   Financial Resource Strain: Low Risk  (06/01/2021)   Overall Financial Resource Strain (CARDIA)    Difficulty of Paying Living Expenses: Not hard at all  Food Insecurity: No Food Insecurity (06/01/2021)   Hunger Vital Sign    Worried About Running Out of Food in the Last Year: Never true    [place] in the Last Year: Never true  Transportation Needs: No Transportation Needs (06/01/2021)   PRAPARE - Hydrologist (Medical): No    Lack of Transportation (Non-Medical): No  Physical Activity: Insufficiently Active (06/01/2021)   Exercise Vital Sign    Days of Exercise per Week: 5 days    Minutes of Exercise per Session: 20 min  Stress: No Stress Concern Present (06/01/2021)   [place]    Feeling of Stress : Only a little  Social Connections: Socially Integrated (06/01/2021)   Social Connection and Isolation Panel [NHANES]    Frequency of Communication with Friends and Family: More than three times a week    Frequency of Social Gatherings with Friends and Family: More than three times a week    Attends Religious Services: More than 4 times per year    Active Member of [employer] or Organizations: Yes    Attends Archivist Meetings: 1 to 4 times per year    Marital Status: Married  Human resources officer Violence: Not At Risk (06/01/2021)    Humiliation, Afraid, Rape, and Kick questionnaire    Fear of Current or Ex-Partner: No    Emotionally Abused: No    Physically Abused: No    Sexually Abused: No   Family History  Problem Relation Age of Onset   Heart disease Mother    Stroke Father    Healthy Brother    Colon cancer Neg Hx    Esophageal cancer Neg Hx    Rectal cancer Neg Hx    Stomach cancer Neg Hx    I have reviewed his medical, social,  and family history in detail and updated the electronic medical record as necessary.    PHYSICAL EXAMINATION  BP 124/72   Pulse 86   Ht '5\' 11"'$  (1.803 m)   Wt 253 lb (114.8 kg)   BMI 35.29 kg/m  Wt Readings from Last 3 Encounters:  06/03/22 253 lb (114.8 kg)  04/04/22 248 lb (112.5 kg)  09/22/21 251 lb 6.4 oz (114 kg)   GEN: NAD, appears stated age, doesn't appear chronically ill PSYCH: Cooperative, without pressured speech EYE: Conjunctivae pink, sclerae anicteric ENT: MMM, without oral ulcers, no erythema or exudates noted NECK: Supple CV: RR without R/Gs  RESP: CTAB posteriorly, without wheezing GI: NABS, soft, NT/ND, without rebound or guarding, no HSM appreciated GU: DRE shows MSK/EXT: _ edema, no palmar erythema SKIN: No jaundice, no spider angiomata, no concerning rashes NEURO:  Alert & Oriented x 3, no focal deficits, no evidence of asterixis   REVIEW OF DATA  I reviewed the following data at the time of this encounter:  GI Procedures and Studies  ***  Laboratory Studies  ***  Imaging Studies  ***   ASSESSMENT  Mr. Tallon is a 72 y.o. male with a pmh significant for The patient is seen today for evaluation and management of:  No diagnosis found.  ***   PLAN  There are no diagnoses linked to this encounter.   No orders of the defined types were placed in this encounter.   New Prescriptions   No medications on file   Modified Medications   No medications on file    Planned Follow Up No follow-ups on file.   Total Time in  Face-to-Face and in Coordination of Care for patient including independent/personal interpretation/review of prior testing, medical history, examination, medication adjustment, communicating results with the patient directly, and documentation within the EHR is ***.   Justice Britain, MD [facility] Office # [phone]

## 2022-06-04 ENCOUNTER — Encounter: Payer: Self-pay | Admitting: Gastroenterology

## 2022-06-06 LAB — HEPATITIS A ANTIBODY, TOTAL: Hepatitis A AB,Total: REACTIVE — AB

## 2022-06-06 LAB — HEPATITIS B CORE ANTIBODY, TOTAL: Hep B Core Total Ab: NONREACTIVE

## 2022-06-06 LAB — HEPATITIS B SURFACE ANTIGEN: Hepatitis B Surface Ag: NONREACTIVE

## 2022-06-06 LAB — HEPATITIS C ANTIBODY: Hepatitis C Ab: NONREACTIVE

## 2022-06-06 LAB — HEPATITIS B SURFACE ANTIBODY,QUALITATIVE: Hep B S Ab: NONREACTIVE

## 2022-06-07 DIAGNOSIS — I8510 Secondary esophageal varices without bleeding: Secondary | ICD-10-CM | POA: Insufficient documentation

## 2022-06-07 DIAGNOSIS — K838 Other specified diseases of biliary tract: Secondary | ICD-10-CM | POA: Insufficient documentation

## 2022-06-07 DIAGNOSIS — K7460 Unspecified cirrhosis of liver: Secondary | ICD-10-CM | POA: Insufficient documentation

## 2022-07-01 ENCOUNTER — Telehealth: Payer: Self-pay | Admitting: Gastroenterology

## 2022-07-01 NOTE — Telephone Encounter (Signed)
Patient is calling states he wants to stop taking his Propranolol, states it not agreeing with him and would like to know how to go about stopping. Please advise

## 2022-07-02 NOTE — Telephone Encounter (Signed)
I am sorry to hear that he is not tolerating the side effect profile again of beta-blockers. He does have esophageal varices for which we were trying to titrate this to minimize his risk of bleeding while on antiplatelet therapy as well.  Since he cannot tolerate this then we need to evaluate the varices to see how they look at this point and then consider the role of endoscopic band ligation if they have enlarge significantly. If he is then able to titrate the propranolol to twice daily, then he will need to go to once daily for 1 week and then once every other day for 1 week and then stop so that we do not have a rebound effect. If he never got up to twice daily and has been only on once daily then go every other day for 1 week and then stop. Please set him up for a endoscopy in the [facility] for follow-up of cirrhosis and esophageal varices. Thanks. GM

## 2022-07-04 NOTE — Telephone Encounter (Signed)
I spoke with the pt and advised of the recommendations per GM.  He tells me that he does not want to have an EGD at this time so he will continue to take the propranolol twice daily and see if the fatigue gets better. He will call back in a few weeks to let us know how he would like to proceed.

## 2022-07-16 ENCOUNTER — Other Ambulatory Visit: Payer: Self-pay | Admitting: Cardiology

## 2022-08-04 ENCOUNTER — Other Ambulatory Visit: Payer: Self-pay | Admitting: *Deleted

## 2022-08-10 DIAGNOSIS — C44311 Basal cell carcinoma of skin of nose: Secondary | ICD-10-CM | POA: Diagnosis not present

## 2022-08-10 DIAGNOSIS — Z86006 Personal history of melanoma in-situ: Secondary | ICD-10-CM | POA: Diagnosis not present

## 2022-08-10 DIAGNOSIS — L821 Other seborrheic keratosis: Secondary | ICD-10-CM | POA: Diagnosis not present

## 2022-08-10 DIAGNOSIS — L905 Scar conditions and fibrosis of skin: Secondary | ICD-10-CM | POA: Diagnosis not present

## 2022-08-10 DIAGNOSIS — D485 Neoplasm of uncertain behavior of skin: Secondary | ICD-10-CM | POA: Diagnosis not present

## 2022-08-10 DIAGNOSIS — L853 Xerosis cutis: Secondary | ICD-10-CM | POA: Diagnosis not present

## 2022-08-28 ENCOUNTER — Other Ambulatory Visit: Payer: Self-pay | Admitting: Cardiology

## 2022-10-10 ENCOUNTER — Ambulatory Visit: Payer: Medicare HMO | Attending: Cardiology | Admitting: Cardiology

## 2022-10-10 ENCOUNTER — Encounter: Payer: Self-pay | Admitting: Cardiology

## 2022-10-10 VITALS — BP 120/60 | HR 70 | Ht 71.0 in | Wt 250.4 lb

## 2022-10-10 DIAGNOSIS — I25118 Atherosclerotic heart disease of native coronary artery with other forms of angina pectoris: Secondary | ICD-10-CM

## 2022-10-10 DIAGNOSIS — I10 Essential (primary) hypertension: Secondary | ICD-10-CM

## 2022-10-10 DIAGNOSIS — K7460 Unspecified cirrhosis of liver: Secondary | ICD-10-CM | POA: Diagnosis not present

## 2022-10-10 DIAGNOSIS — E782 Mixed hyperlipidemia: Secondary | ICD-10-CM

## 2022-10-10 DIAGNOSIS — E119 Type 2 diabetes mellitus without complications: Secondary | ICD-10-CM | POA: Diagnosis not present

## 2022-10-10 NOTE — Patient Instructions (Signed)
Medication Instructions:  Continue all current medications.   Labwork: none  Testing/Procedures: none  Follow-Up: 6 months   Any Other Special Instructions Will Be Listed Below (If Applicable).   If you need a refill on your cardiac medications before your next appointment, please call your pharmacy.  

## 2022-10-10 NOTE — Progress Notes (Signed)
Clinical Summary [name] is a 72 y.o.male seen today for follow up of the following medical problems.    1. CAD - multiple PCIs, CABG in 02/2016 08/2017 nuclear stress no ischemia, large prior infarct  - committed to indefinite DAPT due to long history of significant disease and interventions       Jan 2023 nuclear stress: anterior scar. Inferior infarct with peri-infarct ischemia. LVEF 40%. SDS 4.     -prior visit stopped imdur and started norvasc instead as antianginal, he wanted to be able to try viagra.  - did not start viagra, did stop imdur and start norvasc - no recent chest pains - compliant with meds - since last visit off ASA, now on eliquis and plavix. I have no records on indications for eliquis   2. ICM - Jan 2019 echo LVEF 45-50%, akinesis mid-apicalanteroseptal wall. Grade II diastolic dysfunction - 12/2020 echo LVEF 50-55%, difficult to discern WMAs   Jan 2023 echo LVEF 50-55% - GI changed metoprolol to nadolol due to varicies, later changed to propranolol due to side effects on nadolol - no SOB/DOE, mild swelling in legs at times.    3. Mild aortic stenosis Jan 2019 mild AS: mean grade 12, AVA VTI 1.81 - no recent symptoms.     12/2020 echo LVEF 50-55%, no significant AS mean grad 7 AVA TI 2.88       4. HTN -cough on ramipril. started on losartan - compliant withmeds     5. Hyperlipidemia Jan 2020 TC 131 TG 182 HDL 45 LDL 50 -  labs with [facility] and pcp   07/2019 TC 127 TG 472 LDL direct 39. - reports more recent labs at [facility]       6. DM2 -per pcp     7. Abnormal CT scan - prior CT scan from [place] suggested possible LV apical thrombus.  - with apical akinesis from prior echos certaintly has risk for thrombus - Jan 2023 echo no thrombus   8. Cirrhosis - followed by GI   SH: retired Visual merchandiser Past Medical History:  Diagnosis Date   Anginal pain (HCC)    occ   Aortic stenosis 06/16/2017   Mild, noted on ECHO    Arthritis    Atrial fibrillation (HCC) 02/2016   post op   CAD (coronary artery disease)    a. Anterior STEMI 2005 c/b vfib arrest, PCI to LAD at [facility]. b. Stent thrombosis 2006 with DES within prior LAD stent at [facility]. C. 09/2012: s/p balloon angioplasty to mLAD for severe stenosis in previously stented segment & DES to LCx; initial enz neg but ruled in for NSTEMI after post-cath vagal sx, felt d/t distal emboliz of thrombus during case.   Cancer (HCC)    skin ca melanoma, pt states was removed from back   Chronic back pain    Dependent edema    Mild, occ NONE RECENT   Diarrhea    DM type 2 (diabetes mellitus, type 2) (HCC)    diet controlled   Elevated hemidiaphragm    a. Noted 09/2012 - instructed to f/u PCP.   Heart murmur    History of kidney stones    Hyperlipidemia    Hypertension    Ischemic cardiomyopathy    a. Unclear prior EF but pt was told heart was weakened in past. b. EF normal 09/2012.   Myocardial infarction (HCC) 2005   TOTAL MI'S 4 LAST ONE 2017   Retinal tear,  right    Ventricular fibrillation (HCC)    a. VF arrest 2005 in setting of STEMI.   Wears glasses    Wears glasses    for reading     Allergies  Allergen Reactions   Altace [Ramipril] Shortness Of Breath and Other (See Comments)    cough   Penicillins Swelling    Has patient had a PCN reaction causing immediate rash, facial/tongue/throat swelling, SOB or lightheadedness with hypotension:unsure Has patient had a PCN reaction causing severe rash involving mucus membranes or skin necrosis:unsure Has patient had a PCN reaction that required hospitalization:No Has patient had a PCN reaction occurring within the last 10 years:NO If all of the above answers are "NO", then may proceed with Cephalosporin use.  Childhood reaction       Current Outpatient Medications  Medication Sig Dispense Refill   acetaminophen (TYLENOL) 500 MG tablet Take 500 mg by mouth every 6 (six) hours as needed (PAIN).       amLODipine (NORVASC) 10 MG tablet TAKE 1 TABLET EVERY DAY 90 tablet 1   apixaban (ELIQUIS) 5 MG TABS tablet Take 5 mg by mouth 2 (two) times daily.     cetirizine (ZYRTEC) 10 MG tablet Take 10 mg by mouth daily as needed.      Cholecalciferol (VITAMIN D) 50 MCG (2000 UT) tablet Take 2,000 Units by mouth daily.     clopidogrel (PLAVIX) 75 MG tablet TAKE 1 TABLET EVERY DAY 90 tablet 3   finasteride (PROSCAR) 5 MG tablet Take 2.5 mg by mouth daily.     losartan (COZAAR) 25 MG tablet TAKE 1 TABLET (25 MG TOTAL) BY MOUTH DAILY. 90 tablet 1   propranolol (INDERAL) 10 MG tablet Take 1 tablet (10 mg total) by mouth 2 (two) times daily. 60 tablet 0   rosuvastatin (CRESTOR) 20 MG tablet TAKE 1 TABLET EVERY DAY 90 tablet 3   TURMERIC PO Take 2 capsules by mouth daily.     UNABLE TO FIND Med Name: IMMODIUM PRN     Current Facility-Administered Medications  Medication Dose Route Frequency Provider Last Rate Last Admin   0.9 %  sodium chloride infusion  500 mL Intravenous Continuous Rachael Fee, MD         Past Surgical History:  Procedure Laterality Date   BACK SURGERY  1997   UPPER CERVICAL C 3 TO C 4 FUSION   CARDIAC CATHETERIZATION     stents x2   CARDIAC CATHETERIZATION N/A 10/02/2014   Procedure: Left Heart Cath And Coronary Angiography;  Surgeon: Laurey Morale, MD;  Location: [facility] INVASIVE CV LAB CUP[facility];  Service: Cardiovascular;  Laterality: N/A;   CARDIAC CATHETERIZATION N/A 02/17/2016   Procedure: Left Heart Cath and Coronary Angiography;  Surgeon: Laurey Morale, MD;  Location: [facility] INVASIVE CV LAB;  Service: Cardiovascular;  Laterality: N/A;   CORONARY ARTERY BYPASS GRAFT N/A 03/01/2016   Procedure: CORONARY ARTERY BYPASS GRAFTING (CABG)x3 with endoscopic harvesting of right saphenous vein -LIMA to LAD -SVG to LEFT CIRCUMFLEX -RADIAL ARTERY to RAMUS INTERMEDIA;  Surgeon: Delight Ovens, MD;  Location: [facility] OR;  Service: Open Heart Surgery;  Laterality: N/A;   CORONARY STENT PLACEMENT   2005 AND REAPLCED 2006, 2014   CYSTOSCOPY/RETROGRADE/URETEROSCOPY/STONE EXTRACTION WITH BASKET Bilateral 08/07/2020   Procedure: CYSTOSCOPY/RETROGRADE/URETEROSCOPY, HOLMIUM LASER LITHOTRIPSY /STONE EXTRACTION WITH BASKET/ LEFT STENT PLACEMENT;  Surgeon: Jerilee Field, MD;  Location: [facility];  Service: Urology;  Laterality: Bilateral;   CYSTOSCOPY/URETEROSCOPY/HOLMIUM LASER/STENT PLACEMENT Bilateral 05/29/2018  Procedure: CYSTOSCOPY/RETROGRADE/URETEROSCOPY/HOLMIUM LASER/STENT PLACEMENT/ BASKET STONE EXTRACTION;  Surgeon: Jerilee Field, MD;  Location: [facility];  Service: Urology;  Laterality: Bilateral;   EYE EXAMINATION UNDER ANESTHESIA W/ RETINAL CRYOTHERAPY AND RETINAL LASER Right 2018   HAND SURGERY Left 1990'S   hamick bone rem   HOLMIUM LASER APPLICATION Bilateral 08/07/2020   Procedure: HOLMIUM LASER APPLICATION;  Surgeon: Jerilee Field, MD;  Location: [facility];  Service: Urology;  Laterality: Bilateral;   KNEE ARTHROSCOPY Right 10/24/2014   Procedure: ARTHROSCOPY RIGHT KNEE, Partial medial menisectomy and chondroplasty;  Surgeon: Marcene Corning, MD;  Location: [facility] OR;  Service: Orthopedics;  Laterality: Right;   LEFT HEART CATH N/A 10/11/2012   Procedure: LEFT HEART CATH;  Surgeon: Tonny Bollman, MD;  Location: [facility] CATH LAB;  Service: Cardiovascular;  Laterality: N/A;   LITHOTRIPSY  EARLY 2000'S   X 2 OR 3   RADIAL ARTERY HARVEST Left 03/01/2016   Procedure: LEFT RADIAL ARTERY HARVEST;  Surgeon: Delight Ovens, MD;  Location: [facility] OR;  Service: Open Heart Surgery;  Laterality: Left;   SPLIT NIGHT STUDY  09/21/2015   TEE WITHOUT CARDIOVERSION N/A 03/01/2016   Procedure: TRANSESOPHAGEAL ECHOCARDIOGRAM (TEE);  Surgeon: Delight Ovens, MD;  Location: [facility] OR;  Service: Open Heart Surgery;  Laterality: N/A;   TONSILLECTOMY  AS CHILD     Allergies  Allergen Reactions   Altace [Ramipril] Shortness Of Breath and Other (See  Comments)    cough   Penicillins Swelling    Has patient had a PCN reaction causing immediate rash, facial/tongue/throat swelling, SOB or lightheadedness with hypotension:unsure Has patient had a PCN reaction causing severe rash involving mucus membranes or skin necrosis:unsure Has patient had a PCN reaction that required hospitalization:No Has patient had a PCN reaction occurring within the last 10 years:NO If all of the above answers are "NO", then may proceed with Cephalosporin use.  Childhood reaction        Family History  Problem Relation Age of Onset   Heart disease Mother    Stroke Father    Healthy Brother    Colon cancer Neg Hx    Esophageal cancer Neg Hx    Rectal cancer Neg Hx    Stomach cancer Neg Hx    Inflammatory bowel disease Neg Hx    Liver disease Neg Hx    Pancreatic cancer Neg Hx      Social History Mr. Fabry reports that he quit smoking about 20 years ago. His smoking use included cigars and cigarettes. He has a 5.00 pack-year smoking history. He has never used smokeless tobacco. Mr. Depaulis reports that he does not currently use alcohol.   Review of Systems CONSTITUTIONAL: No weight loss, fever, chills, weakness or fatigue.  HEENT: Eyes: No visual loss, blurred vision, double vision or yellow sclerae.No hearing loss, sneezing, congestion, runny nose or sore throat.  SKIN: No rash or itching.  CARDIOVASCULAR: per hpi RESPIRATORY: No shortness of breath, cough or sputum.  GASTROINTESTINAL: No anorexia, nausea, vomiting or diarrhea. No abdominal pain or blood.  GENITOURINARY: No burning on urination, no polyuria NEUROLOGICAL: No headache, dizziness, syncope, paralysis, ataxia, numbness or tingling in the extremities. No change in bowel or bladder control.  MUSCULOSKELETAL: No muscle, back pain, joint pain or stiffness.  LYMPHATICS: No enlarged nodes. No history of splenectomy.  PSYCHIATRIC: No history of depression or anxiety.  ENDOCRINOLOGIC: No  reports of sweating, cold or heat intolerance. No polyuria or polydipsia.  Marland Kitchen   Physical Examination Today's  Vitals   10/10/22 0843  BP: 120/60  Pulse: 70  SpO2: 96%  Weight: 250 lb 6.4 oz (113.6 kg)  Height: 5\' 11"  (1.803 m)   Body mass index is 34.92 kg/m.  Gen: resting comfortably, no acute distress HEENT: no scleral icterus, pupils equal round and reactive, no palptable cervical adenopathy,  CV: RRR, 2/6 systolic murmur rusb, no jvd Resp: Clear to auscultation bilaterally GI: abdomen is soft, non-tender, non-distended, normal bowel sounds, no hepatosplenomegaly MSK: extremities are warm, no edema.  Skin: warm, no rash Neuro:  no focal deficits Psych: appropriate affect   Diagnostic Studies  08/2017 nuclear stress Nuclear stress EF: 48%. Apical and anteroseptal wall hypokinesis There was no ST segment deviation noted during stress. Defect 1: There is a large defect of severe severity present in the mid anteroseptal, mid inferoseptal, mid inferior, mid inferolateral, apical anterior, apical septal, apical inferior, apical lateral and apex location. This is an intermediate risk study. Appears to be large prior infarct pattern as described above. No ischemia identified.   Jan 2019 echo Study Conclusions   - Left ventricle: The cavity size was normal. Wall thickness was   increased in a pattern of mild LVH. There was mild focal basal   hypertrophy of the septum. Systolic function was mildly reduced.   The estimated ejection fraction was in the range of 45% to 50%.   There is akinesis of the mid-apicalanteroseptal myocardium.   Features are consistent with a pseudonormal left ventricular   filling pattern, with concomitant abnormal relaxation and   increased filling pressure (grade 2 diastolic dysfunction). - Aortic valve: Valve mobility was restricted. There was mild   stenosis. There was trivial regurgitation. - Ascending aorta: The ascending aorta was mildly dilated. -  Pulmonary arteries: Systolic pressure was mildly increased. PA   peak pressure: 36 mm Hg (S).   Impressions:   - Akinesis of the distal septum/apex; overall mildly reduced LV   systolic function; moderate diastolic dysfunction; calcified   aortic valve with fixed noncoronary cusp; mild AS (mean gradient   12 mmHg) and trace AI; milldy dilated ascending aorta; mild TR   with mildly elevated pulmonary pressure   12/2020 echo 1. Endocardium poorly visualized, apex appears to be hypokinetic. Marland Kitchen Left  ventricular ejection fraction, by estimation, is 50 to 55%. The left  ventricle has low normal function. Left ventricular endocardial border not  optimally defined to evaluate  regional wall motion. There is severe left ventricular hypertrophy. Left  ventricular diastolic parameters are indeterminate.   2. Right ventricular systolic function is normal. The right ventricular  size is normal.   3. Left atrial size was moderately dilated.   4. The mitral valve is normal in structure. No evidence of mitral valve  regurgitation. No evidence of mitral stenosis.   5. The aortic valve is tricuspid. Aortic valve regurgitation is mild. No  aortic stenosis is present.   6. The inferior vena cava is normal in size with greater than 50%  respiratory variability, suggesting right atrial pressure of 3 mmHg.   Jan 2023 echo IMPRESSIONS     1. Left ventricular ejection fraction, by estimation, is 50 to 55%. The  left ventricle has low normal function. The left ventricle demonstrates  regional wall motion abnormalities (see scoring diagram/findings for  description). There is moderate  asymmetric left ventricular hypertrophy of the basal-septal segment.   2. No LV thrombus seen   3. Right ventricular systolic function is normal. The right ventricular  size  is normal.   4. The mitral valve is normal in structure. No evidence of mitral valve  regurgitation. No evidence of mitral stenosis.   5. The aortic  valve was not well visualized. Aortic valve regurgitation  is trivial. No aortic stenosis is present.          Assessment and Plan  1. CAD with stable angina/ ICM - had been committed to indefinite DAPT due to long history of significant disease and interventions -since our last visit he has been started on eliquis by another provider, requesting records from [place]. At this time I am not clear on the indication. Remains on plavix given extensive CAD/interventions.  - no symptoms, continue current meds  2. HTN  At goal, continue current meds   3. Hyperlipidemia -request labs from pcp, continue rosuvastatin   F/u 6 months      Antoine Poche, M.D

## 2022-10-11 ENCOUNTER — Encounter: Payer: Self-pay | Admitting: *Deleted

## 2022-10-19 ENCOUNTER — Other Ambulatory Visit: Payer: Self-pay | Admitting: Cardiology

## 2022-11-29 ENCOUNTER — Telehealth: Payer: Self-pay

## 2022-11-29 NOTE — Telephone Encounter (Signed)
-----   Message from Loretha Stapler, RN sent at 05/31/2022 10:52 AM EST ----- 4-month HCC screening recall

## 2022-11-29 NOTE — Telephone Encounter (Signed)
Left message on machine to call back  

## 2022-11-30 NOTE — Telephone Encounter (Signed)
Left message on machine to call back  

## 2022-11-30 NOTE — Telephone Encounter (Signed)
I have been unable to reach the pt by phone letter mailed 

## 2022-12-02 ENCOUNTER — Other Ambulatory Visit: Payer: Self-pay

## 2022-12-02 DIAGNOSIS — K7460 Unspecified cirrhosis of liver: Secondary | ICD-10-CM

## 2022-12-02 NOTE — Telephone Encounter (Signed)
Patient is returning your call.  

## 2022-12-02 NOTE — Telephone Encounter (Signed)
Called the patient back and advised he will be contacted by the radiology scheduler. He is due the liver u/s. Questions invited. No questions at this time.

## 2022-12-12 NOTE — Telephone Encounter (Signed)
The pt states he was called by the schedulers to set up the [place] but wants to wait a few weeks. He has knee issues at this time and will call when he is able.

## 2022-12-12 NOTE — Telephone Encounter (Signed)
Inbound call from patient , want to speak with a nurse in regards CT he needs to have done .Please advise

## 2023-01-10 DIAGNOSIS — R0981 Nasal congestion: Secondary | ICD-10-CM | POA: Diagnosis not present

## 2023-01-10 DIAGNOSIS — R030 Elevated blood-pressure reading, without diagnosis of hypertension: Secondary | ICD-10-CM | POA: Diagnosis not present

## 2023-01-10 DIAGNOSIS — R0602 Shortness of breath: Secondary | ICD-10-CM | POA: Diagnosis not present

## 2023-01-10 DIAGNOSIS — E669 Obesity, unspecified: Secondary | ICD-10-CM | POA: Diagnosis not present

## 2023-01-10 DIAGNOSIS — I509 Heart failure, unspecified: Secondary | ICD-10-CM | POA: Diagnosis not present

## 2023-01-10 DIAGNOSIS — Z6834 Body mass index (BMI) 34.0-34.9, adult: Secondary | ICD-10-CM | POA: Diagnosis not present

## 2023-01-11 DIAGNOSIS — Z85820 Personal history of malignant melanoma of skin: Secondary | ICD-10-CM | POA: Diagnosis not present

## 2023-01-11 DIAGNOSIS — L570 Actinic keratosis: Secondary | ICD-10-CM | POA: Diagnosis not present

## 2023-01-11 DIAGNOSIS — Z85828 Personal history of other malignant neoplasm of skin: Secondary | ICD-10-CM | POA: Diagnosis not present

## 2023-01-22 ENCOUNTER — Other Ambulatory Visit: Payer: Self-pay | Admitting: Cardiology

## 2023-02-04 ENCOUNTER — Other Ambulatory Visit: Payer: Self-pay | Admitting: Cardiology

## 2023-03-15 ENCOUNTER — Other Ambulatory Visit: Payer: Self-pay | Admitting: Cardiology

## 2023-04-19 ENCOUNTER — Ambulatory Visit: Payer: No Typology Code available for payment source | Admitting: Cardiology

## 2023-05-06 ENCOUNTER — Other Ambulatory Visit: Payer: Self-pay | Admitting: Cardiology

## 2023-05-09 DIAGNOSIS — Z85820 Personal history of malignant melanoma of skin: Secondary | ICD-10-CM | POA: Diagnosis not present

## 2023-05-09 DIAGNOSIS — Z85828 Personal history of other malignant neoplasm of skin: Secondary | ICD-10-CM | POA: Diagnosis not present

## 2023-05-09 DIAGNOSIS — L570 Actinic keratosis: Secondary | ICD-10-CM | POA: Diagnosis not present

## 2023-05-09 DIAGNOSIS — L821 Other seborrheic keratosis: Secondary | ICD-10-CM | POA: Diagnosis not present

## 2023-06-08 ENCOUNTER — Encounter: Payer: Self-pay | Admitting: Physical Therapy

## 2023-06-08 ENCOUNTER — Other Ambulatory Visit: Payer: Self-pay

## 2023-06-08 ENCOUNTER — Ambulatory Visit: Payer: No Typology Code available for payment source | Attending: Orthopedic Surgery | Admitting: Physical Therapy

## 2023-06-08 DIAGNOSIS — G8929 Other chronic pain: Secondary | ICD-10-CM | POA: Insufficient documentation

## 2023-06-08 DIAGNOSIS — M25562 Pain in left knee: Secondary | ICD-10-CM | POA: Diagnosis present

## 2023-06-08 DIAGNOSIS — M25662 Stiffness of left knee, not elsewhere classified: Secondary | ICD-10-CM | POA: Insufficient documentation

## 2023-06-08 DIAGNOSIS — R600 Localized edema: Secondary | ICD-10-CM | POA: Insufficient documentation

## 2023-06-08 NOTE — Therapy (Signed)
 OUTPATIENT PHYSICAL THERAPY LOWER EXTREMITY EVALUATION   Patient Name: Hayden Rivera MRN: [id number] DOB:[birth year], 73 y.o., male Today's Date: 06/08/2023  END OF SESSION:  PT End of Session - 06/08/23 0948     Visit Number 1    Number of Visits 12    Date for PT Re-Evaluation 07/20/23    PT Start Time 0924    PT Stop Time 1007    PT Time Calculation (min) 43 min    Activity Tolerance Patient tolerated treatment well    Behavior During Therapy [facility] for tasks assessed/performed             Past Medical History:  Diagnosis Date   Anginal pain (HCC)    occ   Aortic stenosis 06/16/2017   Mild, noted on ECHO   Arthritis    Atrial fibrillation (HCC) 02/2016   post op   CAD (coronary artery disease)    a. Anterior STEMI 2005 c/b vfib arrest, PCI to LAD at [facility]. b. Stent thrombosis 2006 with DES within prior LAD stent at [facility]. C. 09/2012: s/p balloon angioplasty to mLAD for severe stenosis in previously stented segment & DES to LCx; initial enz neg but ruled in for NSTEMI after post-cath vagal sx, felt d/t distal emboliz of thrombus during case.   Cancer (HCC)    skin ca melanoma, pt states was removed from back   Chronic back pain    Dependent edema    Mild, occ NONE RECENT   Diarrhea    DM type 2 (diabetes mellitus, type 2) (HCC)    diet controlled   Elevated hemidiaphragm    a. Noted 09/2012 - instructed to f/u PCP.   Heart murmur    History of kidney stones    Hyperlipidemia    Hypertension    Ischemic cardiomyopathy    a. Unclear prior EF but pt was told heart was weakened in past. b. EF normal 09/2012.   Myocardial infarction (HCC) 2005   TOTAL MI'S 4 LAST ONE 2017   Retinal tear, right    Ventricular fibrillation (HCC)    a. VF arrest 2005 in setting of STEMI.   Wears glasses    Wears glasses    for reading   Past Surgical History:  Procedure Laterality Date   BACK SURGERY  1997   UPPER CERVICAL C 3 TO C 4 FUSION   CARDIAC CATHETERIZATION     stents x2    CARDIAC CATHETERIZATION N/A 10/02/2014   Procedure: Left Heart Cath And Coronary Angiography;  Surgeon: Ezra GORMAN Shuck, MD;  Location: [facility] INVASIVE CV LAB CUP[facility];  Service: Cardiovascular;  Laterality: N/A;   CARDIAC CATHETERIZATION N/A 02/17/2016   Procedure: Left Heart Cath and Coronary Angiography;  Surgeon: Ezra GORMAN Shuck, MD;  Location: [facility] INVASIVE CV LAB;  Service: Cardiovascular;  Laterality: N/A;   CORONARY ARTERY BYPASS GRAFT N/A 03/01/2016   Procedure: CORONARY ARTERY BYPASS GRAFTING (CABG)x3 with endoscopic harvesting of right saphenous vein -LIMA to LAD -SVG to LEFT CIRCUMFLEX -RADIAL ARTERY to RAMUS INTERMEDIA;  Surgeon: Dallas KATHEE Jude, MD;  Location: [facility] OR;  Service: Open Heart Surgery;  Laterality: N/A;   CORONARY STENT PLACEMENT  2005 AND REAPLCED 2006, 2014   CYSTOSCOPY/RETROGRADE/URETEROSCOPY/STONE EXTRACTION WITH BASKET Bilateral 08/07/2020   Procedure: CYSTOSCOPY/RETROGRADE/URETEROSCOPY, HOLMIUM LASER LITHOTRIPSY /STONE EXTRACTION WITH BASKET/ LEFT STENT PLACEMENT;  Surgeon: Nieves Cough, MD;  Location: [facility];  Service: Urology;  Laterality: Bilateral;   CYSTOSCOPY/URETEROSCOPY/HOLMIUM LASER/STENT PLACEMENT Bilateral 05/29/2018   Procedure: CYSTOSCOPY/RETROGRADE/URETEROSCOPY/HOLMIUM LASER/STENT  PLACEMENT/ BASKET STONE EXTRACTION;  Surgeon: Nieves Cough, MD;  Location: [facility];  Service: Urology;  Laterality: Bilateral;   EYE EXAMINATION UNDER ANESTHESIA W/ RETINAL CRYOTHERAPY AND RETINAL LASER Right 2018   HAND SURGERY Left 1990'S   hamick bone rem   HOLMIUM LASER APPLICATION Bilateral 08/07/2020   Procedure: HOLMIUM LASER APPLICATION;  Surgeon: Nieves Cough, MD;  Location: [facility];  Service: Urology;  Laterality: Bilateral;   KNEE ARTHROSCOPY Right 10/24/2014   Procedure: ARTHROSCOPY RIGHT KNEE, Partial medial menisectomy and chondroplasty;  Surgeon: Maude Herald, MD;  Location: [facility] OR;  Service:  Orthopedics;  Laterality: Right;   LEFT HEART CATH N/A 10/11/2012   Procedure: LEFT HEART CATH;  Surgeon: Ozell Fell, MD;  Location: [facility] CATH LAB;  Service: Cardiovascular;  Laterality: N/A;   LITHOTRIPSY  EARLY 2000'S   X 2 OR 3   RADIAL ARTERY HARVEST Left 03/01/2016   Procedure: LEFT RADIAL ARTERY HARVEST;  Surgeon: Dallas KATHEE Jude, MD;  Location: [facility] OR;  Service: Open Heart Surgery;  Laterality: Left;   SPLIT NIGHT STUDY  09/21/2015   TEE WITHOUT CARDIOVERSION N/A 03/01/2016   Procedure: TRANSESOPHAGEAL ECHOCARDIOGRAM (TEE);  Surgeon: Dallas KATHEE Jude, MD;  Location: [facility] 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   Snoring 09/22/2015   Dyspnea on exertion 09/10/2014   Morbid obesity (HCC) 09/10/2014   Gastroesophageal reflux 03/03/2014   Elevated diaphragm 01/01/2013   Hyperlipidemia LDL goal <70 11/30/2012   Palpitations 11/30/2012   CAD S/P percutaneous coronary angioplasty/DES 10/12/2012   Sinus bradycardia 10/12/2012   Essential hypertension 10/12/2012   Bite, chigger 01/16/2007   Chromophytosis 01/16/2007   Blood glucose elevated 12/11/2006   Chest pain 12/08/2006   Adiposity 12/08/2006   Gain of weight 12/08/2006   Psychosexual dysfunction with inhibited sexual excitement 04/12/2006   Low back pain 07/14/2005   Carpal tunnel syndrome 07/07/2005   Anxiety disorder 06/22/2005   Osteoarthritis 01/21/2005   Healed  myocardial infarct 06/24/2003   Coronary atherosclerosis 06/23/2003    REFERRING PROVIDER: Elgin Ada MD  REFERRING DIAG: Medial meniscus tear s/p meniscectomy and chondroplasty  THERAPY DIAG:  Chronic pain of left knee  Stiffness of left knee, not elsewhere classified  Localized edema  Rationale for Evaluation and Treatment: Rehabilitation  ONSET DATE: Last Summer.  SUBJECTIVE:   SUBJECTIVE STATEMENT: The patient presents to the clinic s/p left knee meniscectomy and chondroplasty performed on 06/01/23.  He is pleased with his progress thus far and reporting his pain is low.  He is using a FWW at this time.  His pain is rated at a 2/10 today.    PERTINENT HISTORY: See above.  Right knee surgery. PAIN:  Are you having pain? Yes: NPRS scale: 2/10. Pain location: Left knee. Pain description: Ache. Aggravating factors: Increased up time. Relieving factors: Rest.  PRECAUTIONS: Other: Pain-free(no pain increase therex).    WEIGHT BEARING RESTRICTIONS: No  FALLS:  Has patient fallen in  last 6 months? No  LIVING ENVIRONMENT: Lives with: lives with their spouse Lives in: House/apartment Stairs: Avoiding upstairs at this time.  Step down into den accomplished with walker support. Has following equipment at home: Walker - 2 wheeled  PLOF: Independent  PATIENT GOALS: Get around better with less left knee pain.  Golf.  +  OBJECTIVE:  Note: Objective measures were completed at Evaluation unless otherwise noted.  PATIENT SURVEYS:  FOTO 35.5  EDEMA:  Circumferential: 1 cm greater on left than right.  PALPATION: Scope sites appear to be healing well.  He c/o some lateral left knee tenderness.  LOWER EXTREMITY ROM:  In supine:  Full left knee extension and flexion to 100 degrees.  LOWER EXTREMITY MMT:  The patient is easily able to perform a left SLR against gravity without extensor lag and SAQ.   GAIT: Safe gait pattern with a FWW with a decrease in step and  stride length.                                                                                                                              TREATMENT DATE: 06/08/23:  LE elevation and vasopneumatic on low to patient's left knee.      PATIENT EDUCATION:  Education details: Discussed exercise progression that will be designed to not increase pain. Person educated: Patient Education method: Explanation Education comprehension: verbalized understanding  HOME EXERCISE PROGRAM:   ASSESSMENT:  CLINICAL IMPRESSION: The patient presents to OPPT s/p left knee scope performed on 06/01/23.  He is pleased with his progress thus far.  He is walking safely with a FWW.  He has full left knee extension with flexion to 100 degrees.  He is easily able to perform an antigravity left SLR and SAQ.  He has minimal edema.  His FOTO limitation is a 35.5. Patient will benefit from skilled physical therapy intervention to address pain and deficits.   OBJECTIVE IMPAIRMENTS: Abnormal gait, decreased activity tolerance, decreased ROM, increased edema, and pain.   ACTIVITY LIMITATIONS: carrying, lifting, bending, stairs, and locomotion level  PARTICIPATION LIMITATIONS: meal prep, cleaning, laundry, shopping, and community activity  PERSONAL FACTORS: Time since onset of injury/illness/exacerbation are also affecting patient's functional outcome.   REHAB POTENTIAL: Excellent  CLINICAL DECISION MAKING: Stable/uncomplicated  EVALUATION COMPLEXITY: Low   GOALS: LONG TERM GOALS: Target date: 07/20/23.  Ind with a HEP.  Goal status: INITIAL  2.  Active left knee flexion to 115 degrees+ so the patient can perform functional tasks and do so with pain not > 2-3/10.  Goal status: INITIAL  3.  Perform a reciprocating stair gait with one railing with pain not > 2-3/10.  Goal status: INITIAL  4.  Walk a community distance without assistive device and left knee pain not > 2-3/10.  Goal status: INITIAL  5.  Perform  ADL's with pain not > 3/10. Goal status: INITIAL  PLAN:  PT FREQUENCY: 2x/week  PT DURATION: 6 weeks  PLANNED INTERVENTIONS: 97110-Therapeutic exercises, 97530- Therapeutic activity, [id number]- Neuromuscular re-education, 97535- Self Care, [id number]- Manual therapy, 97014- Electrical stimulation (unattended), 97016- Vasopneumatic device, Patient/Family education, Cryotherapy, and Moist heat  PLAN FOR NEXT SESSION: Nustep, Pain-free left LE therex, Gait activities, modalities as needed.     Neilah Fulwider, PT 06/08/2023, 11:19 AM

## 2023-06-12 ENCOUNTER — Ambulatory Visit: Payer: No Typology Code available for payment source | Admitting: Physical Therapy

## 2023-06-12 DIAGNOSIS — G8929 Other chronic pain: Secondary | ICD-10-CM

## 2023-06-12 DIAGNOSIS — M25562 Pain in left knee: Secondary | ICD-10-CM | POA: Diagnosis not present

## 2023-06-12 DIAGNOSIS — M25662 Stiffness of left knee, not elsewhere classified: Secondary | ICD-10-CM

## 2023-06-12 DIAGNOSIS — R600 Localized edema: Secondary | ICD-10-CM

## 2023-06-12 NOTE — Therapy (Addendum)
 OUTPATIENT PHYSICAL THERAPY LOWER EXTREMITY TREATMENT Patient Name: Hayden Rivera MRN: [id number] DOB:Mar 08, 1951, 73 y.o., male Today's Date: 06/12/2023  END OF SESSION:  PT End of Session - 06/12/23 1141     Visit Number 2    Number of Visits 12    Date for PT Re-Evaluation 07/20/23    PT Start Time 1015    PT Stop Time 1107    PT Time Calculation (min) 52 min    Activity Tolerance Patient tolerated treatment well    Behavior During Therapy [facility] for tasks assessed/performed              Past Medical History:  Diagnosis Date   Anginal pain (HCC)    occ   Aortic stenosis 06/16/2017   Mild, noted on ECHO   Arthritis    Atrial fibrillation (HCC) 02/2016   post op   CAD (coronary artery disease)    a. Anterior STEMI 2005 c/b vfib arrest, PCI to LAD at [facility]. b. Stent thrombosis 2006 with DES within prior LAD stent at [facility]. C. 09/2012: s/p balloon angioplasty to mLAD for severe stenosis in previously stented segment & DES to LCx; initial enz neg but ruled in for NSTEMI after post-cath vagal sx, felt d/t distal emboliz of thrombus during case.   Cancer (HCC)    skin ca melanoma, pt states was removed from back   Chronic back pain    Dependent edema    Mild, occ NONE RECENT   Diarrhea    DM type 2 (diabetes mellitus, type 2) (HCC)    diet controlled   Elevated hemidiaphragm    a. Noted 09/2012 - instructed to f/u PCP.   Heart murmur    History of kidney stones    Hyperlipidemia    Hypertension    Ischemic cardiomyopathy    a. Unclear prior EF but pt was told heart was weakened in past. b. EF normal 09/2012.   Myocardial infarction (HCC) 2005   TOTAL MI'S 4 LAST ONE 2017   Retinal tear, right    Ventricular fibrillation (HCC)    a. VF arrest 2005 in setting of STEMI.   Wears glasses    Wears glasses    for reading   Past Surgical History:  Procedure Laterality Date   BACK SURGERY  1997   UPPER CERVICAL C 3 TO C 4 FUSION   CARDIAC CATHETERIZATION     stents x2    CARDIAC CATHETERIZATION N/A 10/02/2014   Procedure: Left Heart Cath And Coronary Angiography;  Surgeon: Ezra GORMAN Shuck, MD;  Location: [facility] INVASIVE CV LAB CUP[facility];  Service: Cardiovascular;  Laterality: N/A;   CARDIAC CATHETERIZATION N/A 02/17/2016   Procedure: Left Heart Cath and Coronary Angiography;  Surgeon: Ezra GORMAN Shuck, MD;  Location: [facility] INVASIVE CV LAB;  Service: Cardiovascular;  Laterality: N/A;   CORONARY ARTERY BYPASS GRAFT N/A 03/01/2016   Procedure: CORONARY ARTERY BYPASS GRAFTING (CABG)x3 with endoscopic harvesting of right saphenous vein -LIMA to LAD -SVG to LEFT CIRCUMFLEX -RADIAL ARTERY to RAMUS INTERMEDIA;  Surgeon: Dallas KATHEE Jude, MD;  Location: [facility] OR;  Service: Open Heart Surgery;  Laterality: N/A;   CORONARY STENT PLACEMENT  2005 AND REAPLCED 2006, 2014   CYSTOSCOPY/RETROGRADE/URETEROSCOPY/STONE EXTRACTION WITH BASKET Bilateral 08/07/2020   Procedure: CYSTOSCOPY/RETROGRADE/URETEROSCOPY, HOLMIUM LASER LITHOTRIPSY /STONE EXTRACTION WITH BASKET/ LEFT STENT PLACEMENT;  Surgeon: Nieves Cough, MD;  Location: [facility];  Service: Urology;  Laterality: Bilateral;   CYSTOSCOPY/URETEROSCOPY/HOLMIUM LASER/STENT PLACEMENT Bilateral 05/29/2018   Procedure: CYSTOSCOPY/RETROGRADE/URETEROSCOPY/HOLMIUM LASER/STENT PLACEMENT/  BASKET STONE EXTRACTION;  Surgeon: Nieves Cough, MD;  Location: [facility];  Service: Urology;  Laterality: Bilateral;   EYE EXAMINATION UNDER ANESTHESIA W/ RETINAL CRYOTHERAPY AND RETINAL LASER Right 2018   HAND SURGERY Left 1990'S   hamick bone rem   HOLMIUM LASER APPLICATION Bilateral 08/07/2020   Procedure: HOLMIUM LASER APPLICATION;  Surgeon: Nieves Cough, MD;  Location: [facility];  Service: Urology;  Laterality: Bilateral;   KNEE ARTHROSCOPY Right 10/24/2014   Procedure: ARTHROSCOPY RIGHT KNEE, Partial medial menisectomy and chondroplasty;  Surgeon: Maude Herald, MD;  Location: [facility] OR;  Service:  Orthopedics;  Laterality: Right;   LEFT HEART CATH N/A 10/11/2012   Procedure: LEFT HEART CATH;  Surgeon: Ozell Fell, MD;  Location: [facility] CATH LAB;  Service: Cardiovascular;  Laterality: N/A;   LITHOTRIPSY  EARLY 2000'S   X 2 OR 3   RADIAL ARTERY HARVEST Left 03/01/2016   Procedure: LEFT RADIAL ARTERY HARVEST;  Surgeon: Dallas KATHEE Jude, MD;  Location: [facility] OR;  Service: Open Heart Surgery;  Laterality: Left;   SPLIT NIGHT STUDY  09/21/2015   TEE WITHOUT CARDIOVERSION N/A 03/01/2016   Procedure: TRANSESOPHAGEAL ECHOCARDIOGRAM (TEE);  Surgeon: Dallas KATHEE Jude, MD;  Location: [facility] 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   Snoring 09/22/2015   Dyspnea on exertion 09/10/2014   Morbid obesity (HCC) 09/10/2014   Gastroesophageal reflux 03/03/2014   Elevated diaphragm 01/01/2013   Hyperlipidemia LDL goal <70 11/30/2012   Palpitations 11/30/2012   CAD S/P percutaneous coronary angioplasty/DES 10/12/2012   Sinus bradycardia 10/12/2012   Essential hypertension 10/12/2012   Bite, chigger 01/16/2007   Chromophytosis 01/16/2007   Blood glucose elevated 12/11/2006   Chest pain 12/08/2006   Adiposity 12/08/2006   Gain of weight 12/08/2006   Psychosexual dysfunction with inhibited sexual excitement 04/12/2006   Low back pain 07/14/2005   Carpal tunnel syndrome 07/07/2005   Anxiety disorder 06/22/2005   Osteoarthritis 01/21/2005   Healed  myocardial infarct 06/24/2003   Coronary atherosclerosis 06/23/2003    REFERRING PROVIDER: Elgin Ada MD  REFERRING DIAG: Medial meniscus tear s/p meniscectomy and chondroplasty  THERAPY DIAG:  Chronic pain of left knee  Stiffness of left knee, not elsewhere classified  Localized edema  Rationale for Evaluation and Treatment: Rehabilitation  ONSET DATE: Last Summer.  SUBJECTIVE:   SUBJECTIVE STATEMENT: No new complaints.  PERTINENT HISTORY: See above.  Right knee surgery. PAIN:  Are you having pain? Yes: NPRS scale: 2/10. Pain location: Left knee. Pain description: Ache. Aggravating factors: Increased up time. Relieving factors: Rest.  PRECAUTIONS: Other: Pain-free(no pain increase therex).    WEIGHT BEARING RESTRICTIONS: No  FALLS:  Has patient fallen in last 6 months? No  LIVING ENVIRONMENT: Lives with: lives with their spouse Lives in: House/apartment Stairs: Avoiding upstairs at this time.  Step down into den accomplished with walker support. Has following equipment at home: Vannie - 2 wheeled  PLOF: Independent  PATIENT GOALS: Get around  better with less left knee pain.  Golf.  +  OBJECTIVE:  Note: Objective measures were completed at Evaluation unless otherwise noted.  PATIENT SURVEYS:  FOTO 35.5  EDEMA:  Circumferential: 1 cm greater on left than right.  PALPATION: Scope sites appear to be healing well.  He c/o some lateral left knee tenderness.  LOWER EXTREMITY ROM:  In supine:  Full left knee extension and flexion to 100 degrees.  LOWER EXTREMITY MMT:  The patient is easily able to perform a left SLR against gravity without extensor lag and SAQ.   GAIT: Safe gait pattern with a FWW with a decrease in step and stride length.                                                                                                                              TREATMENT DATE:   06/12/23:                                   EXERCISE  LOG  Exercise Repetitions and Resistance Comments  Nustep Level 3 x 15 minute   Rockerboard In parallel bars x 3 minutes   SAQ's 3# x 4 minutes           In supine:  Gentle Left knee flexion stretching (low load long duration stretching technique) x 5 minutes f/b LE elevation x 20 minutes to patient's left knee.    PATIENT EDUCATION:  Education details: Discussed exercise progression that will be designed to not increase pain. Person educated: Patient Education method: Explanation Education comprehension: verbalized understanding  HOME EXERCISE PROGRAM:   ASSESSMENT:  CLINICAL IMPRESSION: Patient did great with therapy performing therex with excellent technique and no pain increase.  OBJECTIVE IMPAIRMENTS: Abnormal gait, decreased activity tolerance, decreased ROM, increased edema, and pain.   ACTIVITY LIMITATIONS: carrying, lifting, bending, stairs, and locomotion level  PARTICIPATION LIMITATIONS: meal prep, cleaning, laundry, shopping, and community activity  PERSONAL FACTORS: Time since onset of injury/illness/exacerbation are also affecting patient's functional outcome.   REHAB POTENTIAL: Excellent  CLINICAL DECISION MAKING: Stable/uncomplicated  EVALUATION COMPLEXITY: Low   GOALS: LONG TERM GOALS: Target date: 07/20/23.  Ind with a HEP.  Goal status: INITIAL  2.  Active left knee flexion to 115 degrees+ so the patient can perform functional tasks and do so with pain not > 2-3/10.  Goal status: INITIAL  3.  Perform a reciprocating stair gait with one railing with pain not > 2-3/10.  Goal status: INITIAL  4.  Walk a community distance without assistive device and left knee pain not > 2-3/10.  Goal status: INITIAL  5.  Perform ADL's with pain not > 3/10. Goal status: INITIAL  PLAN:  PT FREQUENCY: 2x/week  PT DURATION: 6 weeks  PLANNED INTERVENTIONS: 97110-Therapeutic exercises, 97530- Therapeutic activity, [id number]- Neuromuscular re-education, 97535-  Self Care, 02859- Manual therapy, 97014- Electrical stimulation (unattended), 97016- Vasopneumatic device,  Patient/Family education, Cryotherapy, and Moist heat  PLAN FOR NEXT SESSION: Nustep, Pain-free left LE therex, Gait activities, modalities as needed.     Salbador Fiveash, PT 06/12/2023, 11:50 AM

## 2023-06-14 ENCOUNTER — Encounter: Payer: Self-pay | Admitting: Physical Therapy

## 2023-06-14 ENCOUNTER — Ambulatory Visit: Payer: No Typology Code available for payment source | Admitting: Physical Therapy

## 2023-06-14 DIAGNOSIS — M25562 Pain in left knee: Secondary | ICD-10-CM | POA: Diagnosis not present

## 2023-06-14 DIAGNOSIS — R600 Localized edema: Secondary | ICD-10-CM

## 2023-06-14 DIAGNOSIS — G8929 Other chronic pain: Secondary | ICD-10-CM

## 2023-06-14 DIAGNOSIS — M25662 Stiffness of left knee, not elsewhere classified: Secondary | ICD-10-CM

## 2023-06-14 NOTE — Therapy (Signed)
 OUTPATIENT PHYSICAL THERAPY LOWER EXTREMITY TREATMENT Patient Name: Hayden Rivera MRN: [id number] DOB:03/03/1951, 73 y.o., male Today's Date: 06/14/2023  END OF SESSION:  PT End of Session - 06/14/23 1214     Visit Number 3    Number of Visits 12    Date for PT Re-Evaluation 07/20/23    PT Start Time 1015    PT Stop Time 1105    PT Time Calculation (min) 50 min    Activity Tolerance Patient tolerated treatment well    Behavior During Therapy [facility] for tasks assessed/performed              Past Medical History:  Diagnosis Date   Anginal pain (HCC)    occ   Aortic stenosis 06/16/2017   Mild, noted on ECHO   Arthritis    Atrial fibrillation (HCC) 02/2016   post op   CAD (coronary artery disease)    a. Anterior STEMI 2005 c/b vfib arrest, PCI to LAD at [facility]. b. Stent thrombosis 2006 with DES within prior LAD stent at [facility]. C. 09/2012: s/p balloon angioplasty to mLAD for severe stenosis in previously stented segment & DES to LCx; initial enz neg but ruled in for NSTEMI after post-cath vagal sx, felt d/t distal emboliz of thrombus during case.   Cancer (HCC)    skin ca melanoma, pt states was removed from back   Chronic back pain    Dependent edema    Mild, occ NONE RECENT   Diarrhea    DM type 2 (diabetes mellitus, type 2) (HCC)    diet controlled   Elevated hemidiaphragm    a. Noted 09/2012 - instructed to f/u PCP.   Heart murmur    History of kidney stones    Hyperlipidemia    Hypertension    Ischemic cardiomyopathy    a. Unclear prior EF but pt was told heart was weakened in past. b. EF normal 09/2012.   Myocardial infarction (HCC) 2005   TOTAL MI'S 4 LAST ONE 2017   Retinal tear, right    Ventricular fibrillation (HCC)    a. VF arrest 2005 in setting of STEMI.   Wears glasses    Wears glasses    for reading   Past Surgical History:  Procedure Laterality Date   BACK SURGERY  1997   UPPER CERVICAL C 3 TO C 4 FUSION   CARDIAC CATHETERIZATION     stents x2    CARDIAC CATHETERIZATION N/A 10/02/2014   Procedure: Left Heart Cath And Coronary Angiography;  Surgeon: Darlis Eisenmenger, MD;  Location: [facility] INVASIVE CV LAB CUPID;  Service: Cardiovascular;  Laterality: N/A;   CARDIAC CATHETERIZATION N/A 02/17/2016   Procedure: Left Heart Cath and Coronary Angiography;  Surgeon: Darlis Eisenmenger, MD;  Location: [facility] INVASIVE CV LAB;  Service: Cardiovascular;  Laterality: N/A;   CORONARY ARTERY BYPASS GRAFT N/A 03/01/2016   Procedure: CORONARY ARTERY BYPASS GRAFTING (CABG)x3 with endoscopic harvesting of right saphenous vein -LIMA to LAD -SVG to LEFT CIRCUMFLEX -RADIAL ARTERY to RAMUS INTERMEDIA;  Surgeon: Norita Beauvais, MD;  Location: [facility] OR;  Service: Open Heart Surgery;  Laterality: N/A;   CORONARY STENT PLACEMENT  2005 AND REAPLCED 2006, 2014   CYSTOSCOPY/RETROGRADE/URETEROSCOPY/STONE EXTRACTION WITH BASKET Bilateral 08/07/2020   Procedure: CYSTOSCOPY/RETROGRADE/URETEROSCOPY, HOLMIUM LASER LITHOTRIPSY /STONE EXTRACTION WITH BASKET/ LEFT STENT PLACEMENT;  Surgeon: Christina Coyer, MD;  Location: [facility];  Service: Urology;  Laterality: Bilateral;   CYSTOSCOPY/URETEROSCOPY/HOLMIUM LASER/STENT PLACEMENT Bilateral 05/29/2018   Procedure: CYSTOSCOPY/RETROGRADE/URETEROSCOPY/HOLMIUM LASER/STENT PLACEMENT/  BASKET STONE EXTRACTION;  Surgeon: Christina Coyer, MD;  Location: [facility];  Service: Urology;  Laterality: Bilateral;   EYE EXAMINATION UNDER ANESTHESIA W/ RETINAL CRYOTHERAPY AND RETINAL LASER Right 2018   HAND SURGERY Left 1990'S   hamick bone rem   HOLMIUM LASER APPLICATION Bilateral 08/07/2020   Procedure: HOLMIUM LASER APPLICATION;  Surgeon: Christina Coyer, MD;  Location: [facility];  Service: Urology;  Laterality: Bilateral;   KNEE ARTHROSCOPY Right 10/24/2014   Procedure: ARTHROSCOPY RIGHT KNEE, Partial medial menisectomy and chondroplasty;  Surgeon: Dayne Even, MD;  Location: [facility] OR;  Service:  Orthopedics;  Laterality: Right;   LEFT HEART CATH N/A 10/11/2012   Procedure: LEFT HEART CATH;  Surgeon: Arnoldo Lapping, MD;  Location: [facility] CATH LAB;  Service: Cardiovascular;  Laterality: N/A;   LITHOTRIPSY  EARLY 2000'S   X 2 OR 3   RADIAL ARTERY HARVEST Left 03/01/2016   Procedure: LEFT RADIAL ARTERY HARVEST;  Surgeon: Norita Beauvais, MD;  Location: [facility] OR;  Service: Open Heart Surgery;  Laterality: Left;   SPLIT NIGHT STUDY  09/21/2015   TEE WITHOUT CARDIOVERSION N/A 03/01/2016   Procedure: TRANSESOPHAGEAL ECHOCARDIOGRAM (TEE);  Surgeon: Norita Beauvais, MD;  Location: [facility] 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   Snoring 09/22/2015   Dyspnea on exertion 09/10/2014   Morbid obesity (HCC) 09/10/2014   Gastroesophageal reflux 03/03/2014   Elevated diaphragm 01/01/2013   Hyperlipidemia LDL goal <70 11/30/2012   Palpitations 11/30/2012   CAD S/P percutaneous coronary angioplasty/DES 10/12/2012   Sinus bradycardia 10/12/2012   Essential hypertension 10/12/2012   Bite, chigger 01/16/2007   Chromophytosis 01/16/2007   Blood glucose elevated 12/11/2006   Chest pain 12/08/2006   Adiposity 12/08/2006   Gain of weight 12/08/2006   Psychosexual dysfunction with inhibited sexual excitement 04/12/2006   Low back pain 07/14/2005   Carpal tunnel syndrome 07/07/2005   Anxiety disorder 06/22/2005   Osteoarthritis 01/21/2005   Healed  myocardial infarct 06/24/2003   Coronary atherosclerosis 06/23/2003    REFERRING PROVIDER: Lisette Ridgel MD  REFERRING DIAG: Medial meniscus tear s/p meniscectomy and chondroplasty  THERAPY DIAG:  Chronic pain of left knee  Stiffness of left knee, not elsewhere classified  Localized edema  Rationale for Evaluation and Treatment: Rehabilitation  ONSET DATE: "Last Summer."  SUBJECTIVE:   SUBJECTIVE STATEMENT: No new complaints.  PERTINENT HISTORY: See above.  Right knee surgery. PAIN:  Are you having pain? Yes: NPRS scale: 2/10. Pain location: Left knee. Pain description: Ache. Aggravating factors: Increased up time. Relieving factors: Rest.  PRECAUTIONS: Other: Pain-free(no pain increase therex).    WEIGHT BEARING RESTRICTIONS: No  FALLS:  Has patient fallen in last 6 months? No  LIVING ENVIRONMENT: Lives with: lives with their spouse Lives in: House/apartment Stairs: Avoiding upstairs at this time.  Step down into den accomplished with walker support. Has following equipment at home: Otho Blitz - 2 wheeled  PLOF: Independent  PATIENT GOALS: Get around  better with less left knee pain.  Golf.  +  OBJECTIVE:  Note: Objective measures were completed at Evaluation unless otherwise noted.  PATIENT SURVEYS:  FOTO 35.5  EDEMA:  Circumferential: 1 cm greater on left than right.  PALPATION: Scope sites appear to be healing well.  He c/o some lateral left knee tenderness.  LOWER EXTREMITY ROM:  In supine:  Full left knee extension and flexion to 100 degrees.    LOWER EXTREMITY MMT:  The patient is easily able to perform a left SLR against gravity without extensor lag and SAQ.   GAIT: Safe gait pattern with a FWW with a decrease in step and stride length.                                                                                                                              TREATMENT DATE:   06/14/23:  Nustep level 3 x 16 minutes f/b rockerboard in  parallel bars x 3 minutes f/b LAQ's with 4# x 4 minutes f/b gentle flexion stretching to left knee x 5 minutes f/b LE elevation x 15 minutes to patient's left knee.                                     06/12/23:   EXERCISE LOG  Exercise Repetitions and Resistance Comments  Nustep Level 3 x 15 minute   Rockerboard In parallel bars x 3 minutes   SAQ's 3# x 4 minutes           In supine:  Gentle Left knee flexion stretching (low load long duration stretching technique) x 5 minutes f/b LE elevation x 20 minutes to patient's left knee.    PATIENT EDUCATION:  Education details: Discussed exercise progression that will be designed to not increase pain. Person educated: Patient Education method: Explanation Education comprehension: verbalized understanding  HOME EXERCISE PROGRAM:   ASSESSMENT:  CLINICAL IMPRESSION: Excellent progress toward goals with left knee flexion to 120 degrees today and no pain increase.  OBJECTIVE IMPAIRMENTS: Abnormal gait, decreased activity tolerance, decreased ROM, increased edema, and pain.   ACTIVITY LIMITATIONS: carrying, lifting, bending, stairs, and locomotion level  PARTICIPATION LIMITATIONS: meal prep, cleaning, laundry, shopping, and community activity  PERSONAL FACTORS: Time since onset of injury/illness/exacerbation are also affecting patient's functional outcome.   REHAB POTENTIAL: Excellent  CLINICAL DECISION MAKING: Stable/uncomplicated  EVALUATION COMPLEXITY: Low   GOALS: LONG TERM GOALS: Target date: 07/20/23.  Ind with a HEP.  Goal status: INITIAL  2.  Active left knee flexion to 115 degrees+ so the patient can perform functional tasks and do so with pain not > 2-3/10.  Goal status: INITIAL  3.  Perform a reciprocating stair gait with one railing with pain not > 2-3/10.  Goal status: INITIAL  4.  Walk a community distance without assistive device and left knee pain not > 2-3/10.  Goal  status: INITIAL  5.  Perform ADL's  with pain not > 3/10. Goal status: INITIAL  PLAN:  PT FREQUENCY: 2x/week  PT DURATION: 6 weeks  PLANNED INTERVENTIONS: 97110-Therapeutic exercises, 97530- Therapeutic activity, [id number]- Neuromuscular re-education, 97535- Self Care, 16109- Manual therapy, 97014- Electrical stimulation (unattended), 97016- Vasopneumatic device, Patient/Family education, Cryotherapy, and Moist heat  PLAN FOR NEXT SESSION: Nustep, Pain-free left LE therex, Gait activities, modalities as needed.     Javarie Crisp, [place], PT 06/14/2023, 12:20 PM

## 2023-06-19 ENCOUNTER — Ambulatory Visit: Payer: No Typology Code available for payment source

## 2023-06-19 DIAGNOSIS — G8929 Other chronic pain: Secondary | ICD-10-CM

## 2023-06-19 DIAGNOSIS — M25562 Pain in left knee: Secondary | ICD-10-CM | POA: Diagnosis not present

## 2023-06-19 DIAGNOSIS — R600 Localized edema: Secondary | ICD-10-CM

## 2023-06-19 DIAGNOSIS — M25662 Stiffness of left knee, not elsewhere classified: Secondary | ICD-10-CM

## 2023-06-19 NOTE — Therapy (Signed)
OUTPATIENT PHYSICAL THERAPY LOWER EXTREMITY TREATMENT Patient Name: Hayden Rivera MRN: [id number] DOB:1950-09-04, 72 y.o., male Today's Date: 06/19/2023  END OF SESSION:  PT End of Session - 06/19/23 1016     Visit Number 4    Number of Visits 12    Date for PT Re-Evaluation 07/20/23    PT Start Time 1015    PT Stop Time 1113    PT Time Calculation (min) 58 min    Activity Tolerance Patient tolerated treatment well    Behavior During Therapy [facility] for tasks assessed/performed              Past Medical History:  Diagnosis Date   Anginal pain (HCC)    occ   Aortic stenosis 06/16/2017   Mild, noted on ECHO   Arthritis    Atrial fibrillation (HCC) 02/2016   post op   CAD (coronary artery disease)    a. Anterior STEMI 2005 c/b vfib arrest, PCI to LAD at [facility]. b. Stent thrombosis 2006 with DES within prior LAD stent at [facility]. C. 09/2012: s/p balloon angioplasty to mLAD for severe stenosis in previously stented segment & DES to LCx; initial enz neg but ruled in for NSTEMI after post-cath vagal sx, felt d/t distal emboliz of thrombus during case.   Cancer (HCC)    skin ca melanoma, pt states was removed from back   Chronic back pain    Dependent edema    Mild, occ NONE RECENT   Diarrhea    DM type 2 (diabetes mellitus, type 2) (HCC)    diet controlled   Elevated hemidiaphragm    a. Noted 09/2012 - instructed to f/u PCP.   Heart murmur    History of kidney stones    Hyperlipidemia    Hypertension    Ischemic cardiomyopathy    a. Unclear prior EF but pt was told heart was weakened in past. b. EF normal 09/2012.   Myocardial infarction (HCC) 2005   TOTAL MI'S 4 LAST ONE 2017   Retinal tear, right    Ventricular fibrillation (HCC)    a. VF arrest 2005 in setting of STEMI.   Wears glasses    Wears glasses    for reading   Past Surgical History:  Procedure Laterality Date   BACK SURGERY  1997   UPPER CERVICAL C 3 TO C 4 FUSION   CARDIAC CATHETERIZATION     stents x2    CARDIAC CATHETERIZATION N/A 10/02/2014   Procedure: Left Heart Cath And Coronary Angiography;  Surgeon: Laurey Morale, MD;  Location: [facility] INVASIVE CV LAB CUPID;  Service: Cardiovascular;  Laterality: N/A;   CARDIAC CATHETERIZATION N/A 02/17/2016   Procedure: Left Heart Cath and Coronary Angiography;  Surgeon: Laurey Morale, MD;  Location: [facility] INVASIVE CV LAB;  Service: Cardiovascular;  Laterality: N/A;   CORONARY ARTERY BYPASS GRAFT N/A 03/01/2016   Procedure: CORONARY ARTERY BYPASS GRAFTING (CABG)x3 with endoscopic harvesting of right saphenous vein -LIMA to LAD -SVG to LEFT CIRCUMFLEX -RADIAL ARTERY to RAMUS INTERMEDIA;  Surgeon: Delight Ovens, MD;  Location: [facility] OR;  Service: Open Heart Surgery;  Laterality: N/A;   CORONARY STENT PLACEMENT  2005 AND REAPLCED 2006, 2014   CYSTOSCOPY/RETROGRADE/URETEROSCOPY/STONE EXTRACTION WITH BASKET Bilateral 08/07/2020   Procedure: CYSTOSCOPY/RETROGRADE/URETEROSCOPY, HOLMIUM LASER LITHOTRIPSY /STONE EXTRACTION WITH BASKET/ LEFT STENT PLACEMENT;  Surgeon: Jerilee Field, MD;  Location: [facility];  Service: Urology;  Laterality: Bilateral;   CYSTOSCOPY/URETEROSCOPY/HOLMIUM LASER/STENT PLACEMENT Bilateral 05/29/2018   Procedure: CYSTOSCOPY/RETROGRADE/URETEROSCOPY/HOLMIUM LASER/STENT PLACEMENT/  BASKET STONE EXTRACTION;  Surgeon: Jerilee Field, MD;  Location: [facility];  Service: Urology;  Laterality: Bilateral;   EYE EXAMINATION UNDER ANESTHESIA W/ RETINAL CRYOTHERAPY AND RETINAL LASER Right 2018   HAND SURGERY Left 1990'S   hamick bone rem   HOLMIUM LASER APPLICATION Bilateral 08/07/2020   Procedure: HOLMIUM LASER APPLICATION;  Surgeon: Jerilee Field, MD;  Location: [facility];  Service: Urology;  Laterality: Bilateral;   KNEE ARTHROSCOPY Right 10/24/2014   Procedure: ARTHROSCOPY RIGHT KNEE, Partial medial menisectomy and chondroplasty;  Surgeon: Marcene Corning, MD;  Location: [facility] OR;  Service:  Orthopedics;  Laterality: Right;   LEFT HEART CATH N/A 10/11/2012   Procedure: LEFT HEART CATH;  Surgeon: Tonny Bollman, MD;  Location: [facility] CATH LAB;  Service: Cardiovascular;  Laterality: N/A;   LITHOTRIPSY  EARLY 2000'S   X 2 OR 3   RADIAL ARTERY HARVEST Left 03/01/2016   Procedure: LEFT RADIAL ARTERY HARVEST;  Surgeon: Delight Ovens, MD;  Location: [facility] OR;  Service: Open Heart Surgery;  Laterality: Left;   SPLIT NIGHT STUDY  09/21/2015   TEE WITHOUT CARDIOVERSION N/A 03/01/2016   Procedure: TRANSESOPHAGEAL ECHOCARDIOGRAM (TEE);  Surgeon: Delight Ovens, MD;  Location: [facility] 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   Snoring 09/22/2015   Dyspnea on exertion 09/10/2014   Morbid obesity (HCC) 09/10/2014   Gastroesophageal reflux 03/03/2014   Elevated diaphragm 01/01/2013   Hyperlipidemia LDL goal <70 11/30/2012   Palpitations 11/30/2012   CAD S/P percutaneous coronary angioplasty/DES 10/12/2012   Sinus bradycardia 10/12/2012   Essential hypertension 10/12/2012   Bite, chigger 01/16/2007   Chromophytosis 01/16/2007   Blood glucose elevated 12/11/2006   Chest pain 12/08/2006   Adiposity 12/08/2006   Gain of weight 12/08/2006   Psychosexual dysfunction with inhibited sexual excitement 04/12/2006   Low back pain 07/14/2005   Carpal tunnel syndrome 07/07/2005   Anxiety disorder 06/22/2005   Osteoarthritis 01/21/2005   Healed  myocardial infarct 06/24/2003   Coronary atherosclerosis 06/23/2003    REFERRING PROVIDER: Augusto Gamble MD  REFERRING DIAG: Medial meniscus tear s/p meniscectomy and chondroplasty  THERAPY DIAG:  Chronic pain of left knee  Stiffness of left knee, not elsewhere classified  Localized edema  Rationale for Evaluation and Treatment: Rehabilitation  ONSET DATE: "Last Summer."  SUBJECTIVE:   SUBJECTIVE STATEMENT: Pt reports minimal soreness, but no really pain.  PERTINENT HISTORY: See above.  Right knee surgery. PAIN:  Are you having pain? Yes: NPRS scale: does not give a number/10. Pain location: Left knee. Pain description: Ache. Aggravating factors: Increased up time. Relieving factors: Rest.  PRECAUTIONS: Other: Pain-free(no pain increase therex).    WEIGHT BEARING RESTRICTIONS: No  FALLS:  Has patient fallen in last 6 months? No  LIVING ENVIRONMENT: Lives with: lives with their spouse Lives in: House/apartment Stairs: Avoiding upstairs at this time.  Step down into den accomplished with walker support. Has following equipment at home: Dan Humphreys - 2  wheeled  PLOF: Independent  PATIENT GOALS: Get around better with less left knee pain.  Golf.  OBJECTIVE:  Note: Objective measures were completed at Evaluation unless otherwise noted.  PATIENT SURVEYS:  FOTO 35.5  EDEMA:  Circumferential: 1 cm greater on left than right.  PALPATION: Scope sites appear to be healing well.  He c/o some lateral left knee tenderness.  LOWER EXTREMITY ROM:  In supine:  Full left knee extension and flexion to 100 degrees.    LOWER EXTREMITY MMT:  The patient is easily able to perform a left SLR against gravity without extensor lag and SAQ.   GAIT: Safe gait pattern with a FWW with a decrease in step and stride length.                                                                                                                              TREATMENT DATE:                          06/19/23            EXERCISE LOG  Exercise Repetitions and Resistance Comments  Nustep  Lvl 3 x 15 mins   Rockerboard 4 mins   Lunges 14" box x 3 mins   Forward Step Ups 6" box x 20 reps   LAQs 5# x 25 reps   Seated marches 5# x 25 reps    Blank cell = exercise not performed today   Modalities  Date:  Vaso: Knee, 34 degrees; low pressure, 15 mins, Pain and Edema    06/14/23:  Nustep level 3 x 16 minutes f/b rockerboard in parallel bars x 3 minutes f/b LAQ's with 4# x 4 minutes f/b gentle flexion stretching to left knee x 5 minutes f/b LE elevation x 15 minutes to patient's left knee.                               06/12/23:   EXERCISE LOG  Exercise Repetitions and Resistance Comments  Nustep Level 3 x 15 minute   Rockerboard In parallel bars x 3 minutes   SAQ's 3# x 4 minutes           In supine:  Gentle Left knee flexion stretching (low load long duration stretching technique) x 5 minutes f/b LE elevation x 20 minutes to patient's left knee.  PATIENT EDUCATION:  Education details: Discussed exercise progression that will be designed to not increase pain. Person educated: Patient Education method: Explanation Education comprehension: verbalized understanding  HOME EXERCISE PROGRAM:   ASSESSMENT:  CLINICAL IMPRESSION: Pt arrives for today's treatment session denying any pain, but does report minimal soreness.  Pt able to tolerate increased time and reps with various previously performed exercises.  Pt also able to tolerate increased weight with seated exercises.  Normal responses to vaso noted upon removal.  Pt  denied any pain at completion of today's treatment session.  OBJECTIVE IMPAIRMENTS: Abnormal gait, decreased activity tolerance, decreased ROM, increased edema, and pain.   ACTIVITY LIMITATIONS: carrying, lifting, bending, stairs, and locomotion level  PARTICIPATION LIMITATIONS: meal prep, cleaning, laundry, shopping, and community activity  PERSONAL FACTORS:  Time since onset of injury/illness/exacerbation are also affecting patient's functional outcome.   REHAB POTENTIAL: Excellent  CLINICAL DECISION MAKING: Stable/uncomplicated  EVALUATION COMPLEXITY: Low   GOALS: LONG TERM GOALS: Target date: 07/20/23.  Ind with a HEP.  Goal status: INITIAL  2.  Active left knee flexion to 115 degrees+ so the patient can perform functional tasks and do so with pain not > 2-3/10.  Goal status: INITIAL  3.  Perform a reciprocating stair gait with one railing with pain not > 2-3/10.  Goal status: INITIAL  4.  Walk a community distance without assistive device and left knee pain not > 2-3/10.  Goal status: INITIAL  5.  Perform ADL's with pain not > 3/10. Goal status: INITIAL  PLAN:  PT FREQUENCY: 2x/week  PT DURATION: 6 weeks  PLANNED INTERVENTIONS: 97110-Therapeutic exercises, 97530- Therapeutic activity, [id number]- Neuromuscular re-education, 97535- Self Care, [id number]- Manual therapy, 97014- Electrical stimulation (unattended), 97016- Vasopneumatic device, Patient/Family education, Cryotherapy, and Moist heat  PLAN FOR NEXT SESSION: Nustep, Pain-free left LE therex, Gait activities, modalities as needed.     Newman Pies, PTA 06/19/2023, 12:04 PM

## 2023-06-22 ENCOUNTER — Ambulatory Visit: Payer: No Typology Code available for payment source

## 2023-06-22 DIAGNOSIS — G8929 Other chronic pain: Secondary | ICD-10-CM

## 2023-06-22 DIAGNOSIS — R600 Localized edema: Secondary | ICD-10-CM

## 2023-06-22 DIAGNOSIS — M25562 Pain in left knee: Secondary | ICD-10-CM | POA: Diagnosis not present

## 2023-06-22 DIAGNOSIS — M25662 Stiffness of left knee, not elsewhere classified: Secondary | ICD-10-CM

## 2023-06-22 NOTE — Therapy (Signed)
OUTPATIENT PHYSICAL THERAPY LOWER EXTREMITY TREATMENT Patient Name: Hayden Rivera MRN: [id number] DOB:April 18, 1951, 73 y.o., male Today's Date: 06/22/2023  END OF SESSION:  PT End of Session - 06/22/23 1022     Visit Number 5    Number of Visits 12    Date for PT Re-Evaluation 07/20/23    PT Start Time 1015    PT Stop Time 1106    PT Time Calculation (min) 51 min    Activity Tolerance Patient tolerated treatment well    Behavior During Therapy [facility] for tasks assessed/performed              Past Medical History:  Diagnosis Date   Anginal pain (HCC)    occ   Aortic stenosis 06/16/2017   Mild, noted on ECHO   Arthritis    Atrial fibrillation (HCC) 02/2016   post op   CAD (coronary artery disease)    a. Anterior STEMI 2005 c/b vfib arrest, PCI to LAD at [facility]. b. Stent thrombosis 2006 with DES within prior LAD stent at [facility]. C. 09/2012: s/p balloon angioplasty to mLAD for severe stenosis in previously stented segment & DES to LCx; initial enz neg but ruled in for NSTEMI after post-cath vagal sx, felt d/t distal emboliz of thrombus during case.   Cancer (HCC)    skin ca melanoma, pt states was removed from back   Chronic back pain    Dependent edema    Mild, occ NONE RECENT   Diarrhea    DM type 2 (diabetes mellitus, type 2) (HCC)    diet controlled   Elevated hemidiaphragm    a. Noted 09/2012 - instructed to f/u PCP.   Heart murmur    History of kidney stones    Hyperlipidemia    Hypertension    Ischemic cardiomyopathy    a. Unclear prior EF but pt was told heart was weakened in past. b. EF normal 09/2012.   Myocardial infarction (HCC) 2005   TOTAL MI'S 4 LAST ONE 2017   Retinal tear, right    Ventricular fibrillation (HCC)    a. VF arrest 2005 in setting of STEMI.   Wears glasses    Wears glasses    for reading   Past Surgical History:  Procedure Laterality Date   BACK SURGERY  1997   UPPER CERVICAL C 3 TO C 4 FUSION   CARDIAC CATHETERIZATION     stents x2    CARDIAC CATHETERIZATION N/A 10/02/2014   Procedure: Left Heart Cath And Coronary Angiography;  Surgeon: Laurey Morale, MD;  Location: [facility] INVASIVE CV LAB CUPID;  Service: Cardiovascular;  Laterality: N/A;   CARDIAC CATHETERIZATION N/A 02/17/2016   Procedure: Left Heart Cath and Coronary Angiography;  Surgeon: Laurey Morale, MD;  Location: [facility] INVASIVE CV LAB;  Service: Cardiovascular;  Laterality: N/A;   CORONARY ARTERY BYPASS GRAFT N/A 03/01/2016   Procedure: CORONARY ARTERY BYPASS GRAFTING (CABG)x3 with endoscopic harvesting of right saphenous vein -LIMA to LAD -SVG to LEFT CIRCUMFLEX -RADIAL ARTERY to RAMUS INTERMEDIA;  Surgeon: Delight Ovens, MD;  Location: [facility] OR;  Service: Open Heart Surgery;  Laterality: N/A;   CORONARY STENT PLACEMENT  2005 AND REAPLCED 2006, 2014   CYSTOSCOPY/RETROGRADE/URETEROSCOPY/STONE EXTRACTION WITH BASKET Bilateral 08/07/2020   Procedure: CYSTOSCOPY/RETROGRADE/URETEROSCOPY, HOLMIUM LASER LITHOTRIPSY /STONE EXTRACTION WITH BASKET/ LEFT STENT PLACEMENT;  Surgeon: Jerilee Field, MD;  Location: [facility];  Service: Urology;  Laterality: Bilateral;   CYSTOSCOPY/URETEROSCOPY/HOLMIUM LASER/STENT PLACEMENT Bilateral 05/29/2018   Procedure: CYSTOSCOPY/RETROGRADE/URETEROSCOPY/HOLMIUM LASER/STENT PLACEMENT/  BASKET STONE EXTRACTION;  Surgeon: Jerilee Field, MD;  Location: [facility];  Service: Urology;  Laterality: Bilateral;   EYE EXAMINATION UNDER ANESTHESIA W/ RETINAL CRYOTHERAPY AND RETINAL LASER Right 2018   HAND SURGERY Left 1990'S   hamick bone rem   HOLMIUM LASER APPLICATION Bilateral 08/07/2020   Procedure: HOLMIUM LASER APPLICATION;  Surgeon: Jerilee Field, MD;  Location: [facility];  Service: Urology;  Laterality: Bilateral;   KNEE ARTHROSCOPY Right 10/24/2014   Procedure: ARTHROSCOPY RIGHT KNEE, Partial medial menisectomy and chondroplasty;  Surgeon: Marcene Corning, MD;  Location: [facility] OR;  Service:  Orthopedics;  Laterality: Right;   LEFT HEART CATH N/A 10/11/2012   Procedure: LEFT HEART CATH;  Surgeon: Tonny Bollman, MD;  Location: [facility] CATH LAB;  Service: Cardiovascular;  Laterality: N/A;   LITHOTRIPSY  EARLY 2000'S   X 2 OR 3   RADIAL ARTERY HARVEST Left 03/01/2016   Procedure: LEFT RADIAL ARTERY HARVEST;  Surgeon: Delight Ovens, MD;  Location: [facility] OR;  Service: Open Heart Surgery;  Laterality: Left;   SPLIT NIGHT STUDY  09/21/2015   TEE WITHOUT CARDIOVERSION N/A 03/01/2016   Procedure: TRANSESOPHAGEAL ECHOCARDIOGRAM (TEE);  Surgeon: Delight Ovens, MD;  Location: [facility] 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   Snoring 09/22/2015   Dyspnea on exertion 09/10/2014   Morbid obesity (HCC) 09/10/2014   Gastroesophageal reflux 03/03/2014   Elevated diaphragm 01/01/2013   Hyperlipidemia LDL goal <70 11/30/2012   Palpitations 11/30/2012   CAD S/P percutaneous coronary angioplasty/DES 10/12/2012   Sinus bradycardia 10/12/2012   Essential hypertension 10/12/2012   Bite, chigger 01/16/2007   Chromophytosis 01/16/2007   Blood glucose elevated 12/11/2006   Chest pain 12/08/2006   Adiposity 12/08/2006   Gain of weight 12/08/2006   Psychosexual dysfunction with inhibited sexual excitement 04/12/2006   Low back pain 07/14/2005   Carpal tunnel syndrome 07/07/2005   Anxiety disorder 06/22/2005   Osteoarthritis 01/21/2005   Healed  myocardial infarct 06/24/2003   Coronary atherosclerosis 06/23/2003    REFERRING PROVIDER: Augusto Gamble MD  REFERRING DIAG: Medial meniscus tear s/p meniscectomy and chondroplasty  THERAPY DIAG:  Chronic pain of left knee  Stiffness of left knee, not elsewhere classified  Localized edema  Rationale for Evaluation and Treatment: Rehabilitation  ONSET DATE: "Last Summer."  SUBJECTIVE:   SUBJECTIVE STATEMENT: Pt reports minimal soreness, but no really pain.  PERTINENT HISTORY: See above.  Right knee surgery. PAIN:  Are you having pain? Yes: NPRS scale: does not give a number/10. Pain location: Left knee. Pain description: Ache. Aggravating factors: Increased up time. Relieving factors: Rest.  PRECAUTIONS: Other: Pain-free(no pain increase therex).    WEIGHT BEARING RESTRICTIONS: No  FALLS:  Has patient fallen in last 6 months? No  LIVING ENVIRONMENT: Lives with: lives with their spouse Lives in: House/apartment Stairs: Avoiding upstairs at this time.  Step down into den accomplished with walker support. Has following equipment at home: Dan Humphreys - 2  wheeled  PLOF: Independent  PATIENT GOALS: Get around better with less left knee pain.  Golf.  OBJECTIVE:  Note: Objective measures were completed at Evaluation unless otherwise noted.  PATIENT SURVEYS:  FOTO 35.5  EDEMA:  Circumferential: 1 cm greater on left than right.  PALPATION: Scope sites appear to be healing well.  He c/o some lateral left knee tenderness.  LOWER EXTREMITY ROM:  In supine:  Full left knee extension and flexion to 100 degrees.    LOWER EXTREMITY MMT:  The patient is easily able to perform a left SLR against gravity without extensor lag and SAQ.   GAIT: Safe gait pattern with a FWW with a decrease in step and stride length.                                                                                                                              TREATMENT DATE:                          06/22/23            EXERCISE LOG  Exercise Repetitions and Resistance Comments  Nustep  Lvl 4 x 15 mins   Rockerboard 5 mins   Lunges 14" box x 4 mins   Forward Step Ups 6" box x 25 reps   LAQs    Seated marches     Blank cell = exercise not performed today   Modalities  Date:  Vaso: Knee, 34 degrees; low pressure, 15 mins, Pain and Edema    06/14/23:  Nustep level 3 x 16 minutes f/b rockerboard in parallel bars x 3 minutes f/b LAQ's with 4# x 4 minutes f/b gentle flexion stretching to left knee x 5 minutes f/b LE elevation x 15 minutes to patient's left knee.                               06/12/23:   EXERCISE LOG  Exercise Repetitions and Resistance Comments  Nustep Level 3 x 15 minute   Rockerboard In parallel bars x 3 minutes   SAQ's 3# x 4 minutes           In supine:  Gentle Left knee flexion stretching (low load long duration stretching technique) x 5 minutes f/b LE elevation x 20 minutes to patient's left knee.  PATIENT EDUCATION:  Education details: Discussed exercise progression that will be designed to not increase pain. Person educated: Patient Education method: Explanation Education comprehension: verbalized understanding  HOME EXERCISE PROGRAM:   ASSESSMENT:  CLINICAL IMPRESSION: Pt arrives for today's treatment session denying any pain, but does have mild soreness.  Pt able to increase FOTO score to 64 today.  Pt also able to demonstrate 123 degrees of left knee flexion, meeting his long term goal.  Pt reports continued difficulty with stair navigation, but it is getting easier.  Pt has met all of his goals at this time, except his HEP and stair navigation goal.  Normal responses to vaso noted upon removal.  Pt denies any pain at completion of today's treatment session.   OBJECTIVE IMPAIRMENTS: Abnormal gait, decreased activity tolerance, decreased ROM, increased edema, and pain.   ACTIVITY LIMITATIONS: carrying, lifting, bending, stairs, and locomotion  level  PARTICIPATION LIMITATIONS: meal prep, cleaning, laundry, shopping, and community activity  PERSONAL FACTORS: Time since onset of injury/illness/exacerbation are also affecting patient's functional outcome.   REHAB POTENTIAL: Excellent  CLINICAL DECISION MAKING: Stable/uncomplicated  EVALUATION COMPLEXITY: Low   GOALS: LONG TERM GOALS: Target date: 07/20/23.  Ind with a HEP.  Goal status: IN PROGRESS  2.  Active left knee flexion to 115 degrees+ so the patient can perform functional tasks and do so with pain not > 2-3/10.   1/23: 123 degrees Goal status: MET  3.  Perform a reciprocating stair gait with one railing with pain not > 2-3/10.  Goal status: IN PROGRESS  4.  Walk a community distance without assistive device and left knee pain not > 2-3/10.  Goal status: MET  5.  Perform ADL's with pain not > 3/10. Goal status: MET  PLAN:  PT FREQUENCY: 2x/week  PT DURATION: 6 weeks  PLANNED INTERVENTIONS: 97110-Therapeutic exercises, 97530- Therapeutic activity, [id number]- Neuromuscular re-education, 97535- Self Care, [id number]- Manual therapy, 97014- Electrical stimulation (unattended), 97016- Vasopneumatic device, Patient/Family education, Cryotherapy, and Moist heat  PLAN FOR NEXT SESSION: Nustep, Pain-free left LE therex, Gait activities, modalities as needed.     Newman Pies, PTA 06/22/2023, 11:10 AM

## 2023-06-26 ENCOUNTER — Ambulatory Visit: Payer: No Typology Code available for payment source

## 2023-06-26 DIAGNOSIS — G8929 Other chronic pain: Secondary | ICD-10-CM

## 2023-06-26 DIAGNOSIS — R600 Localized edema: Secondary | ICD-10-CM

## 2023-06-26 DIAGNOSIS — M25562 Pain in left knee: Secondary | ICD-10-CM | POA: Diagnosis not present

## 2023-06-26 DIAGNOSIS — M25662 Stiffness of left knee, not elsewhere classified: Secondary | ICD-10-CM

## 2023-06-26 NOTE — Therapy (Signed)
OUTPATIENT PHYSICAL THERAPY LOWER EXTREMITY TREATMENT   Patient Name: Hayden Rivera MRN: [id number] DOB:12-31-1950, 73 y.o., male Today's Date: 06/26/2023  END OF SESSION:  PT End of Session - 06/26/23 1020     Visit Number 6    Number of Visits 12    Date for PT Re-Evaluation 07/20/23    PT Start Time 1015    PT Stop Time 1100    PT Time Calculation (min) 45 min    Activity Tolerance Patient tolerated treatment well    Behavior During Therapy [facility] for tasks assessed/performed               Past Medical History:  Diagnosis Date   Anginal pain (HCC)    occ   Aortic stenosis 06/16/2017   Mild, noted on ECHO   Arthritis    Atrial fibrillation (HCC) 02/2016   post op   CAD (coronary artery disease)    a. Anterior STEMI 2005 c/b vfib arrest, PCI to LAD at [facility]. b. Stent thrombosis 2006 with DES within prior LAD stent at [facility]. C. 09/2012: s/p balloon angioplasty to mLAD for severe stenosis in previously stented segment & DES to LCx; initial enz neg but ruled in for NSTEMI after post-cath vagal sx, felt d/t distal emboliz of thrombus during case.   Cancer (HCC)    skin ca melanoma, pt states was removed from back   Chronic back pain    Dependent edema    Mild, occ NONE RECENT   Diarrhea    DM type 2 (diabetes mellitus, type 2) (HCC)    diet controlled   Elevated hemidiaphragm    a. Noted 09/2012 - instructed to f/u PCP.   Heart murmur    History of kidney stones    Hyperlipidemia    Hypertension    Ischemic cardiomyopathy    a. Unclear prior EF but pt was told heart was weakened in past. b. EF normal 09/2012.   Myocardial infarction (HCC) 2005   TOTAL MI'S 4 LAST ONE 2017   Retinal tear, right    Ventricular fibrillation (HCC)    a. VF arrest 2005 in setting of STEMI.   Wears glasses    Wears glasses    for reading   Past Surgical History:  Procedure Laterality Date   BACK SURGERY  1997   UPPER CERVICAL C 3 TO C 4 FUSION   CARDIAC CATHETERIZATION      stents x2   CARDIAC CATHETERIZATION N/A 10/02/2014   Procedure: Left Heart Cath And Coronary Angiography;  Surgeon: Laurey Morale, MD;  Location: [facility] INVASIVE CV LAB CUPID;  Service: Cardiovascular;  Laterality: N/A;   CARDIAC CATHETERIZATION N/A 02/17/2016   Procedure: Left Heart Cath and Coronary Angiography;  Surgeon: Laurey Morale, MD;  Location: [facility] INVASIVE CV LAB;  Service: Cardiovascular;  Laterality: N/A;   CORONARY ARTERY BYPASS GRAFT N/A 03/01/2016   Procedure: CORONARY ARTERY BYPASS GRAFTING (CABG)x3 with endoscopic harvesting of right saphenous vein -LIMA to LAD -SVG to LEFT CIRCUMFLEX -RADIAL ARTERY to RAMUS INTERMEDIA;  Surgeon: Delight Ovens, MD;  Location: [facility] OR;  Service: Open Heart Surgery;  Laterality: N/A;   CORONARY STENT PLACEMENT  2005 AND REAPLCED 2006, 2014   CYSTOSCOPY/RETROGRADE/URETEROSCOPY/STONE EXTRACTION WITH BASKET Bilateral 08/07/2020   Procedure: CYSTOSCOPY/RETROGRADE/URETEROSCOPY, HOLMIUM LASER LITHOTRIPSY /STONE EXTRACTION WITH BASKET/ LEFT STENT PLACEMENT;  Surgeon: Jerilee Field, MD;  Location: [facility];  Service: Urology;  Laterality: Bilateral;   CYSTOSCOPY/URETEROSCOPY/HOLMIUM LASER/STENT PLACEMENT Bilateral 05/29/2018   Procedure:  CYSTOSCOPY/RETROGRADE/URETEROSCOPY/HOLMIUM LASER/STENT PLACEMENT/ BASKET STONE EXTRACTION;  Surgeon: Jerilee Field, MD;  Location: [facility];  Service: Urology;  Laterality: Bilateral;   EYE EXAMINATION UNDER ANESTHESIA W/ RETINAL CRYOTHERAPY AND RETINAL LASER Right 2018   HAND SURGERY Left 1990'S   hamick bone rem   HOLMIUM LASER APPLICATION Bilateral 08/07/2020   Procedure: HOLMIUM LASER APPLICATION;  Surgeon: Jerilee Field, MD;  Location: [facility];  Service: Urology;  Laterality: Bilateral;   KNEE ARTHROSCOPY Right 10/24/2014   Procedure: ARTHROSCOPY RIGHT KNEE, Partial medial menisectomy and chondroplasty;  Surgeon: Marcene Corning, MD;  Location: [facility] OR;  Service:  Orthopedics;  Laterality: Right;   LEFT HEART CATH N/A 10/11/2012   Procedure: LEFT HEART CATH;  Surgeon: Tonny Bollman, MD;  Location: [facility] CATH LAB;  Service: Cardiovascular;  Laterality: N/A;   LITHOTRIPSY  EARLY 2000'S   X 2 OR 3   RADIAL ARTERY HARVEST Left 03/01/2016   Procedure: LEFT RADIAL ARTERY HARVEST;  Surgeon: Delight Ovens, MD;  Location: [facility] OR;  Service: Open Heart Surgery;  Laterality: Left;   SPLIT NIGHT STUDY  09/21/2015   TEE WITHOUT CARDIOVERSION N/A 03/01/2016   Procedure: TRANSESOPHAGEAL ECHOCARDIOGRAM (TEE);  Surgeon: Delight Ovens, MD;  Location: [facility] 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   Snoring 09/22/2015   Dyspnea on exertion 09/10/2014   Morbid obesity (HCC) 09/10/2014   Gastroesophageal reflux 03/03/2014   Elevated diaphragm 01/01/2013   Hyperlipidemia LDL goal <70 11/30/2012   Palpitations 11/30/2012   CAD S/P percutaneous coronary angioplasty/DES 10/12/2012   Sinus bradycardia 10/12/2012   Essential hypertension 10/12/2012   Bite, chigger 01/16/2007   Chromophytosis 01/16/2007   Blood glucose elevated 12/11/2006   Chest pain 12/08/2006   Adiposity 12/08/2006   Gain of weight 12/08/2006   Psychosexual dysfunction with inhibited sexual excitement 04/12/2006   Low back pain 07/14/2005   Carpal tunnel syndrome 07/07/2005   Anxiety disorder 06/22/2005   Osteoarthritis 01/21/2005   Healed  myocardial infarct 06/24/2003   Coronary atherosclerosis 06/23/2003    REFERRING PROVIDER: Augusto Gamble MD  REFERRING DIAG: Medial meniscus tear s/p meniscectomy and chondroplasty  THERAPY DIAG:  Chronic pain of left knee  Stiffness of left knee, not elsewhere classified  Localized edema  Rationale for Evaluation and Treatment: Rehabilitation  ONSET DATE: "Last Summer."  SUBJECTIVE:   SUBJECTIVE STATEMENT: Patient reports that his knee is not hurting today. He felt good after his last appointment.   PERTINENT HISTORY: See above.  Right knee surgery. PAIN:  Are you having pain? Yes: NPRS scale: 0/10. Pain location: Left knee. Pain description: Ache. Aggravating factors: Increased up time. Relieving factors: Rest.  PRECAUTIONS: Other: Pain-free(no pain increase therex).    WEIGHT BEARING RESTRICTIONS: No  FALLS:  Has patient fallen in last 6 months? No  LIVING ENVIRONMENT: Lives with: lives with their spouse Lives in: House/apartment Stairs: Avoiding upstairs at this time.  Step down into den accomplished with walker support.  Has following equipment at home: Walker - 2 wheeled  PLOF: Independent  PATIENT GOALS: Get around better with less left knee pain.  Golf.  OBJECTIVE:  Note: Objective measures were completed at Evaluation unless otherwise noted.  PATIENT SURVEYS:  FOTO 35.5  EDEMA:  Circumferential: 1 cm greater on left than right.  PALPATION: Scope sites appear to be healing well.  He c/o some lateral left knee tenderness.  LOWER EXTREMITY ROM:  In supine:  Full left knee extension and flexion to 100 degrees.    LOWER EXTREMITY MMT:  The patient is easily able to perform a left SLR against gravity without extensor lag and SAQ.   GAIT: Safe gait pattern with a FWW with a decrease in step and stride length.                                                                                                                              TREATMENT  DATE:                                    06/26/23 EXERCISE LOG  Exercise Repetitions and Resistance Comments  Nustep  L4 x 15 minutes   Rocker board  5 minutes   Eccentric step down  6" step x 20 reps  LLE only   LAQ 5# x 30 reps  BLE   Standing HS curl  5# x 20 reps each  Alternating LE  Tandem stance on foam  3 x 30 seconds each  Without UE support   Blank cell = exercise not performed today               06/22/23            EXERCISE LOG  Exercise Repetitions and Resistance Comments  Nustep  Lvl 4 x 15 mins   Rockerboard 5 mins   Lunges 14" box x 4 mins   Forward Step Ups 6" box x 25 reps   LAQs    Seated marches     Blank cell = exercise not performed today   Modalities  Date:  Vaso: Knee, 34 degrees; low pressure, 15 mins, Pain and Edema    06/14/23:  Nustep level 3 x 16 minutes f/b rockerboard in parallel bars x 3 minutes f/b LAQ's with 4# x 4 minutes f/b gentle flexion stretching to left knee x 5 minutes f/b LE elevation x 15 minutes to patient's left knee.  PATIENT EDUCATION:  Education details: Discussed exercise progression that will be designed to not increase pain. Person educated: Patient Education method: Explanation Education comprehension: verbalized understanding  HOME EXERCISE PROGRAM:   ASSESSMENT:  CLINICAL IMPRESSION: Patient was progressed with eccentric step downs and familiar interventions for improved muscular strength needed for improved function navigating stairs. He required minimal cueing with eccentric step downs for improved quadriceps control. He experienced no pain or discomfort with  any of today's interventions. He reported that his knee felt good upon the conclusion of treatment. He continues to require skilled physical therapy to address his remaining impairments to return to his prior level of function.   OBJECTIVE IMPAIRMENTS: Abnormal gait, decreased activity tolerance, decreased ROM, increased edema, and pain.   ACTIVITY LIMITATIONS:  carrying, lifting, bending, stairs, and locomotion level  PARTICIPATION LIMITATIONS: meal prep, cleaning, laundry, shopping, and community activity  PERSONAL FACTORS: Time since onset of injury/illness/exacerbation are also affecting patient's functional outcome.   REHAB POTENTIAL: Excellent  CLINICAL DECISION MAKING: Stable/uncomplicated  EVALUATION COMPLEXITY: Low   GOALS: LONG TERM GOALS: Target date: 07/20/23.  Ind with a HEP.  Goal status: IN PROGRESS  2.  Active left knee flexion to 115 degrees+ so the patient can perform functional tasks and do so with pain not > 2-3/10.   1/23: 123 degrees Goal status: MET  3.  Perform a reciprocating stair gait with one railing with pain not > 2-3/10.  Goal status: IN PROGRESS  4.  Walk a community distance without assistive device and left knee pain not > 2-3/10.  Goal status: MET  5.  Perform ADL's with pain not > 3/10. Goal status: MET  PLAN:  PT FREQUENCY: 2x/week  PT DURATION: 6 weeks  PLANNED INTERVENTIONS: 97110-Therapeutic exercises, 97530- Therapeutic activity, [id number]- Neuromuscular re-education, 97535- Self Care, 13244- Manual therapy, 97014- Electrical stimulation (unattended), 97016- Vasopneumatic device, Patient/Family education, Cryotherapy, and Moist heat  PLAN FOR NEXT SESSION: Nustep, Pain-free left LE therex, Gait activities, modalities as needed.     Granville Lewis, PT 06/26/2023, 11:47 AM

## 2023-06-28 ENCOUNTER — Ambulatory Visit: Payer: No Typology Code available for payment source | Admitting: *Deleted

## 2023-06-28 ENCOUNTER — Encounter: Payer: Self-pay | Admitting: *Deleted

## 2023-06-28 DIAGNOSIS — M25562 Pain in left knee: Secondary | ICD-10-CM | POA: Diagnosis not present

## 2023-06-28 DIAGNOSIS — R600 Localized edema: Secondary | ICD-10-CM

## 2023-06-28 DIAGNOSIS — G8929 Other chronic pain: Secondary | ICD-10-CM

## 2023-06-28 DIAGNOSIS — M25662 Stiffness of left knee, not elsewhere classified: Secondary | ICD-10-CM

## 2023-06-28 NOTE — Therapy (Signed)
OUTPATIENT PHYSICAL THERAPY LOWER EXTREMITY TREATMENT   Patient Name: Hayden Rivera MRN: [id number] DOB:07-Jun-1950, 73 y.o., male Today's Date: 06/28/2023  END OF SESSION:  PT End of Session - 06/28/23 1017     Visit Number 7    Number of Visits 12    Date for PT Re-Evaluation 07/20/23    PT Start Time 1015    PT Stop Time 1114    PT Time Calculation (min) 59 min               Past Medical History:  Diagnosis Date   Anginal pain (HCC)    occ   Aortic stenosis 06/16/2017   Mild, noted on ECHO   Arthritis    Atrial fibrillation (HCC) 02/2016   post op   CAD (coronary artery disease)    a. Anterior STEMI 2005 c/b vfib arrest, PCI to LAD at [facility]. b. Stent thrombosis 2006 with DES within prior LAD stent at [facility]. C. 09/2012: s/p balloon angioplasty to mLAD for severe stenosis in previously stented segment & DES to LCx; initial enz neg but ruled in for NSTEMI after post-cath vagal sx, felt d/t distal emboliz of thrombus during case.   Cancer (HCC)    skin ca melanoma, pt states was removed from back   Chronic back pain    Dependent edema    Mild, occ NONE RECENT   Diarrhea    DM type 2 (diabetes mellitus, type 2) (HCC)    diet controlled   Elevated hemidiaphragm    a. Noted 09/2012 - instructed to f/u PCP.   Heart murmur    History of kidney stones    Hyperlipidemia    Hypertension    Ischemic cardiomyopathy    a. Unclear prior EF but pt was told heart was weakened in past. b. EF normal 09/2012.   Myocardial infarction (HCC) 2005   TOTAL MI'S 4 LAST ONE 2017   Retinal tear, right    Ventricular fibrillation (HCC)    a. VF arrest 2005 in setting of STEMI.   Wears glasses    Wears glasses    for reading   Past Surgical History:  Procedure Laterality Date   BACK SURGERY  1997   UPPER CERVICAL C 3 TO C 4 FUSION   CARDIAC CATHETERIZATION     stents x2   CARDIAC CATHETERIZATION N/A 10/02/2014   Procedure: Left Heart Cath And Coronary Angiography;  Surgeon: Laurey Morale, MD;  Location: [facility] INVASIVE CV LAB CUPID;  Service: Cardiovascular;  Laterality: N/A;   CARDIAC CATHETERIZATION N/A 02/17/2016   Procedure: Left Heart Cath and Coronary Angiography;  Surgeon: Laurey Morale, MD;  Location: [facility] INVASIVE CV LAB;  Service: Cardiovascular;  Laterality: N/A;   CORONARY ARTERY BYPASS GRAFT N/A 03/01/2016   Procedure: CORONARY ARTERY BYPASS GRAFTING (CABG)x3 with endoscopic harvesting of right saphenous vein -LIMA to LAD -SVG to LEFT CIRCUMFLEX -RADIAL ARTERY to RAMUS INTERMEDIA;  Surgeon: Delight Ovens, MD;  Location: [facility] OR;  Service: Open Heart Surgery;  Laterality: N/A;   CORONARY STENT PLACEMENT  2005 AND REAPLCED 2006, 2014   CYSTOSCOPY/RETROGRADE/URETEROSCOPY/STONE EXTRACTION WITH BASKET Bilateral 08/07/2020   Procedure: CYSTOSCOPY/RETROGRADE/URETEROSCOPY, HOLMIUM LASER LITHOTRIPSY /STONE EXTRACTION WITH BASKET/ LEFT STENT PLACEMENT;  Surgeon: Jerilee Field, MD;  Location: [facility];  Service: Urology;  Laterality: Bilateral;   CYSTOSCOPY/URETEROSCOPY/HOLMIUM LASER/STENT PLACEMENT Bilateral 05/29/2018   Procedure: CYSTOSCOPY/RETROGRADE/URETEROSCOPY/HOLMIUM LASER/STENT PLACEMENT/ BASKET STONE EXTRACTION;  Surgeon: Jerilee Field, MD;  Location: [facility];  Service:  Urology;  Laterality: Bilateral;   EYE EXAMINATION UNDER ANESTHESIA W/ RETINAL CRYOTHERAPY AND RETINAL LASER Right 2018   HAND SURGERY Left 1990'S   hamick bone rem   HOLMIUM LASER APPLICATION Bilateral 08/07/2020   Procedure: HOLMIUM LASER APPLICATION;  Surgeon: Jerilee Field, MD;  Location: [facility];  Service: Urology;  Laterality: Bilateral;   KNEE ARTHROSCOPY Right 10/24/2014   Procedure: ARTHROSCOPY RIGHT KNEE, Partial medial menisectomy and chondroplasty;  Surgeon: Marcene Corning, MD;  Location: [facility] OR;  Service: Orthopedics;  Laterality: Right;   LEFT HEART CATH N/A 10/11/2012   Procedure: LEFT HEART CATH;  Surgeon: Tonny Bollman,  MD;  Location: [facility] CATH LAB;  Service: Cardiovascular;  Laterality: N/A;   LITHOTRIPSY  EARLY 2000'S   X 2 OR 3   RADIAL ARTERY HARVEST Left 03/01/2016   Procedure: LEFT RADIAL ARTERY HARVEST;  Surgeon: Delight Ovens, MD;  Location: [facility] OR;  Service: Open Heart Surgery;  Laterality: Left;   SPLIT NIGHT STUDY  09/21/2015   TEE WITHOUT CARDIOVERSION N/A 03/01/2016   Procedure: TRANSESOPHAGEAL ECHOCARDIOGRAM (TEE);  Surgeon: Delight Ovens, MD;  Location: [facility] 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   Snoring 09/22/2015   Dyspnea on exertion 09/10/2014   Morbid obesity (HCC) 09/10/2014   Gastroesophageal reflux 03/03/2014   Elevated diaphragm 01/01/2013   Hyperlipidemia LDL goal <70 11/30/2012   Palpitations 11/30/2012   CAD S/P percutaneous coronary angioplasty/DES 10/12/2012   Sinus bradycardia 10/12/2012   Essential hypertension 10/12/2012   Bite, chigger 01/16/2007   Chromophytosis 01/16/2007   Blood glucose elevated 12/11/2006   Chest pain 12/08/2006   Adiposity 12/08/2006   Gain of weight 12/08/2006   Psychosexual dysfunction with inhibited sexual excitement 04/12/2006   Low back pain 07/14/2005   Carpal tunnel syndrome 07/07/2005   Anxiety disorder 06/22/2005   Osteoarthritis 01/21/2005   Healed myocardial infarct 06/24/2003   Coronary atherosclerosis 06/23/2003    REFERRING PROVIDER: Augusto Gamble MD  REFERRING  DIAG: Medial meniscus tear s/p meniscectomy and chondroplasty  THERAPY DIAG:  Chronic pain of left knee  Stiffness of left knee, not elsewhere classified  Localized edema  Rationale for Evaluation and Treatment: Rehabilitation  ONSET DATE: "Last Summer."  SUBJECTIVE:   SUBJECTIVE STATEMENT: Patient reports that his LT sore when he woke this AM  PERTINENT HISTORY: See above.  Right knee surgery. PAIN:  Are you having pain? Yes: NPRS scale: 3-4/10. Pain location: Left knee. Pain description: Ache. Aggravating factors: Increased up time. Relieving factors: Rest.  PRECAUTIONS: Other: Pain-free(no pain increase therex).    WEIGHT BEARING RESTRICTIONS: No  FALLS:  Has patient fallen in last 6 months? No  LIVING ENVIRONMENT: Lives with: lives with their spouse Lives in: House/apartment Stairs: Avoiding upstairs at this time.  Step down into den accomplished with walker support. Has following equipment at home: Walker - 2 wheeled  PLOF: Independent  PATIENT GOALS: Get around better with less left knee pain.  Golf.  OBJECTIVE:  Note: Objective measures were completed at Evaluation unless otherwise noted.  PATIENT SURVEYS:  FOTO 35.5  EDEMA:  Circumferential: 1 cm greater on left than right.  PALPATION: Scope sites appear to be healing well.  He c/o some lateral left knee tenderness.  LOWER EXTREMITY ROM:  In supine:  Full left knee extension and flexion to 100 degrees.    LOWER EXTREMITY MMT:  The patient is easily able to perform a left SLR against gravity without extensor lag and SAQ.   GAIT: Safe gait pattern with a FWW with a decrease in step and stride length.                                                                                                                              TREATMENT DATE:    LT knee                                   06/28/23 EXERCISE LOG   Exercise Repetitions and Resistance Comments  Nustep  L4 x 16 minutes seat 11 1  mile  Rocker board  6 minutes  DF/PF balance   Eccentric step down   LLE only   Step up 6 in x 10   LAQ 5#  3x 10 reps   pause at top 3-5 secs  BLE   Standing HS curl  5# x 20 reps each  Alternating LE  Tandem stance on foam   Without UE support   Blank cell = exercise not performed today  Vaso: Knee, 34 degrees; low pressure, 15 mins, Pain and Edema              06/22/23            EXERCISE LOG  Exercise Repetitions and Resistance Comments  Nustep  Lvl 4 x 15 mins   Rockerboard 5 mins   Lunges 14" box x 4 mins   Forward Step Ups 6" box x 25 reps   LAQs    Seated marches     Blank cell = exercise not performed today   Modalities  Date:  Vaso: Knee, 34 degrees; low pressure, 15 mins, Pain and Edema    06/14/23:  Nustep level 3 x 16 minutes f/b rockerboard in parallel bars x 3 minutes f/b LAQ's with 4# x 4 minutes f/b gentle flexion stretching to left knee x 5 minutes f/b LE elevation x 15 minutes to patient's left knee.  PATIENT EDUCATION:  Education details: Discussed exercise progression that will be designed to not increase pain. Person educated: Patient Education method: Explanation Education comprehension: verbalized understanding  HOME EXERCISE PROGRAM:   ASSESSMENT:  CLINICAL IMPRESSION: Patient  arrived today doing fairly well with LT knee with mainly soreness. He was able to continue with LT LE strengthening as well as balance act's. Pt able to perform OKC and CKC exs with mainly  fatigue and no increased pain today. Vaso end of session. Pain 2/10 end of session    OBJECTIVE IMPAIRMENTS: Abnormal gait, decreased activity tolerance, decreased ROM, increased edema, and pain.   ACTIVITY LIMITATIONS: carrying, lifting, bending, stairs, and locomotion level  PARTICIPATION LIMITATIONS: meal prep, cleaning, laundry, shopping, and community activity  PERSONAL FACTORS: Time since onset of injury/illness/exacerbation are also affecting patient's functional outcome.    REHAB POTENTIAL: Excellent  CLINICAL DECISION MAKING: Stable/uncomplicated  EVALUATION COMPLEXITY: Low   GOALS: LONG TERM GOALS: Target date: 07/20/23.  Ind with a HEP.  Goal status: IN PROGRESS  2.  Active left knee flexion to 115 degrees+ so the patient can perform functional tasks and do so with pain not > 2-3/10.   1/23: 123 degrees Goal status: MET  3.  Perform a reciprocating stair gait with one railing with pain not > 2-3/10.  Goal status: IN PROGRESS  4.  Walk a community distance without assistive device and left knee pain not > 2-3/10.  Goal status: MET  5.  Perform ADL's with pain not > 3/10. Goal status: MET  PLAN:  PT FREQUENCY: 2x/week  PT DURATION: 6 weeks  PLANNED INTERVENTIONS: 97110-Therapeutic exercises, 97530- Therapeutic activity, [id number]- Neuromuscular re-education, 97535- Self Care, [id number]- Manual therapy, 97014- Electrical stimulation (unattended), 97016- Vasopneumatic device, Patient/Family education, Cryotherapy, and Moist heat  PLAN FOR NEXT SESSION: Nustep, Pain-free left LE therex, Gait activities, modalities as needed.     Mckennah Kretchmer,CHRIS, PTA 06/28/2023, 1:08 PM

## 2023-07-03 ENCOUNTER — Ambulatory Visit: Payer: No Typology Code available for payment source | Attending: Orthopedic Surgery

## 2023-07-03 DIAGNOSIS — R600 Localized edema: Secondary | ICD-10-CM | POA: Diagnosis present

## 2023-07-03 DIAGNOSIS — G8929 Other chronic pain: Secondary | ICD-10-CM | POA: Insufficient documentation

## 2023-07-03 DIAGNOSIS — M25562 Pain in left knee: Secondary | ICD-10-CM | POA: Insufficient documentation

## 2023-07-03 DIAGNOSIS — M25662 Stiffness of left knee, not elsewhere classified: Secondary | ICD-10-CM | POA: Insufficient documentation

## 2023-07-03 NOTE — Therapy (Signed)
OUTPATIENT PHYSICAL THERAPY LOWER EXTREMITY TREATMENT   Patient Name: Hayden Rivera MRN: [id number] DOB:[birth year], 73 y.o., male Today's Date: 07/03/2023  END OF SESSION:  PT End of Session - 07/03/23 0932     Visit Number 8    Number of Visits 12    Date for PT Re-Evaluation 07/20/23    PT Start Time 0930    PT Stop Time 1017    PT Time Calculation (min) 47 min    Activity Tolerance Patient tolerated treatment well    Behavior During Therapy [facility] for tasks assessed/performed                Past Medical History:  Diagnosis Date   Anginal pain (HCC)    occ   Aortic stenosis 06/16/2017   Mild, noted on ECHO   Arthritis    Atrial fibrillation (HCC) 02/2016   post op   CAD (coronary artery disease)    a. Anterior STEMI 2005 c/b vfib arrest, PCI to LAD at [facility] b. Stent thrombosis 2006 with DES within prior LAD stent at [facility]. C. 09/2012: s/p balloon angioplasty to mLAD for severe stenosis in previously stented segment & DES to LCx; initial enz neg but ruled in for NSTEMI after post-cath vagal sx, felt d/t distal emboliz of thrombus during case.   Cancer (HCC)    skin ca melanoma, pt states was removed from back   Chronic back pain    Dependent edema    Mild, occ NONE RECENT   Diarrhea    DM type 2 (diabetes mellitus, type 2) (HCC)    diet controlled   Elevated hemidiaphragm    a. Noted 09/2012 - instructed to f/u PCP.   Heart murmur    History of kidney stones    Hyperlipidemia    Hypertension    Ischemic cardiomyopathy    a. Unclear prior EF but pt was told heart was weakened in past. b. EF normal 09/2012.   Myocardial infarction (HCC) 2005   TOTAL MI'S 4 LAST ONE 2017   Retinal tear, right    Ventricular fibrillation (HCC)    a. VF arrest 2005 in setting of STEMI.   Wears glasses    Wears glasses    for reading   Past Surgical History:  Procedure Laterality Date   BACK SURGERY  1997   UPPER CERVICAL C 3 TO C 4 FUSION   CARDIAC CATHETERIZATION      stents x2   CARDIAC CATHETERIZATION N/A 10/02/2014   Procedure: Left Heart Cath And Coronary Angiography;  Surgeon: Laurey Morale, MD;  Location: [facility] INVASIVE CV LAB CUPID;  Service: Cardiovascular;  Laterality: N/A;   CARDIAC CATHETERIZATION N/A 02/17/2016   Procedure: Left Heart Cath and Coronary Angiography;  Surgeon: Laurey Morale, MD;  Location: [facility] INVASIVE CV LAB;  Service: Cardiovascular;  Laterality: N/A;   CORONARY ARTERY BYPASS GRAFT N/A 03/01/2016   Procedure: CORONARY ARTERY BYPASS GRAFTING (CABG)x3 with endoscopic harvesting of right saphenous vein -LIMA to LAD -SVG to LEFT CIRCUMFLEX -RADIAL ARTERY to RAMUS INTERMEDIA;  Surgeon: Delight Ovens, MD;  Location: [facility] OR;  Service: Open Heart Surgery;  Laterality: N/A;   CORONARY STENT PLACEMENT  2005 AND REAPLCED 2006, 2014   CYSTOSCOPY/RETROGRADE/URETEROSCOPY/STONE EXTRACTION WITH BASKET Bilateral 08/07/2020   Procedure: CYSTOSCOPY/RETROGRADE/URETEROSCOPY, HOLMIUM LASER LITHOTRIPSY /STONE EXTRACTION WITH BASKET/ LEFT STENT PLACEMENT;  Surgeon: Jerilee Field, MD;  Location: [facility];  Service: Urology;  Laterality: Bilateral;   CYSTOSCOPY/URETEROSCOPY/HOLMIUM LASER/STENT PLACEMENT Bilateral 05/29/2018  Procedure: CYSTOSCOPY/RETROGRADE/URETEROSCOPY/HOLMIUM LASER/STENT PLACEMENT/ BASKET STONE EXTRACTION;  Surgeon: Jerilee Field, MD;  Location: [facility];  Service: Urology;  Laterality: Bilateral;   EYE EXAMINATION UNDER ANESTHESIA W/ RETINAL CRYOTHERAPY AND RETINAL LASER Right 2018   HAND SURGERY Left 1990'S   hamick bone rem   HOLMIUM LASER APPLICATION Bilateral 08/07/2020   Procedure: HOLMIUM LASER APPLICATION;  Surgeon: Jerilee Field, MD;  Location: [facility];  Service: Urology;  Laterality: Bilateral;   KNEE ARTHROSCOPY Right 10/24/2014   Procedure: ARTHROSCOPY RIGHT KNEE, Partial medial menisectomy and chondroplasty;  Surgeon: Marcene Corning, MD;  Location: [facility] OR;  Service:  Orthopedics;  Laterality: Right;   LEFT HEART CATH N/A 10/11/2012   Procedure: LEFT HEART CATH;  Surgeon: Tonny Bollman, MD;  Location: [facility] CATH LAB;  Service: Cardiovascular;  Laterality: N/A;   LITHOTRIPSY  EARLY 2000'S   X 2 OR 3   RADIAL ARTERY HARVEST Left 03/01/2016   Procedure: LEFT RADIAL ARTERY HARVEST;  Surgeon: Delight Ovens, MD;  Location: [facility] OR;  Service: Open Heart Surgery;  Laterality: Left;   SPLIT NIGHT STUDY  09/21/2015   TEE WITHOUT CARDIOVERSION N/A 03/01/2016   Procedure: TRANSESOPHAGEAL ECHOCARDIOGRAM (TEE);  Surgeon: Delight Ovens, MD;  Location: [facility] 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   Snoring 09/22/2015   Dyspnea on exertion 09/10/2014   Morbid obesity (HCC) 09/10/2014   Gastroesophageal reflux 03/03/2014   Elevated diaphragm 01/01/2013   Hyperlipidemia LDL goal <70 11/30/2012   Palpitations 11/30/2012   CAD S/P percutaneous coronary angioplasty/DES 10/12/2012   Sinus bradycardia 10/12/2012   Essential hypertension 10/12/2012   Bite, chigger 01/16/2007   Chromophytosis 01/16/2007   Blood glucose elevated 12/11/2006   Chest pain 12/08/2006   Adiposity 12/08/2006   Gain of weight 12/08/2006   Psychosexual dysfunction with inhibited sexual excitement 04/12/2006   Low back pain 07/14/2005   Carpal tunnel syndrome 07/07/2005   Anxiety disorder 06/22/2005   Osteoarthritis 01/21/2005   Healed  myocardial infarct 06/24/2003   Coronary atherosclerosis 06/23/2003    REFERRING PROVIDER: Augusto Gamble MD  REFERRING DIAG: Medial meniscus tear s/p meniscectomy and chondroplasty  THERAPY DIAG:  Chronic pain of left knee  Stiffness of left knee, not elsewhere classified  Localized edema  Rationale for Evaluation and Treatment: Rehabilitation  ONSET DATE: "Last Summer."  SUBJECTIVE:   SUBJECTIVE STATEMENT: Patient reports that his knee is not hurting, but it is still really sore.   PERTINENT HISTORY: See above.  Right knee surgery. PAIN:  Are you having pain? Yes: NPRS scale: 4/10. Pain location: Left knee. Pain description: Ache. Aggravating factors: Increased up time. Relieving factors: Rest.  PRECAUTIONS: Other: Pain-free(no pain increase therex).    WEIGHT BEARING RESTRICTIONS: No  FALLS:  Has patient fallen in last 6 months? No  LIVING ENVIRONMENT: Lives with: lives with their spouse Lives in: House/apartment Stairs: Avoiding upstairs at this time.  Step down into den accomplished with walker support. Has  following equipment at home: Dan Humphreys - 2 wheeled  PLOF: Independent  PATIENT GOALS: Get around better with less left knee pain.  Golf.  OBJECTIVE:  Note: Objective measures were completed at Evaluation unless otherwise noted.  PATIENT SURVEYS:  FOTO 35.5  EDEMA:  Circumferential: 1 cm greater on left than right.  PALPATION: Scope sites appear to be healing well.  He c/o some lateral left knee tenderness.  LOWER EXTREMITY ROM:  In supine:  Full left knee extension and flexion to 100 degrees.    LOWER EXTREMITY MMT:  The patient is easily able to perform a left SLR against gravity without extensor lag and SAQ.   GAIT: Safe gait pattern with a FWW with a decrease in step and stride length.                                                                                                                              TREATMENT DATE:                                       07/03/23 EXERCISE LOG  Exercise Repetitions and Resistance Comments  Nustep  L4 x 15 minutes; seat 9    Rocker board  5 minutes For gastrocnemius/soleus soft tissue extensibility  Step up 6" step x 2 minutes  With 1 railing to simulate household mobility  Eccentric heel tap  6" step x 2 minutes  LLE on step; for improved eccentric quadriceps control for descending stairs  Weighted lumbar rotation on foam pad  4# x 1.5 minutes each  For stability need to return to golf   Lunges onto BOSU 2 minutes each   Sit to stand  10 reps Without UE support and foam pad under feet    Blank cell = exercise not performed today                                    06/28/23 EXERCISE LOG   Exercise Repetitions and Resistance Comments  Nustep  L4 x 16 minutes seat 11 1 mile  Rocker board  6 minutes  DF/PF balance   Eccentric step down   LLE only   Step up 6 in x 10   LAQ 5#  3x 10 reps   pause at top 3-5 secs  BLE   Standing HS curl  5# x 20 reps each  Alternating LE  Tandem stance on foam   Without UE support   Blank cell = exercise not performed today  Vaso: Knee, 34 degrees; low pressure, 15 mins, Pain and Edema              06/22/23            EXERCISE LOG  Exercise Repetitions and Resistance Comments  Nustep  Lvl 4  x 15 mins   Rockerboard 5 mins   Lunges 14" box x 4 mins   Forward Step Ups 6" box x 25 reps   LAQs    Seated marches     Blank cell = exercise not performed today   Modalities  Date:  Vaso: Knee, 34 degrees; low pressure, 15 mins, Pain and Edema  PATIENT EDUCATION:  Education details: returning to golf and plan of care Person educated: Patient Education method: Explanation Education comprehension: verbalized understanding  HOME EXERCISE PROGRAM:   ASSESSMENT:  CLINICAL IMPRESSION: Today's treatment focused on improved functional stability needed to return to activities such as golfing. He required minimal cueing with eccentric heel taps for  improved eccentric quadriceps control needed to safely descend stairs. He experienced no increase in pain or discomfort with any of today's interventions. He reported that his knee felt alright upon the conclusion of treatment. He continues to require skilled physical therapy to address his remaining impairments to return to his prior level of function.   OBJECTIVE IMPAIRMENTS: Abnormal gait, decreased activity tolerance, decreased ROM, increased edema, and pain.   ACTIVITY LIMITATIONS: carrying, lifting, bending, stairs, and locomotion level  PARTICIPATION LIMITATIONS: meal prep, cleaning, laundry, shopping, and community activity  PERSONAL FACTORS: Time since onset of injury/illness/exacerbation are also affecting patient's functional outcome.   REHAB POTENTIAL: Excellent  CLINICAL DECISION MAKING: Stable/uncomplicated  EVALUATION COMPLEXITY: Low   GOALS: LONG TERM GOALS: Target date: 07/20/23.  Ind with a HEP.  Goal status: IN PROGRESS  2.  Active left knee flexion to 115 degrees+ so the patient can perform functional tasks and do so with pain not > 2-3/10.   1/23: 123 degrees Goal status: MET  3.  Perform a reciprocating stair gait with one railing with pain not > 2-3/10.  Goal status: IN PROGRESS  4.  Walk a community distance without assistive device and left knee pain not > 2-3/10.  Goal status: MET  5.  Perform ADL's with pain not > 3/10. Goal status: MET  PLAN:  PT FREQUENCY: 2x/week  PT DURATION: 6 weeks  PLANNED INTERVENTIONS: 97110-Therapeutic exercises, 97530- Therapeutic activity, [id number]- Neuromuscular re-education, 97535- Self Care, 91478- Manual therapy, 97014- Electrical stimulation (unattended), 97016- Vasopneumatic device, Patient/Family education, Cryotherapy, and Moist heat  PLAN FOR NEXT SESSION: Nustep, Pain-free left LE therex, Gait activities, modalities as needed.     Granville Lewis, PT 07/03/2023, 12:04 PM

## 2023-07-05 ENCOUNTER — Ambulatory Visit: Payer: No Typology Code available for payment source | Admitting: Physical Therapy

## 2023-07-05 DIAGNOSIS — M25662 Stiffness of left knee, not elsewhere classified: Secondary | ICD-10-CM

## 2023-07-05 DIAGNOSIS — G8929 Other chronic pain: Secondary | ICD-10-CM

## 2023-07-05 DIAGNOSIS — M25562 Pain in left knee: Secondary | ICD-10-CM | POA: Diagnosis not present

## 2023-07-05 DIAGNOSIS — R600 Localized edema: Secondary | ICD-10-CM

## 2023-07-05 NOTE — Therapy (Signed)
 OUTPATIENT PHYSICAL THERAPY LOWER EXTREMITY TREATMENT   Patient Name: Hayden Rivera MRN: [id number] DOB:[birth year], 73 y.o., male Today's Date: 07/05/2023  END OF SESSION:  PT End of Session - 07/05/23 1032     Visit Number 9    Number of Visits 12    Date for PT Re-Evaluation 07/20/23    PT Start Time 1015    PT Stop Time 1107    PT Time Calculation (min) 52 min    Activity Tolerance Patient tolerated treatment well    Behavior During Therapy [facility] for tasks assessed/performed                Past Medical History:  Diagnosis Date   Anginal pain (HCC)    occ   Aortic stenosis 06/16/2017   Mild, noted on ECHO   Arthritis    Atrial fibrillation (HCC) 02/2016   post op   CAD (coronary artery disease)    a. Anterior STEMI 2005 c/b vfib arrest, PCI to LAD at [facility]. b. Stent thrombosis 2006 with DES within prior LAD stent at [facility]. C. 09/2012: s/p balloon angioplasty to mLAD for severe stenosis in previously stented segment & DES to LCx; initial enz neg but ruled in for NSTEMI after post-cath vagal sx, felt d/t distal emboliz of thrombus during case.   Cancer (HCC)    skin ca melanoma, pt states was removed from back   Chronic back pain    Dependent edema    Mild, occ NONE RECENT   Diarrhea    DM type 2 (diabetes mellitus, type 2) (HCC)    diet controlled   Elevated hemidiaphragm    a. Noted 09/2012 - instructed to f/u PCP.   Heart murmur    History of kidney stones    Hyperlipidemia    Hypertension    Ischemic cardiomyopathy    a. Unclear prior EF but pt was told heart was weakened in past. b. EF normal 09/2012.   Myocardial infarction (HCC) 2005   TOTAL MI'S 4 LAST ONE 2017   Retinal tear, right    Ventricular fibrillation (HCC)    a. VF arrest 2005 in setting of STEMI.   Wears glasses    Wears glasses    for reading   Past Surgical History:  Procedure Laterality Date   BACK SURGERY  1997   UPPER CERVICAL C 3 TO C 4 FUSION   CARDIAC CATHETERIZATION      stents x2   CARDIAC CATHETERIZATION N/A 10/02/2014   Procedure: Left Heart Cath And Coronary Angiography;  Surgeon: Ezra GORMAN Shuck, MD;  Location: [facility] INVASIVE CV LAB CUPID;  Service: Cardiovascular;  Laterality: N/A;   CARDIAC CATHETERIZATION N/A 02/17/2016   Procedure: Left Heart Cath and Coronary Angiography;  Surgeon: Ezra GORMAN Shuck, MD;  Location: [facility] INVASIVE CV LAB;  Service: Cardiovascular;  Laterality: N/A;   CORONARY ARTERY BYPASS GRAFT N/A 03/01/2016   Procedure: CORONARY ARTERY BYPASS GRAFTING (CABG)x3 with endoscopic harvesting of right saphenous vein -LIMA to LAD -SVG to LEFT CIRCUMFLEX -RADIAL ARTERY to RAMUS INTERMEDIA;  Surgeon: Dallas KATHEE Jude, MD;  Location: [facility] OR;  Service: Open Heart Surgery;  Laterality: N/A;   CORONARY STENT PLACEMENT  2005 AND REAPLCED 2006, 2014   CYSTOSCOPY/RETROGRADE/URETEROSCOPY/STONE EXTRACTION WITH BASKET Bilateral 08/07/2020   Procedure: CYSTOSCOPY/RETROGRADE/URETEROSCOPY, HOLMIUM LASER LITHOTRIPSY /STONE EXTRACTION WITH BASKET/ LEFT STENT PLACEMENT;  Surgeon: Nieves Cough, MD;  Location: [facility];  Service: Urology;  Laterality: Bilateral;   CYSTOSCOPY/URETEROSCOPY/HOLMIUM LASER/STENT PLACEMENT Bilateral 05/29/2018  Procedure: CYSTOSCOPY/RETROGRADE/URETEROSCOPY/HOLMIUM LASER/STENT PLACEMENT/ BASKET STONE EXTRACTION;  Surgeon: Nieves Cough, MD;  Location: [facility];  Service: Urology;  Laterality: Bilateral;   EYE EXAMINATION UNDER ANESTHESIA W/ RETINAL CRYOTHERAPY AND RETINAL LASER Right 2018   HAND SURGERY Left 1990'S   hamick bone rem   HOLMIUM LASER APPLICATION Bilateral 08/07/2020   Procedure: HOLMIUM LASER APPLICATION;  Surgeon: Nieves Cough, MD;  Location: [facility];  Service: Urology;  Laterality: Bilateral;   KNEE ARTHROSCOPY Right 10/24/2014   Procedure: ARTHROSCOPY RIGHT KNEE, Partial medial menisectomy and chondroplasty;  Surgeon: Maude Herald, MD;  Location: [facility] OR;  Service:  Orthopedics;  Laterality: Right;   LEFT HEART CATH N/A 10/11/2012   Procedure: LEFT HEART CATH;  Surgeon: Ozell Fell, MD;  Location: [facility] CATH LAB;  Service: Cardiovascular;  Laterality: N/A;   LITHOTRIPSY  EARLY 2000'S   X 2 OR 3   RADIAL ARTERY HARVEST Left 03/01/2016   Procedure: LEFT RADIAL ARTERY HARVEST;  Surgeon: Dallas KATHEE Jude, MD;  Location: [facility] OR;  Service: Open Heart Surgery;  Laterality: Left;   SPLIT NIGHT STUDY  09/21/2015   TEE WITHOUT CARDIOVERSION N/A 03/01/2016   Procedure: TRANSESOPHAGEAL ECHOCARDIOGRAM (TEE);  Surgeon: Dallas KATHEE Jude, MD;  Location: [facility] 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   Snoring 09/22/2015   Dyspnea on exertion 09/10/2014   Morbid obesity (HCC) 09/10/2014   Gastroesophageal reflux 03/03/2014   Elevated diaphragm 01/01/2013   Hyperlipidemia LDL goal <70 11/30/2012   Palpitations 11/30/2012   CAD S/P percutaneous coronary angioplasty/DES 10/12/2012   Sinus bradycardia 10/12/2012   Essential hypertension 10/12/2012   Bite, chigger 01/16/2007   Chromophytosis 01/16/2007   Blood glucose elevated 12/11/2006   Chest pain 12/08/2006   Adiposity 12/08/2006   Gain of weight 12/08/2006   Psychosexual dysfunction with inhibited sexual excitement 04/12/2006   Low back pain 07/14/2005   Carpal tunnel syndrome 07/07/2005   Anxiety disorder 06/22/2005   Osteoarthritis 01/21/2005   Healed  myocardial infarct 06/24/2003   Coronary atherosclerosis 06/23/2003    REFERRING PROVIDER: Elgin Ada MD  REFERRING DIAG: Medial meniscus tear s/p meniscectomy and chondroplasty  THERAPY DIAG:  Chronic pain of left knee  Stiffness of left knee, not elsewhere classified  Localized edema  Rationale for Evaluation and Treatment: Rehabilitation  ONSET DATE: Last Summer.  SUBJECTIVE:   SUBJECTIVE STATEMENT: Just sore.  PERTINENT HISTORY: See above.  Right knee surgery. PAIN:  Are you having pain? Yes: NPRS scale: 2/10. Pain location: Left knee. Pain description: Ache. Aggravating factors: Increased up time. Relieving factors: Rest.  PRECAUTIONS: Other: Pain-free(no pain increase therex).    WEIGHT BEARING RESTRICTIONS: No  FALLS:  Has patient fallen in last 6 months? No  LIVING ENVIRONMENT: Lives with: lives with their spouse Lives in: House/apartment Stairs: Avoiding upstairs at this time.  Step down into den accomplished with walker support. Has following equipment at home: Vannie - 2 wheeled  PLOF: Independent  PATIENT  GOALS: Get around better with less left knee pain.  Golf.  OBJECTIVE:  Note: Objective measures were completed at Evaluation unless otherwise noted.  PATIENT SURVEYS:  FOTO 35.5  EDEMA:  Circumferential: 1 cm greater on left than right.  PALPATION: Scope sites appear to be healing well.  He c/o some lateral left knee tenderness.  LOWER EXTREMITY ROM:  In supine:  Full left knee extension and flexion to 100 degrees.    LOWER EXTREMITY MMT:  The patient is easily able to perform a left SLR against gravity without extensor lag and SAQ.   GAIT: Safe gait pattern with a FWW with a decrease in step and stride length.                                                                                                                              TREATMENT DATE:   07/05/23:                                     EXERCISE LOG  Exercise  Repetitions and Resistance Comments  Nustep Level 4 x 10 minutes   Recumbent bike Level 3 x 10 minutes   Knee ext 10# 3 minutes   Ham curls 30# x 3 minutes        LE elevation and vasopneumatic x 20 minutes to patient's left knee.                                        07/03/23 EXERCISE LOG  Exercise Repetitions and Resistance Comments  Nustep  L4 x 15 minutes; seat 9    Rocker board  5 minutes For gastrocnemius/soleus soft tissue extensibility  Step up 6 step x 2 minutes  With 1 railing to simulate household mobility  Eccentric heel tap  6 step x 2 minutes  LLE on step; for improved eccentric quadriceps control for descending stairs  Weighted lumbar rotation on foam pad  4# x 1.5 minutes each  For stability need to return to golf   Lunges onto BOSU 2 minutes each   Sit to stand  10 reps Without UE support and foam pad under feet    Blank cell = exercise not performed today                                    06/28/23 EXERCISE LOG   Exercise Repetitions and Resistance Comments  Nustep  L4 x 16 minutes seat 11 1 mile  Rocker board  6 minutes  DF/PF balance   Eccentric step down   LLE only   Step up 6 in x 10   LAQ 5#  3x 10 reps   pause at top 3-5 secs  BLE   Standing HS curl  5# x 20 reps each  Alternating LE  Tandem stance on foam   Without UE support   Blank cell = exercise not performed today  Vaso: Knee, 34 degrees; low pressure, 15 mins, Pain and Edema              06/22/23            EXERCISE LOG  Exercise Repetitions and Resistance Comments  Nustep  Lvl 4 x 15 mins   Rockerboard 5 mins   Lunges 14 box x 4 mins   Forward Step Ups 6 box x 25 reps   LAQs    Seated marches     Blank cell = exercise not performed today   Modalities  Date:  Vaso: Knee, 34 degrees; low pressure, 15 mins, Pain and Edema  PATIENT EDUCATION:  Education details: returning to golf and plan of care Person educated: Patient Education method: Explanation Education comprehension:  verbalized understanding  HOME EXERCISE PROGRAM:   ASSESSMENT:  CLINICAL IMPRESSION: The patient did great today with the addition of recumbent bike and weight machines (knee ext and ham curls).  He performed with excellent technique without pain increase.    OBJECTIVE IMPAIRMENTS: Abnormal gait, decreased activity tolerance, decreased ROM, increased edema, and pain.   ACTIVITY LIMITATIONS: carrying, lifting, bending, stairs, and locomotion level  PARTICIPATION LIMITATIONS: meal prep, cleaning, laundry, shopping, and community activity  PERSONAL FACTORS: Time since onset of injury/illness/exacerbation are also affecting patient's functional outcome.   REHAB POTENTIAL: Excellent  CLINICAL DECISION MAKING: Stable/uncomplicated  EVALUATION COMPLEXITY: Low   GOALS: LONG TERM GOALS: Target date: 07/20/23.  Ind with a HEP.  Goal status: IN PROGRESS  2.  Active left knee flexion to 115 degrees+ so the patient can perform functional tasks and do so with pain not > 2-3/10.   1/23: 123 degrees Goal status: MET  3.  Perform a reciprocating stair gait with one railing with pain not > 2-3/10.  Goal status: IN PROGRESS  4.  Walk a community distance without assistive device and left knee pain not > 2-3/10.  Goal status: MET  5.  Perform ADL's with pain not > 3/10. Goal status: MET  PLAN:  PT FREQUENCY: 2x/week  PT DURATION: 6 weeks  PLANNED INTERVENTIONS: 97110-Therapeutic exercises, 97530- Therapeutic activity, [id number]- Neuromuscular re-education, 97535- Self Care, [id number]- Manual therapy, 97014- Electrical stimulation (unattended), 97016- Vasopneumatic device, Patient/Family education, Cryotherapy, and Moist heat  PLAN FOR NEXT SESSION: Nustep, Pain-free left LE therex, Gait activities, modalities as needed.     Tanieka Pownall, PT 07/05/2023, 11:15 AM

## 2023-07-10 ENCOUNTER — Ambulatory Visit: Payer: No Typology Code available for payment source | Admitting: Physical Therapy

## 2023-07-10 DIAGNOSIS — R600 Localized edema: Secondary | ICD-10-CM

## 2023-07-10 DIAGNOSIS — M25562 Pain in left knee: Secondary | ICD-10-CM | POA: Diagnosis not present

## 2023-07-10 DIAGNOSIS — M25662 Stiffness of left knee, not elsewhere classified: Secondary | ICD-10-CM

## 2023-07-10 DIAGNOSIS — G8929 Other chronic pain: Secondary | ICD-10-CM

## 2023-07-10 NOTE — Therapy (Signed)
 OUTPATIENT PHYSICAL THERAPY LOWER EXTREMITY TREATMENT   Patient Name: Hayden Rivera MRN: [id number] DOB:May 03, 1951, 73 y.o., male Today's Date: 07/10/2023  END OF SESSION:  PT End of Session - 07/10/23 1038     Visit Number 10    Number of Visits 12    Date for PT Re-Evaluation 07/20/23    PT Start Time 1015    PT Stop Time 1058    PT Time Calculation (min) 43 min    Activity Tolerance Patient tolerated treatment well    Behavior During Therapy [facility] for tasks assessed/performed                Past Medical History:  Diagnosis Date   Anginal pain (HCC)    occ   Aortic stenosis 06/16/2017   Mild, noted on ECHO   Arthritis    Atrial fibrillation (HCC) 02/2016   post op   CAD (coronary artery disease)    a. Anterior STEMI 2005 c/b vfib arrest, PCI to LAD at [facility]. b. Stent thrombosis 2006 with DES within prior LAD stent at [facility]. C. 09/2012: s/p balloon angioplasty to mLAD for severe stenosis in previously stented segment & DES to LCx; initial enz neg but ruled in for NSTEMI after post-cath vagal sx, felt d/t distal emboliz of thrombus during case.   Cancer (HCC)    skin ca melanoma, pt states was removed from back   Chronic back pain    Dependent edema    Mild, occ NONE RECENT   Diarrhea    DM type 2 (diabetes mellitus, type 2) (HCC)    diet controlled   Elevated hemidiaphragm    a. Noted 09/2012 - instructed to f/u PCP.   Heart murmur    History of kidney stones    Hyperlipidemia    Hypertension    Ischemic cardiomyopathy    a. Unclear prior EF but pt was told heart was weakened in past. b. EF normal 09/2012.   Myocardial infarction (HCC) 2005   TOTAL MI'S 4 LAST ONE 2017   Retinal tear, right    Ventricular fibrillation (HCC)    a. VF arrest 2005 in setting of STEMI.   Wears glasses    Wears glasses    for reading   Past Surgical History:  Procedure Laterality Date   BACK SURGERY  1997   UPPER CERVICAL C 3 TO C 4 FUSION   CARDIAC CATHETERIZATION      stents x2   CARDIAC CATHETERIZATION N/A 10/02/2014   Procedure: Left Heart Cath And Coronary Angiography;  Surgeon: Darlis Eisenmenger, MD;  Location: [facility] INVASIVE CV LAB CUPID;  Service: Cardiovascular;  Laterality: N/A;   CARDIAC CATHETERIZATION N/A 02/17/2016   Procedure: Left Heart Cath and Coronary Angiography;  Surgeon: Darlis Eisenmenger, MD;  Location: [facility] INVASIVE CV LAB;  Service: Cardiovascular;  Laterality: N/A;   CORONARY ARTERY BYPASS GRAFT N/A 03/01/2016   Procedure: CORONARY ARTERY BYPASS GRAFTING (CABG)x3 with endoscopic harvesting of right saphenous vein -LIMA to LAD -SVG to LEFT CIRCUMFLEX -RADIAL ARTERY to RAMUS INTERMEDIA;  Surgeon: Norita Beauvais, MD;  Location: [facility] OR;  Service: Open Heart Surgery;  Laterality: N/A;   CORONARY STENT PLACEMENT  2005 AND REAPLCED 2006, 2014   CYSTOSCOPY/RETROGRADE/URETEROSCOPY/STONE EXTRACTION WITH BASKET Bilateral 08/07/2020   Procedure: CYSTOSCOPY/RETROGRADE/URETEROSCOPY, HOLMIUM LASER LITHOTRIPSY /STONE EXTRACTION WITH BASKET/ LEFT STENT PLACEMENT;  Surgeon: Christina Coyer, MD;  Location: [facility];  Service: Urology;  Laterality: Bilateral;   CYSTOSCOPY/URETEROSCOPY/HOLMIUM LASER/STENT PLACEMENT Bilateral 05/29/2018  Procedure: CYSTOSCOPY/RETROGRADE/URETEROSCOPY/HOLMIUM LASER/STENT PLACEMENT/ BASKET STONE EXTRACTION;  Surgeon: Christina Coyer, MD;  Location: [facility];  Service: Urology;  Laterality: Bilateral;   EYE EXAMINATION UNDER ANESTHESIA W/ RETINAL CRYOTHERAPY AND RETINAL LASER Right 2018   HAND SURGERY Left 1990'S   hamick bone rem   HOLMIUM LASER APPLICATION Bilateral 08/07/2020   Procedure: HOLMIUM LASER APPLICATION;  Surgeon: Christina Coyer, MD;  Location: [facility];  Service: Urology;  Laterality: Bilateral;   KNEE ARTHROSCOPY Right 10/24/2014   Procedure: ARTHROSCOPY RIGHT KNEE, Partial medial menisectomy and chondroplasty;  Surgeon: Dayne Even, MD;  Location: [facility] OR;  Service:  Orthopedics;  Laterality: Right;   LEFT HEART CATH N/A 10/11/2012   Procedure: LEFT HEART CATH;  Surgeon: Arnoldo Lapping, MD;  Location: [facility] CATH LAB;  Service: Cardiovascular;  Laterality: N/A;   LITHOTRIPSY  EARLY 2000'S   X 2 OR 3   RADIAL ARTERY HARVEST Left 03/01/2016   Procedure: LEFT RADIAL ARTERY HARVEST;  Surgeon: Norita Beauvais, MD;  Location: [facility] OR;  Service: Open Heart Surgery;  Laterality: Left;   SPLIT NIGHT STUDY  09/21/2015   TEE WITHOUT CARDIOVERSION N/A 03/01/2016   Procedure: TRANSESOPHAGEAL ECHOCARDIOGRAM (TEE);  Surgeon: Norita Beauvais, MD;  Location: [facility] 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   Snoring 09/22/2015   Dyspnea on exertion 09/10/2014   Morbid obesity (HCC) 09/10/2014   Gastroesophageal reflux 03/03/2014   Elevated diaphragm 01/01/2013   Hyperlipidemia LDL goal <70 11/30/2012   Palpitations 11/30/2012   CAD S/P percutaneous coronary angioplasty/DES 10/12/2012   Sinus bradycardia 10/12/2012   Essential hypertension 10/12/2012   Bite, chigger 01/16/2007   Chromophytosis 01/16/2007   Blood glucose elevated 12/11/2006   Chest pain 12/08/2006   Adiposity 12/08/2006   Gain of weight 12/08/2006   Psychosexual dysfunction with inhibited sexual excitement 04/12/2006   Low back pain 07/14/2005   Carpal tunnel syndrome 07/07/2005   Anxiety disorder 06/22/2005   Osteoarthritis 01/21/2005   Healed  myocardial infarct 06/24/2003   Coronary atherosclerosis 06/23/2003    REFERRING PROVIDER: Lisette Ridgel MD  REFERRING DIAG: Medial meniscus tear s/p meniscectomy and chondroplasty  THERAPY DIAG:  Chronic pain of left knee  Stiffness of left knee, not elsewhere classified  Localized edema  Rationale for Evaluation and Treatment: Rehabilitation  ONSET DATE: "Last Summer."  SUBJECTIVE:   SUBJECTIVE STATEMENT: Some on and off pain over the weekend about a 4 especially when first getting up.  Pain not bad right now.  PERTINENT HISTORY: See above.  Right knee surgery. PAIN:  Are you having pain? Yes: NPRS scale: 2/10. Pain location: Left knee. Pain description: Ache. Aggravating factors: Increased up time. Relieving factors: Rest.  PRECAUTIONS: Other: Pain-free(no pain increase therex).    WEIGHT BEARING RESTRICTIONS: No  FALLS:  Has patient fallen in last 6 months? No  LIVING ENVIRONMENT: Lives with: lives with their spouse Lives in: House/apartment Stairs: Avoiding upstairs at this time.  Step down  into den accomplished with walker support. Has following equipment at home: Walker - 2 wheeled  PLOF: Independent  PATIENT GOALS: Get around better with less left knee pain.  Golf.  OBJECTIVE:  Note: Objective measures were completed at Evaluation unless otherwise noted.  PATIENT SURVEYS:  FOTO 35.5  EDEMA:  Circumferential: 1 cm greater on left than right.  PALPATION: Scope sites appear to be healing well.  He c/o some lateral left knee tenderness.  LOWER EXTREMITY ROM:  In supine:  Full left knee extension and flexion to 100 degrees.    LOWER EXTREMITY MMT:  The patient is easily able to perform a left SLR against gravity without extensor lag and SAQ.   GAIT: Safe gait pattern with a FWW with a decrease in step and stride length.                                                                                                                               TREATMENT DATE:   07/10/23:                                       EXERCISE LOG  Exercise Repetitions and Resistance Comments  Nustep  Level 3 x 10 minutes    Recumbent Level 3 x 10 minutes   Knee ext 10# x 3 minutes   Ham curls 30# x 3 minutes   Leg Press 2 plates x 3 minutes   Rockerboard  In parallel bars for neuro re-edu x 5 minutes   Inverted BOSU ball 4 minutes in parallel x 4 minutes     07/05/23:                                     EXERCISE LOG  Exercise Repetitions and Resistance Comments  Nustep Level 4 x 10 minutes   Recumbent bike Level 3 x 10 minutes   Knee ext 10# 3 minutes   Ham curls 30# x 3 minutes        LE elevation and vasopneumatic x 20 minutes to patient's left knee.                                        07/03/23 EXERCISE LOG  Exercise Repetitions and Resistance Comments  Nustep  L4 x 15 minutes; seat 9    Rocker board  5 minutes For gastrocnemius/soleus soft tissue extensibility  Step up 6" step x 2 minutes  With 1 railing to simulate household mobility  Eccentric heel tap  6" step x 2 minutes  LLE on step; for improved eccentric quadriceps control for descending stairs  Weighted lumbar rotation on foam pad  4# x 1.5 minutes each  For stability need to return to golf   Lunges onto BOSU 2 minutes each   Sit to stand  10 reps Without UE support and foam pad under feet    Blank cell = exercise not performed today                                    06/28/23 EXERCISE LOG   Exercise Repetitions and Resistance Comments  Nustep  L4 x 16 minutes seat 11 1 mile  Rocker board  6 minutes  DF/PF balance   Eccentric step down   LLE only   Step up 6 in x 10   LAQ 5#  3x 10 reps   pause at top 3-5 secs  BLE   Standing HS curl  5# x 20 reps each  Alternating LE  Tandem stance on foam   Without UE support   Blank cell = exercise not performed today  Vaso: Knee, 34 degrees; low pressure, 15 mins, Pain and Edema     PATIENT EDUCATION:  Education details:  returning to golf and plan of care Person educated: Patient Education method: Explanation Education comprehension: verbalized understanding  HOME EXERCISE PROGRAM:   ASSESSMENT:  CLINICAL IMPRESSION: See below.  OBJECTIVE IMPAIRMENTS: Abnormal gait, decreased activity tolerance, decreased ROM, increased edema, and pain.   ACTIVITY LIMITATIONS: carrying, lifting, bending, stairs, and locomotion level  PARTICIPATION LIMITATIONS: meal prep, cleaning, laundry, shopping, and community activity  PERSONAL FACTORS: Time since onset of injury/illness/exacerbation are also affecting patient's functional outcome.   REHAB POTENTIAL: Excellent  CLINICAL DECISION MAKING: Stable/uncomplicated  EVALUATION COMPLEXITY: Low   GOALS: LONG TERM GOALS: Target date: 07/20/23.  Ind with a HEP.  Goal status: IN PROGRESS  2.  Active left knee flexion to 115 degrees+ so the patient can perform functional tasks and do so with pain not > 2-3/10.   1/23: 123 degrees Goal status: MET  3.  Perform a reciprocating stair gait with one railing with pain not > 2-3/10.  Goal status: IN PROGRESS  4.  Walk a community distance without assistive device and left knee pain not > 2-3/10.  Goal status: MET  5.  Perform ADL's with pain not > 3/10. Goal status: MET  PLAN:  PT FREQUENCY: 2x/week  PT DURATION: 6 weeks  PLANNED INTERVENTIONS: 97110-Therapeutic exercises, 97530- Therapeutic activity, [id number]- Neuromuscular re-education, 97535- Self Care, 16109- Manual therapy, 97014- Electrical stimulation (unattended), 97016- Vasopneumatic device, Patient/Family education, Cryotherapy, and Moist heat  PLAN FOR NEXT SESSION: Nustep, Pain-free left LE therex, Gait activities, modalities as needed.    Progress Note Reporting Period 06/08/23 to 07/10/23:  See note below for Objective Data and Assessment of Progress/Goals. Patient is making excellent progress toward goals as above.  He has two visits remaining.         Martha Soltys, [place], PT 07/10/2023, 11:01 AM

## 2023-07-13 ENCOUNTER — Ambulatory Visit: Payer: No Typology Code available for payment source

## 2023-07-13 DIAGNOSIS — M25562 Pain in left knee: Secondary | ICD-10-CM | POA: Diagnosis not present

## 2023-07-13 DIAGNOSIS — G8929 Other chronic pain: Secondary | ICD-10-CM

## 2023-07-13 DIAGNOSIS — R600 Localized edema: Secondary | ICD-10-CM

## 2023-07-13 DIAGNOSIS — M25662 Stiffness of left knee, not elsewhere classified: Secondary | ICD-10-CM

## 2023-07-13 NOTE — Therapy (Signed)
OUTPATIENT PHYSICAL THERAPY LOWER EXTREMITY TREATMENT   Patient Name: Hayden Rivera MRN: [id number] DOB:Oct 04, 1950, 73 y.o., male Today's Date: 07/13/2023  END OF SESSION:  PT End of Session - 07/13/23 1020     Visit Number 11    Number of Visits 12    Date for PT Re-Evaluation 07/20/23    PT Start Time 1015    PT Stop Time 1057    PT Time Calculation (min) 42 min    Activity Tolerance Patient tolerated treatment well    Behavior During Therapy [facility] for tasks assessed/performed                 Past Medical History:  Diagnosis Date   Anginal pain (HCC)    occ   Aortic stenosis 06/16/2017   Mild, noted on ECHO   Arthritis    Atrial fibrillation (HCC) 02/2016   post op   CAD (coronary artery disease)    a. Anterior STEMI 2005 c/b vfib arrest, PCI to LAD at [facility]. b. Stent thrombosis 2006 with DES within prior LAD stent at [facility]. C. 09/2012: s/p balloon angioplasty to mLAD for severe stenosis in previously stented segment & DES to LCx; initial enz neg but ruled in for NSTEMI after post-cath vagal sx, felt d/t distal emboliz of thrombus during case.   Cancer (HCC)    skin ca melanoma, pt states was removed from back   Chronic back pain    Dependent edema    Mild, occ NONE RECENT   Diarrhea    DM type 2 (diabetes mellitus, type 2) (HCC)    diet controlled   Elevated hemidiaphragm    a. Noted 09/2012 - instructed to f/u PCP.   Heart murmur    History of kidney stones    Hyperlipidemia    Hypertension    Ischemic cardiomyopathy    a. Unclear prior EF but pt was told heart was weakened in past. b. EF normal 09/2012.   Myocardial infarction (HCC) 2005   TOTAL MI'S 4 LAST ONE 2017   Retinal tear, right    Ventricular fibrillation (HCC)    a. VF arrest 2005 in setting of STEMI.   Wears glasses    Wears glasses    for reading   Past Surgical History:  Procedure Laterality Date   BACK SURGERY  1997   UPPER CERVICAL C 3 TO C 4 FUSION   CARDIAC CATHETERIZATION      stents x2   CARDIAC CATHETERIZATION N/A 10/02/2014   Procedure: Left Heart Cath And Coronary Angiography;  Surgeon: Laurey Morale, MD;  Location: [facility] INVASIVE CV LAB CUPID;  Service: Cardiovascular;  Laterality: N/A;   CARDIAC CATHETERIZATION N/A 02/17/2016   Procedure: Left Heart Cath and Coronary Angiography;  Surgeon: Laurey Morale, MD;  Location: [facility] INVASIVE CV LAB;  Service: Cardiovascular;  Laterality: N/A;   CORONARY ARTERY BYPASS GRAFT N/A 03/01/2016   Procedure: CORONARY ARTERY BYPASS GRAFTING (CABG)x3 with endoscopic harvesting of right saphenous vein -LIMA to LAD -SVG to LEFT CIRCUMFLEX -RADIAL ARTERY to RAMUS INTERMEDIA;  Surgeon: Delight Ovens, MD;  Location: [facility] OR;  Service: Open Heart Surgery;  Laterality: N/A;   CORONARY STENT PLACEMENT  2005 AND REAPLCED 2006, 2014   CYSTOSCOPY/RETROGRADE/URETEROSCOPY/STONE EXTRACTION WITH BASKET Bilateral 08/07/2020   Procedure: CYSTOSCOPY/RETROGRADE/URETEROSCOPY, HOLMIUM LASER LITHOTRIPSY /STONE EXTRACTION WITH BASKET/ LEFT STENT PLACEMENT;  Surgeon: Jerilee Field, MD;  Location: [facility];  Service: Urology;  Laterality: Bilateral;   CYSTOSCOPY/URETEROSCOPY/HOLMIUM LASER/STENT PLACEMENT Bilateral 05/29/2018  Procedure: CYSTOSCOPY/RETROGRADE/URETEROSCOPY/HOLMIUM LASER/STENT PLACEMENT/ BASKET STONE EXTRACTION;  Surgeon: Jerilee Field, MD;  Location: [facility];  Service: Urology;  Laterality: Bilateral;   EYE EXAMINATION UNDER ANESTHESIA W/ RETINAL CRYOTHERAPY AND RETINAL LASER Right 2018   HAND SURGERY Left 1990'S   hamick bone rem   HOLMIUM LASER APPLICATION Bilateral 08/07/2020   Procedure: HOLMIUM LASER APPLICATION;  Surgeon: Jerilee Field, MD;  Location: [facility];  Service: Urology;  Laterality: Bilateral;   KNEE ARTHROSCOPY Right 10/24/2014   Procedure: ARTHROSCOPY RIGHT KNEE, Partial medial menisectomy and chondroplasty;  Surgeon: Marcene Corning, MD;  Location: [facility] OR;  Service:  Orthopedics;  Laterality: Right;   LEFT HEART CATH N/A 10/11/2012   Procedure: LEFT HEART CATH;  Surgeon: Tonny Bollman, MD;  Location: [facility] CATH LAB;  Service: Cardiovascular;  Laterality: N/A;   LITHOTRIPSY  EARLY 2000'S   X 2 OR 3   RADIAL ARTERY HARVEST Left 03/01/2016   Procedure: LEFT RADIAL ARTERY HARVEST;  Surgeon: Delight Ovens, MD;  Location: [facility] OR;  Service: Open Heart Surgery;  Laterality: Left;   SPLIT NIGHT STUDY  09/21/2015   TEE WITHOUT CARDIOVERSION N/A 03/01/2016   Procedure: TRANSESOPHAGEAL ECHOCARDIOGRAM (TEE);  Surgeon: Delight Ovens, MD;  Location: [facility] 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   Snoring 09/22/2015   Dyspnea on exertion 09/10/2014   Morbid obesity (HCC) 09/10/2014   Gastroesophageal reflux 03/03/2014   Elevated diaphragm 01/01/2013   Hyperlipidemia LDL goal <70 11/30/2012   Palpitations 11/30/2012   CAD S/P percutaneous coronary angioplasty/DES 10/12/2012   Sinus bradycardia 10/12/2012   Essential hypertension 10/12/2012   Bite, chigger 01/16/2007   Chromophytosis 01/16/2007   Blood glucose elevated 12/11/2006   Chest pain 12/08/2006   Adiposity 12/08/2006   Gain of weight 12/08/2006   Psychosexual dysfunction with inhibited sexual excitement 04/12/2006   Low back pain 07/14/2005   Carpal tunnel syndrome 07/07/2005   Anxiety disorder 06/22/2005   Osteoarthritis 01/21/2005   Healed  myocardial infarct 06/24/2003   Coronary atherosclerosis 06/23/2003    REFERRING PROVIDER: Augusto Gamble MD  REFERRING DIAG: Medial meniscus tear s/p meniscectomy and chondroplasty  THERAPY DIAG:  Chronic pain of left knee  Stiffness of left knee, not elsewhere classified  Localized edema  Rationale for Evaluation and Treatment: Rehabilitation  ONSET DATE: "Last Summer."  SUBJECTIVE:   SUBJECTIVE STATEMENT: Patient reports that his knee feels fine. He is not hurting or aching any today. He was able to do a lot of walking yesterday which was tiring and sore, but it went away when he rested a little bit.   PERTINENT HISTORY: See above.  Right knee surgery. PAIN:  Are you having pain? Yes: NPRS scale: 0/10. Pain location: Left knee. Pain description: Ache. Aggravating factors: Increased up time. Relieving factors: Rest.  PRECAUTIONS: Other: Pain-free(no pain increase therex).    WEIGHT BEARING RESTRICTIONS: No  FALLS:  Has patient fallen in last 6 months? No  LIVING  ENVIRONMENT: Lives with: lives with their spouse Lives in: House/apartment Stairs: Avoiding upstairs at this time.  Step down into den accomplished with walker support. Has following equipment at home: Walker - 2 wheeled  PLOF: Independent  PATIENT GOALS: Get around better with less left knee pain.  Golf.  OBJECTIVE:  Note: Objective measures were completed at Evaluation unless otherwise noted.  PATIENT SURVEYS:  FOTO 35.5  EDEMA:  Circumferential: 1 cm greater on left than right.  PALPATION: Scope sites appear to be healing well.  He c/o some lateral left knee tenderness.  LOWER EXTREMITY ROM:  In supine:  Full left knee extension and flexion to 100 degrees.    LOWER EXTREMITY MMT:  The patient is easily able to perform a left SLR against gravity without extensor lag and SAQ.   GAIT: Safe gait pattern with a FWW with a decrease in step and stride length.                                                                                                                               TREATMENT DATE:                                     EXERCISE LOG  Exercise Repetitions and Resistance Comments  Recumbent bike  L3 x 15 minutes   Eccentric step down  6" step x 20 reps each for eccentric quadriceps control to descend stairs  Rocker board  5 minutes    Cybex knee flexion  40# x 2.5 minutes   Cybex knee extension  20# x 2.5 minutes    Cybex leg press 2 plates x 20 reps @ seat 7    Blank cell = exercise not performed today   07/10/23: EXERCISE LOG  Exercise Repetitions and Resistance Comments  Nustep  Level 3 x 10 minutes    Recumbent Level 3 x 10 minutes   Knee ext 10# x 3 minutes   Ham curls 30# x 3 minutes   Leg Press 2 plates x 3 minutes   Rockerboard  In parallel bars for neuro re-edu x 5 minutes   Inverted BOSU ball 4 minutes in parallel x 4 minutes     07/05/23: EXERCISE LOG  Exercise Repetitions and Resistance Comments  Nustep Level 4 x 10 minutes   Recumbent bike Level 3 x 10 minutes   Knee ext 10# 3 minutes   Ham curls 30# x 3 minutes        LE elevation and vasopneumatic x 20 minutes to patient's left knee.    PATIENT EDUCATION:  Education details: plan of care and progress with physical therapy Person educated: Patient Education method: Explanation Education comprehension: verbalized understanding  HOME EXERCISE PROGRAM:   ASSESSMENT:  CLINICAL IMPRESSION: Patient was introduced to eccentric heel taps with moderate difficulty for improved eccentric quadriceps control needed to safely descend  stairs. He required minimal cueing with eccentric heel taps for initial contact on the floor to facilitate quadriceps engagement. He experienced no significant increase in pain or discomfort with any of today's interventions. He reported that his knee felt good upon the conclusion of treatment. He continues to require skilled physical therapy to address his  remaining impairments to return to his prior level of function.    OBJECTIVE IMPAIRMENTS: Abnormal gait, decreased activity tolerance, decreased ROM, increased edema, and pain.   ACTIVITY LIMITATIONS: carrying, lifting, bending, stairs, and locomotion level  PARTICIPATION LIMITATIONS: meal prep, cleaning, laundry, shopping, and community activity  PERSONAL FACTORS: Time since onset of injury/illness/exacerbation are also affecting patient's functional outcome.   REHAB POTENTIAL: Excellent  CLINICAL DECISION MAKING: Stable/uncomplicated  EVALUATION COMPLEXITY: Low   GOALS: LONG TERM GOALS: Target date: 07/20/23.  Ind with a HEP.  Goal status: IN PROGRESS  2.  Active left knee flexion to 115 degrees+ so the patient can perform functional tasks and do so with pain not > 2-3/10.   1/23: 123 degrees Goal status: MET  3.  Perform a reciprocating stair gait with one railing with pain not > 2-3/10.  Goal status: IN PROGRESS  4.  Walk a community distance without assistive device and left knee pain not > 2-3/10.  Goal status: MET  5.  Perform ADL's with pain not > 3/10. Goal status: MET  PLAN:  PT FREQUENCY: 2x/week  PT DURATION: 6 weeks  PLANNED INTERVENTIONS: 97110-Therapeutic exercises, 97530- Therapeutic activity, [id number]- Neuromuscular re-education, 97535- Self Care, [id number]- Manual therapy, 97014- Electrical stimulation (unattended), 97016- Vasopneumatic device, Patient/Family education, Cryotherapy, and Moist heat  PLAN FOR NEXT SESSION: Nustep, Pain-free left LE therex, Gait activities, modalities as needed.     Granville Lewis, PT 07/13/2023, 11:00 AM

## 2023-07-17 ENCOUNTER — Ambulatory Visit: Payer: No Typology Code available for payment source

## 2023-07-17 DIAGNOSIS — M25662 Stiffness of left knee, not elsewhere classified: Secondary | ICD-10-CM

## 2023-07-17 DIAGNOSIS — G8929 Other chronic pain: Secondary | ICD-10-CM

## 2023-07-17 DIAGNOSIS — M25562 Pain in left knee: Secondary | ICD-10-CM | POA: Diagnosis not present

## 2023-07-17 DIAGNOSIS — R600 Localized edema: Secondary | ICD-10-CM

## 2023-07-17 NOTE — Therapy (Addendum)
 OUTPATIENT PHYSICAL THERAPY LOWER EXTREMITY TREATMENT   Patient Name: Hayden Rivera MRN: [id number] DOB:August 27, 1950, 73 y.o., male Today's Date: 07/17/2023  END OF SESSION:  PT End of Session - 07/17/23 1019     Visit Number 12    Number of Visits 12    Date for PT Re-Evaluation 07/20/23    PT Start Time 1015    Activity Tolerance Patient tolerated treatment well    Behavior During Therapy [facility] for tasks assessed/performed                 Past Medical History:  Diagnosis Date   Anginal pain (HCC)    occ   Aortic stenosis 06/16/2017   Mild, noted on ECHO   Arthritis    Atrial fibrillation (HCC) 02/2016   post op   CAD (coronary artery disease)    a. Anterior STEMI 2005 c/b vfib arrest, PCI to LAD at [facility]. b. Stent thrombosis 2006 with DES within prior LAD stent at [facility] C. 09/2012: s/p balloon angioplasty to mLAD for severe stenosis in previously stented segment & DES to LCx; initial enz neg but ruled in for NSTEMI after post-cath vagal sx, felt d/t distal emboliz of thrombus during case.   Cancer (HCC)    skin ca melanoma, pt states was removed from back   Chronic back pain    Dependent edema    Mild, occ NONE RECENT   Diarrhea    DM type 2 (diabetes mellitus, type 2) (HCC)    diet controlled   Elevated hemidiaphragm    a. Noted 09/2012 - instructed to f/u PCP.   Heart murmur    History of kidney stones    Hyperlipidemia    Hypertension    Ischemic cardiomyopathy    a. Unclear prior EF but pt was told heart was weakened in past. b. EF normal 09/2012.   Myocardial infarction (HCC) 2005   TOTAL MI'S 4 LAST ONE 2017   Retinal tear, right    Ventricular fibrillation (HCC)    a. VF arrest 2005 in setting of STEMI.   Wears glasses    Wears glasses    for reading   Past Surgical History:  Procedure Laterality Date   BACK SURGERY  1997   UPPER CERVICAL C 3 TO C 4 FUSION   CARDIAC CATHETERIZATION     stents x2   CARDIAC CATHETERIZATION N/A 10/02/2014    Procedure: Left Heart Cath And Coronary Angiography;  Surgeon: Laurey Morale, MD;  Location: [facility] INVASIVE CV LAB CUPID;  Service: Cardiovascular;  Laterality: N/A;   CARDIAC CATHETERIZATION N/A 02/17/2016   Procedure: Left Heart Cath and Coronary Angiography;  Surgeon: Laurey Morale, MD;  Location: [facility] INVASIVE CV LAB;  Service: Cardiovascular;  Laterality: N/A;   CORONARY ARTERY BYPASS GRAFT N/A 03/01/2016   Procedure: CORONARY ARTERY BYPASS GRAFTING (CABG)x3 with endoscopic harvesting of right saphenous vein -LIMA to LAD -SVG to LEFT CIRCUMFLEX -RADIAL ARTERY to RAMUS INTERMEDIA;  Surgeon: Delight Ovens, MD;  Location: [facility] OR;  Service: Open Heart Surgery;  Laterality: N/A;   CORONARY STENT PLACEMENT  2005 AND REAPLCED 2006, 2014   CYSTOSCOPY/RETROGRADE/URETEROSCOPY/STONE EXTRACTION WITH BASKET Bilateral 08/07/2020   Procedure: CYSTOSCOPY/RETROGRADE/URETEROSCOPY, HOLMIUM LASER LITHOTRIPSY /STONE EXTRACTION WITH BASKET/ LEFT STENT PLACEMENT;  Surgeon: Jerilee Field, MD;  Location: [facility];  Service: Urology;  Laterality: Bilateral;   CYSTOSCOPY/URETEROSCOPY/HOLMIUM LASER/STENT PLACEMENT Bilateral 05/29/2018   Procedure: CYSTOSCOPY/RETROGRADE/URETEROSCOPY/HOLMIUM LASER/STENT PLACEMENT/ BASKET STONE EXTRACTION;  Surgeon: Jerilee Field, MD;  Location: [facility];  Service: Urology;  Laterality: Bilateral;   EYE EXAMINATION UNDER ANESTHESIA W/ RETINAL CRYOTHERAPY AND RETINAL LASER Right 2018   HAND SURGERY Left 1990'S   hamick bone rem   HOLMIUM LASER APPLICATION Bilateral 08/07/2020   Procedure: HOLMIUM LASER APPLICATION;  Surgeon: Jerilee Field, MD;  Location: [facility];  Service: Urology;  Laterality: Bilateral;   KNEE ARTHROSCOPY Right 10/24/2014   Procedure: ARTHROSCOPY RIGHT KNEE, Partial medial menisectomy and chondroplasty;  Surgeon: Marcene Corning, MD;  Location: [facility] OR;  Service: Orthopedics;  Laterality: Right;   LEFT HEART CATH  N/A 10/11/2012   Procedure: LEFT HEART CATH;  Surgeon: Tonny Bollman, MD;  Location: [facility] CATH LAB;  Service: Cardiovascular;  Laterality: N/A;   LITHOTRIPSY  EARLY 2000'S   X 2 OR 3   RADIAL ARTERY HARVEST Left 03/01/2016   Procedure: LEFT RADIAL ARTERY HARVEST;  Surgeon: Delight Ovens, MD;  Location: [facility] OR;  Service: Open Heart Surgery;  Laterality: Left;   SPLIT NIGHT STUDY  09/21/2015   TEE WITHOUT CARDIOVERSION N/A 03/01/2016   Procedure: TRANSESOPHAGEAL ECHOCARDIOGRAM (TEE);  Surgeon: Delight Ovens, MD;  Location: [facility] 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   Snoring 09/22/2015   Dyspnea on exertion 09/10/2014   Morbid obesity (HCC) 09/10/2014   Gastroesophageal reflux 03/03/2014   Elevated diaphragm 01/01/2013   Hyperlipidemia LDL goal <70 11/30/2012   Palpitations 11/30/2012   CAD S/P percutaneous coronary angioplasty/DES 10/12/2012   Sinus bradycardia 10/12/2012   Essential hypertension 10/12/2012   Bite, chigger 01/16/2007   Chromophytosis 01/16/2007   Blood glucose elevated 12/11/2006   Chest pain 12/08/2006   Adiposity 12/08/2006   Gain of weight 12/08/2006   Psychosexual dysfunction with inhibited sexual excitement 04/12/2006   Low back pain 07/14/2005   Carpal tunnel syndrome 07/07/2005   Anxiety disorder 06/22/2005   Osteoarthritis 01/21/2005   Healed myocardial infarct 06/24/2003   Coronary  atherosclerosis 06/23/2003    REFERRING PROVIDER: Augusto Gamble MD  REFERRING DIAG: Medial meniscus tear s/p meniscectomy and chondroplasty  THERAPY DIAG:  Chronic pain of left knee  Stiffness of left knee, not elsewhere classified  Localized edema  Rationale for Evaluation and Treatment: Rehabilitation  ONSET DATE: "Last Summer."  SUBJECTIVE:   SUBJECTIVE STATEMENT: Pt reports that his knee is hurting a little today, but not too bad.   PERTINENT HISTORY: See above.  Right knee surgery. PAIN:  Are you having pain? Yes: NPRS scale: 2/10. Pain location: Left knee. Pain description: Ache. Aggravating factors: Increased up time. Relieving factors: Rest.  PRECAUTIONS: Other: Pain-free(no pain increase therex).    WEIGHT BEARING RESTRICTIONS: No  FALLS:  Has patient fallen in last 6 months? No  LIVING ENVIRONMENT: Lives with: lives with their spouse Lives in: House/apartment Stairs: Avoiding upstairs at this time.  Step down into den accomplished with walker support. Has following equipment at home: Dan Humphreys - 2 wheeled  PLOF: Independent  PATIENT GOALS: Get  around better with less left knee pain.  Golf.  OBJECTIVE:  Note: Objective measures were completed at Evaluation unless otherwise noted.  PATIENT SURVEYS:  FOTO 35.5  EDEMA:  Circumferential: 1 cm greater on left than right.  PALPATION: Scope sites appear to be healing well.  He c/o some lateral left knee tenderness.  LOWER EXTREMITY ROM:  In supine:  Full left knee extension and flexion to 100 degrees.    LOWER EXTREMITY MMT:  The patient is easily able to perform a left SLR against gravity without extensor lag and SAQ.   GAIT: Safe gait pattern with a FWW with a decrease in step and stride length.                                                                                                                              TREATMENT DATE:                07/17/23                     EXERCISE  LOG  Exercise Repetitions and Resistance Comments  Recumbent bike  L3 x 15 minutes   Eccentric step down     Rocker board  5 minutes    Cybex knee flexion  40# x 4 minutes   Cybex knee extension  20# x 4 minutes    Cybex leg press 2 plates; seat 8 x 3 mins    Blank cell = exercise not performed today   07/10/23: EXERCISE LOG  Exercise Repetitions and Resistance Comments  Nustep  Level 3 x 10 minutes    Recumbent Level 3 x 10 minutes   Knee ext 10# x 3 minutes   Ham curls 30# x 3 minutes   Leg Press 2 plates x 3 minutes   Rockerboard  In parallel bars for neuro re-edu x 5 minutes   Inverted BOSU ball 4 minutes in parallel x 4 minutes     07/05/23: EXERCISE LOG  Exercise Repetitions and Resistance Comments  Nustep Level 4 x 10 minutes   Recumbent bike Level 3 x 10 minutes   Knee ext 10# 3 minutes   Ham curls 30# x 3 minutes        LE elevation and vasopneumatic x 20 minutes to patient's left knee.    PATIENT EDUCATION:  Education details: plan of care and progress with physical therapy Person educated: Patient Education method: Explanation Education comprehension: verbalized understanding  HOME EXERCISE PROGRAM:   ASSESSMENT:  CLINICAL IMPRESSION: Pt arrives for today's treatment reporting 2/10 left knee pain.  Pt able to navigate four stairs with reciprocal pattern and use of one handrail.  Pt able to tolerate increased time with all cybex exercises today.  Pt given print out of weights performed on cybex machinery for gym usage.  Pt has met all the goals set forth for him by physical therapy at this time.  Pt encouraged to  call the facility with any questions or concerns.  Pt denied any pain at completion of today's treatment session.  Pt ready for discharge at this time.    OBJECTIVE IMPAIRMENTS: Abnormal gait, decreased activity tolerance, decreased ROM, increased edema, and pain.   ACTIVITY LIMITATIONS: carrying, lifting, bending, stairs, and locomotion  level  PARTICIPATION LIMITATIONS: meal prep, cleaning, laundry, shopping, and community activity  PERSONAL FACTORS: Time since onset of injury/illness/exacerbation are also affecting patient's functional outcome.   REHAB POTENTIAL: Excellent  CLINICAL DECISION MAKING: Stable/uncomplicated  EVALUATION COMPLEXITY: Low   GOALS: LONG TERM GOALS: Target date: 07/20/23.  Ind with a HEP.  Goal status: MET  2.  Active left knee flexion to 115 degrees+ so the patient can perform functional tasks and do so with pain not > 2-3/10.   1/23: 123 degrees Goal status: MET  3.  Perform a reciprocating stair gait with one railing with pain not > 2-3/10.  Goal status: MET  4.  Walk a community distance without assistive device and left knee pain not > 2-3/10.  Goal status: MET  5.  Perform ADL's with pain not > 3/10. Goal status: MET  PLAN:  PT FREQUENCY: 2x/week  PT DURATION: 6 weeks  PLANNED INTERVENTIONS: 97110-Therapeutic exercises, 97530- Therapeutic activity, [id number]- Neuromuscular re-education, 97535- Self Care, 16109- Manual therapy, 97014- Electrical stimulation (unattended), 97016- Vasopneumatic device, Patient/Family education, Cryotherapy, and Moist heat  PLAN FOR NEXT SESSION: Nustep, Pain-free left LE therex, Gait activities, modalities as needed.     Newman Pies, PTA 07/17/2023, 10:19 AM   PHYSICAL THERAPY DISCHARGE SUMMARY  Visits from Start of Care: 12.  Current functional level related to goals / functional outcomes: See above.   Remaining deficits: All goals met.   Education / Equipment: HEP.   Patient agrees to discharge. Patient goals were met. Patient is being discharged due to meeting the stated rehab goals.    Italy Applegate MPT

## 2023-08-09 ENCOUNTER — Other Ambulatory Visit: Payer: Self-pay | Admitting: Cardiology

## 2023-11-11 ENCOUNTER — Other Ambulatory Visit: Payer: Self-pay | Admitting: Cardiology

## 2023-11-27 DIAGNOSIS — L570 Actinic keratosis: Secondary | ICD-10-CM | POA: Diagnosis not present

## 2023-11-27 DIAGNOSIS — Z85828 Personal history of other malignant neoplasm of skin: Secondary | ICD-10-CM | POA: Diagnosis not present

## 2023-11-27 DIAGNOSIS — Z85820 Personal history of malignant melanoma of skin: Secondary | ICD-10-CM | POA: Diagnosis not present

## 2023-11-27 DIAGNOSIS — L821 Other seborrheic keratosis: Secondary | ICD-10-CM | POA: Diagnosis not present

## 2023-12-04 ENCOUNTER — Other Ambulatory Visit: Payer: Self-pay | Admitting: Cardiology

## 2024-01-03 ENCOUNTER — Other Ambulatory Visit: Payer: Self-pay | Admitting: Cardiology

## 2024-02-24 ENCOUNTER — Other Ambulatory Visit: Payer: Self-pay | Admitting: Cardiology

## 2024-04-10 ENCOUNTER — Ambulatory Visit: Admitting: Cardiology

## 2024-05-07 DIAGNOSIS — L570 Actinic keratosis: Secondary | ICD-10-CM | POA: Diagnosis not present

## 2024-05-07 DIAGNOSIS — D485 Neoplasm of uncertain behavior of skin: Secondary | ICD-10-CM | POA: Diagnosis not present

## 2024-05-29 ENCOUNTER — Other Ambulatory Visit: Payer: Self-pay | Admitting: Cardiology
